# Patient Record
Sex: Female | Born: 1955
Health system: Southern US, Community
[De-identification: ages and names within clinical notes are randomized; demographics above are authoritative.]

## PROBLEM LIST (undated history)

## (undated) DIAGNOSIS — J449 Chronic obstructive pulmonary disease, unspecified: Secondary | ICD-10-CM

## (undated) DIAGNOSIS — F32A Depression, unspecified: Secondary | ICD-10-CM

## (undated) DIAGNOSIS — E538 Deficiency of other specified B group vitamins: Secondary | ICD-10-CM

## (undated) DIAGNOSIS — I10 Essential (primary) hypertension: Secondary | ICD-10-CM

## (undated) DIAGNOSIS — D696 Thrombocytopenia, unspecified: Secondary | ICD-10-CM

## (undated) DIAGNOSIS — I6522 Occlusion and stenosis of left carotid artery: Secondary | ICD-10-CM

## (undated) DIAGNOSIS — I739 Peripheral vascular disease, unspecified: Secondary | ICD-10-CM

## (undated) DIAGNOSIS — K635 Polyp of colon: Secondary | ICD-10-CM

## (undated) DIAGNOSIS — Z720 Tobacco use: Secondary | ICD-10-CM

## (undated) DIAGNOSIS — L409 Psoriasis, unspecified: Secondary | ICD-10-CM

## (undated) DIAGNOSIS — M1990 Unspecified osteoarthritis, unspecified site: Secondary | ICD-10-CM

## (undated) DIAGNOSIS — R0600 Dyspnea, unspecified: Secondary | ICD-10-CM

## (undated) DIAGNOSIS — E785 Hyperlipidemia, unspecified: Secondary | ICD-10-CM

## (undated) DIAGNOSIS — IMO0002 Reserved for concepts with insufficient information to code with codable children: Secondary | ICD-10-CM

## (undated) DIAGNOSIS — R7881 Bacteremia: Secondary | ICD-10-CM

## (undated) DIAGNOSIS — F329 Major depressive disorder, single episode, unspecified: Secondary | ICD-10-CM

## (undated) DIAGNOSIS — M519 Unspecified thoracic, thoracolumbar and lumbosacral intervertebral disc disorder: Secondary | ICD-10-CM

## (undated) HISTORY — DX: Peripheral vascular disease, unspecified: I73.9

## (undated) HISTORY — DX: Hyperlipidemia, unspecified: E78.5

## (undated) HISTORY — DX: Polyp of colon: K63.5

## (undated) HISTORY — DX: Unspecified thoracic, thoracolumbar and lumbosacral intervertebral disc disorder: M51.9

## (undated) HISTORY — DX: Reserved for concepts with insufficient information to code with codable children: IMO0002

## (undated) HISTORY — DX: Major depressive disorder, single episode, unspecified: F32.9

## (undated) HISTORY — DX: Thrombocytopenia, unspecified: D69.6

## (undated) HISTORY — DX: Tobacco use: Z72.0

## (undated) HISTORY — DX: Depression, unspecified: F32.A

## (undated) HISTORY — DX: Essential (primary) hypertension: I10

## (undated) HISTORY — DX: Chronic obstructive pulmonary disease, unspecified: J44.9

## (undated) HISTORY — DX: Deficiency of other specified B group vitamins: E53.8

## (undated) HISTORY — DX: Occlusion and stenosis of left carotid artery: I65.22

## (undated) HISTORY — DX: Morbid (severe) obesity due to excess calories: E66.01

---

## 1980-03-21 HISTORY — PX: CHOLECYSTECTOMY: SHX55

## 1987-03-22 HISTORY — PX: BACK SURGERY: SHX140

## 1999-03-22 HISTORY — PX: HERNIA REPAIR: SHX51

## 1999-06-03 ENCOUNTER — Inpatient Hospital Stay (HOSPITAL_COMMUNITY): Admission: EM | Admit: 1999-06-03 | Discharge: 1999-06-06 | Payer: Self-pay | Admitting: Emergency Medicine

## 1999-06-03 ENCOUNTER — Encounter: Payer: Self-pay | Admitting: Emergency Medicine

## 1999-06-04 ENCOUNTER — Encounter: Payer: Self-pay | Admitting: Surgery

## 1999-06-05 ENCOUNTER — Encounter: Payer: Self-pay | Admitting: Surgery

## 1999-06-14 ENCOUNTER — Encounter: Admission: RE | Admit: 1999-06-14 | Discharge: 1999-09-12 | Payer: Self-pay | Admitting: Family Medicine

## 1999-06-24 ENCOUNTER — Observation Stay (HOSPITAL_COMMUNITY): Admission: RE | Admit: 1999-06-24 | Discharge: 1999-06-27 | Payer: Self-pay | Admitting: Surgery

## 1999-08-24 ENCOUNTER — Inpatient Hospital Stay (HOSPITAL_COMMUNITY): Admission: EM | Admit: 1999-08-24 | Discharge: 1999-08-29 | Payer: Self-pay | Admitting: Surgery

## 1999-10-14 ENCOUNTER — Other Ambulatory Visit: Admission: RE | Admit: 1999-10-14 | Discharge: 1999-10-14 | Payer: Self-pay | Admitting: *Deleted

## 2001-08-17 ENCOUNTER — Other Ambulatory Visit: Admission: RE | Admit: 2001-08-17 | Discharge: 2001-08-17 | Payer: Self-pay | Admitting: *Deleted

## 2002-01-04 ENCOUNTER — Emergency Department (HOSPITAL_COMMUNITY): Admission: EM | Admit: 2002-01-04 | Discharge: 2002-01-04 | Payer: Self-pay | Admitting: Emergency Medicine

## 2002-01-05 ENCOUNTER — Encounter: Payer: Self-pay | Admitting: Emergency Medicine

## 2002-11-01 ENCOUNTER — Other Ambulatory Visit: Admission: RE | Admit: 2002-11-01 | Discharge: 2002-11-01 | Payer: Self-pay | Admitting: Family Medicine

## 2004-02-19 DIAGNOSIS — K635 Polyp of colon: Secondary | ICD-10-CM

## 2004-02-19 HISTORY — DX: Polyp of colon: K63.5

## 2004-02-20 ENCOUNTER — Encounter (INDEPENDENT_AMBULATORY_CARE_PROVIDER_SITE_OTHER): Payer: Self-pay | Admitting: *Deleted

## 2004-02-20 ENCOUNTER — Ambulatory Visit (HOSPITAL_COMMUNITY): Admission: RE | Admit: 2004-02-20 | Discharge: 2004-02-20 | Payer: Self-pay | Admitting: Gastroenterology

## 2004-06-25 ENCOUNTER — Ambulatory Visit (HOSPITAL_COMMUNITY): Admission: RE | Admit: 2004-06-25 | Discharge: 2004-06-25 | Payer: Self-pay | Admitting: Gastroenterology

## 2007-08-04 ENCOUNTER — Inpatient Hospital Stay (HOSPITAL_COMMUNITY): Admission: EM | Admit: 2007-08-04 | Discharge: 2007-08-09 | Payer: Self-pay | Admitting: Emergency Medicine

## 2009-04-21 DIAGNOSIS — I6522 Occlusion and stenosis of left carotid artery: Secondary | ICD-10-CM

## 2009-04-21 HISTORY — DX: Occlusion and stenosis of left carotid artery: I65.22

## 2010-04-26 ENCOUNTER — Ambulatory Visit: Payer: BC Managed Care – PPO | Admitting: Internal Medicine

## 2010-04-26 DIAGNOSIS — IMO0001 Reserved for inherently not codable concepts without codable children: Secondary | ICD-10-CM

## 2010-04-26 DIAGNOSIS — E785 Hyperlipidemia, unspecified: Secondary | ICD-10-CM

## 2010-04-26 DIAGNOSIS — E1165 Type 2 diabetes mellitus with hyperglycemia: Secondary | ICD-10-CM

## 2010-04-26 DIAGNOSIS — F329 Major depressive disorder, single episode, unspecified: Secondary | ICD-10-CM

## 2010-04-26 DIAGNOSIS — I10 Essential (primary) hypertension: Secondary | ICD-10-CM

## 2010-05-11 ENCOUNTER — Ambulatory Visit: Payer: BC Managed Care – PPO | Admitting: Internal Medicine

## 2010-05-12 ENCOUNTER — Encounter: Payer: Self-pay | Admitting: Cardiovascular Disease

## 2010-05-12 ENCOUNTER — Ambulatory Visit (HOSPITAL_COMMUNITY): Payer: BC Managed Care – PPO | Attending: Cardiology

## 2010-05-12 DIAGNOSIS — R609 Edema, unspecified: Secondary | ICD-10-CM

## 2010-05-12 DIAGNOSIS — E785 Hyperlipidemia, unspecified: Secondary | ICD-10-CM | POA: Insufficient documentation

## 2010-05-12 DIAGNOSIS — F17200 Nicotine dependence, unspecified, uncomplicated: Secondary | ICD-10-CM | POA: Insufficient documentation

## 2010-05-12 DIAGNOSIS — I059 Rheumatic mitral valve disease, unspecified: Secondary | ICD-10-CM | POA: Insufficient documentation

## 2010-05-12 DIAGNOSIS — I079 Rheumatic tricuspid valve disease, unspecified: Secondary | ICD-10-CM | POA: Insufficient documentation

## 2010-05-12 DIAGNOSIS — I10 Essential (primary) hypertension: Secondary | ICD-10-CM | POA: Insufficient documentation

## 2010-05-12 DIAGNOSIS — E119 Type 2 diabetes mellitus without complications: Secondary | ICD-10-CM | POA: Insufficient documentation

## 2010-05-12 DIAGNOSIS — E669 Obesity, unspecified: Secondary | ICD-10-CM | POA: Insufficient documentation

## 2010-05-18 ENCOUNTER — Encounter (HOSPITAL_COMMUNITY)
Admission: RE | Admit: 2010-05-18 | Discharge: 2010-05-18 | Disposition: A | Discharge status: Home / Self Care | Payer: BC Managed Care – PPO | Source: Ambulatory Visit | Attending: Obstetrics and Gynecology | Admitting: Obstetrics and Gynecology

## 2010-05-18 DIAGNOSIS — Z01818 Encounter for other preprocedural examination: Secondary | ICD-10-CM | POA: Insufficient documentation

## 2010-05-24 ENCOUNTER — Ambulatory Visit (HOSPITAL_COMMUNITY)
Admission: RE | Admit: 2010-05-24 | Payer: BC Managed Care – PPO | Source: Ambulatory Visit | Admitting: Obstetrics and Gynecology

## 2010-07-28 ENCOUNTER — Encounter: Payer: Self-pay | Admitting: Internal Medicine

## 2010-08-02 ENCOUNTER — Other Ambulatory Visit: Payer: Self-pay | Admitting: Internal Medicine

## 2010-08-02 ENCOUNTER — Ambulatory Visit (INDEPENDENT_AMBULATORY_CARE_PROVIDER_SITE_OTHER): Payer: BC Managed Care – PPO | Admitting: Internal Medicine

## 2010-08-02 ENCOUNTER — Ambulatory Visit (HOSPITAL_BASED_OUTPATIENT_CLINIC_OR_DEPARTMENT_OTHER)
Admission: RE | Admit: 2010-08-02 | Discharge: 2010-08-02 | Disposition: A | Discharge status: Home / Self Care | Payer: BC Managed Care – PPO | Source: Ambulatory Visit | Attending: Internal Medicine | Admitting: Internal Medicine

## 2010-08-02 DIAGNOSIS — Z1231 Encounter for screening mammogram for malignant neoplasm of breast: Secondary | ICD-10-CM

## 2010-08-02 DIAGNOSIS — I10 Essential (primary) hypertension: Secondary | ICD-10-CM

## 2010-08-02 DIAGNOSIS — R609 Edema, unspecified: Secondary | ICD-10-CM

## 2010-08-03 NOTE — H&P (Signed)
[name], [name] NO.:  [id number]   MEDICAL RECORD NO.:  [id number]          PATIENT TYPE:  EMS   LOCATION:  MAJO                         FACILITY:  [facility]   PHYSICIAN:  Lonia Blood, M.D.DATE OF BIRTH:  [birth year]   DATE OF ADMISSION:  08/04/2007  DATE OF DISCHARGE:                              HISTORY & PHYSICAL   PRIMARY CARE PHYSICIAN:  Dr. Talmadge Coventry, M.D.   CHIEF COMPLAINT:  Intractable nausea, dizziness and severe headache.   PRESENT ILLNESS:  [name] is a very pleasant 51-year female  who lives in the [place] area.  She was in her usual state of health  until Thursday evening.  At that time, she developed the acute onset of  a severe generalized pressure-type headache.  This was associated with  symptoms of severe dizziness and intractable nausea.  Since that time,  she has literally not been able to keep down any liquids or solids  whatsoever.  She has had very poor appetite as a result.  There has been  no hematemesis.  There has been very limited vomiting, but the patient  has been so nauseated she has not even attempted to eat or drink.  Coincident to this, she removed to tick from her neck approximately 2-3  hours prior to this symptom onset.  In addition, she had moved a tick  from her scalp approximately 2 weeks prior.  She has been out working in  the yard a good bit recently.  There is no neck stiffness.  There is no  focal neurologic complaints.  The patient does describe some  intermittent paresthesia type sensations in both fingers.  There has  been no diarrhea.  This has been no abdominal pain, chest pain,  shortness of breath.   REVIEW OF SYSTEMS:  Comprehensive review of systems is unremarkable with  exception of multiple positive elements noted in the history present  illness above.   PAST MEDICAL HISTORY:  1. Status post open cholecystectomy with resultant large ventral      hernia repair with  dehiscence of wound.  2. Diabetes mellitus.  3. Hypertension.  4. One pack per day smoking habit since teen years.  5. Obesity.  6. Small internal hemorrhoids and scant diverticular via colonoscopy      2006.  7. Hyperlipidemia.  8. Herniated nucleus pulposus of the lumbar spine status post surgery      greater than 20 years ago.   OUTPATIENT MEDICATIONS:  1. Altace 10 mg daily.  2. Aspirin 325 mg daily.  3. Avandamet 06/998 b.i.d.  4. Folic acid 1 mg daily.  5. HCTZ 25 mg daily.  6. Lantus 36 units q.h.s.  7. Lipitor 20 mg q.h.s.  8. Wellbutrin 300 mg XL daily.  9. Zoloft 150 mg p.o. daily.   ALLERGIES:  PENICILLIN.   FAMILY HISTORY:  Noncontributory.   SOCIAL HISTORY:  The patient does not drink alcohol.  She lives in  [place].  She lives alone.  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   DATA REVIEWED:  White count is normal.  Platelet count is mildly  decreased at 94, hemoglobin is 41 with an MCV of 80.  Sodium is low at  131, chloride, potassium, BUN, creatinine are normal with exception of  mildly elevated BUN and creatinine ratio at 17, serum glucose is  elevated at 139.  Urinalysis is positive with 15 ketones, positive  nitrate, small leukocyte esterase and 0-2 white blood cells.  Influenza  screen was negative in the ER.  Chest x-ray reveals diffuse interstitial  coarsening with no focal infiltrate and confirms a small hiatal hernia.   PHYSICAL EXAMINATION:  VITAL SIGNS:  Temperature 101, blood pressure  147/65, heart rate 107, respiratory 20, O2 saturations 91% on room air.  CBG is 152.  GENERAL:  Obese female in no acute respiratory distress.  HEENT: Normocephalic, atraumatic.  Pupils equal round reactive to light  and accommodation.  Extraocular muscles intact bilaterally.  OC/OP clear  NECK:  No JVD.  No lymphadenopathy.  LUNGS:  Clear to auscultation bilaterally without wheezes or rhonchi.  CARDIOVASCULAR:  Regular rate and rhythm, though  somewhat tachy without  murmur, gallop or rub.  ABDOMEN:  Obese, soft, large ventral hernia with a well-healed very old  scar without evidence of incarceration or erythema or pain, bowel sounds  are positive.  There is no organomegaly appreciable.  There is no  rebound.  The abdomen is soft.  EXTREMITIES:  No significant cyanosis, clubbing, edema, bilateral lower  extremities.  CUTANEOUS:  No apparent erythematous rash is appreciable, and no focal  wounds or cellulitis are appreciated at the general regions, identified  as previous tick bite sites.  NEUROLOGIC:  The patient is alert and oriented x4.  Cranial II-XII are  intact bilaterally.  She displays 5/5 strength bilateral upper and lower  extremities, intact sensation to touch throughout.  There is no  Babinski.   IMPRESSION AND PLAN:  1. Intractable nausea or vomiting with significant dehydration and      generalized severe headache following a tick bite - we must      consider [facility] Spotted Fever within the differential.      Although the patient clinically appears severely dehydrated, she is      otherwise somewhat stable.  Given the fact that she is      significantly nauseated, however, and cannot keep anything down, it      would not be safe to discharge her home on p.o. doxycycline.  As a      result, she is being admitted to the acute units.  She will be      dosed with IV doxycycline.  We will follow her clinically.  RMSF      titers will not be obtained as they are of little utility given the      fact that we are treating her regardless.  2. Urinary tract infection.  It is doubtful that despite her positive      findings on her UTI that this is a severe pyelonephritis.  This is      a possibility, however.  I am less concerned for this, however,      given the fact that there are only 0-2 white blood cells.  We will      send urine for culture and we will adjust antibiotic therapy in      addition to the doxy  as indicated if necessary based upon results      of our cultures.  3. Hyponatremia with severe dehydration.  This  is likely combination      of decreased p.o. intake as well as ongoing HCTZ therapy.  We will      hydrate the patient aggressively given her young age and normal      cardiac function.  4. Diabetes mellitus.  This is somewhat poorly controlled at the      present time.  Will place the patient on dextrose containing IV      fluid and adjust her Lantus to q.12 h dosing.  Will follow her CBG      very closely and make      further adjustments as necessary.  Sliding scale insulin will be      added to her regimen.  5. Tobacco abuse.  I have counseled the patient as to multiple      deleterious effects of ongoing tobacco abuse.  I have advised      simply absence.  She will be provided with a nicotine patch on a      p.r.n. basis.      Lonia Blood, M.D.  Electronically Signed     JTM/MEDQ  D:  08/04/2007  T:  08/04/2007  Job:  [id number]   cc:   Talmadge Coventry, M.D.

## 2010-08-03 NOTE — Discharge Summary (Signed)
[name] [name], [name] [name] NO.:  [id number]   MEDICAL RECORD NO.:  [id number]          PATIENT TYPE:  INP   LOCATION:  5148                         FACILITY:  [facility]   PHYSICIAN:  Lonia Blood, M.D.DATE OF BIRTH:  08-29-1955   DATE OF ADMISSION:  08/04/2007  DATE OF DISCHARGE:  08/09/2007                               DISCHARGE SUMMARY   PRIMARY CARE PHYSICIAN:  Talmadge Coventry, M.D.   DISCHARGE DIAGNOSES:  1. Probable [facility] spotted fever.      a.     Responding to doxycycline.      b.     Acute titer is negative, but convalescent titers are       required to rule out diagnosis.      c.     Clinical picture and tick exposure suggest positive RMSF.  2. Transaminitis.      a.     Normal ultrasound.      b.     Normal viral hepatitis panel.      c.     Questionably secondary to intractable nausea, vomiting and       dehydration.      d.     Resolving.  3. Intractable nausea and vomiting - secondary to above - resolved.  4. Severe dehydration - resolved.  5. Status post ventral hernia repair with postoperative complications      as in history.  6. Diabetes mellitus.  7. Status post open cholecystectomy.  8. Hypertension.  9. Tobacco abuse, about 1 pack per day since teens - abstinence      suggested.  10.Obesity.  11.Small internal hemorrhoids and scattered diverticula via      colonoscopy in 2006.  12.Hyperlipidemia.  13.Herniated nucleus pulposus of the lumbar spine, status post surgery      in the distant past.   DISCHARGE MEDICATIONS:  1. Altace 10 mg p.o. daily.  2. Aspirin 325 mg p.o. daily.  3. Avandamet 06/998 b.i.d.  4. Folic acid 1 mg daily.  5. HCTZ 25 mg daily.  6. Lantus insulin 36 units subcu q.h.s.  7. Lipitor 20 mg p.o. q.h.s.  8. Wellbutrin 300 mg p.o. daily.  9. Zoloft 150 mg p.o. daily.  10.Doxycycline 100 mg p.o. b.i.d. x5 more days, then stop.   FOLLOWUP:  The patient is advised to follow up with Dr. Smith Mince as  previously scheduled.  At that time, the patient's temperature will be  assessed to ensure that she has completely resolved from her symptoms of  [facility] Spotted Fever.  Check of LFTs is also suggested to ensure  the patient's transient transaminitis has completely resolved.   CONSULTATIONS:  None.   PROCEDURES:  1. CT of the head without contrast Aug 07, 2007:  No acute      abnormalities appreciated.  2. Ultrasound of the abdomen Aug 07, 2007:  Hepatic and splenomegaly      but otherwise negative exam.   HOSPITAL COURSE:  [name] is a very pleasant 55 year old  female who resides in the [place] area.  She presented to the  hospital with  complaints of intractable nausea and vomiting on Aug 04, 2007.  She reported a known exposure to ticks on 2 distinct occasions.  One was approximately 48 hours prior to admission, and the other was 2  weeks prior to admission.  Both of these ticks were embedded and had to  be removed.  Her primary symptom was that of nausea, but she also had  complained of generalized weakness, joint aches and severe headache.  Doxycycline was initiated IV on a presumptive diagnosis of RMSF, and the  patient was admitted to the acute unit.  Further evaluation revealed  marked elevation of the transaminases with AST of 313 and ALT of 149.  Ultrasound of the abdomen was carried out.  No parenchymal abnormalities  were appreciated.  A viral hepatitis panel was obtained and was found to  be unremarkable.  Fortunately with hydration the patient's transaminitis  improved.  The patient complained of ongoing generalized headache during  her hospitalization as well.  As a result, CT scan of the head was  carried out, and this was found to be unremarkable.  There was no  appreciable mass.  Fortunately, the patient's headache completely  resolved shortly thereafter as well.  It should be noted that urinalysis  at the time of admission was positive for  leukocyte esterase and nitrite  but only 0 to 2 white blood cells.  Urine culture was obtained and was  unrevealing.  Influenza studies were also obtained and were negative.  With aggressive IV fluid resuscitation and IV doxycycline, the patient's  symptoms improved.  She was able to tolerate a regular diet.  Her fever  resolved.  She suffered no further symptoms of [facility] spotted  fever.  By Aug 09, 2007, the patient was cleared for discharge with the  above recommended followup.      Lonia Blood, M.D.  Electronically Signed     JTM/MEDQ  D:  08/08/2007  T:  08/08/2007  Job:  [id number]   cc:   Talmadge Coventry, M.D.

## 2010-08-06 NOTE — Op Note (Signed)
[facility]  Patient:    [name] [name], [name] [name]                     MRN: [id number] Proc. Date: 08/23/99 Adm. Date:  [id number] Attending:  Abigail Miyamoto A                           Operative Report  PREOPERATIVE DIAGNOSIS:  Recurrent ventral hernia.  POSTOPERATIVE DIAGNOSIS:  Recurrent ventral hernia.  PROCEDURE:  Repair of recurrent ventral hernia and removal of mesh.  SURGEON:  Abigail Miyamoto, M.D.  ASSISTANT:  Zigmund Daniel, M.D.  ANESTHESIA:  General endotracheal anesthesia.  INDICATIONS:  Ms. [name] is a pleasant 55 year old morbidly obese white female who underwent an open ventral hernia repair approximately two months ago with placement of a Gore-Tex dual mesh.  She did well initially but has since had an obvious recurrence and also has had skin breakdown with exposure of the mesh.  Therefore, a decision was made to proceed with open repair and removal of mesh.  DESCRIPTION OF PROCEDURE:  The patient was brought to the operating room, identified as [name].  She was placed upon the operating room table, and then general anesthesia was induced.  Her abdomen was then prepped and draped in the usual sterile fashion.  Using #10 blade, her incision was opened on each side of the open incision.  The incision was carried down to the mesh, which was easily exposed with the electrocautery.  The mesh was then completely excised from surrounding tissue layers with the electrocautery and removed from the field.  The patient was indeed found to have a large recurrence with colon into the large hernia sac.  Using the electrocautery, the adhesions from the colon and omentum were completely excised, along with adhesions to small bowel.  This allowed [place] to identify the fascial defect circumferentially and identify good fascia.  A large amount of redundant omentum was then excised using Kelly clamps and 2-0 silk ties.  The small bowel was  then again examined, and no injury to the intestines was identified where it was taken free from the overlying mesh.  Once all the adhesions to the intestines and omentum were free from the fascia, the abdominal contents were returned into the abdominal cavity.  The fascial defect was then reapproximated with several figure-of-eight #1 Prolene sutures.  The defect was also closed with another running #1 Prolene as well.  The cavity was then thoroughly irrigated with normal saline.  Hemostasis appeared to be achieved. Redundant skin along with the old wound was then removed on the upper and lower half of the incision with a scalpel.  Again hemostasis appeared to be achieved.  Next, a separate skin incision was made and a 38 [place] Blake drain was placed into the wound.  Subcutaneous tissue then reapproximated with interrupted 3-0 Vicryl sutures, and the skin was closed with skin staples. The patient tolerated the procedure well.  All sponge, needle, and instrument counts were correct at the end of the procedure.  The patient was then extubated in the operating room and taken in stable condition to the recovery room. DD:  08/23/99 TD:  08/25/99 Job: [id number]

## 2010-08-06 NOTE — Op Note (Signed)
[name] [name], [name] [name]              ACCOUNT NO.:  [id number]   MEDICAL RECORD NO.:  [id number]          PATIENT TYPE:  AMB   LOCATION:  ENDO                         FACILITY:  [facility]   PHYSICIAN:  Anselmo Rod, M.D.  DATE OF BIRTH:  06/18/1955   DATE OF PROCEDURE:  02/20/2004  DATE OF DISCHARGE:                                 OPERATIVE REPORT   PROCEDURE PERFORMED:  Colonoscopy with biopsies times three (cold biopsies).   ENDOSCOPIST:  Charna Elizabeth, M.D.   INSTRUMENT USED:  Olympus video colonoscope.   INDICATIONS FOR PROCEDURE:  The patient is a 55 year old white female  undergoing a screening colonoscopy.  The patient has a history of adult  onset diabetes mellitus but no known history of colon cancer in her family.  Rule out colonic polyps, masses, etc.   PREPROCEDURE PREPARATION:  Informed consent was procured from the patient.  The patient was fasted for eight hours prior to the procedure and prepped  with a bottle of magnesium citrate and a gallon of GoLYTELY the night prior  to the procedure.       The risks and benefits of the procedure including a  10% miss rate for colon polyps or cancers was discussed with the patient in  great detail.   PREPROCEDURE PHYSICAL:  The patient had stable vital signs.  Neck supple.  Chest clear to auscultation.  S1 and S2 regular.  Abdomen obese with normal  bowel sounds.   DESCRIPTION OF PROCEDURE:  The patient was placed in left lateral decubitus  position and sedated with 80 mg of Demerol and 10 mg of Versed in slow  incremental doses.  Once the patient was adequately sedated and maintained  on low flow oxygen and continuous cardiac monitoring, the Olympus video  colonoscope was advanced from the rectum to the cecum with difficulty.  There was a large amount of residual stool in the colon and multiple washes  were done.  A small sessile polyp was biopsied from the rectum.  There was  no evidence of diverticulosis.  No large masses or  polyps were seen.  Retroflexion in the rectum revealed small internal hemorrhoids.   IMPRESSION:  1.  Small internal hemorrhoids.  2.  Small sessile polyps biopsied from the anal verge.  3.  Large amount of residual stool in the colon.  Small lesions could have      been missed.   RECOMMENDATIONS:  1.  Await pathology results.  2.  Outpatient followup in the next two weeks.  3.  Reprep and redo the procedure at a later date.      Jyot   JNM/MEDQ  D:  02/20/2004  T:  02/21/2004  Job:  [id number]   cc:   Talmadge Coventry, M.D.  [address]  [place]  [place] [address]  Fax: [phone]

## 2010-08-06 NOTE — H&P (Signed)
[facility]  Patient:    KEITH, CANCIO                     MRN: [id number] Adm. Date:  [id number] Disc. Date: [id number] Attending:  Abigail Miyamoto A                         History and Physical  HISTORY:  Ms. Reuben Likes is a 55 year old female who is approximately one month status post a ventral hernia repair with mesh.  She has since had wound breakdown.  We have been treating this with wet-to-dry dressing changes. However, she now has exposed mesh and decision was made to proceed to the operating room for mesh removal.  The patient has been afebrile and has been on antibiotics.  PAST MEDICAL HISTORY:  Remarkable for adult-onset diabetes.  PAST SURGICAL HISTORY:  Ventral hernia repair with mesh, as well as an open cholecystectomy.  She also has hypertension.  SOCIAL HISTORY:  She smokes approximately a pack of cigarettes a day.  She does not drink alcohol.  PHYSICAL EXAMINATION:  GENERAL:  Morbidly obese female in no acute distress.  HEENT:  She is anicteric.  Oropharynx clear.  NECK:  Supple.  LUNGS:  Clear to auscultation bilaterally.  CARDIOVASCULAR:  Regular rate and rhythm.  ABDOMEN:  Obese.  There is a large incision in the right upper quadrant, subcostal, with skin breakdown and exposed mesh.  The abdomen is nontender.  EXTREMITIES:  Warm and well perfused.  IMPRESSION:  Patient with ventral and now exposed mesh and probable mesh infection, who is now here for exploration with mesh removal and primary repair of her hernia. DD:  09/23/99 TD:  09/23/99 Job: [id number]

## 2010-08-06 NOTE — Op Note (Signed)
[name] [name], [name] [name]              ACCOUNT NO.:  [id number]   MEDICAL RECORD NO.:  [id number]          PATIENT TYPE:  AMB   LOCATION:  ENDO                         FACILITY:  [facility]   PHYSICIAN:  Anselmo Rod, M.D.  DATE OF BIRTH:  [birth year]   DATE OF PROCEDURE:  06/25/2004  DATE OF DISCHARGE:                                 OPERATIVE REPORT   PROCEDURE PERFORMED:  Screening colonoscopy.   ENDOSCOPIST:  Charna Elizabeth, M.D.   INSTRUMENT USED:  Olympus video colonoscope.   INDICATIONS FOR PROCEDURE:  The patient is a 55 year old white female with a  history of diabetes undergoing screening colonoscopy to rule out colonic  polyps, masses, etc.   PREPROCEDURE PREPARATION:  Informed consent was procured from the patient.  The patient was fasted for eight hours prior to the procedure and prepped  with a bottle of magnesium citrate and a gallon of GoLYTELY the night prior  to the procedure.  The risks and benefits of the procedure including a 10%  miss rate for colon polyps or cancers was discussed with her as well.   PREPROCEDURE PHYSICAL:  The patient had stable vital signs.  Neck supple.  Chest clear to auscultation.  S1 and S2 regular.  Abdomen soft with normal  bowel sounds.   DESCRIPTION OF PROCEDURE:  The patient was placed in left lateral decubitus  position and sedated with 100 mg of Demerol and 10 mg of Versed in slow  incremental doses.  Once the patient was adequately sedated and maintained  on low flow oxygen and continuous cardiac monitoring, the Olympus video  colonoscope was advanced from the rectum to the cecum.  The appendicular  orifice and ileocecal valve were clearly visualized and photographed.  No  masses, polyps, erosions, or ulcerations were noted.  There was evidence of  sigmoid diverticulosis.  There were a few scattered diverticula seen in the  left colon.  Small internal hemorrhoids were appreciated on retroflexion in  the rectum.  The patient tolerated  the procedure well without complication.   IMPRESSION:  1.  Small nonbleeding internal hemorrhoids.  2.  Few scattered sigmoid diverticula in various stages of formation.  3.  No masses or polyps seen.   RECOMMENDATIONS:  1.  Repeat colonoscopy is recommended in the next 10 years unless the      patient develops any abnormal symptoms      in the interim.  2.  Outpatient followup as need arises in the future.  3.  Importance of a high fiber diet and brochures on diverticulosis have      been to the patient for her education.      JNM/MEDQ  D:  06/25/2004  T:  06/25/2004  Job:  [id number]   cc:   Talmadge Coventry, M.D.  [address]  [place]  [place] [address]  Fax: [phone]

## 2010-08-06 NOTE — Discharge Summary (Signed)
[facility]  Patient:    [name] [name], [name] [name]                     MRN: [id number] Adm. Date:  [id number] Disc. Date: [id number] Attending:  Abigail Miyamoto A                           Discharge Summary  HISTORY OF PRESENT ILLNESS:  [name] [name] is a 55 year old morbidly obese female who is approximately one month status post a ventral hernia repair with mesh who is now admitted for skin breakdown exposed mesh and for mesh removal.  HOSPITAL COURSE:  The patient was admitted on August 24, 1999 and taken to the Operating Room where she underwent a primary ventral hernia repair with removal of her old Gore-Tex mesh.  The patient tolerated the procedure well and was taken in stable condition to the surgical floor for both IV pain medication as well as IV antibiotics.  She was left with one Jackson-Pratt drain in place.  By postoperative day #1 she was doing well and was slightly more comfortable.  Her Jackson-Pratt drain was draining out serosanguineous fluid.  She was continued on pulmonary toilet and began to ambulate.  By postoperative day #2 she was still having some heartburn and nausea and a temperature of 101.5; however, this decreased with pulmonary toilet.  The patient continued to improve slowly over the next several days and her IV antibiotics and pain medicines were slowly switched to oral.  By postoperative day #6 she was doing much better.  She was tolerating her diet.  She was tolerating oral pain medications.  Her incisions were healing well.  Decision was made to discharge the patient home.  DISCHARGE DIAGNOSIS:  Ventral hernia status post primary repair with removal of mesh.  DISCHARGE DIET:  Regular.  DISCHARGE ACTIVITY:  She is to do no heavy lifting.  She may shower.  She will record her Jackson-Pratt drain output.  DISCHARGE MEDICATIONS:  She will resume her home medications.  She will also take OxyContin and Tylox for pain.  DISCHARGE  FOLLOW-UP:  She will follow up at my office in one week post discharge. DD:  09/23/99 TD:  09/23/99 Job: [id number]

## 2010-11-15 ENCOUNTER — Ambulatory Visit: Payer: BC Managed Care – PPO | Admitting: Internal Medicine

## 2010-11-23 ENCOUNTER — Ambulatory Visit (HOSPITAL_BASED_OUTPATIENT_CLINIC_OR_DEPARTMENT_OTHER)
Admission: RE | Admit: 2010-11-23 | Discharge: 2010-11-23 | Disposition: A | Discharge status: Home / Self Care | Payer: BC Managed Care – PPO | Source: Ambulatory Visit | Attending: Internal Medicine | Admitting: Internal Medicine

## 2010-11-23 ENCOUNTER — Encounter: Payer: Self-pay | Admitting: Internal Medicine

## 2010-11-23 ENCOUNTER — Ambulatory Visit (INDEPENDENT_AMBULATORY_CARE_PROVIDER_SITE_OTHER): Payer: BC Managed Care – PPO | Admitting: Internal Medicine

## 2010-11-23 VITALS — BP 149/77 | HR 77 | Temp 97.4°F | Resp 16 | Ht 67.5 in | Wt 276.0 lb

## 2010-11-23 DIAGNOSIS — E785 Hyperlipidemia, unspecified: Secondary | ICD-10-CM | POA: Insufficient documentation

## 2010-11-23 DIAGNOSIS — J45909 Unspecified asthma, uncomplicated: Secondary | ICD-10-CM | POA: Insufficient documentation

## 2010-11-23 DIAGNOSIS — F17200 Nicotine dependence, unspecified, uncomplicated: Secondary | ICD-10-CM

## 2010-11-23 DIAGNOSIS — J40 Bronchitis, not specified as acute or chronic: Secondary | ICD-10-CM

## 2010-11-23 DIAGNOSIS — J9801 Acute bronchospasm: Secondary | ICD-10-CM

## 2010-11-23 DIAGNOSIS — F329 Major depressive disorder, single episode, unspecified: Secondary | ICD-10-CM | POA: Insufficient documentation

## 2010-11-23 DIAGNOSIS — E1151 Type 2 diabetes mellitus with diabetic peripheral angiopathy without gangrene: Secondary | ICD-10-CM | POA: Insufficient documentation

## 2010-11-23 DIAGNOSIS — M519 Unspecified thoracic, thoracolumbar and lumbosacral intervertebral disc disorder: Secondary | ICD-10-CM

## 2010-11-23 DIAGNOSIS — R05 Cough: Secondary | ICD-10-CM | POA: Insufficient documentation

## 2010-11-23 DIAGNOSIS — R059 Cough, unspecified: Secondary | ICD-10-CM

## 2010-11-23 DIAGNOSIS — Z720 Tobacco use: Secondary | ICD-10-CM | POA: Insufficient documentation

## 2010-11-23 DIAGNOSIS — J309 Allergic rhinitis, unspecified: Secondary | ICD-10-CM | POA: Insufficient documentation

## 2010-11-23 DIAGNOSIS — R0989 Other specified symptoms and signs involving the circulatory and respiratory systems: Secondary | ICD-10-CM

## 2010-11-23 DIAGNOSIS — D696 Thrombocytopenia, unspecified: Secondary | ICD-10-CM | POA: Insufficient documentation

## 2010-11-23 DIAGNOSIS — F32A Depression, unspecified: Secondary | ICD-10-CM | POA: Insufficient documentation

## 2010-11-23 DIAGNOSIS — E538 Deficiency of other specified B group vitamins: Secondary | ICD-10-CM | POA: Insufficient documentation

## 2010-11-23 DIAGNOSIS — I10 Essential (primary) hypertension: Secondary | ICD-10-CM

## 2010-11-23 DIAGNOSIS — K635 Polyp of colon: Secondary | ICD-10-CM | POA: Insufficient documentation

## 2010-11-23 DIAGNOSIS — J209 Acute bronchitis, unspecified: Secondary | ICD-10-CM

## 2010-11-23 MED ORDER — PREDNISONE 20 MG PO TABS: ORAL_TABLET | ORAL | Status: DC

## 2010-11-23 MED ORDER — ALBUTEROL 90 MCG/ACT IN AERS: 2.0000 | INHALATION_SPRAY | Freq: Four times a day (QID) | RESPIRATORY_TRACT | Status: DC | PRN

## 2010-11-23 MED ORDER — METHYLPREDNISOLONE ACETATE 80 MG/ML IJ SUSP
80.0000 mg | Freq: Once | INTRAMUSCULAR | Status: AC
  Administered 2010-11-23: 80 mg via INTRAMUSCULAR

## 2010-11-23 MED ORDER — ALBUTEROL SULFATE (5 MG/ML) 0.5% IN NEBU
2.5000 mg | INHALATION_SOLUTION | RESPIRATORY_TRACT | Status: DC
  Administered 2010-11-23: 2.5 mg via RESPIRATORY_TRACT

## 2010-11-23 NOTE — Progress Notes (Signed)
Subjective:    Patient ID: [name] [name], female    DOB: 01-04-1956, 55 y.o.   MRN: [id number]  HPI  [name] [name] has spent the last 3 days in [place] taking care of mother with advanced dementia.  She has been under quite a lot of stress and has been smoking quite a bit.  She is wheezing with a dry cough "for the past month".  No chest pain.  Just drove to [place] from [place] this morning and is quite fatigued.  She has not had her ventolin inhaler in quite some time now.  She wakes up at night coughing.  She has not had a CXR in quite some time.    Recent visit with Dr. Talmage Nap with a good AIC report.  She is tolerating Janumet well  Allergies  Allergen Reactions  . Iohexol      Desc: ANAPHYLACTIC REACTION   . Penicillins Rash  . Sulfa Antibiotics Rash   Past Medical History  Diagnosis Date  . Diabetes mellitus   . Hypertension   . Hyperlipidemia   . Depression   . Colon polyps 12/05  . Thrombocytopenia   . Carotid stenosis, left 04/2009    50-69%  . Allergic rhinitis   . Asthma   . Folate deficiency   . Obesity, morbid (more than 100 lbs over ideal weight or BMI > 40)    Past Surgical History  Procedure Date  . Back surgery 1989    LS  . Cholecystectomy 1982  . Hernia repair 2001    incisional   History   Social History  . Marital Status: Divorced    Spouse Name: N/A    Number of Children: 2  . Years of Education: HS grad   Occupational History  . QUALITY MANAGER    Social History Main Topics  . Smoking status: Current Everyday Smoker -- 1.0 packs/day for 37 years    Types: Cigarettes  . Smokeless tobacco: Never Used  . Alcohol Use: No  . Drug Use: No  . Sexually Active: Not Currently -- Female partner(s)    Birth Control/ Protection: IUD   Other Topics Concern  . Not on file   Social History Narrative  . No narrative on file   Family History  Problem Relation Age of Onset  . Asthma Mother   . Hypertension Mother   . Stroke Father   . Alcohol  abuse Father   . Suicidality Sister   . Suicidality Brother   . Dementia Sister    There is no problem list on file for this patient.  Current Outpatient Prescriptions on File Prior to Visit  Medication Sig Dispense Refill  . aspirin 81 MG EC tablet Take 81 mg by mouth daily.        Marland Kitchen atorvastatin (LIPITOR) 20 MG tablet Take 20 mg by mouth daily.        Marland Kitchen buPROPion (WELLBUTRIN XL) 300 MG 24 hr tablet Take 300 mg by mouth daily.        . Cholecalciferol (VITAMIN D3) 1000 UNITS CAPS Take 1 capsule by mouth 2 (two) times daily.        . folic acid (FOLVITE) 1 MG tablet Take 1 mg by mouth daily.        . hydrochlorothiazide 25 MG tablet Take 25 mg by mouth daily.        . ramipril (ALTACE) 10 MG capsule Take 10 mg by mouth 2 (two) times daily.        Marland Kitchen  sitaGLIPtan-metformin (JANUMET) 50-1000 MG per tablet Take 1 tablet by mouth 2 (two) times daily with a meal.        . Flaxseed, Linseed, (FLAX SEED OIL) 1000 MG CAPS Take 1 capsule by mouth daily.         No current facility-administered medications on file prior to visit.        Review of Systems See HPI    Objective:   Physical Exam Peak flow 310  Physical Exam  Nursing note and vitals reviewed.  Constitutional: She is oriented to person, place, and time. She appears well-developed and well-nourished.  HENT:  Head: Normocephalic and atraumatic.  Cardiovascular: Normal rate and regular rhythm. Exam reveals no gallop and no friction rub.  No murmur heard.  Pulmonary/Chest: Diffuse expiratory wheezing throughout both lung fields  Neurological: She is alert and oriented to person, place, and time.  Skin: Skin is warm and dry.  Psychiatric: She has a normal mood and affect. Her behavior is norma       Assessment & Plan:  1)  Acute bronchospasm - asthma vs COPD.  Will give Depomedrol 80 mg IM in office along with Albuterol nebulizer.                  CXR today shows bronchitic changes.  Prednisone 60 mg taper every 3 days.   Recheck in 48 hours  Will also give Ventolin HFA tid-qid until I recheck her.  2)  HTN:  Elevated today but pt did not take medication and she is very stressed today.  3) DM  Followed by endocrinologist and apparently doing well  4)  L carotid bruit  :  She declined U/S last visit will discuss in 2 days.  5)  Tobacco Use :  Counseled on smoking cessation

## 2010-11-23 NOTE — Patient Instructions (Signed)
Take Ventolin 2 puffs 3 or 4 times a day.  Take prednisone as prescribed  See me in office in 48 hours

## 2010-11-25 ENCOUNTER — Ambulatory Visit (INDEPENDENT_AMBULATORY_CARE_PROVIDER_SITE_OTHER): Payer: BC Managed Care – PPO | Admitting: Internal Medicine

## 2010-11-25 ENCOUNTER — Encounter: Payer: Self-pay | Admitting: Internal Medicine

## 2010-11-25 DIAGNOSIS — I10 Essential (primary) hypertension: Secondary | ICD-10-CM

## 2010-11-25 DIAGNOSIS — F17200 Nicotine dependence, unspecified, uncomplicated: Secondary | ICD-10-CM

## 2010-11-25 DIAGNOSIS — J45909 Unspecified asthma, uncomplicated: Secondary | ICD-10-CM

## 2010-11-25 MED ORDER — FLUTICASONE-SALMETEROL 100-50 MCG/DOSE IN AEPB: 1.0000 | INHALATION_SPRAY | Freq: Two times a day (BID) | RESPIRATORY_TRACT | Status: DC

## 2010-11-25 MED ORDER — AZITHROMYCIN 250 MG PO TABS: ORAL_TABLET | ORAL | Status: AC

## 2010-11-25 MED ORDER — METHYLPREDNISOLONE ACETATE 80 MG/ML IJ SUSP
80.0000 mg | Freq: Once | INTRAMUSCULAR | Status: AC
  Administered 2010-11-25: 80 mg via INTRAMUSCULAR

## 2010-11-25 NOTE — Progress Notes (Signed)
Subjective:    Patient ID: [name] [name], female    DOB: 14-Aug-1955, 55 y.o.   MRN: [id number]  HPI  [name] [name] is here for follow up of asthma exacerbation.  She is coughing up thick white mucous.  No fever or chest pain.  She is breathing easier able to take a deep breathe.  Using Ventolin 4 times a day.  Able to sleep at night with less coughing  CXR shows perbronchial thickening.  Reviewed with pt  Allergies  Allergen Reactions  . Iohexol      Desc: ANAPHYLACTIC REACTION   . Penicillins Rash  . Sulfa Antibiotics Rash   Past Medical History  Diagnosis Date  . Colon polyps 12/05  . Thrombocytopenia     work up with Dr. Arlice Colt neg in 2000  . Carotid stenosis, left 04/2009    50-69%  . Folate deficiency   . Obesity, morbid (more than 100 lbs over ideal weight or BMI > 40)   . Allergic rhinitis   . Asthma   . Depression   . Diabetes mellitus     followed by Dr. Talmage Nap  . Hyperlipidemia   . Hypertension   . Tobacco abuse   . Lumbar disc disease     h/o hnp with repair   Past Surgical History  Procedure Date  . Back surgery 1989    LS  for HNP  . Cholecystectomy 1982  . Hernia repair 2001    incisional   History   Social History  . Marital Status: Divorced    Spouse Name: N/A    Number of Children: 2  . Years of Education: HS grad   Occupational History  . QUALITY MANAGER    Social History Main Topics  . Smoking status: Current Everyday Smoker -- 1.0 packs/day for 37 years    Types: Cigarettes  . Smokeless tobacco: Never Used  . Alcohol Use: No  . Drug Use: No  . Sexually Active: Not Currently -- Female partner(s)    Birth Control/ Protection: IUD   Other Topics Concern  . Not on file   Social History Narrative  . No narrative on file   Family History  Problem Relation Age of Onset  . Asthma Mother   . Hypertension Mother   . Stroke Father   . Alcohol abuse Father   . Suicidality Sister   . Suicidality Brother   . Dementia Sister    Patient  Active Problem List  Diagnoses  . Allergic rhinitis  . Asthma  . Depression  . Diabetes mellitus  . Hyperlipidemia  . Hypertension  . Colon polyps  . Folate deficiency  . Thrombocytopenia  . Obesity, morbid (more than 100 lbs over ideal weight or BMI > 40)  . Carotid stenosis, left  . Tobacco abuse  . Lumbar disc disease   Current Outpatient Prescriptions on File Prior to Visit  Medication Sig Dispense Refill  . albuterol (PROVENTIL,VENTOLIN) 90 MCG/ACT inhaler Inhale 2 puffs into the lungs every 6 (six) hours as needed for wheezing.  17 g  12  . aspirin 81 MG EC tablet Take 81 mg by mouth daily.        Marland Kitchen atorvastatin (LIPITOR) 20 MG tablet Take 20 mg by mouth daily.        Marland Kitchen buPROPion (WELLBUTRIN XL) 300 MG 24 hr tablet Take 300 mg by mouth daily.        . Cholecalciferol (VITAMIN D3) 1000 UNITS CAPS Take 1 capsule by mouth 2 (  two) times daily.        . Flaxseed, Linseed, (FLAX SEED OIL) 1000 MG CAPS Take 1 capsule by mouth daily.        . folic acid (FOLVITE) 1 MG tablet Take 1 mg by mouth daily.        . hydrochlorothiazide 25 MG tablet Take 25 mg by mouth daily.        . predniSONE (DELTASONE) 20 MG tablet Take 3 tabs daily  for 3 days, then 2 tabs daily for 3 days then 1 tab daily for 3 days then stop  18 tablet  0  . ramipril (ALTACE) 10 MG capsule Take 10 mg by mouth 2 (two) times daily.        . sitaGLIPtan-metformin (JANUMET) 50-1000 MG per tablet Take 1 tablet by mouth 2 (two) times daily with a meal.         Current Facility-Administered Medications on File Prior to Visit  Medication Dose Route Frequency Provider Last Rate Last Dose  . DISCONTD: albuterol (PROVENTIL) (5 MG/ML) 0.5% nebulizer solution 2.5 mg  2.5 mg Nebulization Q4H Levon Hedger, MD   2.5 mg at 11/23/10 4098       Review of Systems See HPI    Objective:   Physical Exam  Physical Exam  Nursing note and vitals reviewed.   Peak flow 330 Constitutional: She is oriented to person, place, and  time. She appears well-developed and well-nourished.  HENT:  Head: Normocephalic and atraumatic.  Cardiovascular: Normal rate and regular rhythm. Exam reveals no gallop and no friction rub.  No murmur heard.  Pulmonary/Chest: Breath sounds normal. Few end exp wheezing today.  Better air flow  Neurological: She is alert and oriented to person, place, and time.  Skin: Skin is warm and dry.  Psychiatric: She has a normal mood and affect. Her behavior is normal.       Assessment & Plan:  1)  Asthma likely early COPD  Will add Advair 100/50 bid and OK to taper down the Ventilin after 3-4 days of Advair.         Depomedrol 80 mg in office today.  Will also give Zpack 2) HTN improved today 3)  Tobacco use  Will discuss again in October  Return 4-6 week or prn

## 2010-11-25 NOTE — Patient Instructions (Signed)
Take advair  One inhalation twice a day  Cut down on Ventolin after 3-4 days of advair  See me 4-6 weeks

## 2010-12-15 ENCOUNTER — Other Ambulatory Visit: Payer: Self-pay | Admitting: Emergency Medicine

## 2010-12-15 DIAGNOSIS — E785 Hyperlipidemia, unspecified: Secondary | ICD-10-CM

## 2010-12-15 DIAGNOSIS — I10 Essential (primary) hypertension: Secondary | ICD-10-CM

## 2010-12-15 DIAGNOSIS — F3341 Major depressive disorder, recurrent, in partial remission: Secondary | ICD-10-CM

## 2010-12-15 LAB — URINALYSIS, ROUTINE W REFLEX MICROSCOPIC
Glucose, UA: NEGATIVE
Glucose, UA: NEGATIVE
Hgb urine dipstick: NEGATIVE
Hgb urine dipstick: NEGATIVE
Ketones, ur: NEGATIVE
Protein, ur: NEGATIVE
pH: 6
pH: 7

## 2010-12-15 LAB — HEPATIC FUNCTION PANEL
ALT: 76 — ABNORMAL HIGH
Bilirubin, Direct: 0.4 — ABNORMAL HIGH
Indirect Bilirubin: 0.8
Total Protein: 5.6 — ABNORMAL LOW

## 2010-12-15 LAB — CBC
HCT: 32.1 — ABNORMAL LOW
HCT: 42
Hemoglobin: 10.8 — ABNORMAL LOW
Hemoglobin: 14.1
MCHC: 34.9
MCV: 79.7
Platelets: 70 — ABNORMAL LOW
RBC: 4.68
RDW: 15.7 — ABNORMAL HIGH
RDW: 16.3 — ABNORMAL HIGH
WBC: 4.1
WBC: 7.3

## 2010-12-15 LAB — DIFFERENTIAL
Basophils Absolute: 0
Eosinophils Relative: 0
Lymphocytes Relative: 10 — ABNORMAL LOW
Lymphs Abs: 0.8
Monocytes Absolute: 0.4
Neutro Abs: 6.2

## 2010-12-15 LAB — COMPREHENSIVE METABOLIC PANEL
ALT: 92 — ABNORMAL HIGH
AST: 313 — ABNORMAL HIGH
Alkaline Phosphatase: 71
BUN: 7
CO2: 29
CO2: 30
Calcium: 8.6
Chloride: 100
Creatinine, Ser: 0.75
GFR calc Af Amer: >60
GFR calc non Af Amer: >60
GFR calc non Af Amer: >60
Glucose, Bld: 160 — ABNORMAL HIGH
Potassium: 3.9
Sodium: 135
Total Bilirubin: 0.9
Total Protein: 5.5 — ABNORMAL LOW
Total Protein: 5.8 — ABNORMAL LOW

## 2010-12-15 LAB — POCT I-STAT, CHEM 8
BUN: 17
Chloride: 94 — ABNORMAL LOW
Creatinine, Ser: 1
Sodium: 131 — ABNORMAL LOW
TCO2: 28

## 2010-12-15 LAB — BASIC METABOLIC PANEL
BUN: 5 — ABNORMAL LOW
BUN: 8
CO2: 29
Chloride: 104
Chloride: 95 — ABNORMAL LOW
GFR calc Af Amer: >60
GFR calc non Af Amer: >60
Glucose, Bld: 153 — ABNORMAL HIGH
Potassium: 3.8
Potassium: 3.9

## 2010-12-15 LAB — URINE CULTURE
Colony Count: NO GROWTH
Special Requests: NEGATIVE

## 2010-12-15 LAB — LIPID PANEL
Cholesterol: 91
LDL Cholesterol: 49
Total CHOL/HDL Ratio: 3.8
Triglycerides: 90

## 2010-12-15 LAB — INFLUENZA A+B VIRUS AG-DIRECT(RAPID)

## 2010-12-15 LAB — URINE MICROSCOPIC-ADD ON

## 2010-12-15 LAB — B. BURGDORFI ANTIBODIES: B burgdorferi Ab IgG+IgM: 0.03

## 2010-12-15 LAB — HEMOGLOBIN A1C
Hgb A1c MFr Bld: 6.9 — ABNORMAL HIGH
Mean Plasma Glucose: 168

## 2010-12-15 LAB — GLUCOSE, RANDOM: Glucose, Bld: 178 — ABNORMAL HIGH

## 2010-12-15 LAB — ROCKY MTN SPOTTED FVR AB, IGG-BLOOD: RMSF IgG: <1:64 {titer}

## 2010-12-15 LAB — HEPATITIS PANEL, ACUTE: Hep A IgM: NEGATIVE

## 2010-12-15 LAB — ROCKY MTN SPOTTED FVR AB, IGM-BLOOD: RMSF IgM: 0.07 IV

## 2010-12-15 NOTE — Telephone Encounter (Signed)
[facility] called, requesting refills of medication to mail order pharmacy.  90 day supply of maintenance medications.    FYI- atorvastatin is not preferred on her plan.  Preferred medications are fluvastatin, lovastatin or pravastatin

## 2010-12-16 MED ORDER — FLUTICASONE-SALMETEROL 100-50 MCG/DOSE IN AEPB: 1.0000 | INHALATION_SPRAY | Freq: Two times a day (BID) | RESPIRATORY_TRACT | Status: AC

## 2010-12-16 MED ORDER — RAMIPRIL 10 MG PO CAPS: 10.0000 mg | ORAL_CAPSULE | Freq: Two times a day (BID) | ORAL | Status: DC

## 2010-12-16 MED ORDER — HYDROCHLOROTHIAZIDE 25 MG PO TABS: 25.0000 mg | ORAL_TABLET | Freq: Every day | ORAL | Status: DC

## 2010-12-16 MED ORDER — BUPROPION HCL ER (XL) 300 MG PO TB24: 300.0000 mg | ORAL_TABLET | Freq: Every day | ORAL | Status: DC

## 2010-12-16 MED ORDER — ATORVASTATIN CALCIUM 20 MG PO TABS: 20.0000 mg | ORAL_TABLET | Freq: Every day | ORAL | Status: DC

## 2011-01-03 ENCOUNTER — Encounter: Payer: Self-pay | Admitting: Internal Medicine

## 2011-01-03 ENCOUNTER — Ambulatory Visit (INDEPENDENT_AMBULATORY_CARE_PROVIDER_SITE_OTHER): Payer: BC Managed Care – PPO | Admitting: Internal Medicine

## 2011-01-03 VITALS — BP 142/70 | HR 89 | Temp 97.7°F | Resp 12 | Ht 67.5 in | Wt 274.0 lb

## 2011-01-03 DIAGNOSIS — J40 Bronchitis, not specified as acute or chronic: Secondary | ICD-10-CM

## 2011-01-03 DIAGNOSIS — Z23 Encounter for immunization: Secondary | ICD-10-CM

## 2011-01-03 DIAGNOSIS — J45909 Unspecified asthma, uncomplicated: Secondary | ICD-10-CM

## 2011-01-03 MED ORDER — METHYLPREDNISOLONE ACETATE 80 MG/ML IJ SUSP
120.0000 mg | Freq: Once | INTRAMUSCULAR | Status: AC
  Administered 2011-01-03: 120 mg via INTRAMUSCULAR

## 2011-01-03 MED ORDER — AZITHROMYCIN 250 MG PO TABS: ORAL_TABLET | ORAL | Status: AC

## 2011-01-03 NOTE — Progress Notes (Signed)
Addended by: Chip Boer on: 01/03/2011 09:48 AM   Modules accepted: Orders

## 2011-01-03 NOTE — Patient Instructions (Signed)
Take meds as prescribed.  Continue advair bid

## 2011-01-03 NOTE — Progress Notes (Signed)
Subjective:    Patient ID: [name] [name], female    DOB: 1956/02/18, 55 y.o.   MRN: [id number]  HPI  [name] [name] is here for follow up af asthma exacerbation.  Doing better but continues to smoke.  Cough productive of white/yellow mucous.  No fever or chest pain.  [place]  She is still travelling there twice a month to see mother with very advanced Alzheimers.    Peak flow to day  320  Allergies  Allergen Reactions  . Iohexol      Desc: ANAPHYLACTIC REACTION   . Penicillins Rash  . Sulfa Antibiotics Rash   Past Medical History  Diagnosis Date  . Colon polyps 12/05  . Thrombocytopenia     work up with Dr. Arlice Colt neg in 2000  . Carotid stenosis, left 04/2009    50-69%  . Folate deficiency   . Obesity, morbid (more than 100 lbs over ideal weight or BMI > 40)   . Allergic rhinitis   . Asthma   . Depression   . Diabetes mellitus     followed by Dr. Talmage Nap  . Hyperlipidemia   . Hypertension   . Tobacco abuse   . Lumbar disc disease     h/o hnp with repair   Past Surgical History  Procedure Date  . Back surgery 1989    LS  for HNP  . Cholecystectomy 1982  . Hernia repair 2001    incisional   History   Social History  . Marital Status: Divorced    Spouse Name: N/A    Number of Children: 2  . Years of Education: HS grad   Occupational History  . QUALITY MANAGER    Social History Main Topics  . Smoking status: Current Everyday Smoker -- 1.0 packs/day for 37 years    Types: Cigarettes  . Smokeless tobacco: Never Used  . Alcohol Use: No  . Drug Use: No  . Sexually Active: Not Currently -- Female partner(s)    Birth Control/ Protection: IUD   Other Topics Concern  . Not on file   Social History Narrative  . No narrative on file   Family History  Problem Relation Age of Onset  . Asthma Mother   . Hypertension Mother   . Stroke Father   . Alcohol abuse Father   . Suicidality Sister   . Suicidality Brother   . Dementia Sister     Patient Active Problem List  Diagnoses  . Allergic rhinitis  . Asthma  . Depression  . Diabetes mellitus  . Hyperlipidemia  . Hypertension  . Colon polyps  . Folate deficiency  . Thrombocytopenia  . Obesity, morbid (more than 100 lbs over ideal weight or BMI > 40)  . Carotid stenosis, left  . Tobacco abuse  . Lumbar disc disease   Current Outpatient Prescriptions on File Prior to Visit  Medication Sig Dispense Refill  . albuterol (PROVENTIL,VENTOLIN) 90 MCG/ACT inhaler Inhale 2 puffs into the lungs every 6 (six) hours as needed for wheezing.  17 g  12  . aspirin 81 MG EC tablet Take 81 mg by mouth daily.        Marland Kitchen atorvastatin (LIPITOR) 20 MG tablet Take 1 tablet (20 mg total) by mouth daily.  90 tablet  1  . buPROPion (WELLBUTRIN XL) 300 MG 24 hr tablet Take 1 tablet (300 mg total) by mouth daily.  90 tablet  1  . Cholecalciferol (VITAMIN D3) 1000 UNITS CAPS  Take 1 capsule by mouth 2 (two) times daily.        . Flaxseed, Linseed, (FLAX SEED OIL) 1000 MG CAPS Take 1 capsule by mouth daily.        . folic acid (FOLVITE) 1 MG tablet Take 1 mg by mouth daily.        . hydrochlorothiazide (HYDRODIURIL) 25 MG tablet Take 1 tablet (25 mg total) by mouth daily.  90 tablet  1  . predniSONE (DELTASONE) 20 MG tablet Take 3 tabs daily  for 3 days, then 2 tabs daily for 3 days then 1 tab daily for 3 days then stop  18 tablet  0  . ramipril (ALTACE) 10 MG capsule Take 1 capsule (10 mg total) by mouth 2 (two) times daily.  180 capsule  1  . sitaGLIPtan-metformin (JANUMET) 50-1000 MG per tablet Take 1 tablet by mouth 2 (two) times daily with a meal.        . Fluticasone-Salmeterol (ADVAIR DISKUS) 100-50 MCG/DOSE AEPB Inhale 1 puff into the lungs 2 (two) times daily.  3 each  1        Review of Systems    See HPI Objective:   Physical Exam  Physical Exam  Nursing note and vitals reviewed.  Constitutional: She is oriented to person, place, and time. She appears well-developed and  well-nourished.  HENT:  Head: Normocephalic and atraumatic.  Cardiovascular: Normal rate and regular rhythm. Exam reveals no gallop and no friction rub.  No murmur heard.  Pulmonary/Chest: Breath sounds normal. She has  End expiratory wheezing bilaterally. She has no rales.  Neurological: She is alert and oriented to person, place, and time.  Skin: Skin is warm and dry.  Psychiatric: She has a normal mood and affect. Her behavior is normal.        Assessment & Plan:  1)  Asthmatic bronchitis  Will give Z pack today and Depomedrol 120 mg IM.  She continues to smoke which will challenge her recovery.  2)  Tobacco Use.  She would like to try smoking cessation but still travelling with ill mother twice a month.  When stresses less, will try cessation 3)  DM  Dr. Talmage Nap  Pt counseled to expect sugars to be ellevated  Will give influenza today.  She had pneumovax with Dr. Steele Sizer  Return prn

## 2011-01-11 ENCOUNTER — Encounter: Payer: Self-pay | Admitting: Internal Medicine

## 2011-01-11 ENCOUNTER — Emergency Department (HOSPITAL_COMMUNITY)
Admission: EM | Admit: 2011-01-11 | Discharge: 2011-01-11 | Disposition: A | Discharge status: Home / Self Care | Payer: BC Managed Care – PPO | Attending: Emergency Medicine | Admitting: Emergency Medicine

## 2011-01-11 ENCOUNTER — Ambulatory Visit (INDEPENDENT_AMBULATORY_CARE_PROVIDER_SITE_OTHER): Payer: BC Managed Care – PPO | Admitting: Internal Medicine

## 2011-01-11 ENCOUNTER — Ambulatory Visit (HOSPITAL_BASED_OUTPATIENT_CLINIC_OR_DEPARTMENT_OTHER)
Admission: RE | Admit: 2011-01-11 | Discharge: 2011-01-11 | Disposition: A | Discharge status: Home / Self Care | Payer: BC Managed Care – PPO | Source: Ambulatory Visit | Attending: Internal Medicine | Admitting: Internal Medicine

## 2011-01-11 DIAGNOSIS — R229 Localized swelling, mass and lump, unspecified: Secondary | ICD-10-CM | POA: Insufficient documentation

## 2011-01-11 DIAGNOSIS — K439 Ventral hernia without obstruction or gangrene: Secondary | ICD-10-CM | POA: Insufficient documentation

## 2011-01-11 DIAGNOSIS — L02219 Cutaneous abscess of trunk, unspecified: Secondary | ICD-10-CM

## 2011-01-11 DIAGNOSIS — R109 Unspecified abdominal pain: Secondary | ICD-10-CM

## 2011-01-11 DIAGNOSIS — K469 Unspecified abdominal hernia without obstruction or gangrene: Secondary | ICD-10-CM

## 2011-01-11 DIAGNOSIS — L0291 Cutaneous abscess, unspecified: Secondary | ICD-10-CM

## 2011-01-11 DIAGNOSIS — E7800 Pure hypercholesterolemia, unspecified: Secondary | ICD-10-CM | POA: Insufficient documentation

## 2011-01-11 DIAGNOSIS — L03319 Cellulitis of trunk, unspecified: Secondary | ICD-10-CM | POA: Insufficient documentation

## 2011-01-11 DIAGNOSIS — I10 Essential (primary) hypertension: Secondary | ICD-10-CM | POA: Insufficient documentation

## 2011-01-11 DIAGNOSIS — Z9889 Other specified postprocedural states: Secondary | ICD-10-CM | POA: Insufficient documentation

## 2011-01-11 DIAGNOSIS — E119 Type 2 diabetes mellitus without complications: Secondary | ICD-10-CM | POA: Insufficient documentation

## 2011-01-11 HISTORY — PX: INCISE AND DRAIN ABCESS: PRO64

## 2011-01-11 MED ORDER — CIPROFLOXACIN HCL 500 MG PO TABS: 500.0000 mg | ORAL_TABLET | Freq: Two times a day (BID) | ORAL | Status: AC

## 2011-01-11 MED ORDER — METRONIDAZOLE 500 MG PO TABS: 500.0000 mg | ORAL_TABLET | Freq: Three times a day (TID) | ORAL | Status: AC

## 2011-01-11 NOTE — Progress Notes (Signed)
  Subjective:    Patient ID: [name] [name], female    DOB: 08-17-1955, 55 y.o.   MRN: [id number]  HPI    Review of Systems     Objective:   Physical Exam        Assessment & Plan:  Abd wall abscess  Spoke with Dr. Marlyne Beards of [facility]  CT shows abscess but not sure if communicates with abd wall cavity.  Pt will meet Dr. Marlyne Beards at [facility] ER for further evaluation.  I spoke with pt wt 2:20 pm and she voices understanding and will go to ER.  I informed ER triage nurse at [facility]

## 2011-01-11 NOTE — Progress Notes (Signed)
Subjective:    Patient ID: [name] [name], female    DOB: 08-18-1955, 55 y.o.   MRN: [id number]  HPI  [name] [name] is here for an acute problem.  She has a long history of abd wall hernia S/P mesh repair and she reports mesh was removed around 2001.  She presents with reddened swollen mass on L side  abd for 5 days.  She also reports that on R side of abd she noted that one of the former sutures of wire mesh was "poking through my skin".  She tried to cut it off with a sterilized tweezers at home approx 4 weeks ago.  She denies fever but describes a "tearing sensation"  In upper epigastric area.   She denies fever, appetite change, LBM reported this am as "normal' by pt.  Allergies  Allergen Reactions  . Iohexol      Desc: ANAPHYLACTIC REACTION   . Penicillins Rash  . Sulfa Antibiotics Rash   Past Medical History  Diagnosis Date  . Colon polyps 12/05  . Thrombocytopenia     work up with Dr. Arlice [name] neg in 2000  . Carotid stenosis, left 04/2009    50-69%  . Folate deficiency   . Obesity, morbid (more than 100 lbs over ideal weight or BMI > 40)   . Allergic rhinitis   . Asthma   . Depression   . Diabetes mellitus     followed by Dr. Talmage [name]  . Hyperlipidemia   . Hypertension   . Tobacco abuse   . Lumbar disc disease     h/o hnp with repair   Past Surgical History  Procedure Date  . Back surgery 1989    LS  for HNP  . Cholecystectomy 1982  . Hernia repair 2001    incisional   History   Social History  . Marital Status: Divorced    Spouse Name: N/A    Number of Children: 2  . Years of Education: HS grad   Occupational History  . QUALITY MANAGER    Social History Main Topics  . Smoking status: Current Everyday Smoker -- 1.0 packs/day for 37 years    Types: Cigarettes  . Smokeless tobacco: Never Used  . Alcohol Use: No  . Drug Use: No  . Sexually Active: Not Currently -- Female partner(s)    Birth Control/ Protection: IUD   Other Topics Concern  . Not on file    Social History Narrative  . No narrative on file   Family History  Problem Relation Age of Onset  . Asthma Mother   . Hypertension Mother   . Stroke Father   . Alcohol abuse Father   . Suicidality Sister   . Suicidality Brother   . Dementia Sister    Patient Active Problem List  Diagnoses  . Allergic rhinitis  . Asthma  . Depression  . Diabetes mellitus  . Hyperlipidemia  . Hypertension  . Colon polyps  . Folate deficiency  . Thrombocytopenia  . Obesity, morbid (more than 100 lbs over ideal weight or BMI > 40)  . Carotid stenosis, left  . Tobacco abuse  . Lumbar disc disease   Current Outpatient Prescriptions on File Prior to Visit  Medication Sig Dispense Refill  . albuterol (PROVENTIL,VENTOLIN) 90 MCG/ACT inhaler Inhale 2 puffs into the lungs every 6 (six) hours as needed for wheezing.  17 g  12  . aspirin 81 MG EC tablet Take 81 mg by mouth daily.        Marland Kitchen  atorvastatin (LIPITOR) 20 MG tablet Take 1 tablet (20 mg total) by mouth daily.  90 tablet  1  . buPROPion (WELLBUTRIN XL) 300 MG 24 hr tablet Take 1 tablet (300 mg total) by mouth daily.  90 tablet  1  . Cholecalciferol (VITAMIN D3) 1000 UNITS CAPS Take 1 capsule by mouth 2 (two) times daily.        . Flaxseed, Linseed, (FLAX SEED OIL) 1000 MG CAPS Take 1 capsule by mouth daily.        . Fluticasone-Salmeterol (ADVAIR DISKUS) 100-50 MCG/DOSE AEPB Inhale 1 puff into the lungs 2 (two) times daily.  3 each  1  . folic acid (FOLVITE) 1 MG tablet Take 1 mg by mouth daily.        . hydrochlorothiazide (HYDRODIURIL) 25 MG tablet Take 1 tablet (25 mg total) by mouth daily.  90 tablet  1  . ramipril (ALTACE) 10 MG capsule Take 1 capsule (10 mg total) by mouth 2 (two) times daily.  180 capsule  1  . sitaGLIPtan-metformin (JANUMET) 50-1000 MG per tablet Take 1 tablet by mouth 2 (two) times daily with a meal.             Review of Systems See HPI   Objective:   Physical Exam Physical Exam  Nursing note and vitals  reviewed.  Constitutional: She is oriented to person, place, and time. She appears well-developed and well-nourished.  HENT:  Head: Normocephalic and atraumatic.  Cardiovascular: Normal rate and regular rhythm. Exam reveals no gallop and no friction rub.  No murmur heard.  Pulmonary/Chest: Breath sounds normal. She has no wheezes. She has no rales.  ABD:  BS positive.  Large ventral hernia present. In area of prior suture or mesh, skin is discolored and reddened.   She has approx 3 cm erythematous mass abd wall on L side. Neurological: She is alert and oriented to person, place, and time.  Skin: Skin is warm and dry.  Psychiatric: She has a normal mood and affect. Her behavior is normal.         Assessment & Plan:  1)  Abd mass r/o deep abscess:  She is allergic to IV Dye,  Will get oral contrast CT today and further treatment depending on results.  NO obvious signs of obstruction or peritonitis on exam today.   Will Rx cipro 500 mg bid and Flagyl 500 mg tid.  If no bowel involvement will have surgery evaluate for possible I and D. 2)  Abd wall hernia 3)  Epigastric pain

## 2011-01-11 NOTE — Patient Instructions (Signed)
To have CT now  Further treatment based on results  TAke the two antibiotics as prescribed  Xray will call me with results

## 2011-01-12 ENCOUNTER — Encounter (INDEPENDENT_AMBULATORY_CARE_PROVIDER_SITE_OTHER): Payer: Self-pay | Admitting: Surgery

## 2011-01-12 ENCOUNTER — Ambulatory Visit (INDEPENDENT_AMBULATORY_CARE_PROVIDER_SITE_OTHER): Payer: BC Managed Care – PPO | Admitting: Surgery

## 2011-01-12 VITALS — BP 154/80 | HR 88 | Temp 96.9°F | Resp 12 | Ht 68.0 in | Wt 274.6 lb

## 2011-01-12 DIAGNOSIS — L02219 Cutaneous abscess of trunk, unspecified: Secondary | ICD-10-CM

## 2011-01-12 DIAGNOSIS — L03319 Cellulitis of trunk, unspecified: Secondary | ICD-10-CM

## 2011-01-12 DIAGNOSIS — L02211 Cutaneous abscess of abdominal wall: Secondary | ICD-10-CM | POA: Insufficient documentation

## 2011-01-12 NOTE — Patient Instructions (Signed)
Continue your antibiotics we will change the packing on Friday when you come back to the office

## 2011-01-12 NOTE — Progress Notes (Signed)
Chief complaint followup after her abdominal wall abscess drainage  History of present illness: The patient presented yesterday to the emergency room and had a anterior abdominal wall abscess drained by the PA. She was told to come back to our office for dressing change today. She feels better today.  Exam: Abdomen is basically soft and benign. There is benign the side in the left upper abdomen laterally gigantic incisional hernia. There is no surrounding cellulitis. I removed the packing and it is not appear to be a very deep cavity.  Impression: Status post I&D of anterior abdominal wall abscess  Plan: She is already on antibiotics and a culture is pending. We repacked today. She'll come back for a followup three pack in 48 hours.

## 2011-01-14 ENCOUNTER — Ambulatory Visit (INDEPENDENT_AMBULATORY_CARE_PROVIDER_SITE_OTHER): Payer: BC Managed Care – PPO | Admitting: General Surgery

## 2011-01-14 DIAGNOSIS — Z5189 Encounter for other specified aftercare: Secondary | ICD-10-CM

## 2011-01-14 NOTE — Patient Instructions (Signed)
Remove ribbon gauze in 2 days and keep area covered with bandage. Call if area feels red or hot. Return to see Dr Jamey Ripa in 3 weeks for wound check.

## 2011-01-14 NOTE — Progress Notes (Signed)
Patient comes in today status post drainage of left upper quadrant abdominal wall abscess with dressing change by Dr Jamey Ripa. In today for a wound check and dressing change. Iodoform was removed. Wound is clean. Wound repacked with 1/4 inch iodoform and covered with a dry gauze. Patient instructed to remove iodoform in 2 days and keep wound covered with a dry gauze until it heals. Will return for a wound check in 3 weeks. Patient instructed to call if area becomes hot or red or if she starts running a fever. Patient agreed with this plan.

## 2011-01-18 LAB — WOUND CULTURE

## 2011-01-18 NOTE — Consult Note (Signed)
NAMEMARIACELESTE, HERRERA NO.:  [id number]  MEDICAL RECORD NO.:  [id number]  LOCATION:                               FACILITY:  [facility]  PHYSICIAN:  Angelia Mould. Derrell Lolling, M.D.DATE OF BIRTH:  17-Jan-1956  DATE OF CONSULTATION: DATE OF DISCHARGE:                                CONSULTATION   REFERRING PHYSICIAN:  Kendrick Ranch, M.D.  PRIMARY ENDOCRINOLOGIST:  Dorisann Frames, M.D.  REASON FOR CONSULT:  Abdominal abscess.  BRIEF HISTORY:  The patient is a 55 year old white female who presented to Dr. Constance Goltz today with a reddened area on the left side of her abdomen.  She states that it started about 5 days ago.  Initially, it was red and rather concave, but it has become somewhat more indurated and convex.  She has not had any fever.  It is somewhat tender when she moves or to touch.  She has had no fever.  No nausea or vomiting.  No diarrhea or constipation.  Dr. Constance Goltz obtained a CT today, which was done without contrast.  It shows the ventral hernia with  large and small bowel present, without obstruction.  There was a fluid collection on the left side, going to the skin surface and to the ventral wall fascia.  They could not tell if this was infected or not.  The area was 4.9 x 2.6 cm.  Dr. Constance Goltz contacted [place] and she was ultimately referred to the emergency room at [facility] for evaluation.  She also notes that she had an area on the right side of her abdomen, which is about 1.5 to 2 cm in diameter that had some strings coming out, which she clipped off with some toenail clippers about a month and half ago.  PAST MEDICAL HISTORY: 1. Asthma. 2. Hypertension. 3. Adult-onset diabetes mellitus, well controlled. 4. Depression. 5. Dyslipidemia. 6. Tobacco use.  PAST SURGICAL HISTORY: 1. Recurrent ventral hernia with ventral hernia repair and removal of     mesh on August 23, 1999. 2. Prior ventral hernia repair with Gore-Tex in April 2001, both  by     Dr. Abigail Miyamoto. 3. Prior cholecystectomy with wound dehiscence in 1981. 4. Spinal surgery in 1989.  FAMILY HISTORY:  Mother is living at 76 and in poor health.  Father died at 15 with a stroke.  One brother is deceased from suicide.  She has 6 sisters, 2 of whom are dead, 1 from suicide and 1 with an MVA.  The other 4 are in good health.  SOCIAL HISTORY:  Positive for tobacco, a pack a day for 40 years. Alcohol, none.  Drugs, none.  She is working same job at [employer] facility for 30 years.  She is divorced.  REVIEW OF SYSTEMS:  Fever: None.  Skin:  Changes at 2 sites on her abdomen, 1 with strings sticking out which was clipped off.  The 2nd one which she was sent here for.  Weight:  She has been down about 3 pounds in the last couple weeks but she is on a diet.  CV:  No history of headache, syncope, or stroke.  PULMONARY:  Positive for bronchitis.  She has had  pneumonia some years ago, but not currently.  She does wheeze. GI:  Negative for GERD.  No nausea, vomiting, diarrhea, constipation, or blood.  GU:  Negative.  LOWER EXTREMITIES:  No edema or claudication. She is having some trouble with numbness in her left leg.  CURRENT MEDICATIONS:  She is on, 1. Advair inhaler b.i.d. 2. Albuterol p.r.n. 3. Hydrochlorothiazide 25 mg daily. 4. Lipitor 20 mg daily. 5. Ramipril 10 mg b.i.d. 6. Wellbutrin 300 mg daily. 7. Cipro 500 mg b.i.d., she filled the prescription today. 8. Flagyl 500 mg t.i.d. also filled today.  ALLERGIES:  SULFA, PENICILLIN, and IODINE-BASED DYES.  PHYSICAL EXAMINATION:  GENERAL:  This is a well-nourished, overweight white female, in no acute distress. VITAL SIGNS: Temperature is 98, blood pressure was 166/90, heart rate was 95, respiratory rate was 20, sats are 96% on room air. HEAD:  Normocephalic.  Ears, nose, throat, and mouth are within normal limits. NECK:  Trachea is in midline.  She has a left carotid bruit. CHEST:  Shows  bilateral wheezing, otherwise clear.  Nontender, CARDIAC:  Normal S1 and S2.  No murmurs or rubs.  Pulses are +2 and equal in upper and lower extremities. ABDOMEN:  Bowel sounds are present.  She is not distended.  She is not tender.  She has a large ventral hernia, and prior incisions on the right lower quadrant of her abdomen.  She has a 1.5 cm area on the right side, which is where she clipped off the sutures, its kind of ecchymotic in color, but it is nontender and there is no fluctuance.  On the left side, she has a 2.5 cm area which is raised and indurated.  The midportion is about 1.5 cm in diameter and this is somewhat hard.  There are no masses.  She does have a ventral hernia which is soft and reducible. GU/RECTAL:  Deferred. LYMPHADENOPATHY:  None palpated. MUSCULOSKELETAL:  Normal. SKIN:  Changes as above. NEUROLOGIC:  No focal deficits. PSYCH: Normal affect.  IMPRESSION: 1. Abdominal abscess with fluid collection by CT between the skin and     the fascia. 2. Adult-onset diabetes mellitus. 3. Asthma/chronic obstructive pulmonary disease/tobacco use. 4. Hypertension. 5. Dyslipidemia. 6. History of depression.  PLAN:  Dr. Derrell Lolling has personally seen and examined the patient, and it was his recommendation that we do an incision and drainage right here, pack it with iodoform. Dr. Derrell Lolling discussed this with the patient, outlining risks and complications, as well as discussing the likelyhood of achieving the goal of resolution of thre soft tissue infection.  A 0.5-inch incision was made over the midportion using 2% Xylocaine with epinephrine as a block, a total of 10 cc was used.  Purulent drainage was obtained.  This was thoroughly opened and drained as well as we could and then it was packed with iodoform. Culture was obtained.  A dry sterile dressing was then applied.  We plan to send the patient home tonight.  She has prescriptions for Flagyl 500 mg q.8 and Cipro 500 mg  q.12.  She is going to start that tonight.  I gave her a prescription for Percocet 5/325 1-2 p.o. q.4 p.r.n.  She is to call if she has any problems and we will follow up in the office in the next 24- 48 hours.     Eber Hong, P.A.   ______________________________ Angelia Mould. Derrell Lolling, M.D.    WDJ/MEDQ  D:  01/11/2011  T:  01/11/2011  Job:  [id number]  cc:  Kendrick Ranch, M.D.  Dorisann Frames, M.D. Fax: [phone]  Electronically Signed by Sherrie George P.A. on 01/13/2011 09:03:50 PM Electronically Signed by Claud Kelp M.D. on 01/18/2011 03:59:52 PM

## 2011-02-04 ENCOUNTER — Ambulatory Visit (INDEPENDENT_AMBULATORY_CARE_PROVIDER_SITE_OTHER): Payer: BC Managed Care – PPO | Admitting: Surgery

## 2011-02-04 ENCOUNTER — Encounter (INDEPENDENT_AMBULATORY_CARE_PROVIDER_SITE_OTHER): Payer: Self-pay | Admitting: Surgery

## 2011-02-04 VITALS — BP 136/84 | HR 84 | Temp 97.5°F | Resp 20 | Ht 68.0 in | Wt 276.4 lb

## 2011-02-04 DIAGNOSIS — L02219 Cutaneous abscess of trunk, unspecified: Secondary | ICD-10-CM

## 2011-02-04 DIAGNOSIS — L03319 Cellulitis of trunk, unspecified: Secondary | ICD-10-CM

## 2011-02-04 DIAGNOSIS — L02211 Cutaneous abscess of abdominal wall: Secondary | ICD-10-CM

## 2011-02-04 NOTE — Patient Instructions (Signed)
We will see you again on an as needed basis. Please call the office at [phone] if you have any questions or concerns. Thank you for allowing [place] to take care of you. The mesh used in your original repair was Gore-Tex dual mesh

## 2011-02-04 NOTE — Progress Notes (Signed)
Chief complaint followup after her abdominal wall abscess drainage  History of present illness: The patient presented to the emergency room and had a anterior abdominal wall abscess drained by the PA. She has been seen in the office X 2 since and comes back for final visit. She is doing well  Exam: Abdomen is basically soft and benign. The abscess is healed. Impression: Status post I&D of anterior abdominal wall abscess  Plan: Will see back PRN. Counseled re smoking cessation

## 2011-06-01 ENCOUNTER — Other Ambulatory Visit: Payer: Self-pay | Admitting: Internal Medicine

## 2011-06-02 NOTE — Telephone Encounter (Signed)
Please call [name] [name] and have her see me in office sometime in next 2 months.  She is overdue for diabetes labs and office visit as well.   I will refill meds for 2 months but she needs to schedule visit

## 2011-10-05 ENCOUNTER — Other Ambulatory Visit: Payer: Self-pay | Admitting: Internal Medicine

## 2011-10-06 ENCOUNTER — Telehealth: Payer: Self-pay | Admitting: Internal Medicine

## 2011-10-06 NOTE — Telephone Encounter (Signed)
Lora please call  Pt and let her know that I only refillled 60 days of meds.  Give her an appt to see me as I have not seen since December 2012.  Give 30 min appt and message backe with date  Thanks

## 2011-10-10 ENCOUNTER — Encounter: Payer: Self-pay | Admitting: *Deleted

## 2012-01-09 ENCOUNTER — Other Ambulatory Visit: Payer: Self-pay | Admitting: Internal Medicine

## 2012-01-11 NOTE — Telephone Encounter (Signed)
Bobbie  Call pt and let her know that I can refill meds for 30 days.  I have not seen her in one year - she needs to have appt with me sometime in the next month  Meds are being sent to [facility] which usually likes a 90 day supply.  If she wishes to send somewhere else for 30 days, she can give an alternate pharmacy and I will send it there.

## 2012-01-13 NOTE — Telephone Encounter (Signed)
Called pt and left message awaiting return c all

## 2012-01-19 ENCOUNTER — Ambulatory Visit (INDEPENDENT_AMBULATORY_CARE_PROVIDER_SITE_OTHER): Payer: BC Managed Care – PPO | Admitting: Internal Medicine

## 2012-01-19 ENCOUNTER — Encounter: Payer: Self-pay | Admitting: Internal Medicine

## 2012-01-19 VITALS — BP 138/88 | HR 102 | Temp 97.0°F | Resp 22 | Ht 68.0 in | Wt 272.1 lb

## 2012-01-19 DIAGNOSIS — F17200 Nicotine dependence, unspecified, uncomplicated: Secondary | ICD-10-CM

## 2012-01-19 DIAGNOSIS — Z720 Tobacco use: Secondary | ICD-10-CM

## 2012-01-19 DIAGNOSIS — F329 Major depressive disorder, single episode, unspecified: Secondary | ICD-10-CM

## 2012-01-19 DIAGNOSIS — E785 Hyperlipidemia, unspecified: Secondary | ICD-10-CM

## 2012-01-19 DIAGNOSIS — F32A Depression, unspecified: Secondary | ICD-10-CM

## 2012-01-19 DIAGNOSIS — Z23 Encounter for immunization: Secondary | ICD-10-CM

## 2012-01-19 DIAGNOSIS — I10 Essential (primary) hypertension: Secondary | ICD-10-CM

## 2012-01-19 LAB — CBC WITH DIFFERENTIAL/PLATELET
Basophils Absolute: 0 10*3/uL (ref 0.0–0.1)
Basophils Relative: 0 % (ref 0–1)
Lymphocytes Relative: 29 % (ref 12–46)
MCHC: 33.5 g/dL (ref 30.0–36.0)
Neutro Abs: 4.4 10*3/uL (ref 1.7–7.7)
Neutrophils Relative %: 61 % (ref 43–77)
RDW: 15.4 % (ref 11.5–15.5)
WBC: 7.3 10*3/uL (ref 4.0–10.5)

## 2012-01-19 LAB — COMPREHENSIVE METABOLIC PANEL
ALT: 29 U/L (ref 0–35)
AST: 22 U/L (ref 0–37)
Albumin: 4.3 g/dL (ref 3.5–5.2)
Alkaline Phosphatase: 76 U/L (ref 39–117)
Chloride: 98 mEq/L (ref 96–112)
Potassium: 5 mEq/L (ref 3.5–5.3)
Sodium: 138 mEq/L (ref 135–145)
Total Protein: 6.9 g/dL (ref 6.0–8.3)

## 2012-01-19 LAB — TSH: TSH: 2.453 u[IU]/mL (ref 0.350–4.500)

## 2012-01-19 LAB — LIPID PANEL
HDL: 48 mg/dL (ref >39)
LDL Cholesterol: 133 mg/dL — ABNORMAL HIGH (ref 0–99)

## 2012-01-19 MED ORDER — HYDROCHLOROTHIAZIDE 25 MG PO TABS: 25.0000 mg | ORAL_TABLET | Freq: Every day | ORAL | Status: DC

## 2012-01-19 MED ORDER — SERTRALINE HCL 50 MG PO TABS: ORAL_TABLET | ORAL | Status: DC

## 2012-01-19 MED ORDER — RAMIPRIL 10 MG PO CAPS: 10.0000 mg | ORAL_CAPSULE | Freq: Two times a day (BID) | ORAL | Status: DC

## 2012-01-19 NOTE — Progress Notes (Signed)
Subjective:    Patient ID: [name] [name], female    DOB: 03/07/1956, 56 y.o.   MRN: [id number]  HPI  [name] is here for follow up.   She is tearful and crying the entire interview.  Mother died in October 27, 2022 very stressful relationship with daughter who blames pt for "everything"  Grand-daughter has been sick and her daughter is not forthcoming with medical problems with grand=daughter.  Pt's daughter had addiction to pills in the past.  Taking meds for BP without problems.    Diabetes managed by Dr. Talmage Nap.  Pt reports she is off Januvia now  Depressed mood most days, she denies suicidal thoughts or plans,  No S/H ideation no psychotic features.   Has been on Wellbutrin and Zoloft in the past.    Allergies  Allergen Reactions  . Iohexol      Desc: ANAPHYLACTIC REACTION   . Penicillins Rash  . Sulfa Antibiotics Rash   Past Medical History  Diagnosis Date  . Colon polyps 12/05  . Thrombocytopenia     work up with Dr. Arlice Colt neg in 2000  . Carotid stenosis, left 04/2009    50-69%  . Folate deficiency   . Obesity, morbid (more than 100 lbs over ideal weight or BMI > 40)   . Allergic rhinitis   . Asthma   . Depression   . Diabetes mellitus     followed by Dr. Talmage Nap  . Hyperlipidemia   . Hypertension   . Tobacco abuse   . Lumbar disc disease     h/o hnp with repair  . Abdominal abscess    Past Surgical History  Procedure Date  . Back surgery 1989    LS  for HNP  . Cholecystectomy 1982  . Hernia repair 2001    incisional  . Incise and drain abcess 01/11/11    abd abscess   History   Social History  . Marital Status: Divorced    Spouse Name: N/A    Number of Children: 2  . Years of Education: HS grad   Occupational History  . QUALITY MANAGER    Social History Main Topics  . Smoking status: Current Every Day Smoker -- 1.0 packs/day for 37 years    Types: Cigarettes  . Smokeless tobacco: Never Used  . Alcohol Use: No  . Drug Use: No  . Sexually Active: Not  Currently -- Female partner(s)    Birth Control/ Protection: IUD   Other Topics Concern  . Not on file   Social History Narrative  . No narrative on file   Family History  Problem Relation Age of Onset  . Asthma Mother   . Hypertension Mother   . Stroke Father   . Alcohol abuse Father   . Suicidality Sister   . Cancer Sister     breast  . Suicidality Brother   . Dementia Sister   . Cancer Sister     brain stem tumor   Patient Active Problem List  Diagnosis  . Allergic rhinitis  . Asthma  . Depression  . Diabetes mellitus  . Hyperlipidemia  . Hypertension  . Colon polyps  . Folate deficiency  . Thrombocytopenia  . Obesity, morbid (more than 100 lbs over ideal weight or BMI > 40)  . Carotid stenosis, left  . Tobacco abuse  . Lumbar disc disease  . Abdominal wall abscess   Current Outpatient Prescriptions on File Prior to Visit  Medication Sig Dispense Refill  . aspirin 81 MG  EC tablet Take 81 mg by mouth daily.        Marland Kitchen atorvastatin (LIPITOR) 20 MG tablet TAKE 1 TABLET BY MOUTH EVERY DAY  30 tablet  0  . buPROPion (WELLBUTRIN XL) 300 MG 24 hr tablet TAKE 1 TABLET BY MOUTH EVERY DAY  60 tablet  0  . Cholecalciferol (VITAMIN D3) 1000 UNITS CAPS Take 1 capsule by mouth 2 (two) times daily.        . folic acid (FOLVITE) 1 MG tablet Take 1 mg by mouth daily.        Marland Kitchen glimepiride (AMARYL) 1 MG tablet Take 1 mg by mouth daily before breakfast.      . metFORMIN (GLUMETZA) 1000 MG (MOD) 24 hr tablet Take 1,000 mg by mouth 2 (two) times daily with a meal.      . DISCONTD: hydrochlorothiazide (HYDRODIURIL) 25 MG tablet Take 1 tablet (25 mg total) by mouth daily.  90 tablet  1  . DISCONTD: ramipril (ALTACE) 10 MG capsule TAKE 1 CAPSULE BY MOUTH TWICE DAILY  60 capsule  0  . albuterol (PROVENTIL,VENTOLIN) 90 MCG/ACT inhaler Inhale 2 puffs into the lungs every 6 (six) hours as needed for wheezing.  17 g  12  . sertraline (ZOLOFT) 50 MG tablet Take one tablet at hs for one week then  two tablets daily  60 tablet  3  . sitaGLIPtan-metformin (JANUMET) 50-1000 MG per tablet Take 1 tablet by mouth 2 (two) times daily with a meal.        . DISCONTD: hydrochlorothiazide (HYDRODIURIL) 25 MG tablet TAKE 1 TABLET DAILY  60 tablet  0  . DISCONTD: hydrochlorothiazide (HYDRODIURIL) 25 MG tablet TAKE 1 TABLET DAILY  30 tablet  0      Review of Systems See HPI    Objective:   Physical Exam Physical Exam  Nursing note and vitals reviewed.  Constitutional: She is oriented to person, place, and time. She appears well-developed and well-nourished.  HENT:  Head: Normocephalic and atraumatic.  Cardiovascular: Normal rate and regular rhythm. Exam reveals no gallop and no friction rub.  No murmur heard.  Pulmonary/Chest: Breath sounds normal. She has no wheezes. She has no rales.  Neurological: She is alert and oriented to person, place, and time.  Skin: Skin is warm and dry.  Psychiatric: She has a normal mood and affect. Her behavior is normal.          Assessment & Plan:  Depression:  Depression screen positive.  Will initiate Zoloft 50 mg daily for 7 days the 100 mg daily.  I gave numbers to two therapists Jeannie Done and Vonna Kotyk for pt to start therapy.    HTN  Will check labs and refill meds today  Hyperlipidemia  Check today  Tobacco use  Not intereste in cessation  See me in 6 weeks or sooner prn

## 2012-01-19 NOTE — Patient Instructions (Addendum)
See me in 4-6 weeks

## 2012-01-23 ENCOUNTER — Telehealth: Payer: Self-pay | Admitting: *Deleted

## 2012-01-23 ENCOUNTER — Encounter: Payer: Self-pay | Admitting: *Deleted

## 2012-01-23 NOTE — Telephone Encounter (Signed)
Message copied by Mathews Robinsons on Mon Jan 23, 2012  2:41 PM ------      Message from: Raechel Chute D      Created: Fri Jan 20, 2012  5:10 PM       Ok to mail to pt

## 2012-01-23 NOTE — Telephone Encounter (Signed)
Lab results mailed to home address.

## 2012-02-13 ENCOUNTER — Other Ambulatory Visit: Payer: Self-pay | Admitting: Internal Medicine

## 2012-02-13 NOTE — Telephone Encounter (Signed)
Needs refill

## 2012-03-02 ENCOUNTER — Telehealth: Payer: Self-pay | Admitting: *Deleted

## 2012-03-02 NOTE — Telephone Encounter (Signed)
This pt states that she continues to get a bill from 10/31 visit when she called insurance insurance states that  The claim was not filed

## 2012-03-06 ENCOUNTER — Encounter: Payer: Self-pay | Admitting: Internal Medicine

## 2012-03-06 ENCOUNTER — Ambulatory Visit (INDEPENDENT_AMBULATORY_CARE_PROVIDER_SITE_OTHER): Payer: BC Managed Care – PPO | Admitting: Internal Medicine

## 2012-03-06 VITALS — BP 136/72 | HR 89 | Temp 97.0°F | Resp 22 | Wt 278.8 lb

## 2012-03-06 DIAGNOSIS — F329 Major depressive disorder, single episode, unspecified: Secondary | ICD-10-CM

## 2012-03-06 DIAGNOSIS — I10 Essential (primary) hypertension: Secondary | ICD-10-CM

## 2012-03-06 DIAGNOSIS — J45909 Unspecified asthma, uncomplicated: Secondary | ICD-10-CM

## 2012-03-06 DIAGNOSIS — E785 Hyperlipidemia, unspecified: Secondary | ICD-10-CM

## 2012-03-06 DIAGNOSIS — F17200 Nicotine dependence, unspecified, uncomplicated: Secondary | ICD-10-CM

## 2012-03-06 DIAGNOSIS — F32A Depression, unspecified: Secondary | ICD-10-CM

## 2012-03-06 DIAGNOSIS — Z720 Tobacco use: Secondary | ICD-10-CM

## 2012-03-06 MED ORDER — SERTRALINE HCL 100 MG PO TABS: ORAL_TABLET | ORAL | Status: DC

## 2012-03-06 NOTE — Progress Notes (Signed)
Subjective:    Patient ID: [name] [name], female    DOB: June 23, 1955, 56 y.o.   MRN: [id number]  HPI  [name] [name] is here for followup after initiating Zoloft that she takes in addition to her Wellbutrin.  She states she is 75% iimproved.  She is smiling .Marland Kitchen  Situations with daughter and grandd better.  Daughter using suboxone for addiction treatment.    Smiling affect brighter today.  No S/H ideation no psychotic features  See labs.  She admits to lots of dietary indiscretion.  She has seen Dr. Talmage Nap who changed her Glimepiride dose .  She eats when she is stressed  Allergies  Allergen Reactions  . Iohexol      Desc: ANAPHYLACTIC REACTION   . Penicillins Rash  . Sulfa Antibiotics Rash   Past Medical History  Diagnosis Date  . Colon polyps 12/05  . Thrombocytopenia     work up with Dr. Arlice Colt neg in 2000  . Carotid stenosis, left 04/2009    50-69%  . Folate deficiency   . Obesity, morbid (more than 100 lbs over ideal weight or BMI > 40)   . Allergic rhinitis   . Asthma   . Depression   . Diabetes mellitus     followed by Dr. Talmage Nap  . Hyperlipidemia   . Hypertension   . Tobacco abuse   . Lumbar disc disease     h/o hnp with repair  . Abdominal abscess    Past Surgical History  Procedure Date  . Back surgery 1989    LS  for HNP  . Cholecystectomy 1982  . Hernia repair 2001    incisional  . Incise and drain abcess 01/11/11    abd abscess   History   Social History  . Marital Status: Divorced    Spouse Name: N/A    Number of Children: 2  . Years of Education: HS grad   Occupational History  . QUALITY MANAGER    Social History Main Topics  . Smoking status: Current Every Day Smoker -- 1.0 packs/day for 37 years    Types: Cigarettes  . Smokeless tobacco: Never Used  . Alcohol Use: No  . Drug Use: No  . Sexually Active: Not Currently -- Female partner(s)    Birth Control/ Protection: IUD   Other Topics Concern  . Not on file   Social History Narrative  .  No narrative on file   Family History  Problem Relation Age of Onset  . Asthma Mother   . Hypertension Mother   . Stroke Father   . Alcohol abuse Father   . Suicidality Sister   . Cancer Sister     breast  . Suicidality Brother   . Dementia Sister   . Cancer Sister     brain stem tumor   Patient Active Problem List  Diagnosis  . Allergic rhinitis  . Asthma  . Depression  . Diabetes mellitus  . Hyperlipidemia  . Hypertension  . Colon polyps  . Folate deficiency  . Thrombocytopenia  . Obesity, morbid (more than 100 lbs over ideal weight or BMI > 40)  . Carotid stenosis, left  . Tobacco abuse  . Lumbar disc disease  . Abdominal wall abscess   Current Outpatient Prescriptions on File Prior to Visit  Medication Sig Dispense Refill  . aspirin 81 MG EC tablet Take 81 mg by mouth daily.        Marland Kitchen atorvastatin (LIPITOR) 20 MG tablet TAKE 1 TABLET DAILY  30 tablet  1  . buPROPion (WELLBUTRIN XL) 300 MG 24 hr tablet TAKE 1 TABLET BY MOUTH EVERY DAY  60 tablet  1  . Cholecalciferol (VITAMIN D3) 1000 UNITS CAPS Take 1 capsule by mouth 2 (two) times daily.        . folic acid (FOLVITE) 1 MG tablet Take 1 mg by mouth daily.        Marland Kitchen glimepiride (AMARYL) 1 MG tablet Take 4 mg by mouth daily before breakfast.       . hydrochlorothiazide (HYDRODIURIL) 25 MG tablet Take 1 tablet (25 mg total) by mouth daily.  90 tablet  1  . metFORMIN (GLUMETZA) 1000 MG (MOD) 24 hr tablet Take 1,000 mg by mouth 2 (two) times daily with a meal.      . ramipril (ALTACE) 10 MG capsule Take 1 capsule (10 mg total) by mouth 2 (two) times daily.  120 capsule  1  . sertraline (ZOLOFT) 50 MG tablet Take one tablet at hs for one week then two tablets daily  60 tablet  3  . albuterol (PROVENTIL,VENTOLIN) 90 MCG/ACT inhaler Inhale 2 puffs into the lungs every 6 (six) hours as needed for wheezing.  17 g  12      Review of Systems See HPI    Objective:   Physical Exam Physical Exam  Nursing note and vitals  reviewed.  Constitutional: She is oriented to person, place, and time. She appears well-developed and well-nourished.  HENT:  Head: Normocephalic and atraumatic.  Cardiovascular: Normal rate and regular rhythm. Exam reveals no gallop and no friction rub.  No murmur heard.  Pulmonary/Chest: Breath sounds normal. She has no wheezes. She has no rales.  Neurological: She is alert and oriented to person, place, and time.  Skin: Skin is warm and dry.  Psychiatric: She has a normal mood and affect. Her behavior is normal.   Smiling affect brighter.            Assessment & Plan:  Depression  Continue Zoloft 100 mg with her wellbutrin. 300 mg  HTN  Continue current meds  Hyperlipidemia  Continue current meds  Diabetes managed by Dr. Talmage Nap  Chronic thrombocytopenia  See me 4 months or prn

## 2012-03-06 NOTE — Patient Instructions (Addendum)
See me in 3-4 months  Take meds as ordered

## 2012-04-15 ENCOUNTER — Other Ambulatory Visit: Payer: Self-pay | Admitting: Internal Medicine

## 2012-04-16 NOTE — Telephone Encounter (Signed)
refilll request

## 2012-06-05 ENCOUNTER — Encounter (HOSPITAL_COMMUNITY): Payer: Self-pay | Admitting: Radiology

## 2012-06-05 ENCOUNTER — Emergency Department (HOSPITAL_COMMUNITY)
Admission: EM | Admit: 2012-06-05 | Discharge: 2012-06-05 | Disposition: A | Discharge status: Home / Self Care | Payer: BC Managed Care – PPO | Attending: Emergency Medicine | Admitting: Emergency Medicine

## 2012-06-05 ENCOUNTER — Emergency Department (HOSPITAL_COMMUNITY): Payer: BC Managed Care – PPO

## 2012-06-05 DIAGNOSIS — S0101XA Laceration without foreign body of scalp, initial encounter: Secondary | ICD-10-CM

## 2012-06-05 DIAGNOSIS — F3289 Other specified depressive episodes: Secondary | ICD-10-CM | POA: Insufficient documentation

## 2012-06-05 DIAGNOSIS — Z79899 Other long term (current) drug therapy: Secondary | ICD-10-CM | POA: Insufficient documentation

## 2012-06-05 DIAGNOSIS — I10 Essential (primary) hypertension: Secondary | ICD-10-CM | POA: Insufficient documentation

## 2012-06-05 DIAGNOSIS — S62308A Unspecified fracture of other metacarpal bone, initial encounter for closed fracture: Secondary | ICD-10-CM

## 2012-06-05 DIAGNOSIS — E538 Deficiency of other specified B group vitamins: Secondary | ICD-10-CM | POA: Insufficient documentation

## 2012-06-05 DIAGNOSIS — Z8719 Personal history of other diseases of the digestive system: Secondary | ICD-10-CM | POA: Insufficient documentation

## 2012-06-05 DIAGNOSIS — E785 Hyperlipidemia, unspecified: Secondary | ICD-10-CM | POA: Insufficient documentation

## 2012-06-05 DIAGNOSIS — S20219A Contusion of unspecified front wall of thorax, initial encounter: Secondary | ICD-10-CM | POA: Insufficient documentation

## 2012-06-05 DIAGNOSIS — S20211A Contusion of right front wall of thorax, initial encounter: Secondary | ICD-10-CM

## 2012-06-05 DIAGNOSIS — F17200 Nicotine dependence, unspecified, uncomplicated: Secondary | ICD-10-CM | POA: Insufficient documentation

## 2012-06-05 DIAGNOSIS — S46909A Unspecified injury of unspecified muscle, fascia and tendon at shoulder and upper arm level, unspecified arm, initial encounter: Secondary | ICD-10-CM | POA: Insufficient documentation

## 2012-06-05 DIAGNOSIS — S62319A Displaced fracture of base of unspecified metacarpal bone, initial encounter for closed fracture: Secondary | ICD-10-CM | POA: Insufficient documentation

## 2012-06-05 DIAGNOSIS — Z862 Personal history of diseases of the blood and blood-forming organs and certain disorders involving the immune mechanism: Secondary | ICD-10-CM | POA: Insufficient documentation

## 2012-06-05 DIAGNOSIS — S8000XA Contusion of unspecified knee, initial encounter: Secondary | ICD-10-CM | POA: Insufficient documentation

## 2012-06-05 DIAGNOSIS — Z872 Personal history of diseases of the skin and subcutaneous tissue: Secondary | ICD-10-CM | POA: Insufficient documentation

## 2012-06-05 DIAGNOSIS — S0003XA Contusion of scalp, initial encounter: Secondary | ICD-10-CM | POA: Insufficient documentation

## 2012-06-05 DIAGNOSIS — E119 Type 2 diabetes mellitus without complications: Secondary | ICD-10-CM | POA: Insufficient documentation

## 2012-06-05 DIAGNOSIS — R062 Wheezing: Secondary | ICD-10-CM

## 2012-06-05 DIAGNOSIS — Z7982 Long term (current) use of aspirin: Secondary | ICD-10-CM | POA: Insufficient documentation

## 2012-06-05 DIAGNOSIS — Z860100 Personal history of colon polyps, unspecified: Secondary | ICD-10-CM | POA: Insufficient documentation

## 2012-06-05 DIAGNOSIS — S0100XA Unspecified open wound of scalp, initial encounter: Secondary | ICD-10-CM | POA: Insufficient documentation

## 2012-06-05 DIAGNOSIS — F329 Major depressive disorder, single episode, unspecified: Secondary | ICD-10-CM | POA: Insufficient documentation

## 2012-06-05 DIAGNOSIS — Y9389 Activity, other specified: Secondary | ICD-10-CM | POA: Insufficient documentation

## 2012-06-05 DIAGNOSIS — S1093XA Contusion of unspecified part of neck, initial encounter: Secondary | ICD-10-CM

## 2012-06-05 DIAGNOSIS — S4980XA Other specified injuries of shoulder and upper arm, unspecified arm, initial encounter: Secondary | ICD-10-CM | POA: Insufficient documentation

## 2012-06-05 DIAGNOSIS — S3981XA Other specified injuries of abdomen, initial encounter: Secondary | ICD-10-CM | POA: Insufficient documentation

## 2012-06-05 DIAGNOSIS — Z8739 Personal history of other diseases of the musculoskeletal system and connective tissue: Secondary | ICD-10-CM | POA: Insufficient documentation

## 2012-06-05 DIAGNOSIS — Y92410 Unspecified street and highway as the place of occurrence of the external cause: Secondary | ICD-10-CM | POA: Insufficient documentation

## 2012-06-05 DIAGNOSIS — J45909 Unspecified asthma, uncomplicated: Secondary | ICD-10-CM | POA: Insufficient documentation

## 2012-06-05 LAB — POCT I-STAT, CHEM 8
Chloride: 99 mEq/L (ref 96–112)
HCT: 45 % (ref 36.0–46.0)
Potassium: 4.2 mEq/L (ref 3.5–5.1)

## 2012-06-05 MED ORDER — ALBUTEROL SULFATE (5 MG/ML) 0.5% IN NEBU: 5.0000 mg | INHALATION_SOLUTION | Freq: Once | RESPIRATORY_TRACT | Status: DC

## 2012-06-05 MED ORDER — ONDANSETRON HCL 4 MG/2ML IJ SOLN
4.0000 mg | Freq: Once | INTRAMUSCULAR | Status: AC
  Administered 2012-06-05: 4 mg via INTRAVENOUS
  Filled 2012-06-05: qty 2

## 2012-06-05 MED ORDER — DIPHENHYDRAMINE HCL 50 MG/ML IJ SOLN
50.0000 mg | Freq: Once | INTRAMUSCULAR | Status: AC
  Administered 2012-06-05: 50 mg via INTRAVENOUS
  Filled 2012-06-05: qty 1

## 2012-06-05 MED ORDER — MELOXICAM 15 MG PO TABS: 15.0000 mg | ORAL_TABLET | Freq: Every day | ORAL | Status: DC

## 2012-06-05 MED ORDER — HYDROMORPHONE HCL PF 1 MG/ML IJ SOLN
1.0000 mg | Freq: Once | INTRAMUSCULAR | Status: AC
  Administered 2012-06-05: 1 mg via INTRAVENOUS
  Filled 2012-06-05: qty 1

## 2012-06-05 MED ORDER — MORPHINE SULFATE 4 MG/ML IJ SOLN
4.0000 mg | Freq: Once | INTRAMUSCULAR | Status: AC
  Administered 2012-06-05: 4 mg via INTRAVENOUS
  Filled 2012-06-05: qty 1

## 2012-06-05 MED ORDER — IOHEXOL 300 MG/ML  SOLN
100.0000 mL | Freq: Once | INTRAMUSCULAR | Status: AC | PRN
  Administered 2012-06-05: 100 mL via INTRAVENOUS

## 2012-06-05 MED ORDER — HYDROCORTISONE SOD SUCCINATE 100 MG IJ SOLR
200.0000 mg | Freq: Once | INTRAMUSCULAR | Status: AC
  Administered 2012-06-05: 200 mg via INTRAVENOUS
  Filled 2012-06-05: qty 4
  Filled 2012-06-05: qty 2

## 2012-06-05 MED ORDER — SODIUM CHLORIDE 0.9 % IV BOLUS (SEPSIS)
1000.0000 mL | Freq: Once | INTRAVENOUS | Status: AC
  Administered 2012-06-05: 1000 mL via INTRAVENOUS

## 2012-06-05 MED ORDER — OXYCODONE-ACETAMINOPHEN 5-325 MG PO TABS: 1.0000 | ORAL_TABLET | ORAL | Status: DC | PRN
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
## 2012-06-05 NOTE — Progress Notes (Signed)
Orthopedic Tech Progress Note Patient Details:  [name] [name] [birth year] [id number]  Ortho Devices Type of Ortho Device: Ace wrap;Ulna gutter splint Ortho Device/Splint Interventions: Application   Cammer, Mickie Bail 06/05/2012, 1:21 PM

## 2012-06-05 NOTE — Progress Notes (Signed)
Pt received 1 hr premeds. Per [facility]RA protocol for IV contrast allergy:  100 mg IV Solu-cortef, 50 mg IV Benadryl 1 hr. Prior to exam. Pt tolerated procedure well,and without complications

## 2012-06-09 NOTE — ED Provider Notes (Signed)
Medical screening examination/treatment/procedure(s) were conducted as a shared visit with non-physician practitioner(s) and myself.  I personally evaluated the patient during the encounter   Joya Gaskins, MD 06/09/12 [phone]

## 2012-06-12 ENCOUNTER — Telehealth: Payer: Self-pay | Admitting: *Deleted

## 2012-06-12 NOTE — Telephone Encounter (Signed)
Received pt disability forms via fax placed on Dr Lonell Face desk for review

## 2012-06-13 ENCOUNTER — Encounter: Payer: Self-pay | Admitting: Internal Medicine

## 2012-06-13 ENCOUNTER — Ambulatory Visit (INDEPENDENT_AMBULATORY_CARE_PROVIDER_SITE_OTHER): Payer: BC Managed Care – PPO | Admitting: Internal Medicine

## 2012-06-13 DIAGNOSIS — S20211D Contusion of right front wall of thorax, subsequent encounter: Secondary | ICD-10-CM

## 2012-06-13 DIAGNOSIS — S62609A Fracture of unspecified phalanx of unspecified finger, initial encounter for closed fracture: Secondary | ICD-10-CM

## 2012-06-13 DIAGNOSIS — S20219A Contusion of unspecified front wall of thorax, initial encounter: Secondary | ICD-10-CM | POA: Insufficient documentation

## 2012-06-13 MED ORDER — OXYCODONE-ACETAMINOPHEN 5-325 MG PO TABS: 1.0000 | ORAL_TABLET | ORAL | Status: DC | PRN
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
## 2012-06-14 LAB — COMPREHENSIVE METABOLIC PANEL
AST: 23 U/L (ref 0–37)
Alkaline Phosphatase: 80 U/L (ref 39–117)
BUN: 8 mg/dL (ref 6–23)
CO2: 26 mEq/L (ref 19–32)
Chloride: 97 mEq/L (ref 96–112)
Creat: 0.6 mg/dL (ref 0.50–1.10)
Glucose, Bld: 94 mg/dL (ref 70–99)
Potassium: 4 mEq/L (ref 3.5–5.3)
Sodium: 133 mEq/L — ABNORMAL LOW (ref 135–145)
Total Bilirubin: 0.5 mg/dL (ref 0.3–1.2)

## 2012-06-15 LAB — CBC WITH DIFFERENTIAL/PLATELET
HCT: 41.8 % (ref 36.0–46.0)
Hemoglobin: 13.8 g/dL (ref 12.0–15.0)
MCH: 25.8 pg — ABNORMAL LOW (ref 26.0–34.0)
MCHC: 33 g/dL (ref 30.0–36.0)
RBC: 5.34 MIL/uL — ABNORMAL HIGH (ref 3.87–5.11)

## 2012-06-19 ENCOUNTER — Encounter: Payer: Self-pay | Admitting: *Deleted

## 2012-06-20 ENCOUNTER — Encounter: Payer: Self-pay | Admitting: Internal Medicine

## 2012-06-20 ENCOUNTER — Ambulatory Visit (INDEPENDENT_AMBULATORY_CARE_PROVIDER_SITE_OTHER): Payer: BC Managed Care – PPO | Admitting: Internal Medicine

## 2012-06-20 VITALS — BP 138/84 | HR 84 | Temp 97.3°F | Resp 18 | Wt 269.0 lb

## 2012-06-20 DIAGNOSIS — Z5189 Encounter for other specified aftercare: Secondary | ICD-10-CM

## 2012-06-20 DIAGNOSIS — I10 Essential (primary) hypertension: Secondary | ICD-10-CM

## 2012-06-20 DIAGNOSIS — S20211D Contusion of right front wall of thorax, subsequent encounter: Secondary | ICD-10-CM

## 2012-06-20 DIAGNOSIS — IMO0001 Reserved for inherently not codable concepts without codable children: Secondary | ICD-10-CM

## 2012-06-20 DIAGNOSIS — S62609A Fracture of unspecified phalanx of unspecified finger, initial encounter for closed fracture: Secondary | ICD-10-CM

## 2012-06-20 NOTE — Patient Instructions (Addendum)
See me in 4 weeks 
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
## 2012-07-02 ENCOUNTER — Other Ambulatory Visit: Payer: Self-pay | Admitting: Internal Medicine

## 2012-07-03 NOTE — Telephone Encounter (Signed)
Refill request

## 2012-07-05 ENCOUNTER — Ambulatory Visit: Payer: BC Managed Care – PPO | Admitting: Internal Medicine

## 2012-07-23 ENCOUNTER — Encounter: Payer: Self-pay | Admitting: Internal Medicine

## 2012-07-23 ENCOUNTER — Ambulatory Visit (INDEPENDENT_AMBULATORY_CARE_PROVIDER_SITE_OTHER): Payer: BC Managed Care – PPO | Admitting: Internal Medicine

## 2012-07-23 VITALS — BP 136/78 | HR 83 | Temp 97.4°F | Resp 18 | Wt 272.0 lb

## 2012-07-23 DIAGNOSIS — F17200 Nicotine dependence, unspecified, uncomplicated: Secondary | ICD-10-CM

## 2012-07-23 DIAGNOSIS — Z720 Tobacco use: Secondary | ICD-10-CM

## 2012-07-23 DIAGNOSIS — J309 Allergic rhinitis, unspecified: Secondary | ICD-10-CM

## 2012-07-23 DIAGNOSIS — I10 Essential (primary) hypertension: Secondary | ICD-10-CM

## 2012-07-23 DIAGNOSIS — S20219D Contusion of unspecified front wall of thorax, subsequent encounter: Secondary | ICD-10-CM

## 2012-07-23 DIAGNOSIS — Z5189 Encounter for other specified aftercare: Secondary | ICD-10-CM

## 2012-07-23 MED ORDER — FLUTICASONE PROPIONATE 50 MCG/ACT NA SUSP: 2.0000 | Freq: Every day | NASAL | Status: DC

## 2012-07-23 MED ORDER — FLUTICASONE PROPIONATE 50 MCG/ACT NA SUSP: NASAL | Status: DC

## 2012-07-23 NOTE — Patient Instructions (Addendum)
See me in 3-4 months 
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
## 2012-08-15 ENCOUNTER — Other Ambulatory Visit: Payer: Self-pay | Admitting: Internal Medicine

## 2012-08-16 NOTE — Telephone Encounter (Signed)
Refill request

## 2012-09-06 ENCOUNTER — Other Ambulatory Visit: Payer: Self-pay | Admitting: Internal Medicine

## 2012-10-22 ENCOUNTER — Ambulatory Visit: Payer: BC Managed Care – PPO | Admitting: Internal Medicine

## 2012-10-22 DIAGNOSIS — Z09 Encounter for follow-up examination after completed treatment for conditions other than malignant neoplasm: Secondary | ICD-10-CM

## 2012-11-07 ENCOUNTER — Other Ambulatory Visit: Payer: Self-pay | Admitting: Internal Medicine

## 2012-11-08 NOTE — Telephone Encounter (Signed)
Refill request

## 2012-12-13 ENCOUNTER — Other Ambulatory Visit: Payer: Self-pay | Admitting: Internal Medicine

## 2012-12-13 NOTE — Telephone Encounter (Signed)
Refill request

## 2012-12-14 NOTE — Telephone Encounter (Signed)
[employer] and tell her that I need to see her in office to check her sodium level  It was low last check (likely due to HCTZ)  And I want to recheck this before giving her 3 months of diuretic.  I also want to check her K level  OK to have 30 of HCTZ sent to pharmacy of choice  Give her a 30 min appt to see me in next 1-2 weeks

## 2012-12-17 ENCOUNTER — Ambulatory Visit
Admission: RE | Admit: 2012-12-17 | Discharge: 2012-12-17 | Disposition: A | Discharge status: Home / Self Care | Payer: BC Managed Care – PPO | Source: Ambulatory Visit | Attending: *Deleted | Admitting: *Deleted

## 2012-12-17 ENCOUNTER — Other Ambulatory Visit: Payer: Self-pay | Admitting: *Deleted

## 2012-12-17 DIAGNOSIS — R52 Pain, unspecified: Secondary | ICD-10-CM

## 2013-01-04 ENCOUNTER — Other Ambulatory Visit: Payer: Self-pay | Admitting: Internal Medicine

## 2013-01-07 NOTE — Telephone Encounter (Signed)
Refill request

## 2013-01-08 ENCOUNTER — Other Ambulatory Visit: Payer: Self-pay | Admitting: Internal Medicine

## 2013-01-08 ENCOUNTER — Other Ambulatory Visit (INDEPENDENT_AMBULATORY_CARE_PROVIDER_SITE_OTHER): Payer: BC Managed Care – PPO

## 2013-01-08 ENCOUNTER — Other Ambulatory Visit: Payer: Self-pay | Admitting: *Deleted

## 2013-01-08 DIAGNOSIS — N390 Urinary tract infection, site not specified: Secondary | ICD-10-CM

## 2013-01-08 MED ORDER — CIPROFLOXACIN HCL 500 MG PO TABS: 500.0000 mg | ORAL_TABLET | Freq: Two times a day (BID) | ORAL | Status: DC

## 2013-01-08 NOTE — Telephone Encounter (Signed)
[name] [name]  I have not seen her since march.    I need to see her every 3-4 months with her diabetes  I only orderd 30 tablets of her HCTZ.  Schedule her a 30 min appt sometime in the next 4 weeks

## 2013-01-08 NOTE — Telephone Encounter (Signed)
Refill request

## 2013-01-09 LAB — POCT URINALYSIS DIPSTICK
Bilirubin, UA: NEGATIVE
Nitrite, UA: POSITIVE
Protein, UA: NEGATIVE
Urobilinogen, UA: NEGATIVE
pH, UA: 7

## 2013-01-14 NOTE — Telephone Encounter (Signed)
Notified pt of need for Follow up appt after UTI tx as well as medication refill

## 2013-01-16 ENCOUNTER — Telehealth: Payer: Self-pay | Admitting: *Deleted

## 2013-01-18 NOTE — Telephone Encounter (Signed)
appt notice

## 2013-01-28 ENCOUNTER — Encounter: Payer: Self-pay | Admitting: Internal Medicine

## 2013-01-28 ENCOUNTER — Ambulatory Visit (INDEPENDENT_AMBULATORY_CARE_PROVIDER_SITE_OTHER): Payer: BC Managed Care – PPO | Admitting: Internal Medicine

## 2013-01-28 VITALS — BP 156/81 | HR 101 | Temp 98.1°F | Resp 22 | Wt 272.0 lb

## 2013-01-28 DIAGNOSIS — D696 Thrombocytopenia, unspecified: Secondary | ICD-10-CM

## 2013-01-28 DIAGNOSIS — E785 Hyperlipidemia, unspecified: Secondary | ICD-10-CM

## 2013-01-28 DIAGNOSIS — J438 Other emphysema: Secondary | ICD-10-CM

## 2013-01-28 DIAGNOSIS — J439 Emphysema, unspecified: Secondary | ICD-10-CM | POA: Insufficient documentation

## 2013-01-28 DIAGNOSIS — IMO0001 Reserved for inherently not codable concepts without codable children: Secondary | ICD-10-CM

## 2013-01-28 DIAGNOSIS — I10 Essential (primary) hypertension: Secondary | ICD-10-CM

## 2013-01-28 LAB — POCT URINALYSIS DIPSTICK
Bilirubin, UA: NEGATIVE
Blood, UA: NEGATIVE
Ketones, UA: NEGATIVE
Protein, UA: NEGATIVE
pH, UA: 6.5

## 2013-01-28 LAB — HEMOGLOBIN A1C: Hgb A1c MFr Bld: 8 % — ABNORMAL HIGH (ref <5.7)

## 2013-01-28 MED ORDER — ALBUTEROL SULFATE (5 MG/ML) 0.5% IN NEBU
5.0000 mg | INHALATION_SOLUTION | Freq: Once | RESPIRATORY_TRACT | Status: AC
  Administered 2013-01-28: 5 mg via RESPIRATORY_TRACT

## 2013-01-28 MED ORDER — METHYLPREDNISOLONE ACETATE 80 MG/ML IJ SUSP
120.0000 mg | Freq: Once | INTRAMUSCULAR | Status: AC
  Administered 2013-01-28: 120 mg via INTRAMUSCULAR

## 2013-01-28 MED ORDER — BUDESONIDE-FORMOTEROL FUMARATE 160-4.5 MCG/ACT IN AERO: 2.0000 | INHALATION_SPRAY | Freq: Two times a day (BID) | RESPIRATORY_TRACT | Status: DC

## 2013-01-28 NOTE — Patient Instructions (Addendum)
New breathing medication :  Symbicort inhale 2 puffs bid until I see you again  Get labs done today    See me in 3 weeks  30 min appt  Schedule CPE with me in 2015
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
## 2013-01-29 LAB — LIPID PANEL
Cholesterol: 155 mg/dL (ref 0–200)
HDL: 48 mg/dL (ref >39)
Total CHOL/HDL Ratio: 3.2 Ratio
VLDL: 16 mg/dL (ref 0–40)

## 2013-01-29 LAB — CBC WITH DIFFERENTIAL/PLATELET
Basophils Absolute: 0 10*3/uL (ref 0.0–0.1)
Eosinophils Relative: 3 % (ref 0–5)
HCT: 42.6 % (ref 36.0–46.0)
Hemoglobin: 14 g/dL (ref 12.0–15.0)
Lymphocytes Relative: 26 % (ref 12–46)
Lymphs Abs: 2.1 10*3/uL (ref 0.7–4.0)
MCH: 25.6 pg — ABNORMAL LOW (ref 26.0–34.0)
MCV: 78 fL (ref 78.0–100.0)
Monocytes Relative: 7 % (ref 3–12)
Neutrophils Relative %: 63 % (ref 43–77)
Platelets: 139 10*3/uL — ABNORMAL LOW (ref 150–400)
RBC: 5.46 MIL/uL — ABNORMAL HIGH (ref 3.87–5.11)
WBC: 8 10*3/uL (ref 4.0–10.5)

## 2013-01-29 LAB — COMPREHENSIVE METABOLIC PANEL
Alkaline Phosphatase: 85 U/L (ref 39–117)
BUN: 9 mg/dL (ref 6–23)
CO2: 27 mEq/L (ref 19–32)
Creat: 0.69 mg/dL (ref 0.50–1.10)
Glucose, Bld: 153 mg/dL — ABNORMAL HIGH (ref 70–99)
Potassium: 4.4 mEq/L (ref 3.5–5.3)
Total Bilirubin: 0.3 mg/dL (ref 0.3–1.2)
Total Protein: 6.7 g/dL (ref 6.0–8.3)

## 2013-01-29 LAB — TSH: TSH: 2.477 u[IU]/mL (ref 0.350–4.500)

## 2013-01-29 LAB — VITAMIN D 25 HYDROXY (VIT D DEFICIENCY, FRACTURES): Vit D, 25-Hydroxy: 26 ng/mL — ABNORMAL LOW (ref 30–89)

## 2013-01-29 LAB — MICROALBUMIN / CREATININE URINE RATIO: Microalb Creat Ratio: 9.8 mg/g (ref 0.0–30.0)

## 2013-01-31 NOTE — Telephone Encounter (Signed)
Spoke with pt and informed of low platelets and AIc'  Advised to follow with Dr. Darnelle Catalan her hematolgist for her platelets and she will contact  Dr. Talmage Nap for insulin adjustment

## 2013-02-05 ENCOUNTER — Encounter: Payer: Self-pay | Admitting: *Deleted

## 2013-02-12 ENCOUNTER — Telehealth: Payer: Self-pay | Admitting: *Deleted

## 2013-02-12 NOTE — Telephone Encounter (Signed)
[name] [name] called today.  She thinks the new medication, Symbicort, is causing a bad reaction. She is having cough, sinus congestion, mucous running down back of throat, sore throat, swollen glands, and cough.  I spoke with Dr Constance Goltz, and she advised that patient has caught a bug.  I let patient know that her Symbicort is not causing her symptoms, but she may stop it if she wants.  I also told her that if her symptoms worsen she will need to be seen at an urgent care facility.  She voiced understanding.

## 2013-02-16 ENCOUNTER — Encounter (HOSPITAL_COMMUNITY): Payer: Self-pay | Admitting: Emergency Medicine

## 2013-02-16 ENCOUNTER — Emergency Department (INDEPENDENT_AMBULATORY_CARE_PROVIDER_SITE_OTHER)
Admission: EM | Admit: 2013-02-16 | Discharge: 2013-02-16 | Disposition: A | Discharge status: Home / Self Care | Payer: BC Managed Care – PPO | Source: Home / Self Care | Attending: Family Medicine | Admitting: Family Medicine

## 2013-02-16 DIAGNOSIS — J441 Chronic obstructive pulmonary disease with (acute) exacerbation: Secondary | ICD-10-CM

## 2013-02-16 MED ORDER — ALBUTEROL SULFATE (5 MG/ML) 0.5% IN NEBU
INHALATION_SOLUTION | RESPIRATORY_TRACT | Status: AC
  Filled 2013-02-16: qty 1

## 2013-02-16 MED ORDER — GUAIFENESIN-CODEINE 100-10 MG/5ML PO SOLN: 5.0000 mL | Freq: Every evening | ORAL | Status: DC | PRN

## 2013-02-16 MED ORDER — ALBUTEROL SULFATE (5 MG/ML) 0.5% IN NEBU
5.0000 mg | INHALATION_SOLUTION | Freq: Once | RESPIRATORY_TRACT | Status: AC
  Administered 2013-02-16: 5 mg via RESPIRATORY_TRACT

## 2013-02-16 MED ORDER — IPRATROPIUM BROMIDE 0.02 % IN SOLN
RESPIRATORY_TRACT | Status: AC
  Filled 2013-02-16: qty 2.5

## 2013-02-16 MED ORDER — AZITHROMYCIN 250 MG PO TABS: 250.0000 mg | ORAL_TABLET | Freq: Every day | ORAL | Status: DC

## 2013-02-16 MED ORDER — IPRATROPIUM BROMIDE 0.02 % IN SOLN
0.5000 mg | Freq: Once | RESPIRATORY_TRACT | Status: AC
  Administered 2013-02-16: 0.5 mg via RESPIRATORY_TRACT

## 2013-02-16 MED ORDER — PREDNISONE 50 MG PO TABS: 50.0000 mg | ORAL_TABLET | Freq: Every day | ORAL | Status: DC

## 2013-02-16 NOTE — ED Notes (Signed)
C/o chest congestion with productive cough. Yellow green sputum. Sore throat. Chest tightness with sob. Hx of asthma.  Sinus drainage and chills.  Denies fever, n/v/d. No relief with otc meds

## 2013-02-16 NOTE — ED Provider Notes (Signed)
[name] [name] is a 57 y.o. female who presents to Urgent Care today for productive cough and wheezing and mild shortness of breath present for the last 5 days. Patient is been worsening over the last day or so. She denies any nausea vomiting or diarrhea. She denies any chest pain. This is consistent with prior COPD exacerbations. She does note some sore throat and sinus drainage. She has not tried using her albuterol inhaler. She continues to smoke.   Past Medical History  Diagnosis Date  . Colon polyps 12/05  . Thrombocytopenia     work up with Dr. Arlice Colt neg in 2000  . Carotid stenosis, left 04/2009    50-69%  . Folate deficiency   . Obesity, morbid (more than 100 lbs over ideal weight or BMI > 40)   . Allergic rhinitis   . Asthma   . Depression   . Diabetes mellitus     followed by Dr. Talmage Nap  . Hyperlipidemia   . Hypertension   . Tobacco abuse   . Lumbar disc disease     h/o hnp with repair  . Abdominal abscess    History  Substance Use Topics  . Smoking status: Current Every Day Smoker -- 1.00 packs/day for 37 years    Types: Cigarettes  . Smokeless tobacco: Never Used  . Alcohol Use: No   ROS as above Medications reviewed. No current facility-administered medications for this encounter.   Current Outpatient Prescriptions  Medication Sig Dispense Refill  . albuterol (PROVENTIL HFA;VENTOLIN HFA) 108 (90 BASE) MCG/ACT inhaler Inhale 2 puffs into the lungs every 6 (six) hours as needed for wheezing.      Marland Kitchen aspirin 81 MG EC tablet Take 81 mg by mouth daily.        Marland Kitchen atorvastatin (LIPITOR) 20 MG tablet TAKE 1 TABLET DAILY  90 tablet  0  . budesonide-formoterol (SYMBICORT) 160-4.5 MCG/ACT inhaler Inhale 2 puffs into the lungs 2 (two) times daily.  1 Inhaler  3  . buPROPion (WELLBUTRIN XL) 300 MG 24 hr tablet TAKE 1 TABLET DAILY  90 tablet  3  . hydrochlorothiazide (HYDRODIURIL) 25 MG tablet TAKE 1 TABLET DAILY  30 tablet  0  . insulin lispro (HUMALOG) 100 UNIT/ML  injection Inject 10 Units into the skin 2 (two) times daily.      . ramipril (ALTACE) 10 MG capsule TAKE 1 CAPSULE TWICE A DAY  60 capsule  3  . sertraline (ZOLOFT) 100 MG tablet Take 100 mg by mouth at bedtime.      . sitaGLIPtan-metformin (JANUMET) 50-1000 MG per tablet Take 1 tablet by mouth 2 (two) times daily with a meal.      . azithromycin (ZITHROMAX) 250 MG tablet Take 1 tablet (250 mg total) by mouth daily. Take first 2 tablets together, then 1 every day until finished.  6 tablet  0  . Cholecalciferol (VITAMIN D3) 1000 UNITS CAPS Take 1 capsule by mouth 2 (two) times daily.        . Cinnamon 500 MG TABS Take 500 mg by mouth 2 (two) times daily.      . fluticasone (FLONASE) 50 MCG/ACT nasal spray Place 2 sprays into the nose daily.  16 g  6  . folic acid (FOLVITE) 800 MCG tablet Take 400 mcg by mouth daily.      Marland Kitchen guaiFENesin-codeine 100-10 MG/5ML syrup Take 5 mLs by mouth at bedtime as needed for cough.  120 mL  0  . predniSONE (DELTASONE) 50 MG  tablet Take 1 tablet (50 mg total) by mouth daily.  5 tablet  0  . sertraline (ZOLOFT) 100 MG tablet AT THE START OF THERAPY, TAKE 1 TABLET AT BEDTIME FOR 1 WEEK, THEN 2 TABLETS DAILY THEREAFTER  180 tablet  1    Exam:  BP 149/63  Pulse 105  Temp(Src) 98.3 F (36.8 C) (Oral)  Resp 25  SpO2 94% Gen: Well NAD HEENT: ,  MMM Lungs: Normal work of breathing. Wheezing and prolonged expiratory phase bilaterally Heart: RRR no MRG Abd: NABS, Soft. NT, ND Exts: Non edematous BL  LE, warm and well perfused.   Patient was given a DuoNeb nebulizer treatment and she had considerable improvement in symptoms.  No results found for this or any previous visit (from the past 24 hour(s)). No results found.  Assessment and Plan: 57 y.o. female with COPD exacerbation. Plan to treat with prednisone, albuterol, azithromycin, codeine containing cough medication. Followup with primary care provider. Encourage smoking cessation. Discussed warning signs or  symptoms. Please see discharge instructions. Patient expresses understanding.      Rodolph Bong, MD 02/16/13 [phone]

## 2013-02-18 ENCOUNTER — Emergency Department (HOSPITAL_BASED_OUTPATIENT_CLINIC_OR_DEPARTMENT_OTHER)
Admission: EM | Admit: 2013-02-18 | Discharge: 2013-02-18 | Disposition: A | Discharge status: Home / Self Care | Payer: BC Managed Care – PPO | Attending: Emergency Medicine | Admitting: Emergency Medicine

## 2013-02-18 ENCOUNTER — Ambulatory Visit (INDEPENDENT_AMBULATORY_CARE_PROVIDER_SITE_OTHER): Payer: BC Managed Care – PPO | Admitting: Internal Medicine

## 2013-02-18 ENCOUNTER — Encounter: Payer: Self-pay | Admitting: Internal Medicine

## 2013-02-18 ENCOUNTER — Encounter (HOSPITAL_BASED_OUTPATIENT_CLINIC_OR_DEPARTMENT_OTHER): Payer: Self-pay | Admitting: Emergency Medicine

## 2013-02-18 ENCOUNTER — Ambulatory Visit (HOSPITAL_BASED_OUTPATIENT_CLINIC_OR_DEPARTMENT_OTHER)
Admission: RE | Admit: 2013-02-18 | Discharge: 2013-02-18 | Disposition: A | Discharge status: Home / Self Care | Payer: BC Managed Care – PPO | Source: Ambulatory Visit | Attending: Internal Medicine | Admitting: Internal Medicine

## 2013-02-18 VITALS — BP 142/74 | HR 103 | Temp 97.0°F | Resp 24 | Wt 279.0 lb

## 2013-02-18 DIAGNOSIS — J189 Pneumonia, unspecified organism: Secondary | ICD-10-CM

## 2013-02-18 DIAGNOSIS — Z860100 Personal history of colon polyps, unspecified: Secondary | ICD-10-CM | POA: Insufficient documentation

## 2013-02-18 DIAGNOSIS — Z7982 Long term (current) use of aspirin: Secondary | ICD-10-CM | POA: Insufficient documentation

## 2013-02-18 DIAGNOSIS — F17200 Nicotine dependence, unspecified, uncomplicated: Secondary | ICD-10-CM | POA: Insufficient documentation

## 2013-02-18 DIAGNOSIS — J449 Chronic obstructive pulmonary disease, unspecified: Secondary | ICD-10-CM | POA: Insufficient documentation

## 2013-02-18 DIAGNOSIS — IMO0002 Reserved for concepts with insufficient information to code with codable children: Secondary | ICD-10-CM | POA: Insufficient documentation

## 2013-02-18 DIAGNOSIS — Z79899 Other long term (current) drug therapy: Secondary | ICD-10-CM | POA: Insufficient documentation

## 2013-02-18 DIAGNOSIS — Z862 Personal history of diseases of the blood and blood-forming organs and certain disorders involving the immune mechanism: Secondary | ICD-10-CM | POA: Insufficient documentation

## 2013-02-18 DIAGNOSIS — E119 Type 2 diabetes mellitus without complications: Secondary | ICD-10-CM | POA: Insufficient documentation

## 2013-02-18 DIAGNOSIS — Z872 Personal history of diseases of the skin and subcutaneous tissue: Secondary | ICD-10-CM | POA: Insufficient documentation

## 2013-02-18 DIAGNOSIS — J4489 Other specified chronic obstructive pulmonary disease: Secondary | ICD-10-CM | POA: Insufficient documentation

## 2013-02-18 DIAGNOSIS — J159 Unspecified bacterial pneumonia: Secondary | ICD-10-CM | POA: Insufficient documentation

## 2013-02-18 DIAGNOSIS — J441 Chronic obstructive pulmonary disease with (acute) exacerbation: Secondary | ICD-10-CM

## 2013-02-18 DIAGNOSIS — F329 Major depressive disorder, single episode, unspecified: Secondary | ICD-10-CM | POA: Insufficient documentation

## 2013-02-18 DIAGNOSIS — Z880 Allergy status to penicillin: Secondary | ICD-10-CM | POA: Insufficient documentation

## 2013-02-18 DIAGNOSIS — I10 Essential (primary) hypertension: Secondary | ICD-10-CM | POA: Insufficient documentation

## 2013-02-18 DIAGNOSIS — E785 Hyperlipidemia, unspecified: Secondary | ICD-10-CM | POA: Insufficient documentation

## 2013-02-18 DIAGNOSIS — Z8639 Personal history of other endocrine, nutritional and metabolic disease: Secondary | ICD-10-CM | POA: Insufficient documentation

## 2013-02-18 DIAGNOSIS — Z794 Long term (current) use of insulin: Secondary | ICD-10-CM | POA: Insufficient documentation

## 2013-02-18 DIAGNOSIS — F3289 Other specified depressive episodes: Secondary | ICD-10-CM | POA: Insufficient documentation

## 2013-02-18 DIAGNOSIS — Z8739 Personal history of other diseases of the musculoskeletal system and connective tissue: Secondary | ICD-10-CM | POA: Insufficient documentation

## 2013-02-18 MED ORDER — IPRATROPIUM BROMIDE 0.02 % IN SOLN
0.5000 mg | Freq: Once | RESPIRATORY_TRACT | Status: AC
  Administered 2013-02-18: 0.5 mg via RESPIRATORY_TRACT
  Filled 2013-02-18: qty 2.5

## 2013-02-18 MED ORDER — IPRATROPIUM BROMIDE 0.02 % IN SOLN
0.5000 mg | Freq: Once | RESPIRATORY_TRACT | Status: AC
  Administered 2013-02-18: 0.5 mg via RESPIRATORY_TRACT

## 2013-02-18 MED ORDER — METHYLPREDNISOLONE ACETATE 80 MG/ML IJ SUSP
160.0000 mg | Freq: Once | INTRAMUSCULAR | Status: AC
  Administered 2013-02-18: 160 mg via INTRAMUSCULAR

## 2013-02-18 MED ORDER — ALBUTEROL SULFATE (5 MG/ML) 0.5% IN NEBU
5.0000 mg | INHALATION_SOLUTION | Freq: Once | RESPIRATORY_TRACT | Status: AC
  Administered 2013-02-18: 5 mg via RESPIRATORY_TRACT

## 2013-02-18 MED ORDER — ALBUTEROL SULFATE (5 MG/ML) 0.5% IN NEBU
5.0000 mg | INHALATION_SOLUTION | Freq: Once | RESPIRATORY_TRACT | Status: AC
  Administered 2013-02-18: 5 mg via RESPIRATORY_TRACT
  Filled 2013-02-18: qty 1

## 2013-02-18 MED ORDER — LEVOFLOXACIN 750 MG PO TABS: 750.0000 mg | ORAL_TABLET | Freq: Every day | ORAL | Status: DC

## 2013-02-18 MED ORDER — LEVOFLOXACIN 750 MG PO TABS
750.0000 mg | ORAL_TABLET | Freq: Once | ORAL | Status: AC
  Administered 2013-02-18: 750 mg via ORAL
  Filled 2013-02-18: qty 1

## 2013-02-18 NOTE — ED Notes (Signed)
Pt reports productive cough of green/yellow sputum x 10 days.  Pt sounds hoarse when talking.  Pt denies pain.

## 2013-02-18 NOTE — Addendum Note (Signed)
Addended by: Mathews Robinsons on: 02/18/2013 10:42 AM   Modules accepted: Orders

## 2013-02-18 NOTE — ED Provider Notes (Addendum)
CSN: [id number]     Arrival date & time 02/18/13  1033 History   First MD Initiated Contact with Patient 02/18/13 1034     Chief Complaint  Patient presents with  . Pneumonia   (Consider location/radiation/quality/duration/timing/severity/associated sxs/prior Treatment) Patient is a 57 y.o. female presenting with pneumonia. The history is provided by the patient.  Pneumonia This is a new problem. Episode onset: uri sx started about 10 days ago with cough, congestion, wheezing and sputum production. The problem occurs constantly. The problem has been gradually worsening. Pertinent negatives include no chest pain, no abdominal pain and no headaches. Associated symptoms comments: Seen at urgent care 2 days ago and ddx with bronchitis and started on azithromycin and prednisone however due to no improvement saw her pCP today who did an CXR that showed LML PNA.  This is CAP as no recent hospitalizations.. The symptoms are aggravated by coughing. The symptoms are relieved by rest. Treatments tried: meds and inhaler. The treatment provided no relief.    Past Medical History  Diagnosis Date  . Colon polyps 12/05  . Thrombocytopenia     work up with Dr. Arlice Colt neg in 2000  . Carotid stenosis, left 04/2009    50-69%  . Folate deficiency   . Obesity, morbid (more than 100 lbs over ideal weight or BMI > 40)   . Allergic rhinitis   . Asthma   . Depression   . Diabetes mellitus     followed by Dr. Talmage Nap  . Hyperlipidemia   . Hypertension   . Tobacco abuse   . Lumbar disc disease     h/o hnp with repair  . Abdominal abscess    Past Surgical History  Procedure Laterality Date  . Back surgery  1989    LS  for HNP  . Cholecystectomy  1982  . Hernia repair  2001    incisional  . Incise and drain abcess  01/11/11    abd abscess   Family History  Problem Relation Age of Onset  . Asthma Mother   . Hypertension Mother   . Stroke Father   . Alcohol abuse Father   . Suicidality Sister   .  Cancer Sister     breast  . Suicidality Brother   . Dementia Sister   . Cancer Sister     brain stem tumor   History  Substance Use Topics  . Smoking status: Current Every Day Smoker -- 1.00 packs/day for 37 years    Types: Cigarettes  . Smokeless tobacco: Never Used  . Alcohol Use: No   OB History   Grav Para Term Preterm Abortions TAB SAB Ect Mult Living   2 2             Review of Systems  Constitutional: Negative for fever.  Respiratory: Positive for cough and wheezing.        SOB only with prolonged walking  Cardiovascular: Negative for chest pain.  Gastrointestinal: Negative for nausea, vomiting, abdominal pain and diarrhea.  Genitourinary: Negative for dysuria.  Neurological: Negative for headaches.  All other systems reviewed and are negative.    Allergies  Iohexol; Penicillins; and Sulfa antibiotics  Home Medications   Current Outpatient Rx  Name  Route  Sig  Dispense  Refill  . albuterol (PROVENTIL HFA;VENTOLIN HFA) 108 (90 BASE) MCG/ACT inhaler   Inhalation   Inhale 2 puffs into the lungs every 6 (six) hours as needed for wheezing.         Marland Kitchen  aspirin 81 MG EC tablet   Oral   Take 81 mg by mouth daily.           Marland Kitchen atorvastatin (LIPITOR) 20 MG tablet      TAKE 1 TABLET DAILY   90 tablet   0   . azithromycin (ZITHROMAX) 250 MG tablet   Oral   Take 1 tablet (250 mg total) by mouth daily. Take first 2 tablets together, then 1 every day until finished.   6 tablet   0   . budesonide-formoterol (SYMBICORT) 160-4.5 MCG/ACT inhaler   Inhalation   Inhale 2 puffs into the lungs 2 (two) times daily.   1 Inhaler   3   . buPROPion (WELLBUTRIN XL) 300 MG 24 hr tablet      TAKE 1 TABLET DAILY   90 tablet   3   . Cholecalciferol (VITAMIN D3) 1000 UNITS CAPS   Oral   Take 1 capsule by mouth 2 (two) times daily.           . Cinnamon 500 MG TABS   Oral   Take 500 mg by mouth 2 (two) times daily.         . fluticasone (FLONASE) 50 MCG/ACT  nasal spray   Nasal   Place 2 sprays into the nose daily.   16 g   6   . folic acid (FOLVITE) 800 MCG tablet   Oral   Take 400 mcg by mouth daily.         Marland Kitchen guaiFENesin-codeine 100-10 MG/5ML syrup   Oral   Take 5 mLs by mouth at bedtime as needed for cough.   120 mL   0   . hydrochlorothiazide (HYDRODIURIL) 25 MG tablet      TAKE 1 TABLET DAILY   30 tablet   0   . insulin lispro (HUMALOG) 100 UNIT/ML injection   Subcutaneous   Inject 10 Units into the skin 2 (two) times daily.         . predniSONE (DELTASONE) 50 MG tablet   Oral   Take 1 tablet (50 mg total) by mouth daily.   5 tablet   0   . ramipril (ALTACE) 10 MG capsule      TAKE 1 CAPSULE TWICE A DAY   60 capsule   3   . sertraline (ZOLOFT) 100 MG tablet   Oral   Take 100 mg by mouth at bedtime.         . sertraline (ZOLOFT) 100 MG tablet      AT THE START OF THERAPY, TAKE 1 TABLET AT BEDTIME FOR 1 WEEK, THEN 2 TABLETS DAILY THEREAFTER   180 tablet   1   . sitaGLIPtan-metformin (JANUMET) 50-1000 MG per tablet   Oral   Take 1 tablet by mouth 2 (two) times daily with a meal.          BP 139/86  Pulse 99  Temp(Src) 98.5 F (36.9 C) (Oral)  Resp 20  Ht 5\' 8"  (1.727 m)  Wt 270 lb (122.471 kg)  BMI 41.06 kg/m2  SpO2 94% Physical Exam  Nursing note and vitals reviewed. Constitutional: She is oriented to person, place, and time. She appears well-developed and well-nourished. No distress.  HENT:  Head: Normocephalic and atraumatic.  Mouth/Throat: Oropharynx is clear and moist.  Eyes: Conjunctivae and EOM are normal. Pupils are equal, round, and reactive to light.  Neck: Normal range of motion. Neck supple.  Cardiovascular: Normal rate, regular rhythm and intact distal  pulses.   No murmur heard. Pulmonary/Chest: Effort normal. No respiratory distress. She has wheezes in the left upper field, the left middle field and the left lower field. She has no rales.  Abdominal: Soft. She exhibits no  distension. There is no tenderness. There is no rebound and no guarding.  Musculoskeletal: Normal range of motion. She exhibits no edema and no tenderness.  Neurological: She is alert and oriented to person, place, and time.  Skin: Skin is warm and dry. No rash noted. No erythema.  Psychiatric: She has a normal mood and affect. Her behavior is normal.    ED Course  Procedures (including critical care time) Labs Review Labs Reviewed - No data to display Imaging Review Dg Chest 2 View  02/18/2013   CLINICAL DATA:  Cough and congestion  EXAM: CHEST  2 VIEW  COMPARISON:  June 05, 2012  FINDINGS: The heart size and mediastinal contours are within normal limits. There is consolidation of the left mid to lung base. Hazy opacity in the medial right upper lobe probably due to superimposed shadows from right 1st rib mandibular junction. The visualized skeletal structures are stable. Degenerative joint changes of thoracic spine are noted.  IMPRESSION: Left mid to lung base pneumonia. Followup after treatment is recommended to ensure resolution.   Electronically Signed   By: Sherian Rein M.D.   On: 02/18/2013 10:14    EKG Interpretation   None       MDM   1. CAP (community acquired pneumonia)     Patient sent down by her PCP for further evaluation. She has a history of COPD on Symbicort who was seen by urgent care 2 days ago and given in prednisone and an albuterol inhaler. Since that time she's continued to have a worsening cough saw her PCP today and had a plain film that showed a left middle lobe pneumonia. Patient has had 2 doses of azithromycin but feels like she is not improving. She was given IM steroids in the doctor's office and an albuterol and Atrovent neb which she states improved her breathing significantly. She was able to ambulate down to the emergency room without difficulty satting 97% with a respiratory rate of 20. She is able to talk in complete sentences and has wheezing on the  left side. Will give the patient another albuterol and Atrovent neb. We'll switch her to Levaquin for more broadened coverage for pneumonia. However given how well she looks and normal oxygen saturations feel that she will most likely be able to go home and return if symptoms worsen  12:03 PM Wheezing improved.  Pt wanting to go home.  O2 sats 97% on RA.    Gwyneth Sprout, MD 02/18/13 1203  Gwyneth Sprout, MD 02/18/13 [phone]
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
## 2013-02-21 ENCOUNTER — Ambulatory Visit (INDEPENDENT_AMBULATORY_CARE_PROVIDER_SITE_OTHER): Payer: BC Managed Care – PPO | Admitting: Internal Medicine

## 2013-02-21 ENCOUNTER — Encounter: Payer: Self-pay | Admitting: Internal Medicine

## 2013-02-21 VITALS — BP 123/71 | HR 85 | Temp 97.8°F | Resp 22 | Wt 270.0 lb

## 2013-02-21 DIAGNOSIS — J439 Emphysema, unspecified: Secondary | ICD-10-CM

## 2013-02-21 DIAGNOSIS — J438 Other emphysema: Secondary | ICD-10-CM

## 2013-02-21 DIAGNOSIS — J189 Pneumonia, unspecified organism: Secondary | ICD-10-CM

## 2013-02-21 MED ORDER — ALBUTEROL SULFATE (5 MG/ML) 0.5% IN NEBU
5.0000 mg | INHALATION_SOLUTION | Freq: Once | RESPIRATORY_TRACT | Status: AC
  Administered 2013-02-21: 5 mg via RESPIRATORY_TRACT

## 2013-02-21 MED ORDER — METHYLPREDNISOLONE ACETATE 80 MG/ML IJ SUSP
120.0000 mg | Freq: Once | INTRAMUSCULAR | Status: AC
Start: 2013-02-21 — End: 2013-02-21
  Administered 2013-02-21: 120 mg via INTRAMUSCULAR

## 2013-02-21 MED ORDER — PREDNISONE 20 MG PO TABS: ORAL_TABLET | ORAL | Status: DC

## 2013-02-21 MED ORDER — IPRATROPIUM BROMIDE 0.02 % IN SOLN
0.5000 mg | Freq: Once | RESPIRATORY_TRACT | Status: AC
  Administered 2013-02-21: 0.5 mg via RESPIRATORY_TRACT

## 2013-02-21 NOTE — Patient Instructions (Signed)
See me in 4-5 days
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
## 2013-02-26 ENCOUNTER — Ambulatory Visit (INDEPENDENT_AMBULATORY_CARE_PROVIDER_SITE_OTHER): Payer: BC Managed Care – PPO | Admitting: Internal Medicine

## 2013-02-26 VITALS — BP 146/71 | HR 89 | Temp 97.2°F | Resp 20

## 2013-02-26 DIAGNOSIS — J189 Pneumonia, unspecified organism: Secondary | ICD-10-CM

## 2013-02-26 DIAGNOSIS — IMO0001 Reserved for inherently not codable concepts without codable children: Secondary | ICD-10-CM

## 2013-02-26 DIAGNOSIS — F17200 Nicotine dependence, unspecified, uncomplicated: Secondary | ICD-10-CM

## 2013-02-26 DIAGNOSIS — Z720 Tobacco use: Secondary | ICD-10-CM

## 2013-02-26 DIAGNOSIS — J438 Other emphysema: Secondary | ICD-10-CM

## 2013-02-26 DIAGNOSIS — J439 Emphysema, unspecified: Secondary | ICD-10-CM
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
## 2013-02-26 NOTE — Patient Instructions (Signed)
See me first week of march    Have Chest Xray done before your visit

## 2013-03-13 ENCOUNTER — Other Ambulatory Visit: Payer: Self-pay | Admitting: Internal Medicine

## 2013-03-18 NOTE — Telephone Encounter (Signed)
Refill request

## 2013-03-23 ENCOUNTER — Other Ambulatory Visit: Payer: Self-pay | Admitting: Internal Medicine

## 2013-03-25 ENCOUNTER — Other Ambulatory Visit: Payer: Self-pay | Admitting: Internal Medicine

## 2013-03-25 MED ORDER — POTASSIUM CHLORIDE CRYS ER 20 MEQ PO TBCR: 20.0000 meq | EXTENDED_RELEASE_TABLET | Freq: Every day | ORAL | Status: DC

## 2013-03-25 NOTE — Telephone Encounter (Signed)
Left VM message for pt regarding her HCTZ and K+ awaiting return call

## 2013-03-25 NOTE — Telephone Encounter (Signed)
Refill request

## 2013-03-25 NOTE — Telephone Encounter (Signed)
[name] [name]  I do not see where she is on K supplement with her HCTZ.  I ordered K-dur to be taken every time she take her HCTZ  Call pt  Does she take HCTZ every day??  Advise that when she take the HCTZ she needs to take a K pill with it

## 2013-03-27 ENCOUNTER — Other Ambulatory Visit: Payer: Self-pay | Admitting: *Deleted

## 2013-03-27 MED ORDER — RAMIPRIL 10 MG PO CAPS: 10.0000 mg | ORAL_CAPSULE | Freq: Two times a day (BID) | ORAL | Status: DC

## 2013-03-27 NOTE — Telephone Encounter (Signed)
Refill request duplicate

## 2013-04-29 ENCOUNTER — Other Ambulatory Visit: Payer: Self-pay | Admitting: Internal Medicine

## 2013-04-30 NOTE — Telephone Encounter (Signed)
Refill request

## 2013-05-20 ENCOUNTER — Ambulatory Visit: Payer: BC Managed Care – PPO | Admitting: Internal Medicine

## 2013-05-29 ENCOUNTER — Other Ambulatory Visit: Payer: Self-pay | Admitting: Internal Medicine

## 2013-05-29 NOTE — Telephone Encounter (Signed)
Refill request

## 2013-06-07 ENCOUNTER — Other Ambulatory Visit: Payer: Self-pay | Admitting: Internal Medicine

## 2013-06-10 NOTE — Telephone Encounter (Signed)
Refill request

## 2013-08-01 ENCOUNTER — Other Ambulatory Visit: Payer: Self-pay | Admitting: Internal Medicine

## 2013-08-01 NOTE — Telephone Encounter (Signed)
Refill request

## 2013-08-21 ENCOUNTER — Other Ambulatory Visit: Payer: Self-pay | Admitting: Internal Medicine

## 2013-08-22 NOTE — Telephone Encounter (Signed)
Refill request

## 2013-10-20 ENCOUNTER — Other Ambulatory Visit: Payer: Self-pay | Admitting: Internal Medicine

## 2013-10-21 NOTE — Telephone Encounter (Signed)
[name], [name] - 10/20/2013 2:34 PM ','<More Detail >>       Rx Auth Request    Surescripts Out Interface        Sent: Sun October 20, 2013 2:34 PM    To: [facility] Clinical Pool                   Message     ----- Message from [place] sent at 10/20/2013 2:34 PM -----         The demographic information from the pharmacy is:    Patient Name: AlisonAlison    Patient DOB: May 12, 1955    Patient Gender: Female    Address:    [place] MRN: [id number] Home: [phone]  58 y.o. / Female ([birth year]) PCP: Lanice Shirts, MD Work: [phone]  Pharmacy: [facility], [place] Ph: [phone] Wt: 270 lb (122.471 kg) (02/21/2013) Mobile: [phone]          Guarantor Account: Estel, Tonelli ([id number])     Relation to Patient: Account Type Service Area    Self Personal/Family [place] for This Account     Coverage ID Payor Plan Insurance ID     [id number] [employer] [id number]             Guarantor Account: Abigaile, Rossie ([id number])     Relation to Patient: Account Type Service Area    Self Personal/Family [place] for This Account     Coverage ID Payor Plan Insurance ID     [id number] [facility] [id number]             Guarantor Account: Cai, Flott ([id number])     Relation to Patient: Account Type Service Area    Self Personal/Family [place]                Guarantor Account: Whittney, Steenson ([id number])     Relation to Patient: Account Type Service Area    Self Personal/Family [facility]                   Requested Medications     Medication name:   Name from pharmacy:  hydrochlorothiazide (HYDRODIURIL) 25 MG tablet  HYDROCHLOROTHIAZIDE TAB 25MG     Sig: TAKE 1 TABLET DAILY    Dispense: 30 tablet Refills: 0 Start: 10/20/2013  Class: Normal    Requested on: 10/20/2013    Originally ordered on: 01/11/2012 Last refill: 09/23/2013 Order History and Details              Call Documentation     No notes of this type exist for this encounter.             Contacts       Type Contact Phone    10/20/2013 2:34 PM Interface (Incoming) [place] [phone]              Allergies as of 10/20/2013  Review Complete On: 02/21/2013 By: Marcial Pacas, RN     Allergen Noted Type Reactions    Iohexol 08/06/2007       Desc: ANAPHYLACTIC REACTION     Penicillins 07/28/2010   Rash    Sulfa Antibiotics 07/28/2010   Rash              Patient Flags     No FYI flags for this patient.                 Outstanding Procedures      Open Future Orders       Priority Expected Expires Ordered    DG Chest 2 View STAT  04/21/2014 02/18/2013    DG Foot Complete Left STAT  02/16/2014 12/17/2012           Normal Orders       Priority Ordered    ED EKG STAT 06/05/2012    I-Stat, Chem 8 STAT 06/05/2012              Past Appointments     Date Time Status Provider Dept Type Appt Notes    05/20/2013 8:15 AM Can Lanice Shirts, MD [facility] ESTPT30 follow up/ hb    02/26/2013 10:00 AM Comp Lanice Shirts, MD [facility] ESTPT30 follow up/ hb    02/21/2013 2:15 PM Comp Lanice Shirts, MD [facility] ESTPT30 ER follow up/ hb    02/18/2013 9:35 AM Comp Mhp-Dg 1 MHP-DG DGXRAY     02/18/2013 8:30 AM Comp Lanice Shirts, MD [facility] ESTPT30 follow up/ hb    01/28/2013 9:45 AM Comp Lanice Shirts, MD [facility] ESTPT30 medication renewal / hb    01/08/2013 4:45 PM Comp [facility] [place] LAB     12/17/2012 10:20 AM Comp Gi-315 Dg 1 GI-315DG DGXRAY     10/22/2012 8:15 AM No Show Lanice Shirts, MD [facility] ESTPT30 3 month follow up/ hb    07/23/2012 8:15 AM Comp Lanice Shirts, MD [place] ESTPT30 five week f/u...ad               Pharmacy     [place], [place]    [address] [place] [address]    Phone: [phone] Fax: [phone]    Open 24 Hours?: No

## 2013-10-21 NOTE — Telephone Encounter (Signed)
Requested Medications     Medication name:  Name from pharmacy:  hydrochlorothiazide (HYDRODIURIL) 25 MG tablet  HYDROCHLOROTHIAZIDE TAB 25MG     Sig: TAKE 1 TABLET DAILY    Dispense: 30 tablet Refills: 0 Start: 10/20/2013  Class: Normal    Requested on: 10/20/2013    Originally ordered on: 01/11/2012 Last refill: 09/23/2013 Order History and Details

## 2013-12-31 ENCOUNTER — Other Ambulatory Visit: Payer: Self-pay | Admitting: Internal Medicine

## 2013-12-31 NOTE — Telephone Encounter (Signed)
Refill request

## 2014-01-20 ENCOUNTER — Encounter: Payer: Self-pay | Admitting: Internal Medicine

## 2014-05-08 ENCOUNTER — Other Ambulatory Visit: Payer: Self-pay | Admitting: Endocrinology

## 2014-05-08 DIAGNOSIS — E049 Nontoxic goiter, unspecified: Secondary | ICD-10-CM

## 2014-05-09 ENCOUNTER — Ambulatory Visit
Admission: RE | Admit: 2014-05-09 | Discharge: 2014-05-09 | Disposition: A | Discharge status: Home / Self Care | Payer: BLUE CROSS/BLUE SHIELD | Source: Ambulatory Visit | Attending: Endocrinology | Admitting: Endocrinology

## 2014-05-09 DIAGNOSIS — E049 Nontoxic goiter, unspecified: Secondary | ICD-10-CM

## 2014-09-15 ENCOUNTER — Other Ambulatory Visit: Payer: Self-pay

## 2016-07-25 DIAGNOSIS — E049 Nontoxic goiter, unspecified: Secondary | ICD-10-CM | POA: Diagnosis not present

## 2016-07-25 DIAGNOSIS — E1165 Type 2 diabetes mellitus with hyperglycemia: Secondary | ICD-10-CM | POA: Diagnosis not present

## 2016-07-25 DIAGNOSIS — E7800 Pure hypercholesterolemia, unspecified: Secondary | ICD-10-CM | POA: Diagnosis not present

## 2016-07-28 DIAGNOSIS — H00024 Hordeolum internum left upper eyelid: Secondary | ICD-10-CM | POA: Diagnosis not present

## 2016-08-01 DIAGNOSIS — I10 Essential (primary) hypertension: Secondary | ICD-10-CM | POA: Diagnosis not present

## 2016-08-01 DIAGNOSIS — E1165 Type 2 diabetes mellitus with hyperglycemia: Secondary | ICD-10-CM | POA: Diagnosis not present

## 2016-08-01 DIAGNOSIS — E049 Nontoxic goiter, unspecified: Secondary | ICD-10-CM | POA: Diagnosis not present

## 2016-08-01 DIAGNOSIS — E7800 Pure hypercholesterolemia, unspecified: Secondary | ICD-10-CM | POA: Diagnosis not present

## 2016-11-14 DIAGNOSIS — N3091 Cystitis, unspecified with hematuria: Secondary | ICD-10-CM | POA: Diagnosis not present

## 2016-11-14 DIAGNOSIS — N3000 Acute cystitis without hematuria: Secondary | ICD-10-CM | POA: Diagnosis not present

## 2017-02-02 DIAGNOSIS — E049 Nontoxic goiter, unspecified: Secondary | ICD-10-CM | POA: Diagnosis not present

## 2017-02-02 DIAGNOSIS — I10 Essential (primary) hypertension: Secondary | ICD-10-CM | POA: Diagnosis not present

## 2017-02-02 DIAGNOSIS — E7800 Pure hypercholesterolemia, unspecified: Secondary | ICD-10-CM | POA: Diagnosis not present

## 2017-02-02 DIAGNOSIS — E1165 Type 2 diabetes mellitus with hyperglycemia: Secondary | ICD-10-CM | POA: Diagnosis not present

## 2017-02-07 ENCOUNTER — Other Ambulatory Visit: Payer: Self-pay

## 2017-02-07 DIAGNOSIS — I70213 Atherosclerosis of native arteries of extremities with intermittent claudication, bilateral legs: Secondary | ICD-10-CM

## 2017-02-20 ENCOUNTER — Encounter: Payer: Self-pay | Admitting: Surgery

## 2017-02-20 ENCOUNTER — Ambulatory Visit (INDEPENDENT_AMBULATORY_CARE_PROVIDER_SITE_OTHER): Payer: BLUE CROSS/BLUE SHIELD | Admitting: Surgery

## 2017-02-20 ENCOUNTER — Ambulatory Visit (HOSPITAL_COMMUNITY)
Admission: RE | Admit: 2017-02-20 | Discharge: 2017-02-20 | Disposition: A | Discharge status: Home / Self Care | Payer: BLUE CROSS/BLUE SHIELD | Source: Ambulatory Visit | Attending: Surgery | Admitting: Surgery

## 2017-02-20 ENCOUNTER — Encounter: Payer: Self-pay | Admitting: *Deleted

## 2017-02-20 VITALS — BP 146/70 | HR 92 | Temp 97.0°F | Resp 18 | Ht 68.0 in | Wt 263.0 lb

## 2017-02-20 DIAGNOSIS — I739 Peripheral vascular disease, unspecified: Secondary | ICD-10-CM | POA: Diagnosis not present

## 2017-02-20 DIAGNOSIS — I70213 Atherosclerosis of native arteries of extremities with intermittent claudication, bilateral legs: Secondary | ICD-10-CM

## 2017-02-20 DIAGNOSIS — I7025 Atherosclerosis of native arteries of other extremities with ulceration: Secondary | ICD-10-CM | POA: Diagnosis not present

## 2017-02-20 DIAGNOSIS — R9439 Abnormal result of other cardiovascular function study: Secondary | ICD-10-CM | POA: Insufficient documentation

## 2017-02-20 NOTE — H&P (View-Only) (Signed)
[facility] [place]  Patient name: [name] [name] MRN: [id number] DOB: 1956-02-19 Sex: female   REQUESTING PROVIDER:    Dr. Chalmers Cater   REASON FOR CONSULT:    Claudication  HISTORY OF PRESENT ILLNESS:   [name] [name] is a 61 y.o. female, who is referred today for evaluation of bilateral leg pain.  She has been experiencing this for several months but is getting worse.  She states that she has trouble walking more than 200 feet.  Her right leg bothers her more than the left.  Her symptoms are improved with rest.  The patient suffers from plaque psoriasis.  She is waiting on medication.  She has active disease in both legs.  She suffers from diabetes.  This is not been well controlled recently as her A1c was 8.2.  She is medically managed for hypercholesterolemia with a statin.  She is a current smoker.  PAST MEDICAL HISTORY    Past Medical History:  Diagnosis Date  . Abdominal abscess   . Allergic rhinitis   . Asthma   . Carotid stenosis, left 04/2009   50-69%  . Colon polyps 12/05  . COPD (chronic obstructive pulmonary disease) ([place])   . Depression   . Diabetes mellitus    followed by Dr. Chalmers Cater  . Folate deficiency   . Hyperlipidemia   . Hypertension   . Lumbar disc disease    h/o hnp with repair  . Obesity, morbid (more than 100 lbs over ideal weight or BMI > 40) ([facility])   . Thrombocytopenia ([place])    work up with Dr. Griffith Citron neg in 2000  . Tobacco abuse      FAMILY HISTORY   Family History  Problem Relation Age of Onset  . Asthma Mother   . Hypertension Mother   . Stroke Father   . Alcohol abuse Father   . Cancer Sister        brain stem tumor  . Suicidality Sister   . Cancer Sister        breast  . Suicidality Brother   . Dementia Sister     SOCIAL HISTORY:   Social History   Socioeconomic History  . Marital status: Divorced    Spouse name: Not on file  . Number of children: 2  . Years of  education: HS grad  . Highest education level: Not on file  Social Needs  . Financial resource strain: Not on file  . Food insecurity - worry: Not on file  . Food insecurity - inability: Not on file  . Transportation needs - medical: Not on file  . Transportation needs - non-medical: Not on file  Occupational History  . Occupation: QUALITY MANAGER    Employer: [employer]  Tobacco Use  . Smoking status: Current Every Day Smoker    Packs/day: 1.00    Years: 37.00    Pack years: 37.00    Types: Cigarettes  . Smokeless tobacco: Never Used  Substance and Sexual Activity  . Alcohol use: No  . Drug use: No  . Sexual activity: Not Currently    Partners: Male    Birth control/protection: IUD  Other Topics Concern  . Not on file  Social History Narrative  . Not on file    ALLERGIES:    Allergies  Allergen Reactions  . Iohexol      Desc: ANAPHYLACTIC REACTION   . Penicillins Rash  . Sulfa Antibiotics Rash    CURRENT MEDICATIONS:  Current Outpatient Medications  Medication Sig Dispense Refill  . albuterol (PROVENTIL HFA;VENTOLIN HFA) 108 (90 BASE) MCG/ACT inhaler Inhale 2 puffs into the lungs every 6 (six) hours as needed for wheezing.    Marland Kitchen aspirin 81 MG EC tablet Take 81 mg by mouth daily.      Marland Kitchen atorvastatin (LIPITOR) 20 MG tablet TAKE 1 TABLET DAILY 90 tablet 0  . Cholecalciferol (VITAMIN D3) 1000 UNITS CAPS Take 1 capsule by mouth 2 (two) times daily.      . Cinnamon 500 MG TABS Take 500 mg by mouth 2 (two) times daily.    . folic acid (FOLVITE) 557 MCG tablet Take 400 mcg by mouth daily.    . hydrochlorothiazide (HYDRODIURIL) 25 MG tablet TAKE 1 TABLET DAILY 30 tablet 2  . lisinopril (PRINIVIL,ZESTRIL) 5 MG tablet Take 5 mg by mouth daily.    . metFORMIN (GLUCOPHAGE) 1000 MG tablet Take 1,000 mg by mouth 2 (two) times daily with a meal.    . sertraline (ZOLOFT) 100 MG tablet Take 100 mg by mouth at bedtime.    . budesonide-formoterol (SYMBICORT) 160-4.5  MCG/ACT inhaler Inhale 2 puffs into the lungs 2 (two) times daily. (Patient not taking: Reported on 02/20/2017) 1 Inhaler 3  . buPROPion (WELLBUTRIN XL) 300 MG 24 hr tablet TAKE 1 TABLET DAILY (Patient not taking: Reported on 02/20/2017) 90 tablet 3  . fluticasone (FLONASE) 50 MCG/ACT nasal spray Place 2 sprays into the nose daily. (Patient not taking: Reported on 02/20/2017) 16 g 6  . guaiFENesin-codeine 100-10 MG/5ML syrup Take 5 mLs by mouth at bedtime as needed for cough. (Patient not taking: Reported on 02/20/2017) 120 mL 0  . insulin aspart protamine - aspart (NOVOLOG MIX 70/30 FLEXPEN) (70-30) 100 UNIT/ML FlexPen Inject 60 Units into the skin 2 (two) times daily.    . insulin lispro (HUMALOG) 100 UNIT/ML injection Inject 10 Units into the skin 2 (two) times daily.    Marland Kitchen levofloxacin (LEVAQUIN) 750 MG tablet Take 1 tablet (750 mg total) by mouth daily. (Patient not taking: Reported on 02/20/2017) 10 tablet 0  . potassium chloride SA (K-DUR,KLOR-CON) 20 MEQ tablet Take 1 tablet (20 mEq total) by mouth daily. (Patient not taking: Reported on 02/20/2017) 30 tablet 0  . predniSONE (DELTASONE) 20 MG tablet Take two tablets daily for 3 days then one tablet daily for 3 days then stop (Patient not taking: Reported on 02/20/2017) 9 tablet 0  . predniSONE (DELTASONE) 50 MG tablet Take 1 tablet (50 mg total) by mouth daily. (Patient not taking: Reported on 02/20/2017) 5 tablet 0  . ramipril (ALTACE) 10 MG capsule TAKE 1 CAPSULE TWICE A DAY (Patient not taking: Reported on 02/20/2017) 180 capsule 0  . sertraline (ZOLOFT) 100 MG tablet AT THE START OF THERAPY, TAKE 1 TABLET AT BEDTIME FOR 1 WEEK, THEN 2 TABLETS DAILY THEREAFTER (Patient not taking: Reported on 02/20/2017) 180 tablet 1  . sertraline (ZOLOFT) 100 MG tablet TAKE 2 TABLETS DAILY (Patient not taking: Reported on 02/20/2017) 180 tablet 1  . sitaGLIPtan-metformin (JANUMET) 50-1000 MG per tablet Take 1 tablet by mouth 2 (two) times daily with a meal.     No  current facility-administered medications for this visit.     REVIEW OF SYSTEMS:   [X]  denotes positive finding, [ ]  denotes negative finding Cardiac  Comments:  Chest pain or chest pressure:    Shortness of breath upon exertion:    Short of breath when lying flat:    Irregular heart rhythm:  Vascular    Pain in calf, thigh, or hip brought on by ambulation: x   Pain in feet at night that wakes you up from your sleep:  x   Blood clot in your veins:    Leg swelling:         Pulmonary    Oxygen at home:    Productive cough:     Wheezing:  x       Neurologic    Sudden weakness in arms or legs:  x   Sudden numbness in arms or legs:  x   Sudden onset of difficulty speaking or slurred speech:    Temporary loss of vision in one eye:     Problems with dizziness:         Gastrointestinal    Blood in stool:      Vomited blood:         Genitourinary    Burning when urinating:     Blood in urine:        Psychiatric    Major depression:         Hematologic    Bleeding problems:    Problems with blood clotting too easily:        Skin    Rashes or ulcers:        Constitutional    Fever or chills:     PHYSICAL EXAM:   Vitals:   02/20/17 1210 02/20/17 1212  BP: (!) 145/69 (!) 146/70  Pulse: 92   Resp: 18   Temp: (!) 97 F (36.1 C)   TempSrc: Oral   SpO2: 93%   Weight: 263 lb (119.3 kg)   Height: 5\' 8"  (1.727 m)     GENERAL: The patient is a well-nourished female, in no acute distress. The vital signs are documented above. CARDIAC: There is a regular rate and rhythm.  VASCULAR: I cannot palpate a right femoral pulse.  I could palpate a left femoral pulse.  Pedal pulses are not palpable PULMONARY: Nonlabored respirations ABDOMEN: Soft and non-tender with normal pitched bowel sounds.  MUSCULOSKELETAL: There are no major deformities or cyanosis. NEUROLOGIC: No focal weakness or paresthesias are detected. SKIN: Plaque psoriasis noted in both legs with  surrounding erythema.  Small open wounds are noted PSYCHIATRIC: The patient has a normal affect.  STUDIES:   I have reviewed her vascular studies.  Her ABI is 0.21 on the right and 0.28 on the left  ASSESSMENT and PLAN   Severe atherosclerosis with ulcers: I discussed with the patient that she needs to get control of her medical issues as soon as possible.  This includes smoking cessation and better diabetes control, as well as exercise and weight loss.  I also think because of her significant claudication symptoms and her open wounds in her legs that we need to further evaluate her blood flow.  I think that we can perform an abdominal aortogram via a left femoral approach to get a roadmap for vascular disease.  I will try to intervene, based on the results of angiography.  I have scheduled this for Tuesday, December 11.   [name], MD [facility] [phone] Pager [phone]

## 2017-02-20 NOTE — Progress Notes (Signed)
[facility] [place]  Patient name: [name] [name] MRN: [id number] DOB: 1955-12-23 Sex: female   REQUESTING PROVIDER:    Dr. Chalmers Cater   REASON FOR CONSULT:    Claudication  HISTORY OF PRESENT ILLNESS:   [name] [name] is a 61 y.o. female, who is referred today for evaluation of bilateral leg pain.  She has been experiencing this for several months but is getting worse.  She states that she has trouble walking more than 200 feet.  Her right leg bothers her more than the left.  Her symptoms are improved with rest.  The patient suffers from plaque psoriasis.  She is waiting on medication.  She has active disease in both legs.  She suffers from diabetes.  This is not been well controlled recently as her A1c was 8.2.  She is medically managed for hypercholesterolemia with a statin.  She is a current smoker.  PAST MEDICAL HISTORY    Past Medical History:  Diagnosis Date  . Abdominal abscess   . Allergic rhinitis   . Asthma   . Carotid stenosis, left 04/2009   50-69%  . Colon polyps 12/05  . COPD (chronic obstructive pulmonary disease) ([place])   . Depression   . Diabetes mellitus    followed by Dr. Chalmers Cater  . Folate deficiency   . Hyperlipidemia   . Hypertension   . Lumbar disc disease    h/o hnp with repair  . Obesity, morbid (more than 100 lbs over ideal weight or BMI > 40) ([facility])   . Thrombocytopenia ([place])    work up with Dr. Griffith Citron neg in 2000  . Tobacco abuse      FAMILY HISTORY   Family History  Problem Relation Age of Onset  . Asthma Mother   . Hypertension Mother   . Stroke Father   . Alcohol abuse Father   . Cancer Sister        brain stem tumor  . Suicidality Sister   . Cancer Sister        breast  . Suicidality Brother   . Dementia Sister     SOCIAL HISTORY:   Social History   Socioeconomic History  . Marital status: Divorced    Spouse name: Not on file  . Number of children: 2  . Years of  education: HS grad  . Highest education level: Not on file  Social Needs  . Financial resource strain: Not on file  . Food insecurity - worry: Not on file  . Food insecurity - inability: Not on file  . Transportation needs - medical: Not on file  . Transportation needs - non-medical: Not on file  Occupational History  . Occupation: QUALITY MANAGER    Employer: [employer]  Tobacco Use  . Smoking status: Current Every Day Smoker    Packs/day: 1.00    Years: 37.00    Pack years: 37.00    Types: Cigarettes  . Smokeless tobacco: Never Used  Substance and Sexual Activity  . Alcohol use: No  . Drug use: No  . Sexual activity: Not Currently    Partners: Male    Birth control/protection: IUD  Other Topics Concern  . Not on file  Social History Narrative  . Not on file    ALLERGIES:    Allergies  Allergen Reactions  . Iohexol      Desc: ANAPHYLACTIC REACTION   . Penicillins Rash  . Sulfa Antibiotics Rash    CURRENT MEDICATIONS:  Current Outpatient Medications  Medication Sig Dispense Refill  . albuterol (PROVENTIL HFA;VENTOLIN HFA) 108 (90 BASE) MCG/ACT inhaler Inhale 2 puffs into the lungs every 6 (six) hours as needed for wheezing.    Marland Kitchen aspirin 81 MG EC tablet Take 81 mg by mouth daily.      Marland Kitchen atorvastatin (LIPITOR) 20 MG tablet TAKE 1 TABLET DAILY 90 tablet 0  . Cholecalciferol (VITAMIN D3) 1000 UNITS CAPS Take 1 capsule by mouth 2 (two) times daily.      . Cinnamon 500 MG TABS Take 500 mg by mouth 2 (two) times daily.    . folic acid (FOLVITE) 361 MCG tablet Take 400 mcg by mouth daily.    . hydrochlorothiazide (HYDRODIURIL) 25 MG tablet TAKE 1 TABLET DAILY 30 tablet 2  . lisinopril (PRINIVIL,ZESTRIL) 5 MG tablet Take 5 mg by mouth daily.    . metFORMIN (GLUCOPHAGE) 1000 MG tablet Take 1,000 mg by mouth 2 (two) times daily with a meal.    . sertraline (ZOLOFT) 100 MG tablet Take 100 mg by mouth at bedtime.    . budesonide-formoterol (SYMBICORT) 160-4.5  MCG/ACT inhaler Inhale 2 puffs into the lungs 2 (two) times daily. (Patient not taking: Reported on 02/20/2017) 1 Inhaler 3  . buPROPion (WELLBUTRIN XL) 300 MG 24 hr tablet TAKE 1 TABLET DAILY (Patient not taking: Reported on 02/20/2017) 90 tablet 3  . fluticasone (FLONASE) 50 MCG/ACT nasal spray Place 2 sprays into the nose daily. (Patient not taking: Reported on 02/20/2017) 16 g 6  . guaiFENesin-codeine 100-10 MG/5ML syrup Take 5 mLs by mouth at bedtime as needed for cough. (Patient not taking: Reported on 02/20/2017) 120 mL 0  . insulin aspart protamine - aspart (NOVOLOG MIX 70/30 FLEXPEN) (70-30) 100 UNIT/ML FlexPen Inject 60 Units into the skin 2 (two) times daily.    . insulin lispro (HUMALOG) 100 UNIT/ML injection Inject 10 Units into the skin 2 (two) times daily.    Marland Kitchen levofloxacin (LEVAQUIN) 750 MG tablet Take 1 tablet (750 mg total) by mouth daily. (Patient not taking: Reported on 02/20/2017) 10 tablet 0  . potassium chloride SA (K-DUR,KLOR-CON) 20 MEQ tablet Take 1 tablet (20 mEq total) by mouth daily. (Patient not taking: Reported on 02/20/2017) 30 tablet 0  . predniSONE (DELTASONE) 20 MG tablet Take two tablets daily for 3 days then one tablet daily for 3 days then stop (Patient not taking: Reported on 02/20/2017) 9 tablet 0  . predniSONE (DELTASONE) 50 MG tablet Take 1 tablet (50 mg total) by mouth daily. (Patient not taking: Reported on 02/20/2017) 5 tablet 0  . ramipril (ALTACE) 10 MG capsule TAKE 1 CAPSULE TWICE A DAY (Patient not taking: Reported on 02/20/2017) 180 capsule 0  . sertraline (ZOLOFT) 100 MG tablet AT THE START OF THERAPY, TAKE 1 TABLET AT BEDTIME FOR 1 WEEK, THEN 2 TABLETS DAILY THEREAFTER (Patient not taking: Reported on 02/20/2017) 180 tablet 1  . sertraline (ZOLOFT) 100 MG tablet TAKE 2 TABLETS DAILY (Patient not taking: Reported on 02/20/2017) 180 tablet 1  . sitaGLIPtan-metformin (JANUMET) 50-1000 MG per tablet Take 1 tablet by mouth 2 (two) times daily with a meal.     No  current facility-administered medications for this visit.     REVIEW OF SYSTEMS:   [X]  denotes positive finding, [ ]  denotes negative finding Cardiac  Comments:  Chest pain or chest pressure:    Shortness of breath upon exertion:    Short of breath when lying flat:    Irregular heart rhythm:  Vascular    Pain in calf, thigh, or hip brought on by ambulation: x   Pain in feet at night that wakes you up from your sleep:  x   Blood clot in your veins:    Leg swelling:         Pulmonary    Oxygen at home:    Productive cough:     Wheezing:  x       Neurologic    Sudden weakness in arms or legs:  x   Sudden numbness in arms or legs:  x   Sudden onset of difficulty speaking or slurred speech:    Temporary loss of vision in one eye:     Problems with dizziness:         Gastrointestinal    Blood in stool:      Vomited blood:         Genitourinary    Burning when urinating:     Blood in urine:        Psychiatric    Major depression:         Hematologic    Bleeding problems:    Problems with blood clotting too easily:        Skin    Rashes or ulcers:        Constitutional    Fever or chills:     PHYSICAL EXAM:   Vitals:   02/20/17 1210 02/20/17 1212  BP: (!) 145/69 (!) 146/70  Pulse: 92   Resp: 18   Temp: (!) 97 F (36.1 C)   TempSrc: Oral   SpO2: 93%   Weight: 263 lb (119.3 kg)   Height: 5\' 8"  (1.727 m)     GENERAL: The patient is a well-nourished female, in no acute distress. The vital signs are documented above. CARDIAC: There is a regular rate and rhythm.  VASCULAR: I cannot palpate a right femoral pulse.  I could palpate a left femoral pulse.  Pedal pulses are not palpable PULMONARY: Nonlabored respirations ABDOMEN: Soft and non-tender with normal pitched bowel sounds.  MUSCULOSKELETAL: There are no major deformities or cyanosis. NEUROLOGIC: No focal weakness or paresthesias are detected. SKIN: Plaque psoriasis noted in both legs with  surrounding erythema.  Small open wounds are noted PSYCHIATRIC: The patient has a normal affect.  STUDIES:   I have reviewed her vascular studies.  Her ABI is 0.21 on the right and 0.28 on the left  ASSESSMENT and PLAN   Severe atherosclerosis with ulcers: I discussed with the patient that she needs to get control of her medical issues as soon as possible.  This includes smoking cessation and better diabetes control, as well as exercise and weight loss.  I also think because of her significant claudication symptoms and her open wounds in her legs that we need to further evaluate her blood flow.  I think that we can perform an abdominal aortogram via a left femoral approach to get a roadmap for vascular disease.  I will try to intervene, based on the results of angiography.  I have scheduled this for Tuesday, December 11.   [name], MD [facility] [phone] Pager [phone]

## 2017-02-21 ENCOUNTER — Other Ambulatory Visit: Payer: Self-pay | Admitting: *Deleted

## 2017-02-28 ENCOUNTER — Ambulatory Visit (HOSPITAL_COMMUNITY)
Admission: RE | Admit: 2017-02-28 | Discharge: 2017-02-28 | Disposition: A | Discharge status: Home / Self Care | Payer: BLUE CROSS/BLUE SHIELD | Source: Ambulatory Visit | Attending: Surgery | Admitting: Surgery

## 2017-02-28 ENCOUNTER — Encounter (HOSPITAL_COMMUNITY): Payer: Self-pay | Admitting: *Deleted

## 2017-02-28 ENCOUNTER — Encounter (HOSPITAL_COMMUNITY)
Admission: RE | Disposition: A | Discharge status: Home / Self Care | Payer: Self-pay | Source: Ambulatory Visit | Attending: Surgery

## 2017-02-28 DIAGNOSIS — Z882 Allergy status to sulfonamides status: Secondary | ICD-10-CM | POA: Insufficient documentation

## 2017-02-28 DIAGNOSIS — I70249 Atherosclerosis of native arteries of left leg with ulceration of unspecified site: Secondary | ICD-10-CM | POA: Diagnosis not present

## 2017-02-28 DIAGNOSIS — F17210 Nicotine dependence, cigarettes, uncomplicated: Secondary | ICD-10-CM | POA: Insufficient documentation

## 2017-02-28 DIAGNOSIS — I10 Essential (primary) hypertension: Secondary | ICD-10-CM | POA: Diagnosis not present

## 2017-02-28 DIAGNOSIS — Z794 Long term (current) use of insulin: Secondary | ICD-10-CM | POA: Insufficient documentation

## 2017-02-28 DIAGNOSIS — Z880 Allergy status to penicillin: Secondary | ICD-10-CM | POA: Insufficient documentation

## 2017-02-28 DIAGNOSIS — E1151 Type 2 diabetes mellitus with diabetic peripheral angiopathy without gangrene: Secondary | ICD-10-CM | POA: Diagnosis not present

## 2017-02-28 DIAGNOSIS — E7800 Pure hypercholesterolemia, unspecified: Secondary | ICD-10-CM | POA: Diagnosis not present

## 2017-02-28 DIAGNOSIS — J449 Chronic obstructive pulmonary disease, unspecified: Secondary | ICD-10-CM | POA: Insufficient documentation

## 2017-02-28 DIAGNOSIS — I6522 Occlusion and stenosis of left carotid artery: Secondary | ICD-10-CM | POA: Diagnosis not present

## 2017-02-28 DIAGNOSIS — L97919 Non-pressure chronic ulcer of unspecified part of right lower leg with unspecified severity: Secondary | ICD-10-CM | POA: Diagnosis not present

## 2017-02-28 DIAGNOSIS — I70239 Atherosclerosis of native arteries of right leg with ulceration of unspecified site: Secondary | ICD-10-CM | POA: Diagnosis not present

## 2017-02-28 DIAGNOSIS — F329 Major depressive disorder, single episode, unspecified: Secondary | ICD-10-CM | POA: Diagnosis not present

## 2017-02-28 DIAGNOSIS — Z6841 Body Mass Index (BMI) 40.0 and over, adult: Secondary | ICD-10-CM | POA: Diagnosis not present

## 2017-02-28 DIAGNOSIS — Z7982 Long term (current) use of aspirin: Secondary | ICD-10-CM | POA: Insufficient documentation

## 2017-02-28 DIAGNOSIS — L400 Psoriasis vulgaris: Secondary | ICD-10-CM | POA: Insufficient documentation

## 2017-02-28 DIAGNOSIS — L97929 Non-pressure chronic ulcer of unspecified part of left lower leg with unspecified severity: Secondary | ICD-10-CM | POA: Diagnosis not present

## 2017-02-28 DIAGNOSIS — I70223 Atherosclerosis of native arteries of extremities with rest pain, bilateral legs: Secondary | ICD-10-CM | POA: Diagnosis not present

## 2017-02-28 DIAGNOSIS — Z7951 Long term (current) use of inhaled steroids: Secondary | ICD-10-CM | POA: Diagnosis not present

## 2017-02-28 HISTORY — PX: ABDOMINAL AORTOGRAM W/LOWER EXTREMITY: CATH118223

## 2017-02-28 LAB — GLUCOSE, CAPILLARY
GLUCOSE-CAPILLARY: 140 mg/dL — AB (ref 65–99)
Glucose-Capillary: 125 mg/dL — ABNORMAL HIGH (ref 65–99)

## 2017-02-28 LAB — POCT I-STAT, CHEM 8
BUN: 9 mg/dL (ref 6–20)
CALCIUM ION: 1.15 mmol/L (ref 1.15–1.40)
Chloride: 97 mmol/L — ABNORMAL LOW (ref 101–111)
Creatinine, Ser: 0.5 mg/dL (ref 0.44–1.00)
GLUCOSE: 125 mg/dL — AB (ref 65–99)
HEMATOCRIT: 48 % — AB (ref 36.0–46.0)
HEMOGLOBIN: 16.3 g/dL — AB (ref 12.0–15.0)
POTASSIUM: 4 mmol/L (ref 3.5–5.1)
SODIUM: 136 mmol/L (ref 135–145)
TCO2: 26 mmol/L (ref 22–32)

## 2017-02-28 SURGERY — ABDOMINAL AORTOGRAM W/LOWER EXTREMITY
Anesthesia: LOCAL

## 2017-02-28 MED ORDER — METHYLPREDNISOLONE SODIUM SUCC 125 MG IJ SOLR
INTRAMUSCULAR | Status: AC
  Filled 2017-02-28: qty 2

## 2017-02-28 MED ORDER — OXYCODONE HCL 5 MG PO TABS
ORAL_TABLET | ORAL | Status: AC
  Administered 2017-02-28: 10 mg via ORAL
  Filled 2017-02-28: qty 1

## 2017-02-28 MED ORDER — MORPHINE SULFATE (PF) 10 MG/ML IV SOLN: 2.0000 mg | INTRAVENOUS | Status: DC | PRN

## 2017-02-28 MED ORDER — SODIUM CHLORIDE 0.9% FLUSH: 3.0000 mL | Freq: Two times a day (BID) | INTRAVENOUS | Status: DC

## 2017-02-28 MED ORDER — DIPHENHYDRAMINE HCL 50 MG/ML IJ SOLN
INTRAMUSCULAR | Status: AC
  Administered 2017-02-28: 25 mg via INTRAVENOUS
  Filled 2017-02-28: qty 1

## 2017-02-28 MED ORDER — SODIUM CHLORIDE 0.9 % IV SOLN
250.0000 mL | INTRAVENOUS | Status: DC | PRN
Start: 2017-02-28 — End: 2017-02-28

## 2017-02-28 MED ORDER — FENTANYL CITRATE (PF) 100 MCG/2ML IJ SOLN
INTRAMUSCULAR | Status: DC | PRN
  Administered 2017-02-28 (×4): 25 ug via INTRAVENOUS

## 2017-02-28 MED ORDER — LABETALOL HCL 5 MG/ML IV SOLN: 10.0000 mg | INTRAVENOUS | Status: DC | PRN

## 2017-02-28 MED ORDER — OXYCODONE HCL 5 MG PO TABS
ORAL_TABLET | ORAL | Status: AC
  Filled 2017-02-28: qty 1

## 2017-02-28 MED ORDER — FAMOTIDINE IN NACL 20-0.9 MG/50ML-% IV SOLN
20.0000 mg | INTRAVENOUS | Status: AC
  Administered 2017-02-28: 20 mg via INTRAVENOUS

## 2017-02-28 MED ORDER — IODIXANOL 320 MG/ML IV SOLN
INTRAVENOUS | Status: DC | PRN
  Administered 2017-02-28: 150 mL via INTRA_ARTERIAL

## 2017-02-28 MED ORDER — HYDRALAZINE HCL 20 MG/ML IJ SOLN: 5.0000 mg | INTRAMUSCULAR | Status: DC | PRN

## 2017-02-28 MED ORDER — MIDAZOLAM HCL 2 MG/2ML IJ SOLN
INTRAMUSCULAR | Status: AC
  Filled 2017-02-28: qty 2

## 2017-02-28 MED ORDER — HEPARIN (PORCINE) IN NACL 2-0.9 UNIT/ML-% IJ SOLN
INTRAMUSCULAR | Status: AC
  Filled 2017-02-28: qty 500

## 2017-02-28 MED ORDER — SODIUM CHLORIDE 0.9 % WEIGHT BASED INFUSION: 1.0000 mL/kg/h | INTRAVENOUS | Status: DC

## 2017-02-28 MED ORDER — HEPARIN SODIUM (PORCINE) 1000 UNIT/ML IJ SOLN
INTRAMUSCULAR | Status: AC
  Filled 2017-02-28: qty 1

## 2017-02-28 MED ORDER — OXYCODONE HCL 5 MG PO TABS
5.0000 mg | ORAL_TABLET | ORAL | Status: DC | PRN
  Administered 2017-02-28: 10 mg via ORAL
  Filled 2017-02-28: qty 2

## 2017-02-28 MED ORDER — FENTANYL CITRATE (PF) 100 MCG/2ML IJ SOLN
INTRAMUSCULAR | Status: AC
  Filled 2017-02-28: qty 2

## 2017-02-28 MED ORDER — HEPARIN (PORCINE) IN NACL 2-0.9 UNIT/ML-% IJ SOLN
INTRAMUSCULAR | Status: AC | PRN
  Administered 2017-02-28: 1000 mL

## 2017-02-28 MED ORDER — OXYCODONE-ACETAMINOPHEN 5-325 MG PO TABS: 1.0000 | ORAL_TABLET | Freq: Four times a day (QID) | ORAL | 0 refills | Status: DC | PRN

## 2017-02-28 MED ORDER — SODIUM CHLORIDE 0.9 % IV SOLN
INTRAVENOUS | Status: DC
  Administered 2017-02-28: 11:00:00 via INTRAVENOUS

## 2017-02-28 MED ORDER — MIDAZOLAM HCL 2 MG/2ML IJ SOLN
INTRAMUSCULAR | Status: DC | PRN
  Administered 2017-02-28 (×2): 1 mg via INTRAVENOUS

## 2017-02-28 MED ORDER — FAMOTIDINE IN NACL 20-0.9 MG/50ML-% IV SOLN
INTRAVENOUS | Status: AC
  Filled 2017-02-28: qty 50

## 2017-02-28 MED ORDER — METHYLPREDNISOLONE SODIUM SUCC 125 MG IJ SOLR
125.0000 mg | INTRAMUSCULAR | Status: AC
  Administered 2017-02-28: 125 mg via INTRAVENOUS

## 2017-02-28 MED ORDER — DIPHENHYDRAMINE HCL 50 MG/ML IJ SOLN
25.0000 mg | INTRAMUSCULAR | Status: AC
  Administered 2017-02-28: 25 mg via INTRAVENOUS

## 2017-02-28 MED ORDER — SODIUM CHLORIDE 0.9% FLUSH: 3.0000 mL | INTRAVENOUS | Status: DC | PRN

## 2017-02-28 MED ORDER — LIDOCAINE HCL (PF) 1 % IJ SOLN
INTRAMUSCULAR | Status: DC | PRN
Start: 2017-02-28 — End: 2017-02-28
  Administered 2017-02-28: 20 mL

## 2017-02-28 MED ORDER — LIDOCAINE HCL (PF) 1 % IJ SOLN
INTRAMUSCULAR | Status: AC
  Filled 2017-02-28: qty 30

## 2017-02-28 SURGICAL SUPPLY — 13 items
CATH OMNI FLUSH 5F 65CM (CATHETERS) ×1 IMPLANT
CATH SOFT-VU 4F 65 STRAIGHT (CATHETERS) IMPLANT
CATH SOFT-VU STRAIGHT 4F 65CM (CATHETERS) ×2
COVER PRB 48X5XTLSCP FOLD TPE (BAG) IMPLANT
COVER PROBE 5X48 (BAG) ×2
KIT MICROINTRODUCER STIFF 5F (SHEATH) ×1 IMPLANT
KIT PV (KITS) ×2 IMPLANT
SHEATH PINNACLE 5F 10CM (SHEATH) ×1 IMPLANT
SYR MEDRAD MARK V 150ML (SYRINGE) ×2 IMPLANT
TRANSDUCER W/STOPCOCK (MISCELLANEOUS) ×2 IMPLANT
TRAY PV CATH (CUSTOM PROCEDURE TRAY) ×2 IMPLANT
WIRE BENTSON .035X145CM (WIRE) ×1 IMPLANT
WIRE TORQFLEX AUST .018X40CM (WIRE) ×1 IMPLANT

## 2017-02-28 NOTE — Progress Notes (Signed)
Up and walked and tolerated well; left groin stable, no bleeding or hematoma 

## 2017-02-28 NOTE — Progress Notes (Signed)
Left femoral artery sheath removed and pressure held for 20 minutes. Left groin level zero with distal left posterior tibial and pedal pulses dopplerable. No apparent complications. Downtime starts at 1330 for for 4 hours.

## 2017-02-28 NOTE — Op Note (Signed)
    Patient name: [name] [name] MRN: [id number] DOB: Sep 12, 1955 Sex: female  02/28/2017 Pre-operative Diagnosis: Bilateral leg pain with ulcers Post-operative diagnosis:  Same Surgeon:  [name] Procedure Performed:  1.  Ultrasound-guided access, left femoral artery  2.  Abdominal aortogram  3.  Second-order catheterization  4.  Bilateral lower extremity runoff  5.  Conscious sedation (> 15 minutes)     Indications: Duplex showed severely diminished ABIs.  She has nonhealing ulcers from plaque psoriasis.  She is here today for angiography the patient is experiencing bilateral lower extremity pain, bordering on rest pain.  Procedure:  The patient was identified in the holding area and taken to room 8.  The patient was then placed supine on the table and prepped and draped in the usual sterile fashion.  A time out was called.  Conscious sedation was administered with the use of IV fentanyl and Versed under continuous physician and nurse monitoring.  Heart rate, blood pressure, and oxygen saturations were continuously monitored.  Ultrasound was used to evaluate the left common femoral artery.  It was patent .  A digital ultrasound image was acquired.  A micropuncture needle was used to access the left common femoral artery under ultrasound guidance.  An 018 wire was advanced without resistance and a micropuncture sheath was placed.  The 018 wire was removed and a benson wire was placed.  The micropuncture sheath was exchanged for a 5 french sheath.  An omniflush catheter was advanced over the wire to the level of L-1.  An abdominal angiogram was obtained.  Next, using the omniflush catheter and a benson wire, the aortic bifurcation was crossed and the catheter was placed into theright external iliac artery and right runoff was obtained.  left runoff was performed via retrograde sheath injections.  Findings:   Aortogram:   No significant renal artery stenosis.  The infrarenal abdominal aorta  is non-aneurysmal and widely patent.  Bilateral common iliac arteries are patent throughout their course.  Right Lower Extremity: Calcific plaque with greater than 80% stenosis noted in the right common femoral artery.  The profundofemoral artery is patent throughout its course.  The superficial femoral artery is occluded at its origin with reconstitution of the above-knee popliteal artery which is disease.  The below-knee popliteal artery is patent without significant stenosis.  There is three-vessel runoff.  The anterior tibial artery does appear to occlude down near the ankle.  Left Lower Extremity: The left common femoral and profunda femoral artery are widely patent.  The superficial femoral artery is occluded with reconstitution at the level of the adductor canal however this is diseased down to the level of the patella.  The below-knee popliteal artery is patent throughout its course with three-vessel runoff  Intervention: None  Impression:  #1 no significant aortoiliac occlusive disease.  #2 high-grade calcific stenotic lesions in the right common femoral artery with superficial femoral artery occlusion.  There is reconstitution of the popliteal artery with three-vessel runoff to the ankle.  Consideration for right femoral endarterectomy with right femoral to below-knee popliteal artery bypass.  #3 occluded left superficial femoral artery with reconstitution of the above-knee popliteal artery.  Consideration for a left femoral to above versus below-knee popliteal artery.     Theotis Burrow, M.D. [facility] [place] Office: [phone] Pager:  [phone]

## 2017-02-28 NOTE — Discharge Instructions (Signed)
NO METFORMIN/GLUCOPHAGE FOR 2 DAYS    Femoral Site Care Refer to this sheet in the next few weeks. These instructions provide you with information about caring for yourself after your procedure. Your health care provider may also give you more specific instructions. Your treatment has been planned according to current medical practices, but problems sometimes occur. Call your health care provider if you have any problems or questions after your procedure. What can I expect after the procedure? After your procedure, it is typical to have the following:  Bruising at the site that usually fades within 1-2 weeks.  Blood collecting in the tissue (hematoma) that may be painful to the touch. It should usually decrease in size and tenderness within 1-2 weeks.  Follow these instructions at home:  Take medicines only as directed by your health care provider.  You may shower 24-48 hours after the procedure or as directed by your health care provider. Remove the bandage (dressing) and gently wash the site with plain soap and water. Pat the area dry with a clean towel. Do not rub the site, because this may cause bleeding.  Do not take baths, swim, or use a hot tub until your health care provider approves.  Check your insertion site every day for redness, swelling, or drainage.  Do not apply powder or lotion to the site.  Limit use of stairs to twice a day for the first 2-3 days or as directed by your health care provider.  Do not squat for the first 2-3 days or as directed by your health care provider.  Do not lift over 10 lb (4.5 kg) for 5 days after your procedure or as directed by your health care provider.  Ask your health care provider when it is okay to: ? Return to work or school. ? Resume usual physical activities or sports. ? Resume sexual activity.  Do not drive home if you are discharged the same day as the procedure. Have someone else drive you.  You may drive 24 hours after the  procedure unless otherwise instructed by your health care provider.  Do not operate machinery or power tools for 24 hours after the procedure or as directed by your health care provider.  If your procedure was done as an outpatient procedure, which means that you went home the same day as your procedure, a responsible adult should be with you for the first 24 hours after you arrive home.  Keep all follow-up visits as directed by your health care provider. This is important. Contact a health care provider if:  You have a fever.  You have chills.  You have increased bleeding from the site. Hold pressure on the site. Get help right away if:  You have unusual pain at the site.  You have redness, warmth, or swelling at the site.  You have drainage (other than a small amount of blood on the dressing) from the site.  The site is bleeding, and the bleeding does not stop after 30 minutes of holding steady pressure on the site.  Your leg or foot becomes pale, cool, tingly, or numb. This information is not intended to replace advice given to you by your health care provider. Make sure you discuss any questions you have with your health care provider. Document Released: 11/08/2013 Document Revised: 08/13/2015 Document Reviewed: 09/24/2013 Elsevier Interactive Patient Education  2018 [place]. Moderate Conscious Sedation, Adult, Care After These instructions provide you with information about caring for yourself after your procedure. Your  health care provider may also give you more specific instructions. Your treatment has been planned according to current medical practices, but problems sometimes occur. Call your health care provider if you have any problems or questions after your procedure. What can I expect after the procedure? After your procedure, it is common:  To feel sleepy for several hours.  To feel clumsy and have poor balance for several hours.  To have poor judgment for  several hours.  To vomit if you eat too soon.  Follow these instructions at home: For at least 24 hours after the procedure:   Do not: ? Participate in activities where you could fall or become injured. ? Drive. ? Use heavy machinery. ? Drink alcohol. ? Take sleeping pills or medicines that cause drowsiness. ? Make important decisions or sign legal documents. ? Take care of children on your own.  Rest. Eating and drinking  Follow the diet recommended by your health care provider.  If you vomit: ? Drink water, juice, or soup when you can drink without vomiting. ? Make sure you have little or no nausea before eating solid foods. General instructions  Have a responsible adult stay with you until you are awake and alert.  Take over-the-counter and prescription medicines only as told by your health care provider.  If you smoke, do not smoke without supervision.  Keep all follow-up visits as told by your health care provider. This is important. Contact a health care provider if:  You keep feeling nauseous or you keep vomiting.  You feel light-headed.  You develop a rash.  You have a fever. Get help right away if:  You have trouble breathing. This information is not intended to replace advice given to you by your health care provider. Make sure you discuss any questions you have with your health care provider. Document Released: 12/26/2012 Document Revised: 08/10/2015 Document Reviewed: 06/27/2015 Elsevier Interactive Patient Education  [employer].

## 2017-02-28 NOTE — Interval H&P Note (Signed)
History and Physical Interval Note:  02/28/2017 11:29 AM  [name] [name]  has presented today for surgery, with the diagnosis of pvd  The various methods of treatment have been discussed with the patient and family. After consideration of risks, benefits and other options for treatment, the patient has consented to  Procedure(s): ABDOMINAL AORTOGRAM W/LOWER EXTREMITY (N/A) as a surgical intervention .  The patient's history has been reviewed, patient examined, no change in status, stable for surgery.  I have reviewed the patient's chart and labs.  Questions were answered to the patient's satisfaction.     [name]

## 2017-03-01 ENCOUNTER — Encounter (HOSPITAL_COMMUNITY): Payer: Self-pay | Admitting: Surgery

## 2017-03-02 ENCOUNTER — Telehealth: Payer: Self-pay

## 2017-03-02 ENCOUNTER — Telehealth: Payer: Self-pay | Admitting: Surgery

## 2017-03-02 NOTE — Telephone Encounter (Signed)
Sched cardiac clearance 03/03/17 at [facility] [place] at 2:20. Sched carotid bilat 03/08/17 at 4:00. Sched GSV map + MD 03/13/17 at 8:00 and 8:30. Spoke to pt.

## 2017-03-02 NOTE — Telephone Encounter (Signed)
-----   Message from Mena Goes, RN sent at 03/01/2017 10:58 AM EST ----- Regarding: 2-3 weeks, cardio clearance, preop labs also   ----- Message ----- From: Serafina Mitchell, MD Sent: 02/28/2017   9:47 PM To: Vvs Charge Pool  02-28-2017 : Surgeon:  [name] Procedure Performed:  1.  Ultrasound-guided access, left femoral artery  2.  Abdominal aortogram  3.  Second-order catheterization  4.  Bilateral lower extremity runoff  5.  Conscious sedation (> 15 minutes)  Please schedule the patient to see me within the next 2-3 weeks for discussions regarding femoral-popliteal bypass graft.  Prior to her visit, she will need to be seen and evaluated for cardiac clearance by cardiology.  I would also like for her to have carotid Dopplers and bilateral lower extremity vein mapping performed.

## 2017-03-02 NOTE — Telephone Encounter (Signed)
Returned call to pt- stated she has been having excruciating pain in R heel since procedure on 12/11. Pt was prescribed 6 oxycodone on 12/11 by Gwenette Greet, [place].Marland Kitchen Pt states pain in continuous and is much worse than before the abdominal aortogram. Offered pt an appointment to come in and be examined by our NP, pt declined. Dr. Trula Slade was paged for advise. Pt verbalized understanding and agreed with plan.

## 2017-03-03 ENCOUNTER — Ambulatory Visit (INDEPENDENT_AMBULATORY_CARE_PROVIDER_SITE_OTHER): Payer: BLUE CROSS/BLUE SHIELD | Admitting: Cardiovascular Disease

## 2017-03-03 ENCOUNTER — Encounter: Payer: Self-pay | Admitting: Cardiovascular Disease

## 2017-03-03 DIAGNOSIS — R011 Cardiac murmur, unspecified: Secondary | ICD-10-CM | POA: Diagnosis not present

## 2017-03-03 DIAGNOSIS — Z01810 Encounter for preprocedural cardiovascular examination: Secondary | ICD-10-CM | POA: Diagnosis not present

## 2017-03-03 NOTE — Progress Notes (Signed)
Chief Complaint  Patient presents with  . New Patient (Initial Visit)    Pre-op evaluation   History of Present Illness: 61 yo female with history of COPD, DM, HLD, HTN, PAD and tobacco abuse who is here today as a new consult, referred by Dr. Trula Slade, for pre-op evaluation and cardiac risk assessment. She has no known CAD. She was recently found to have severe PAD with high grade calcific stenosis of the right CFA and right SFA occlusion, left SFA occlusion. Dr. Trula Slade is planning left femoral to above knee popliteal artery bypass surgery. She is here today for cardiac clearance.   She tells me today that she has severe pain in both legs. She has no chest pain. She denies has some dyspnea with exertion. She has been unable to exercise for the past year due to leg pain. She has no LE edema. She has smoked 1 ppd for 45 years and is still smoking.   Primary Care Physician: Jacelyn Pi, MD  Past Medical History:  Diagnosis Date  . Abdominal abscess   . Allergic rhinitis   . Asthma   . Carotid stenosis, left 04/2009   50-69%  . Colon polyps 12/05  . COPD (chronic obstructive pulmonary disease) ([facility])   . Depression   . Diabetes mellitus    followed by Dr. Chalmers Cater  . Folate deficiency   . Hyperlipidemia   . Hypertension   . Lumbar disc disease    h/o hnp with repair  . Obesity, morbid (more than 100 lbs over ideal weight or BMI > 40) ([facility])   . Thrombocytopenia ([place])    work up with Dr. Griffith Citron neg in 2000  . Tobacco abuse     Past Surgical History:  Procedure Laterality Date  . ABDOMINAL AORTOGRAM W/LOWER EXTREMITY N/A 02/28/2017   Procedure: ABDOMINAL AORTOGRAM W/LOWER EXTREMITY;  Surgeon: Serafina Mitchell, MD;  Location: [facility];  Service: Cardiovascular;  Laterality: N/A;  Bilateral  . BACK SURGERY  1989   LS  for HNP  . CHOLECYSTECTOMY  1982  . HERNIA REPAIR  2001   incisional  . INCISE AND DRAIN ABCESS  01/11/11   abd abscess    Current Outpatient  Medications  Medication Sig Dispense Refill  . albuterol (PROVENTIL HFA;VENTOLIN HFA) 108 (90 BASE) MCG/ACT inhaler Inhale 2 puffs into the lungs every 6 (six) hours as needed for wheezing.    Marland Kitchen aspirin 81 MG EC tablet Take 81 mg by mouth daily.      Marland Kitchen atorvastatin (LIPITOR) 20 MG tablet TAKE 1 TABLET DAILY 90 tablet 0  . Cholecalciferol (VITAMIN D3) 1000 UNITS CAPS Take 1,000 Units by mouth 2 (two) times daily.     Marland Kitchen CINNAMON PO Take 1,000 mg by mouth 2 (two) times daily.    . clobetasol ointment (TEMOVATE) 9.62 % Apply 1 application topically 2 (two) times daily as needed (psoriasis).    . folic acid (FOLVITE) 952 MCG tablet Take 800 mcg by mouth daily.     . hydrochlorothiazide (HYDRODIURIL) 25 MG tablet TAKE 1 TABLET DAILY 30 tablet 2  . insulin aspart protamine - aspart (NOVOLOG MIX 70/30 FLEXPEN) (70-30) 100 UNIT/ML FlexPen Inject 60 Units into the skin 2 (two) times daily.    Marland Kitchen lisinopril (PRINIVIL,ZESTRIL) 5 MG tablet Take 5 mg by mouth daily.    . metFORMIN (GLUCOPHAGE) 1000 MG tablet Take 1,000 mg by mouth 2 (two) times daily with a meal.    . naproxen sodium (ALEVE) 220  MG tablet Take 220 mg by mouth daily as needed (pain).    Marland Kitchen sertraline (ZOLOFT) 100 MG tablet TAKE 2 TABLETS DAILY (Patient taking differently: Take 100 mg by mouth once daily) 180 tablet 1   No current facility-administered medications for this visit.     Allergies  Allergen Reactions  . Iohexol Anaphylaxis  . Penicillins Rash    Has patient had a PCN reaction causing immediate rash, facial/tongue/throat swelling, SOB or lightheadedness with hypotension: Yes Has patient had a PCN reaction causing severe rash involving mucus membranes or skin necrosis: No Has patient had a PCN reaction that required hospitalization: No Has patient had a PCN reaction occurring within the last 10 years: No Can take amoxicillin     . Sulfa Antibiotics Rash    Social History   Socioeconomic History  . Marital status:  Divorced    Spouse name: Not on file  . Number of children: 2  . Years of education: HS grad  . Highest education level: Not on file  Social Needs  . Financial resource strain: Not on file  . Food insecurity - worry: Not on file  . Food insecurity - inability: Not on file  . Transportation needs - medical: Not on file  . Transportation needs - non-medical: Not on file  Occupational History  . Occupation: QUALITY MANAGER    Employer: [employer]  Tobacco Use  . Smoking status: Current Every Day Smoker    Packs/day: 1.00    Years: 45.00    Pack years: 45.00    Types: Cigarettes  . Smokeless tobacco: Never Used  Substance and Sexual Activity  . Alcohol use: No  . Drug use: No  . Sexual activity: Not Currently    Partners: Male    Birth control/protection: IUD  Other Topics Concern  . Not on file  Social History Narrative  . Not on file    Family History  Problem Relation Age of Onset  . Asthma Mother   . Hypertension Mother   . Stroke Father   . Alcohol abuse Father   . Cancer Sister        brain stem tumor  . Suicidality Sister   . Cancer Sister        breast  . Suicidality Brother   . Dementia Sister     Review of Systems:  As stated in the HPI and otherwise negative.   BP 140/68   Pulse (!) 110   Ht 5\' 7"  (1.702 m)   Wt 263 lb 6.4 oz (119.5 kg)   SpO2 93%   BMI 41.25 kg/m   Physical Examination: General: Well developed, well nourished, NAD  HEENT: OP clear, mucus membranes moist  SKIN: warm, dry. No rashes. Neuro: No focal deficits  Musculoskeletal: Muscle strength 5/5 all ext  Psychiatric: Mood and affect normal  Neck: No JVD, no carotid bruits, no thyromegaly, no lymphadenopathy.  Lungs:Clear bilaterally, no wheezes, rhonci, crackles Cardiovascular: Regular rhythm, tachycardia. Soft systolic murmur noted.  Abdomen:Soft. Bowel sounds present. Non-tender.  Extremities: No lower extremity edema. Pedal pulses are not palpable.   EKG:  EKG is  ordered today. The ekg ordered today demonstrates sinus tach, rate 110 bpm. Q wave lead 3.   Recent Labs: 02/28/2017: BUN 9; Creatinine, Ser 0.50; Hemoglobin 16.3; Potassium 4.0; Sodium 136   Lipid Panel    Component Value Date/Time   CHOL 155 01/28/2013 1110   TRIG 79 01/28/2013 1110   HDL 48 01/28/2013 1110   CHOLHDL  3.2 01/28/2013 1110   VLDL 16 01/28/2013 1110   LDLCALC 91 01/28/2013 1110     Wt Readings from Last 3 Encounters:  03/03/17 263 lb 6.4 oz (119.5 kg)  02/28/17 264 lb (119.7 kg)  02/20/17 263 lb (119.3 kg)     Other studies Reviewed: Additional studies/ records that were reviewed today include: . Review of the above records demonstrates:    Assessment and Plan:   1. Pre-operative cardiac risk assessment: She has risk factors for CAD including tobacco abuse, age, obesity, DM, HTN and PAD. She fortunately has no chest pain. She has a high probability of CAD. Will arrange a [facility]ard nuclear stress test to risk stratify prior to her planned vascular surgery.   2. Systolic murmur: Will arrange echo to assess.   3. Tobacco abuse: Smoking cessation is advised.   4. PAD: Per Dr. Trula Slade. Upcoming lower ext bypass following cardiac risk assessment.   Current medicines are reviewed at length with the patient today.  The patient does not have concerns regarding medicines.  The following changes have been made:  no change  Labs/ tests ordered today include:   Orders Placed This Encounter  Procedures  . Myocardial Perfusion Imaging  . ECHOCARDIOGRAM COMPLETE   We will call with her test results and arrange follow up then.    Disposition:   FU with me in 4 months   Signed, Lauree Chandler, MD 03/03/2017 3:07 PM    [facility] [address], [place], [place]  [address] Phone: [phone]; Fax: [phone]

## 2017-03-03 NOTE — Patient Instructions (Signed)
Medication Instructions:  Your physician recommends that you continue on your current medications as directed. Please refer to the Current Medication list given to you today.   Labwork: none  Testing/Procedures: Your physician has requested that you have an echocardiogram. Echocardiography is a painless test that uses sound waves to create images of your heart. It provides your doctor with information about the size and shape of your heart and how well your heart's chambers and valves are working. This procedure takes approximately one hour. There are no restrictions for this procedure.  Your physician has requested that you have a lexiscan myoview. For further information please visit [email or web address]. Please follow instruction sheet, as given.    Follow-Up: Your physician recommends that you schedule a follow-up appointment in: 4 months. Please call our office in mid January to schedule this appointment    Any Other Special Instructions Will Be Listed Below (If Applicable).     If you need a refill on your cardiac medications before your next appointment, please call your pharmacy.

## 2017-03-06 ENCOUNTER — Telehealth (HOSPITAL_COMMUNITY): Payer: Self-pay | Admitting: *Deleted

## 2017-03-06 NOTE — Addendum Note (Signed)
Addended by: Mendel Ryder on: 03/06/2017 12:34 PM   Modules accepted: Orders

## 2017-03-06 NOTE — Telephone Encounter (Signed)
Patient given detailed instructions per Myocardial Perfusion Study Information Sheet for the test on 03/08/17. Patient notified to arrive 15 minutes early and that it is imperative to arrive on time for appointment to keep from having the test rescheduled.  If you need to cancel or reschedule your appointment, please call the office within 24 hours of your appointment. . Patient verbalized understanding. [name] [name]

## 2017-03-08 ENCOUNTER — Ambulatory Visit (HOSPITAL_BASED_OUTPATIENT_CLINIC_OR_DEPARTMENT_OTHER): Payer: BLUE CROSS/BLUE SHIELD

## 2017-03-08 ENCOUNTER — Other Ambulatory Visit: Payer: Self-pay

## 2017-03-08 ENCOUNTER — Ambulatory Visit (HOSPITAL_COMMUNITY)
Admission: RE | Admit: 2017-03-08 | Discharge: 2017-03-08 | Disposition: A | Discharge status: Home / Self Care | Payer: BLUE CROSS/BLUE SHIELD | Source: Ambulatory Visit | Attending: Vascular Surgery | Admitting: Vascular Surgery

## 2017-03-08 DIAGNOSIS — R011 Cardiac murmur, unspecified: Secondary | ICD-10-CM | POA: Insufficient documentation

## 2017-03-08 DIAGNOSIS — Z01810 Encounter for preprocedural cardiovascular examination: Secondary | ICD-10-CM

## 2017-03-08 DIAGNOSIS — I6522 Occlusion and stenosis of left carotid artery: Secondary | ICD-10-CM | POA: Diagnosis not present

## 2017-03-08 DIAGNOSIS — I350 Nonrheumatic aortic (valve) stenosis: Secondary | ICD-10-CM | POA: Insufficient documentation

## 2017-03-08 DIAGNOSIS — Z01812 Encounter for preprocedural laboratory examination: Secondary | ICD-10-CM | POA: Diagnosis not present

## 2017-03-08 DIAGNOSIS — I739 Peripheral vascular disease, unspecified: Secondary | ICD-10-CM

## 2017-03-08 LAB — ECHOCARDIOGRAM COMPLETE
Height: 67 in
Weight: 4208 oz

## 2017-03-08 LAB — VAS US CAROTID
LCCAPDIAS: 10 cm/s
LEFT ECA DIAS: -9 cm/s
LICADDIAS: -15 cm/s
LICAPSYS: -236 cm/s
Left CCA prox sys: 138 cm/s
Left ICA dist sys: -42 cm/s
Left ICA prox dias: -54 cm/s
RCCAPDIAS: 26 cm/s
RCCAPSYS: 154 cm/s
RIGHT CCA MID DIAS: -30 cm/s
RIGHT ECA DIAS: -36 cm/s
Right cca dist sys: -96 cm/s

## 2017-03-08 MED ORDER — TECHNETIUM TC 99M TETROFOSMIN IV KIT
32.4500 | PACK | Freq: Once | INTRAVENOUS | Status: AC | PRN
  Administered 2017-03-08: 32.45 via INTRAVENOUS
  Filled 2017-03-08: qty 33

## 2017-03-08 MED ORDER — REGADENOSON 0.4 MG/5ML IV SOLN
0.4000 mg | Freq: Once | INTRAVENOUS | Status: AC
  Administered 2017-03-08: 0.4 mg via INTRAVENOUS

## 2017-03-09 ENCOUNTER — Ambulatory Visit (HOSPITAL_COMMUNITY): Payer: BLUE CROSS/BLUE SHIELD | Attending: Cardiovascular Disease

## 2017-03-09 LAB — MYOCARDIAL PERFUSION IMAGING
CHL CUP RESTING HR STRESS: 93 {beats}/min
LVDIAVOL: 102 mL (ref 46–106)
LVSYSVOL: 43 mL
Peak HR: 107 {beats}/min
RATE: 0.23
SDS: 4
SRS: 5
SSS: 9
TID: 0.94

## 2017-03-09 MED ORDER — TECHNETIUM TC 99M TETROFOSMIN IV KIT
32.4000 | PACK | Freq: Once | INTRAVENOUS | Status: AC | PRN
  Administered 2017-03-09: 32.4 via INTRAVENOUS
  Filled 2017-03-09: qty 33

## 2017-03-10 ENCOUNTER — Ambulatory Visit (HOSPITAL_COMMUNITY): Payer: BLUE CROSS/BLUE SHIELD

## 2017-03-13 ENCOUNTER — Ambulatory Visit (INDEPENDENT_AMBULATORY_CARE_PROVIDER_SITE_OTHER): Payer: BLUE CROSS/BLUE SHIELD | Admitting: Surgery

## 2017-03-13 ENCOUNTER — Ambulatory Visit (HOSPITAL_COMMUNITY)
Admission: RE | Admit: 2017-03-13 | Discharge: 2017-03-13 | Disposition: A | Discharge status: Home / Self Care | Payer: BLUE CROSS/BLUE SHIELD | Source: Ambulatory Visit | Attending: Surgery | Admitting: Surgery

## 2017-03-13 ENCOUNTER — Encounter: Payer: Self-pay | Admitting: Surgery

## 2017-03-13 VITALS — BP 140/82 | HR 95 | Temp 97.0°F | Resp 18 | Ht 67.0 in | Wt 263.0 lb

## 2017-03-13 DIAGNOSIS — I6522 Occlusion and stenosis of left carotid artery: Secondary | ICD-10-CM | POA: Insufficient documentation

## 2017-03-13 DIAGNOSIS — Z01810 Encounter for preprocedural cardiovascular examination: Secondary | ICD-10-CM | POA: Diagnosis not present

## 2017-03-13 DIAGNOSIS — I6523 Occlusion and stenosis of bilateral carotid arteries: Secondary | ICD-10-CM | POA: Diagnosis not present

## 2017-03-13 DIAGNOSIS — Z01812 Encounter for preprocedural laboratory examination: Secondary | ICD-10-CM

## 2017-03-13 DIAGNOSIS — I70213 Atherosclerosis of native arteries of extremities with intermittent claudication, bilateral legs: Secondary | ICD-10-CM | POA: Diagnosis not present

## 2017-03-13 NOTE — Progress Notes (Signed)
[facility] [place]  Patient name: [name] [name] MRN: [id number] DOB: Nov 25, 1955 Sex: female   REASON FOR VISIT:    Follow up  [place] ILLNESS:    [name] [name] is a 61 y.o. female who is here for follow up.  She has been experiencing this for several months but is getting worse.  She states that she has trouble walking more than 200 feet.  Her right leg bothers her more than the left.  Her symptoms are improved with rest.  She is s/p angiogram and is here to discuss the results and surgical options.  The patient suffers from plaque psoriasis.  She is waiting on medication.  She has active disease in both legs.  She suffers from diabetes.  This is not been well controlled recently as her A1c was 8.2.  She is medically managed for hypercholesterolemia with a statin.  She is a current smoker.   PAST MEDICAL HISTORY:   Past Medical History:  Diagnosis Date  . Abdominal abscess   . Allergic rhinitis   . Asthma   . Carotid stenosis, left 04/2009   50-69%  . Colon polyps 12/05  . COPD (chronic obstructive pulmonary disease) ([facility])   . Depression   . Diabetes mellitus    followed by Dr. Chalmers Cater  . Folate deficiency   . Hyperlipidemia   . Hypertension   . Lumbar disc disease    h/o hnp with repair  . Obesity, morbid (more than 100 lbs over ideal weight or BMI > 40) (HCC)   . Thrombocytopenia ([place])    work up with Dr. Griffith Citron neg in 2000  . Tobacco abuse      FAMILY HISTORY:   Family History  Problem Relation Age of Onset  . Asthma Mother   . Hypertension Mother   . Stroke Father   . Alcohol abuse Father   . Cancer Sister        brain stem tumor  . Suicidality Sister   . Cancer Sister        breast  . Suicidality Brother   . Dementia Sister     SOCIAL HISTORY:   Social History   Tobacco Use  . Smoking status: Current Every Day Smoker    Packs/day: 1.00    Years: 45.00    Pack years:  45.00    Types: Cigarettes  . Smokeless tobacco: Never Used  Substance Use Topics  . Alcohol use: No     ALLERGIES:   Allergies  Allergen Reactions  . Iohexol Anaphylaxis  . Penicillins Rash    Has patient had a PCN reaction causing immediate rash, facial/tongue/throat swelling, SOB or lightheadedness with hypotension: Yes Has patient had a PCN reaction causing severe rash involving mucus membranes or skin necrosis: No Has patient had a PCN reaction that required hospitalization: No Has patient had a PCN reaction occurring within the last 10 years: No Can take amoxicillin     . Sulfa Antibiotics Rash     CURRENT MEDICATIONS:   Current Outpatient Medications  Medication Sig Dispense Refill  . albuterol (PROVENTIL HFA;VENTOLIN HFA) 108 (90 BASE) MCG/ACT inhaler Inhale 2 puffs into the lungs every 6 (six) hours as needed for wheezing.    Marland Kitchen aspirin 81 MG EC tablet Take 81 mg by mouth daily.      Marland Kitchen atorvastatin (LIPITOR) 20 MG tablet TAKE 1 TABLET DAILY 90 tablet 0  . Cholecalciferol (VITAMIN D3) 1000 UNITS CAPS Take 1,000 Units  by mouth 2 (two) times daily.     Marland Kitchen CINNAMON PO Take 1,000 mg by mouth 2 (two) times daily.    . clobetasol ointment (TEMOVATE) 8.10 % Apply 1 application topically 2 (two) times daily as needed (psoriasis).    . folic acid (FOLVITE) 175 MCG tablet Take 800 mcg by mouth daily.     . hydrochlorothiazide (HYDRODIURIL) 25 MG tablet TAKE 1 TABLET DAILY 30 tablet 2  . insulin aspart protamine - aspart (NOVOLOG MIX 70/30 FLEXPEN) (70-30) 100 UNIT/ML FlexPen Inject 60 Units into the skin 2 (two) times daily.    Marland Kitchen lisinopril (PRINIVIL,ZESTRIL) 5 MG tablet Take 5 mg by mouth daily.    . metFORMIN (GLUCOPHAGE) 1000 MG tablet Take 1,000 mg by mouth 2 (two) times daily with a meal.    . naproxen sodium (ALEVE) 220 MG tablet Take 220 mg by mouth daily as needed (pain).    Marland Kitchen sertraline (ZOLOFT) 100 MG tablet TAKE 2 TABLETS DAILY (Patient taking differently: Take 100 mg  by mouth once daily) 180 tablet 1   No current facility-administered medications for this visit.     REVIEW OF SYSTEMS:   [X]  denotes positive finding, [ ]  denotes negative finding Cardiac  Comments:  Chest pain or chest pressure:    Shortness of breath upon exertion:    Short of breath when lying flat:    Irregular heart rhythm:        Vascular    Pain in calf, thigh, or hip brought on by ambulation:    Pain in feet at night that wakes you up from your sleep:  x   Blood clot in your veins:    Leg swelling:         Pulmonary    Oxygen at home:    Productive cough:     Wheezing:         Neurologic    Sudden weakness in arms or legs:     Sudden numbness in arms or legs:     Sudden onset of difficulty speaking or slurred speech:    Temporary loss of vision in one eye:     Problems with dizziness:         Gastrointestinal    Blood in stool:     Vomited blood:         Genitourinary    Burning when urinating:     Blood in urine:        Psychiatric    Major depression:         Hematologic    Bleeding problems:    Problems with blood clotting too easily:        Skin    Rashes or ulcers:        Constitutional    Fever or chills:      PHYSICAL EXAM:   There were no vitals filed for this visit.  GENERAL: The patient is a well-nourished female, in no acute distress. The vital signs are documented above. CARDIAC: There is a regular rate and rhythm.  PULMONARY: Non-labored respirations ABDOMEN: Soft and non-tender with normal pitched bowel sounds.  MUSCULOSKELETAL: There are no major deformities or cyanosis. NEUROLOGIC: No focal weakness or paresthesias are detected. SKIN: There are no ulcers or rashes noted. PSYCHIATRIC: The patient has a normal affect.  STUDIES:   ANGIO:   #1 no significant aortoiliac occlusive disease.                #2 high-grade calcific stenotic  lesions in the right common femoral artery with superficial femoral artery occlusion.  There  is reconstitution of the popliteal artery with three-vessel runoff to the ankle.  Consideration for right femoral endarterectomy with right femoral to below-knee popliteal artery bypass.                #3 occluded left superficial femoral artery with reconstitution of the above-knee popliteal artery.  Consideration for a left femoral to above versus below-knee popliteal artery.  Carotid duplex: 80-99% right-sided stenosis.  Possible left carotid occlusion  Vein mapping: Appropriate sized saphenous vein  MEDICAL ISSUES:   Carotid stenosis: The patient is asymptomatic.  Her duplex today showed a greater than 80% right-sided stenosis and possible left-sided occlusion.  I told the patient she needs to have a CT scan to definitively confirm the left side is occluded.  This will also help me determine the true stenosis of the right.  She will potentially require carotid endarterectomy prior to leg bypass.  Atherosclerotic lower extremity vascular disease: The patient continues to have significant pain in her right leg.  Studies indicate that she would be a candidate for a right femoral endarterectomy with right femoral to below-knee popliteal artery bypass graft with vein.  I will contact the patient regarding the results of her CT angiogram of the neck, and make definitive surgical plans at that time.    [name], MD [facility] [phone] Pager [phone]

## 2017-03-15 ENCOUNTER — Encounter (HOSPITAL_COMMUNITY): Payer: BLUE CROSS/BLUE SHIELD

## 2017-03-15 ENCOUNTER — Encounter: Payer: BLUE CROSS/BLUE SHIELD | Admitting: Surgery

## 2017-03-15 NOTE — Addendum Note (Signed)
Addended by: Lianne Cure A on: 03/15/2017 09:01 AM   Modules accepted: Orders

## 2017-03-16 ENCOUNTER — Encounter: Payer: Self-pay | Admitting: Cardiovascular Disease

## 2017-03-16 ENCOUNTER — Telehealth: Payer: Self-pay | Admitting: Nurse Practitioner

## 2017-03-16 NOTE — Telephone Encounter (Signed)
13hr prep called into [facility] on [address]. 50mg  Prednisone to be given on 12/30 at 8pm. 12/31 at 2am,. 12/31 at 8am. Last dose to be taken with Benadryl 50mg . Pt called and made aware of dosages and times.

## 2017-03-20 ENCOUNTER — Ambulatory Visit
Admission: RE | Admit: 2017-03-20 | Discharge: 2017-03-20 | Disposition: A | Discharge status: Home / Self Care | Payer: BLUE CROSS/BLUE SHIELD | Source: Ambulatory Visit | Attending: Surgery | Admitting: Surgery

## 2017-03-20 DIAGNOSIS — I6523 Occlusion and stenosis of bilateral carotid arteries: Secondary | ICD-10-CM

## 2017-03-20 MED ORDER — IOPAMIDOL (ISOVUE-370) INJECTION 76%
75.0000 mL | Freq: Once | INTRAVENOUS | Status: AC | PRN
  Administered 2017-03-20: 75 mL via INTRAVENOUS

## 2017-03-21 DIAGNOSIS — R7881 Bacteremia: Secondary | ICD-10-CM

## 2017-03-21 HISTORY — DX: Bacteremia: R78.81

## 2017-03-29 ENCOUNTER — Emergency Department (HOSPITAL_COMMUNITY): Payer: BLUE CROSS/BLUE SHIELD

## 2017-03-29 ENCOUNTER — Encounter (HOSPITAL_COMMUNITY): Payer: Self-pay | Admitting: Emergency Medicine

## 2017-03-29 DIAGNOSIS — R110 Nausea: Secondary | ICD-10-CM | POA: Diagnosis not present

## 2017-03-29 DIAGNOSIS — F17210 Nicotine dependence, cigarettes, uncomplicated: Secondary | ICD-10-CM | POA: Diagnosis not present

## 2017-03-29 DIAGNOSIS — J449 Chronic obstructive pulmonary disease, unspecified: Secondary | ICD-10-CM | POA: Diagnosis not present

## 2017-03-29 DIAGNOSIS — Z880 Allergy status to penicillin: Secondary | ICD-10-CM | POA: Diagnosis not present

## 2017-03-29 DIAGNOSIS — Z882 Allergy status to sulfonamides status: Secondary | ICD-10-CM

## 2017-03-29 DIAGNOSIS — I10 Essential (primary) hypertension: Secondary | ICD-10-CM | POA: Diagnosis present

## 2017-03-29 DIAGNOSIS — E1152 Type 2 diabetes mellitus with diabetic peripheral angiopathy with gangrene: Secondary | ICD-10-CM | POA: Diagnosis not present

## 2017-03-29 DIAGNOSIS — I70222 Atherosclerosis of native arteries of extremities with rest pain, left leg: Secondary | ICD-10-CM | POA: Diagnosis present

## 2017-03-29 DIAGNOSIS — I6523 Occlusion and stenosis of bilateral carotid arteries: Secondary | ICD-10-CM | POA: Diagnosis present

## 2017-03-29 DIAGNOSIS — E785 Hyperlipidemia, unspecified: Secondary | ICD-10-CM | POA: Diagnosis not present

## 2017-03-29 DIAGNOSIS — D696 Thrombocytopenia, unspecified: Secondary | ICD-10-CM | POA: Diagnosis not present

## 2017-03-29 DIAGNOSIS — Z888 Allergy status to other drugs, medicaments and biological substances status: Secondary | ICD-10-CM | POA: Diagnosis not present

## 2017-03-29 DIAGNOSIS — Z7982 Long term (current) use of aspirin: Secondary | ICD-10-CM | POA: Diagnosis not present

## 2017-03-29 DIAGNOSIS — E1151 Type 2 diabetes mellitus with diabetic peripheral angiopathy without gangrene: Secondary | ICD-10-CM | POA: Diagnosis not present

## 2017-03-29 DIAGNOSIS — Z720 Tobacco use: Secondary | ICD-10-CM | POA: Diagnosis not present

## 2017-03-29 DIAGNOSIS — M79671 Pain in right foot: Secondary | ICD-10-CM | POA: Diagnosis not present

## 2017-03-29 DIAGNOSIS — I739 Peripheral vascular disease, unspecified: Secondary | ICD-10-CM | POA: Diagnosis not present

## 2017-03-29 DIAGNOSIS — L03039 Cellulitis of unspecified toe: Secondary | ICD-10-CM | POA: Diagnosis not present

## 2017-03-29 DIAGNOSIS — Z79899 Other long term (current) drug therapy: Secondary | ICD-10-CM

## 2017-03-29 DIAGNOSIS — L400 Psoriasis vulgaris: Secondary | ICD-10-CM | POA: Diagnosis present

## 2017-03-29 DIAGNOSIS — Z6841 Body Mass Index (BMI) 40.0 and over, adult: Secondary | ICD-10-CM | POA: Diagnosis not present

## 2017-03-29 DIAGNOSIS — I70261 Atherosclerosis of native arteries of extremities with gangrene, right leg: Secondary | ICD-10-CM | POA: Diagnosis present

## 2017-03-29 DIAGNOSIS — F329 Major depressive disorder, single episode, unspecified: Secondary | ICD-10-CM | POA: Diagnosis present

## 2017-03-29 DIAGNOSIS — L97519 Non-pressure chronic ulcer of other part of right foot with unspecified severity: Secondary | ICD-10-CM | POA: Diagnosis present

## 2017-03-29 DIAGNOSIS — Z794 Long term (current) use of insulin: Secondary | ICD-10-CM | POA: Diagnosis not present

## 2017-03-29 DIAGNOSIS — J439 Emphysema, unspecified: Secondary | ICD-10-CM | POA: Diagnosis not present

## 2017-03-29 LAB — CBC WITH DIFFERENTIAL/PLATELET
BASOS ABS: 0 10*3/uL (ref 0.0–0.1)
Basophils Relative: 0 %
EOS ABS: 0.3 10*3/uL (ref 0.0–0.7)
EOS PCT: 3 %
HCT: 46 % (ref 36.0–46.0)
HEMOGLOBIN: 15.2 g/dL — AB (ref 12.0–15.0)
LYMPHS ABS: 2.6 10*3/uL (ref 0.7–4.0)
LYMPHS PCT: 26 %
MCH: 27.3 pg (ref 26.0–34.0)
MCHC: 33 g/dL (ref 30.0–36.0)
MCV: 82.7 fL (ref 78.0–100.0)
Monocytes Absolute: 0.6 10*3/uL (ref 0.1–1.0)
Monocytes Relative: 6 %
Neutro Abs: 6.3 10*3/uL (ref 1.7–7.7)
Neutrophils Relative %: 65 %
PLATELETS: 119 10*3/uL — AB (ref 150–400)
RBC: 5.56 MIL/uL — AB (ref 3.87–5.11)
RDW: 15.3 % (ref 11.5–15.5)
WBC: 9.8 10*3/uL (ref 4.0–10.5)

## 2017-03-29 LAB — BASIC METABOLIC PANEL
ANION GAP: 11 (ref 5–15)
BUN: 6 mg/dL (ref 6–20)
CO2: 26 mmol/L (ref 22–32)
Calcium: 9.4 mg/dL (ref 8.9–10.3)
Chloride: 95 mmol/L — ABNORMAL LOW (ref 101–111)
Creatinine, Ser: 0.49 mg/dL (ref 0.44–1.00)
GFR calc Af Amer: >60 mL/min (ref >60)
Glucose, Bld: 152 mg/dL — ABNORMAL HIGH (ref 65–99)
POTASSIUM: 4 mmol/L (ref 3.5–5.1)
SODIUM: 132 mmol/L — AB (ref 135–145)

## 2017-03-29 MED ORDER — OXYCODONE-ACETAMINOPHEN 5-325 MG PO TABS
1.0000 | ORAL_TABLET | Freq: Once | ORAL | Status: AC
  Administered 2017-03-29: 1 via ORAL
  Filled 2017-03-29: qty 1

## 2017-03-29 NOTE — ED Triage Notes (Signed)
Patient reports worsening right 2nd toe infection with redness/swelling and necrotic tip onset 2 months ago , denies fever or chills.

## 2017-03-30 ENCOUNTER — Inpatient Hospital Stay (HOSPITAL_COMMUNITY)
Admission: EM | Admit: 2017-03-30 | Discharge: 2017-03-30 | DRG: 300 | Disposition: A | Discharge status: Home / Self Care | Payer: BLUE CROSS/BLUE SHIELD | Attending: Internal Medicine | Admitting: Internal Medicine

## 2017-03-30 DIAGNOSIS — F329 Major depressive disorder, single episode, unspecified: Secondary | ICD-10-CM

## 2017-03-30 DIAGNOSIS — M79671 Pain in right foot: Secondary | ICD-10-CM | POA: Diagnosis not present

## 2017-03-30 DIAGNOSIS — Z720 Tobacco use: Secondary | ICD-10-CM | POA: Diagnosis present

## 2017-03-30 DIAGNOSIS — I739 Peripheral vascular disease, unspecified: Secondary | ICD-10-CM | POA: Diagnosis present

## 2017-03-30 DIAGNOSIS — I6523 Occlusion and stenosis of bilateral carotid arteries: Secondary | ICD-10-CM | POA: Diagnosis not present

## 2017-03-30 DIAGNOSIS — D696 Thrombocytopenia, unspecified: Secondary | ICD-10-CM | POA: Diagnosis present

## 2017-03-30 DIAGNOSIS — J449 Chronic obstructive pulmonary disease, unspecified: Secondary | ICD-10-CM | POA: Diagnosis present

## 2017-03-30 DIAGNOSIS — Z7982 Long term (current) use of aspirin: Secondary | ICD-10-CM | POA: Diagnosis not present

## 2017-03-30 DIAGNOSIS — E785 Hyperlipidemia, unspecified: Secondary | ICD-10-CM | POA: Diagnosis present

## 2017-03-30 DIAGNOSIS — J439 Emphysema, unspecified: Secondary | ICD-10-CM | POA: Diagnosis present

## 2017-03-30 DIAGNOSIS — L400 Psoriasis vulgaris: Secondary | ICD-10-CM | POA: Diagnosis present

## 2017-03-30 DIAGNOSIS — E1152 Type 2 diabetes mellitus with diabetic peripheral angiopathy with gangrene: Secondary | ICD-10-CM | POA: Diagnosis present

## 2017-03-30 DIAGNOSIS — Z882 Allergy status to sulfonamides status: Secondary | ICD-10-CM | POA: Diagnosis not present

## 2017-03-30 DIAGNOSIS — E1151 Type 2 diabetes mellitus with diabetic peripheral angiopathy without gangrene: Secondary | ICD-10-CM | POA: Diagnosis present

## 2017-03-30 DIAGNOSIS — Z880 Allergy status to penicillin: Secondary | ICD-10-CM | POA: Diagnosis not present

## 2017-03-30 DIAGNOSIS — I70222 Atherosclerosis of native arteries of extremities with rest pain, left leg: Secondary | ICD-10-CM | POA: Diagnosis present

## 2017-03-30 DIAGNOSIS — F32A Depression, unspecified: Secondary | ICD-10-CM | POA: Diagnosis present

## 2017-03-30 DIAGNOSIS — Z794 Long term (current) use of insulin: Secondary | ICD-10-CM | POA: Diagnosis not present

## 2017-03-30 DIAGNOSIS — I70261 Atherosclerosis of native arteries of extremities with gangrene, right leg: Secondary | ICD-10-CM | POA: Diagnosis not present

## 2017-03-30 DIAGNOSIS — Z6841 Body Mass Index (BMI) 40.0 and over, adult: Secondary | ICD-10-CM | POA: Diagnosis not present

## 2017-03-30 DIAGNOSIS — I10 Essential (primary) hypertension: Secondary | ICD-10-CM | POA: Diagnosis present

## 2017-03-30 DIAGNOSIS — Z888 Allergy status to other drugs, medicaments and biological substances status: Secondary | ICD-10-CM | POA: Diagnosis not present

## 2017-03-30 DIAGNOSIS — R110 Nausea: Secondary | ICD-10-CM | POA: Diagnosis present

## 2017-03-30 DIAGNOSIS — Z79899 Other long term (current) drug therapy: Secondary | ICD-10-CM | POA: Diagnosis not present

## 2017-03-30 DIAGNOSIS — F17210 Nicotine dependence, cigarettes, uncomplicated: Secondary | ICD-10-CM | POA: Diagnosis present

## 2017-03-30 DIAGNOSIS — L97519 Non-pressure chronic ulcer of other part of right foot with unspecified severity: Secondary | ICD-10-CM | POA: Diagnosis present

## 2017-03-30 LAB — HIV ANTIBODY (ROUTINE TESTING W REFLEX): HIV SCREEN 4TH GENERATION: NONREACTIVE

## 2017-03-30 LAB — LACTIC ACID, PLASMA
Lactic Acid, Venous: 1.2 mmol/L (ref 0.5–1.9)
Lactic Acid, Venous: 0.8 mmol/L (ref 0.5–1.9)

## 2017-03-30 LAB — C-REACTIVE PROTEIN

## 2017-03-30 LAB — PROTIME-INR
INR: 1.06
PROTHROMBIN TIME: 13.7 s (ref 11.4–15.2)

## 2017-03-30 LAB — PROCALCITONIN: Procalcitonin: <0.1 ng/mL

## 2017-03-30 LAB — SEDIMENTATION RATE: SED RATE: 28 mm/h — AB (ref 0–22)

## 2017-03-30 LAB — CBG MONITORING, ED: GLUCOSE-CAPILLARY: 195 mg/dL — AB (ref 65–99)

## 2017-03-30 LAB — APTT: aPTT: 35 seconds (ref 24–36)

## 2017-03-30 MED ORDER — CLOBETASOL PROPIONATE 0.05 % EX OINT: 1.0000 "application " | TOPICAL_OINTMENT | Freq: Two times a day (BID) | CUTANEOUS | Status: DC | PRN

## 2017-03-30 MED ORDER — ATORVASTATIN CALCIUM 10 MG PO TABS
20.0000 mg | ORAL_TABLET | Freq: Every day | ORAL | Status: DC
  Filled 2017-03-30: qty 1

## 2017-03-30 MED ORDER — MORPHINE SULFATE (PF) 4 MG/ML IV SOLN: 2.0000 mg | INTRAVENOUS | Status: DC | PRN

## 2017-03-30 MED ORDER — VANCOMYCIN HCL 10 G IV SOLR
2000.0000 mg | Freq: Once | INTRAVENOUS | Status: AC
  Administered 2017-03-30: 2000 mg via INTRAVENOUS
  Filled 2017-03-30: qty 2000

## 2017-03-30 MED ORDER — HYDROCODONE-ACETAMINOPHEN 5-325 MG PO TABS: 1.0000 | ORAL_TABLET | Freq: Four times a day (QID) | ORAL | 0 refills | Status: DC | PRN

## 2017-03-30 MED ORDER — HYDROCODONE-ACETAMINOPHEN 5-325 MG PO TABS: 1.0000 | ORAL_TABLET | Freq: Four times a day (QID) | ORAL | Status: DC | PRN

## 2017-03-30 MED ORDER — VITAMIN D 1000 UNITS PO TABS
1000.0000 [IU] | ORAL_TABLET | Freq: Two times a day (BID) | ORAL | Status: DC
  Administered 2017-03-30: 1000 [IU] via ORAL
  Filled 2017-03-30: qty 1

## 2017-03-30 MED ORDER — INSULIN ASPART PROT & ASPART (70-30 MIX) 100 UNIT/ML ~~LOC~~ SUSP
30.0000 [IU] | Freq: Two times a day (BID) | SUBCUTANEOUS | Status: DC
  Filled 2017-03-30: qty 10

## 2017-03-30 MED ORDER — SERTRALINE HCL 100 MG PO TABS
200.0000 mg | ORAL_TABLET | Freq: Every day | ORAL | Status: DC
  Filled 2017-03-30: qty 2

## 2017-03-30 MED ORDER — NICOTINE 21 MG/24HR TD PT24
21.0000 mg | MEDICATED_PATCH | Freq: Once | TRANSDERMAL | Status: DC
  Administered 2017-03-30: 21 mg via TRANSDERMAL
  Filled 2017-03-30: qty 1

## 2017-03-30 MED ORDER — ALBUTEROL SULFATE (2.5 MG/3ML) 0.083% IN NEBU: 2.5000 mg | INHALATION_SOLUTION | RESPIRATORY_TRACT | Status: DC | PRN

## 2017-03-30 MED ORDER — METRONIDAZOLE IN NACL 5-0.79 MG/ML-% IV SOLN: 500.0000 mg | Freq: Once | INTRAVENOUS | Status: DC

## 2017-03-30 MED ORDER — ACETAMINOPHEN 325 MG PO TABS: 650.0000 mg | ORAL_TABLET | Freq: Four times a day (QID) | ORAL | Status: DC | PRN

## 2017-03-30 MED ORDER — SODIUM CHLORIDE 0.9 % IV SOLN
INTRAVENOUS | Status: DC
  Administered 2017-03-30: 06:00:00 via INTRAVENOUS

## 2017-03-30 MED ORDER — FOLIC ACID 1 MG PO TABS
1.0000 mg | ORAL_TABLET | Freq: Every day | ORAL | Status: DC
  Administered 2017-03-30: 1 mg via ORAL
  Filled 2017-03-30: qty 1

## 2017-03-30 MED ORDER — INSULIN ASPART 100 UNIT/ML ~~LOC~~ SOLN
0.0000 [IU] | Freq: Three times a day (TID) | SUBCUTANEOUS | Status: DC
  Administered 2017-03-30: 2 [IU] via SUBCUTANEOUS
  Filled 2017-03-30: qty 1

## 2017-03-30 MED ORDER — HYDROCODONE-ACETAMINOPHEN 5-325 MG PO TABS
2.0000 | ORAL_TABLET | Freq: Once | ORAL | Status: AC
Start: 2017-03-30 — End: 2017-03-30
  Administered 2017-03-30: 2 via ORAL
  Filled 2017-03-30: qty 2

## 2017-03-30 MED ORDER — ONDANSETRON HCL 4 MG/2ML IJ SOLN: 4.0000 mg | Freq: Three times a day (TID) | INTRAMUSCULAR | Status: DC | PRN

## 2017-03-30 MED ORDER — POLYETHYLENE GLYCOL 3350 17 G PO PACK: 17.0000 g | PACK | Freq: Every day | ORAL | Status: DC | PRN

## 2017-03-30 MED ORDER — LISINOPRIL 2.5 MG PO TABS
5.0000 mg | ORAL_TABLET | Freq: Every day | ORAL | Status: DC
  Administered 2017-03-30: 5 mg via ORAL
  Filled 2017-03-30 (×2): qty 1

## 2017-03-30 MED ORDER — NICOTINE 21 MG/24HR TD PT24: 21.0000 mg | MEDICATED_PATCH | Freq: Once | TRANSDERMAL | 0 refills | Status: AC

## 2017-03-30 MED ORDER — LORAZEPAM 2 MG/ML IJ SOLN
1.0000 mg | Freq: Once | INTRAMUSCULAR | Status: AC
  Administered 2017-03-30: 1 mg via INTRAVENOUS
  Filled 2017-03-30: qty 1

## 2017-03-30 MED ORDER — CEFTRIAXONE SODIUM 1 G IJ SOLR: 1.0000 g | Freq: Once | INTRAMUSCULAR | Status: DC

## 2017-03-30 MED ORDER — ZOLPIDEM TARTRATE 5 MG PO TABS: 5.0000 mg | ORAL_TABLET | Freq: Every evening | ORAL | Status: DC | PRN

## 2017-03-30 MED ORDER — VANCOMYCIN HCL 10 G IV SOLR
1250.0000 mg | Freq: Two times a day (BID) | INTRAVENOUS | Status: DC
  Filled 2017-03-30: qty 1250

## 2017-03-30 MED ORDER — HYDRALAZINE HCL 20 MG/ML IJ SOLN: 5.0000 mg | INTRAMUSCULAR | Status: DC | PRN

## 2017-03-30 MED ORDER — NAPROXEN 250 MG PO TABS: 250.0000 mg | ORAL_TABLET | Freq: Every day | ORAL | Status: DC | PRN

## 2017-03-30 MED ORDER — ASPIRIN EC 81 MG PO TBEC
81.0000 mg | DELAYED_RELEASE_TABLET | Freq: Every day | ORAL | Status: DC
  Administered 2017-03-30: 81 mg via ORAL
  Filled 2017-03-30: qty 1

## 2017-03-30 NOTE — Discharge Summary (Signed)
Physician Discharge Summary  [name] [name]:[id number] DOB: 03-01-1956 DOA: 03/30/2017  PCP: Jacelyn Pi, MD  Admit date: 03/30/2017 Discharge date: 03/30/2017  Time spent: 45 minutes  Recommendations for Outpatient Follow-up:  Patient will be discharged to home.  Patient will need to follow up with primary care provider within one week of discharge. Follow up with vascular surgery, Dr. Trula Slade. Patient should continue medications as prescribed.  Patient should follow a heart healthy/carb modified diet.   Discharge Diagnoses:  Right foot pain in the setting of PVD Depression Diabetes mellitus, type II Hyperlipidemia Essential hypertension Thrombocytopenia Tobacco abuse COPD  Discharge Condition: stable  Diet recommendation: heart healthy/carb modified  Filed Weights   03/29/17 2117  Weight: 119.3 kg (263 lb)    History of present illness:  On 03/30/2017 by Dr. Ivor Costa [name] [name] is a 62 y.o. female with medical history significant of PVD, hypertension, hyperlipidemia, diabetes mellitus, COPD, depression, tobacco abuse, thrombocytopenia, who presents with right foot pain.  Patient states that she has been having bilateral lower extremity pain due to PVD for several months. VVS, Dr. Trula Slade is doing work up and planning bypass surgery for her. Patient states that in the past 3 weeks, she has worsening right foot pain, much worse today. The pain is constant, severe, sharp, nonradiating. It is not aggravated or alleviated by any known factors. Her left second toe become black. The right foot is mildly red. She does not have fever or chills. Denies chest pain, shortness breath. She has chronic mild cough due to COPD, which has not changed.  [name] nausea, vomiting, diarrhea or abdominal pain. [name] symptoms of UTI or unilateral weakness.  Hospital Course:  Right foot pain in the setting of PVD -Patient has a gangrenous second right toe with mild erythema -Currently  afebrile, [name] leukocytosis -Vascular surgery consulted and appreciated. Discussed with Dr. Trula Slade, would like to operate on patient's carotid arteries first, which will not occur until next week. Recommended patient be discharged to home with 25 tablets of percocet. Feels her foot looks [name] different. -Continue pain control  Depression -Continue Zoloft  Diabetes mellitus, type II -Hemoglobin A1c was 8 -Continue metformin, 70/30 insulin  Hyperlipidemia -Continue statin  Essential hypertension -Continue Cipro, HCTZ  Thrombocytopenia -Appears to be chronic, reviewed Epic, patient's platelets have ranged from 70 in 2009-150 -Currently stable  Tobacco abuse -Discussed smoking cessation, continue nicotine patch  COPD -Stable, continue home medications  Procedures: None  Consultations: Vascular surgery  Discharge Exam: Vitals:   03/30/17 0957 03/30/17 1115  BP: (!) 144/72   Pulse: (!) 111 (!) 110  Resp: 20 (!) 21  Temp:    SpO2: 93% 92%     General: Well developed, well nourished, NAD, appears stated age  HEENT: NCAT, mucous membranes moist.  Cardiovascular: S1 S2 auscultated, [name] rubs, murmurs or gallops. Regular rate and rhythm.  Respiratory: Clear to auscultation bilaterally with equal chest rise  Abdomen: Soft, obese, nontender, nondistended, + bowel sounds  Extremities: warm dry without cyanosis clubbing or edema  Neuro: AAOx3, nonfocal  Skin: necrotic Right 2nd toe tip, mild erythema. Psoriatic type rash on left anterior chin and lateral malleolus  Psych: Normal affect and demeanor with intact judgement and insight  Discharge Instructions Discharge Instructions    Discharge instructions   Complete by:  As directed    Patient will be discharged to home.  Patient will need to follow up with primary care provider within one week of discharge. Follow up with vascular  surgery, Dr. Trula Slade. Patient should continue medications as prescribed.  Patient should  follow a heart healthy/carb modified diet.     Allergies as of 03/30/2017      Reactions   Iohexol Rash   Pt states she had a rash 35-40 yrs ago during a procedure.  Benadryl was given and pt was fine in 30 mins. She has had multiple CT's since with premeds and has done fine.  [name] anaphylaxis per pt. I updated this record. [name], RTRCT  03/06/17   Penicillins Rash   Has patient had a PCN reaction causing immediate rash, facial/tongue/throat swelling, SOB or lightheadedness with hypotension: Yes Has patient had a PCN reaction causing severe rash involving mucus membranes or skin necrosis: [name] Has patient had a PCN reaction that required hospitalization: [name] Has patient had a PCN reaction occurring within the last 10 years: [name] Can take amoxicillin    Sulfa Antibiotics Rash      Medication List    TAKE these medications   albuterol 108 (90 Base) MCG/ACT inhaler Commonly known as:  PROVENTIL HFA;VENTOLIN HFA Inhale 2 puffs into the lungs every 6 (six) hours as needed for wheezing.   aspirin 81 MG EC tablet Take 81 mg by mouth daily.   atorvastatin 20 MG tablet Commonly known as:  LIPITOR TAKE 1 TABLET DAILY   CINNAMON PO Take 1,000 mg by mouth 2 (two) times daily.   folic acid 485 MCG tablet Commonly known as:  FOLVITE Take 800 mcg by mouth daily.   hydrochlorothiazide 25 MG tablet Commonly known as:  HYDRODIURIL TAKE 1 TABLET DAILY   HYDROcodone-acetaminophen 5-325 MG tablet Commonly known as:  NORCO/VICODIN Take 1 tablet by mouth every 6 (six) hours as needed for moderate pain.   lisinopril 5 MG tablet Commonly known as:  PRINIVIL,ZESTRIL Take 5 mg by mouth daily.   metFORMIN 1000 MG tablet Commonly known as:  GLUCOPHAGE Take 1,000 mg by mouth 2 (two) times daily with a meal.   nicotine 21 mg/24hr patch Commonly known as:  NICODERM CQ - dosed in mg/24 hours Place 1 patch (21 mg total) onto the skin once for 1 dose.   NOVOLOG MIX 70/30 FLEXPEN (70-30) 100  UNIT/ML FlexPen Generic drug:  insulin aspart protamine - aspart Inject 60 Units into the skin 2 (two) times daily.   sertraline 100 MG tablet Commonly known as:  ZOLOFT TAKE 2 TABLETS DAILY What changed:    how much to take  how to take this  when to take this   Vitamin D3 1000 units Caps Take 1,000 Units by mouth 2 (two) times daily.      Allergies  Allergen Reactions  . Iohexol Rash    Pt states she had a rash 35-40 yrs ago during a procedure.  Benadryl was given and pt was fine in 30 mins. She has had multiple CT's since with premeds and has done fine.  [name] anaphylaxis per pt. I updated this record. [name], RTRCT  03/06/17  . Penicillins Rash    Has patient had a PCN reaction causing immediate rash, facial/tongue/throat swelling, SOB or lightheadedness with hypotension: Yes Has patient had a PCN reaction causing severe rash involving mucus membranes or skin necrosis: [name] Has patient had a PCN reaction that required hospitalization: [name] Has patient had a PCN reaction occurring within the last 10 years: [name] Can take amoxicillin     . Sulfa Antibiotics Rash   Follow-up Information    Jacelyn Pi, MD. Schedule an  appointment as soon as possible for a visit in 1 week(s).   Specialty:  Endocrinology Why:  Hospital follow up Contact information: [address] [place] [address] [phone]        Serafina Mitchell, MD. Schedule an appointment as soon as possible for a visit in 1 week(s).   Specialties:  Vascular Surgery, Cardiology Why:  hospital follow up Contact information: [place] [address] [phone]            The results of significant diagnostics from this hospitalization (including imaging, microbiology, ancillary and laboratory) are listed below for reference.    Significant Diagnostic Studies: Ct Angio Head W Or Wo Contrast  Result Date: 03/20/2017 CLINICAL DATA:  62 year old female with evidence of  left carotid short segment occlusion or near occlusion, and high-grade right ICA stenosis on carotid Doppler ultrasound 03/08/2017. Subsequent encounter. EXAM: CT ANGIOGRAPHY HEAD AND NECK TECHNIQUE: Multidetector CT imaging of the head and neck was performed using the standard protocol during bolus administration of intravenous contrast. Multiplanar CT image reconstructions and MIPs were obtained to evaluate the vascular anatomy. Carotid stenosis measurements (when applicable) are obtained utilizing NASCET criteria, using the distal internal carotid diameter as the denominator. CONTRAST:  41mL ISOVUE-370 IOPAMIDOL (ISOVUE-370) INJECTION 76% COMPARISON:  Report of carotid Doppler ultrasound 03/08/2017. Head CT and cervical spine CT without contrast 06/05/2012. FINDINGS: CT HEAD Brain: Cerebral volume is stable since 2014 and normal for age. Mild for age scattered cerebral white matter hypodensity. [name] midline shift, ventriculomegaly, mass effect, evidence of mass lesion, intracranial hemorrhage or evidence of cortically based acute infarction. [name] cortical encephalomalacia identified. Calvarium and skull base: Negative; mild motion artifact at the skullbase. Paranasal sinuses: Clear. Orbits: Visualized orbits and scalp soft tissues are within normal limits. CTA NECK Skeleton: Absent dentition aside from impacted left maxillary wisdom tooth. [name] acute osseous abnormality identified. Upper chest: Centrilobular emphysema suspected. Otherwise negative visible lung parenchyma. [name] superior mediastinal lymphadenopathy. Other neck: Subcentimeter hypodense right anterior thyroid lobe nodule on series 5, image 84 does not meet consensus criteria for ultrasound follow-up. The glottis is closed and there is intermittent motion artifact in the pharynx. [name] neck mass or lymphadenopathy identified. Aortic arch: Calcified aortic atherosclerosis. Four vessel arch configuration, the left vertebral artery appears to arise directly from  the arch (series 5, image 108). Right carotid system: [name] brachiocephalic artery or right CCA origin stenosis despite calcified plaque. The right CCA is then negative until the right carotid bifurcation where bulky calcified plaque occurs affecting both the right ICA and ECA origins. High-grade right ICA origin stenosis results, numerically estimated at 75 % with respect to the distal vessel , and might be higher owing to the complex configuration of what appears to be both calcified and soft plaque on series 13, image 175. The cervical right ICA remains patent and has a normal caliber distally. Left carotid system: [name] left CCA origin stenosis despite calcified plaque. There is some circumferential soft plaque in the left CCA proximal to the bifurcation without stenosis. Bulky calcified plaque at the left carotid bifurcation continues into the left ICA origin and bulb with what appears essentially as a radiographic string sign stenosis over a 13-14 mm segment of the vessel (series 12, image 138. The downstream left ICA caliber is diminished to about 3 mm diameter, but [name] bona fide occlusion of the vessel is identified. Vertebral arteries: [name] proximal right subclavian artery stenosis despite calcified plaque. Calcified plaque at the right vertebral  artery origin with moderate to severe stenosis (series 11, image 157). The right vertebral artery is dominant. There is intermittent right V 2 segment calcified plaque with only mild associated stenosis. Similar mild right V3 segment calcified plaque without stenosis. The left vertebral artery is non dominant, diminutive, and arises directly from the arch. The left vertebral artery is only 1-2 mm diameter throughout its course but does remain patent into the posterior fossa. CTA HEAD Posterior circulation: Dominant distal right vertebral artery with mild V4 segment calcified plaque not resulting in stenosis. The right vertebral primarily supplies the basilar artery. Normal  right PICA origin. Non dominant and diminutive left V4 segment is patent but thread-like. The left AICA appears dominant. [name] basilar artery stenosis. SCA origins are patent. There are fetal type bilateral PCA origins, the left P1 is larger. Bilateral PCA branches are within normal limits. There is an incidental prominent left basal vein of Rosenthal communicating with a large left tentorial vein which appears to be a normal variant (series 16, image 55). [name] AVM is identified. Anterior circulation: Both ICA siphons are patent. On the right there is mild to moderate siphon calcified plaque with mild stenosis. Right ophthalmic and posterior communicating artery origins are normal. On the left there is moderate to severe calcified plaque with moderate stenosis of both the left cavernous and supraclinoid ICA segments. The left ophthalmic and posterior communicating artery origins are normal. Patent carotid termini. Normal MCA and right ACA origins. There is mild to moderate irregularity and stenosis at the left ACA origin. The A1 segments are fairly codominant. Anterior communicating artery and bilateral ACA branches are within normal limits. Left MCA M1 segment, bifurcation, and left MCA branches are within normal limits. Right MCA M1 segment, trifurcation, and right MCA branches are within normal limits. Venous sinuses: Patent. Prominent asymmetric left tentorial vein which appears to communicate with the left basal vein of Rosenthal stated above appears to be a normal variant. Dominant left transverse and sigmoid sinuses is a normal variant. Anatomic variants: Dominant right and diminutive left vertebral arteries. The left vertebral artery arises directly from the arch. Dominant left transverse and sigmoid sinuses as well as a prominent asymmetric left tentorial vein. Delayed phase: [name] abnormal enhancement identified. Review of the MIP images confirms the above findings IMPRESSION: 1. Radiographic-String-Sign Stenosis  of the proximal Left ICA along a segment of about 14 mm due to bulky calcified plaque. Associated decreased caliber of the Left ICA. Superimposed downstream moderate to severe multifocal Left ICA siphon stenosis due to calcified plaque. 2. High-grade proximal Right ICA stenosis also due to bulky calcified plaque is numerically estimated at at least 75%, and appears to approach are Radiographic-String-Sign on some images. Mild to moderate Right ICA siphon stenosis due to calcified plaque. 3. Dominant Right Vertebral Artery with moderate to severe stenosis at its origin due to calcified plaque. Additional right vertebral calcified plaque without stenosis. 4. Non dominant and diminutive Left vertebral artery which arises directly from the arch. 5. Mild to moderate stenosis at the Left ACA origin appears to be atherosclerotic. [name] other intracranial arterial stenosis. 6. Asymmetric left tentorial vein associated with the left basal vein of Rosenthal appears to be a normal anatomic variant. 7. [name] acute intracranial abnormality. Mild for age cerebral white matter disease. 8. Aortic Atherosclerosis (ICD10-I70.0) and Emphysema (ICD10-J43.9). Electronically Signed   By: Genevie Ann M.D.   On: 03/20/2017 10:14   Ct Angio Neck W Or Wo Contrast  Result Date: 03/20/2017 CLINICAL DATA:  62 year old female with evidence of left carotid short segment occlusion or near occlusion, and high-grade right ICA stenosis on carotid Doppler ultrasound 03/08/2017. Subsequent encounter. EXAM: CT ANGIOGRAPHY HEAD AND NECK TECHNIQUE: Multidetector CT imaging of the head and neck was performed using the standard protocol during bolus administration of intravenous contrast. Multiplanar CT image reconstructions and MIPs were obtained to evaluate the vascular anatomy. Carotid stenosis measurements (when applicable) are obtained utilizing NASCET criteria, using the distal internal carotid diameter as the denominator. CONTRAST:  48mL ISOVUE-370  IOPAMIDOL (ISOVUE-370) INJECTION 76% COMPARISON:  Report of carotid Doppler ultrasound 03/08/2017. Head CT and cervical spine CT without contrast 06/05/2012. FINDINGS: CT HEAD Brain: Cerebral volume is stable since 2014 and normal for age. Mild for age scattered cerebral white matter hypodensity. [name] midline shift, ventriculomegaly, mass effect, evidence of mass lesion, intracranial hemorrhage or evidence of cortically based acute infarction. [name] cortical encephalomalacia identified. Calvarium and skull base: Negative; mild motion artifact at the skullbase. Paranasal sinuses: Clear. Orbits: Visualized orbits and scalp soft tissues are within normal limits. CTA NECK Skeleton: Absent dentition aside from impacted left maxillary wisdom tooth. [name] acute osseous abnormality identified. Upper chest: Centrilobular emphysema suspected. Otherwise negative visible lung parenchyma. [name] superior mediastinal lymphadenopathy. Other neck: Subcentimeter hypodense right anterior thyroid lobe nodule on series 5, image 84 does not meet consensus criteria for ultrasound follow-up. The glottis is closed and there is intermittent motion artifact in the pharynx. [name] neck mass or lymphadenopathy identified. Aortic arch: Calcified aortic atherosclerosis. Four vessel arch configuration, the left vertebral artery appears to arise directly from the arch (series 5, image 108). Right carotid system: [name] brachiocephalic artery or right CCA origin stenosis despite calcified plaque. The right CCA is then negative until the right carotid bifurcation where bulky calcified plaque occurs affecting both the right ICA and ECA origins. High-grade right ICA origin stenosis results, numerically estimated at 75 % with respect to the distal vessel , and might be higher owing to the complex configuration of what appears to be both calcified and soft plaque on series 13, image 175. The cervical right ICA remains patent and has a normal caliber distally. Left carotid  system: [name] left CCA origin stenosis despite calcified plaque. There is some circumferential soft plaque in the left CCA proximal to the bifurcation without stenosis. Bulky calcified plaque at the left carotid bifurcation continues into the left ICA origin and bulb with what appears essentially as a radiographic string sign stenosis over a 13-14 mm segment of the vessel (series 12, image 138. The downstream left ICA caliber is diminished to about 3 mm diameter, but [name] bona fide occlusion of the vessel is identified. Vertebral arteries: [name] proximal right subclavian artery stenosis despite calcified plaque. Calcified plaque at the right vertebral artery origin with moderate to severe stenosis (series 11, image 157). The right vertebral artery is dominant. There is intermittent right V 2 segment calcified plaque with only mild associated stenosis. Similar mild right V3 segment calcified plaque without stenosis. The left vertebral artery is non dominant, diminutive, and arises directly from the arch. The left vertebral artery is only 1-2 mm diameter throughout its course but does remain patent into the posterior fossa. CTA HEAD Posterior circulation: Dominant distal right vertebral artery with mild V4 segment calcified plaque not resulting in stenosis. The right vertebral primarily supplies the basilar artery. Normal right PICA origin. Non dominant and diminutive left V4 segment is patent but thread-like. The left AICA appears dominant. [name] basilar artery stenosis. SCA origins  are patent. There are fetal type bilateral PCA origins, the left P1 is larger. Bilateral PCA branches are within normal limits. There is an incidental prominent left basal vein of Rosenthal communicating with a large left tentorial vein which appears to be a normal variant (series 16, image 55). [name] AVM is identified. Anterior circulation: Both ICA siphons are patent. On the right there is mild to moderate siphon calcified plaque with mild stenosis.  Right ophthalmic and posterior communicating artery origins are normal. On the left there is moderate to severe calcified plaque with moderate stenosis of both the left cavernous and supraclinoid ICA segments. The left ophthalmic and posterior communicating artery origins are normal. Patent carotid termini. Normal MCA and right ACA origins. There is mild to moderate irregularity and stenosis at the left ACA origin. The A1 segments are fairly codominant. Anterior communicating artery and bilateral ACA branches are within normal limits. Left MCA M1 segment, bifurcation, and left MCA branches are within normal limits. Right MCA M1 segment, trifurcation, and right MCA branches are within normal limits. Venous sinuses: Patent. Prominent asymmetric left tentorial vein which appears to communicate with the left basal vein of Rosenthal stated above appears to be a normal variant. Dominant left transverse and sigmoid sinuses is a normal variant. Anatomic variants: Dominant right and diminutive left vertebral arteries. The left vertebral artery arises directly from the arch. Dominant left transverse and sigmoid sinuses as well as a prominent asymmetric left tentorial vein. Delayed phase: [name] abnormal enhancement identified. Review of the MIP images confirms the above findings IMPRESSION: 1. Radiographic-String-Sign Stenosis of the proximal Left ICA along a segment of about 14 mm due to bulky calcified plaque. Associated decreased caliber of the Left ICA. Superimposed downstream moderate to severe multifocal Left ICA siphon stenosis due to calcified plaque. 2. High-grade proximal Right ICA stenosis also due to bulky calcified plaque is numerically estimated at at least 75%, and appears to approach are Radiographic-String-Sign on some images. Mild to moderate Right ICA siphon stenosis due to calcified plaque. 3. Dominant Right Vertebral Artery with moderate to severe stenosis at its origin due to calcified plaque. Additional  right vertebral calcified plaque without stenosis. 4. Non dominant and diminutive Left vertebral artery which arises directly from the arch. 5. Mild to moderate stenosis at the Left ACA origin appears to be atherosclerotic. [name] other intracranial arterial stenosis. 6. Asymmetric left tentorial vein associated with the left basal vein of Rosenthal appears to be a normal anatomic variant. 7. [name] acute intracranial abnormality. Mild for age cerebral white matter disease. 8. Aortic Atherosclerosis (ICD10-I70.0) and Emphysema (ICD10-J43.9). Electronically Signed   By: Genevie Ann M.D.   On: 03/20/2017 10:14   Dg Toe 2nd Right  Result Date: 03/29/2017 CLINICAL DATA:  Swelling and infection of the right second toe. EXAM: RIGHT SECOND TOE COMPARISON:  None. FINDINGS: There is [name] evidence of fracture or dislocation. There is [name] evidence of osteolytic changes. Diffuse soft tissue swelling of the second digit. IMPRESSION: [name] radiographic evidence of osteomyelitis. Diffuse soft tissue swelling of the second digit without soft tissue emphysema. Electronically Signed   By: Fidela Salisbury M.D.   On: 03/29/2017 22:08    Microbiology: [name] results found for this or any previous visit (from the past 240 hour(s)).   Labs: Basic Metabolic Panel: Recent Labs  Lab 03/29/17 2122  NA 132*  K 4.0  CL 95*  CO2 26  GLUCOSE 152*  BUN 6  CREATININE 0.49  CALCIUM 9.4   Liver Function  Tests: [name] results for input(s): AST, ALT, ALKPHOS, BILITOT, PROT, ALBUMIN in the last 168 hours. [name] results for input(s): LIPASE, AMYLASE in the last 168 hours. [name] results for input(s): AMMONIA in the last 168 hours. CBC: Recent Labs  Lab 03/29/17 2122  WBC 9.8  NEUTROABS 6.3  HGB 15.2*  HCT 46.0  MCV 82.7  PLT 119*   Cardiac Enzymes: [name] results for input(s): CKTOTAL, CKMB, CKMBINDEX, TROPONINI in the last 168 hours. BNP: BNP (last 3 results) [name] results for input(s): BNP in the last 8760 hours.  ProBNP (last 3 results) [name]  results for input(s): PROBNP in the last 8760 hours.  CBG: Recent Labs  Lab 03/30/17 0755  GLUCAP 195*       Signed:  Daruis Swaim  [facility] 03/30/2017, 12:59 PM

## 2017-03-30 NOTE — Consult Note (Signed)
Hospital Consult    Reason for Consult:  Pain and tissue changes R foot Requesting Physician:  Dr. Randal Buba MRN #:  [id number]  History of Present Illness: This is a 62 y.o. female [name] [name] is a 62 year old female seen in consultation for an increase in pain of her right foot with concurrent dry gangrene of her second toe and the presence of peripheral arterial disease and insulin-dependent diabetes mellitus.  X-ray of right foot performed in emergency department was negative for signs of osteomyelitis.  She has been worked up by Dr. Trula Slade as an outpatient towards femoral-popliteal bypass surgery.  She is also noted to have critical stenosis of bilateral ICA and will likely require carotid revascularization before bilateral lower extremity revascularization.  She is status post aortogram with bilateral lower extremity runoff by Dr. Trula Slade on 02/28/17 which demonstrated high-grade calcific stenotic lesions in the right common femoral artery as well as SFA occlusion with reconstitution in the popliteal artery and three-vessel runoff to the ankle in addition to an occluded left SFA.  During exam this morning she states that her second toe began to change color and darkened over the past week or so and pain has been worsening.  Pain this morning in her right foot has resolved with the use of pain medication.  Patient has been admitted under the hospitalist service for IV antibiotics for suspected cellulitis of right foot as well as for pain control.  She is taking an aspirin daily.  She is a current every day smoker.  She denies strokelike symptoms including slurring speech, changes in vision, or one-sided weakness.  She has no history of CVA or TIA.  Recent stress testing demonstrates fixed apical infarct.  Past Medical History:  Diagnosis Date  . Abdominal abscess   . Allergic rhinitis   . Asthma   . Carotid stenosis, left 04/2009   50-69%  . Colon polyps 12/05  . COPD (chronic obstructive  pulmonary disease) ([place])   . Depression   . Diabetes mellitus    followed by Dr. Chalmers Cater  . Folate deficiency   . Hyperlipidemia   . Hypertension   . Lumbar disc disease    h/o hnp with repair  . Obesity, morbid (more than 100 lbs over ideal weight or BMI > 40) (H[facility])   . Thrombocytopenia ([place])    work up with Dr. Griffith Citron neg in 2000  . Tobacco abuse     Past Surgical History:  Procedure Laterality Date  . ABDOMINAL AORTOGRAM W/LOWER EXTREMITY N/A 02/28/2017   Procedure: ABDOMINAL AORTOGRAM W/LOWER EXTREMITY;  Surgeon: Serafina Mitchell, MD;  Location: [facility];  Service: Cardiovascular;  Laterality: N/A;  Bilateral  . BACK SURGERY  1989   LS  for HNP  . CHOLECYSTECTOMY  1982  . HERNIA REPAIR  2001   incisional  . INCISE AND DRAIN ABCESS  01/11/11   abd abscess    Allergies  Allergen Reactions  . Iohexol Rash    Pt states she had a rash 35-40 yrs ago during a procedure.  Benadryl was given and pt was fine in 30 mins. She has had multiple CT's since with premeds and has done fine.  No anaphylaxis per pt. I updated this record. [name], RTRCT  03/06/17  . Penicillins Rash    Has patient had a PCN reaction causing immediate rash, facial/tongue/throat swelling, SOB or lightheadedness with hypotension: Yes Has patient had a PCN reaction causing severe rash involving mucus membranes or skin necrosis: No  Has patient had a PCN reaction that required hospitalization: No Has patient had a PCN reaction occurring within the last 10 years: No Can take amoxicillin     . Sulfa Antibiotics Rash    Prior to Admission medications   Medication Sig Start Date End Date Taking? Authorizing Provider  albuterol (PROVENTIL HFA;VENTOLIN HFA) 108 (90 BASE) MCG/ACT inhaler Inhale 2 puffs into the lungs every 6 (six) hours as needed for wheezing.   Yes [provider]  aspirin 81 MG EC tablet Take 81 mg by mouth daily.     Yes [provider]  atorvastatin (LIPITOR) 20  MG tablet TAKE 1 TABLET DAILY 03/13/13  Yes Schoenhoff, Altamese Cabal, MD  Cholecalciferol (VITAMIN D3) 1000 UNITS CAPS Take 1,000 Units by mouth 2 (two) times daily.    Yes [provider]  CINNAMON PO Take 1,000 mg by mouth 2 (two) times daily.   Yes [provider]  folic acid (FOLVITE) 017 MCG tablet Take 800 mcg by mouth daily.    Yes [provider]  hydrochlorothiazide (HYDRODIURIL) 25 MG tablet TAKE 1 TABLET DAILY   Yes Schoenhoff, Altamese Cabal, MD  insulin aspart protamine - aspart (NOVOLOG MIX 70/30 FLEXPEN) (70-30) 100 UNIT/ML FlexPen Inject 60 Units into the skin 2 (two) times daily.   Yes [provider]  lisinopril (PRINIVIL,ZESTRIL) 5 MG tablet Take 5 mg by mouth daily.   Yes [provider]  metFORMIN (GLUCOPHAGE) 1000 MG tablet Take 1,000 mg by mouth 2 (two) times daily with a meal.   Yes [provider]  sertraline (ZOLOFT) 100 MG tablet TAKE 2 TABLETS DAILY Patient taking differently: Take 100 mg by mouth once daily 01/01/14  Yes Schoenhoff, Altamese Cabal, MD    Social History   Socioeconomic History  . Marital status: Divorced    Spouse name: Not on file  . Number of children: 2  . Years of education: HS grad  . Highest education level: Not on file  Social Needs  . Financial resource strain: Not on file  . Food insecurity - worry: Not on file  . Food insecurity - inability: Not on file  . Transportation needs - medical: Not on file  . Transportation needs - non-medical: Not on file  Occupational History  . Occupation: QUALITY MANAGER    Employer: [employer]  Tobacco Use  . Smoking status: Current Every Day Smoker    Packs/day: 1.00    Years: 45.00    Pack years: 45.00    Types: Cigarettes  . Smokeless tobacco: Never Used  Substance and Sexual Activity  . Alcohol use: No  . Drug use: No  . Sexual activity: Not Currently    Partners: Male    Birth control/protection: IUD  Other Topics Concern  . Not on file    Social History Narrative  . Not on file     Family History  Problem Relation Age of Onset  . Asthma Mother   . Hypertension Mother   . Stroke Father   . Alcohol abuse Father   . Cancer Sister        brain stem tumor  . Suicidality Sister   . Cancer Sister        breast  . Suicidality Brother   . Dementia Sister     ROS: Otherwise negative unless mentioned in HPI  Physical Examination  Vitals:   03/30/17 0723 03/30/17 0836  BP:  (!) 167/91  Pulse:    Resp:  Temp: 98 F (36.7 C)   SpO2:     Body mass index is 41.19 kg/m.  General:  WDWN in NAD Gait: Not observed HENT: WNL, normocephalic Pulmonary: normal non-labored breathing, without Rales, rhonchi,  wheezing Cardiac: regular, without  Murmurs, rubs or gallops Abdomen: soft, NT/ND, no masses Skin: with rashes consistent with diagnosis of psoriasis Vascular Exam/Pulses: Symmetrical femoral pulses; right PT by Doppler at the level of the ankle Extremities: with ischemic changes, with Gangrene right second toe tip, with mild erythema; various psoriatic plaque especially left anterior shin and lateral malleolus; active range of motion preserved right foot Musculoskeletal: no muscle wasting or atrophy  Neurologic: A&O X 3;  No focal weakness or paresthesias are detected; speech is fluent/normal Psychiatric:  The pt has Normal affect. Lymph:  Unremarkable  CBC    Component Value Date/Time   WBC 9.8 03/29/2017 2122   RBC 5.56 (H) 03/29/2017 2122   HGB 15.2 (H) 03/29/2017 2122   HCT 46.0 03/29/2017 2122   PLT 119 (L) 03/29/2017 2122   MCV 82.7 03/29/2017 2122   MCH 27.3 03/29/2017 2122   MCHC 33.0 03/29/2017 2122   RDW 15.3 03/29/2017 2122   LYMPHSABS 2.6 03/29/2017 2122   MONOABS 0.6 03/29/2017 2122   EOSABS 0.3 03/29/2017 2122   BASOSABS 0.0 03/29/2017 2122    BMET    Component Value Date/Time   NA 132 (L) 03/29/2017 2122   K 4.0 03/29/2017 2122   CL 95 (L) 03/29/2017 2122   CO2 26 03/29/2017  2122   GLUCOSE 152 (H) 03/29/2017 2122   BUN 6 03/29/2017 2122   CREATININE 0.49 03/29/2017 2122   CREATININE 0.69 01/28/2013 1110   CALCIUM 9.4 03/29/2017 2122   GFRNONAA >60 03/29/2017 2122   GFRAA >60 03/29/2017 2122    COAGS: Lab Results  Component Value Date   INR 1.06 03/30/2017   02/28/2017 Pre-operative Diagnosis: Bilateral leg pain with ulcers Post-operative diagnosis:  Same Surgeon:  [name] Procedure Performed:               1.  Ultrasound-guided access, left femoral artery               2.  Abdominal aortogram               3.  Second-order catheterization               4.  Bilateral lower extremity runoff               5.  Conscious sedation (> 15 minutes)                  Indications: Duplex showed severely diminished ABIs.  She has nonhealing ulcers from plaque psoriasis.  She is here today for angiography the patient is experiencing bilateral lower extremity pain, bordering on rest pain.  Procedure:  The patient was identified in the holding area and taken to room 8.  The patient was then placed supine on the table and prepped and draped in the usual sterile fashion.  A time out was called.  Conscious sedation was administered with the use of IV fentanyl and Versed under continuous physician and nurse monitoring.  Heart rate, blood pressure, and oxygen saturations were continuously monitored.  Ultrasound was used to evaluate the left common femoral artery.  It was patent .  A digital ultrasound image was acquired.  A micropuncture needle was used to access the left common femoral artery under ultrasound guidance.  An 018 wire was advanced without resistance and a micropuncture sheath was placed.  The 018 wire was removed and a benson wire was placed.  The micropuncture sheath was exchanged for a 5 french sheath.  An omniflush catheter was advanced over the wire to the level of L-1.  An abdominal angiogram was obtained.  Next, using the omniflush catheter and a  benson wire, the aortic bifurcation was crossed and the catheter was placed into theright external iliac artery and right runoff was obtained.  left runoff was performed via retrograde sheath injections.  Findings:                Aortogram:   No significant renal artery stenosis.  The infrarenal abdominal aorta is non-aneurysmal and widely patent.  Bilateral common iliac arteries are patent throughout their course.               Right Lower Extremity: Calcific plaque with greater than 80% stenosis noted in the right common femoral artery.  The profundofemoral artery is patent throughout its course.  The superficial femoral artery is occluded at its origin with reconstitution of the above-knee popliteal artery which is disease.  The below-knee popliteal artery is patent without significant stenosis.  There is three-vessel runoff.  The anterior tibial artery does appear to occlude down near the ankle.               Left Lower Extremity: The left common femoral and profunda femoral artery are widely patent.  The superficial femoral artery is occluded with reconstitution at the level of the adductor canal however this is diseased down to the level of the patella.  The below-knee popliteal artery is patent throughout its course with three-vessel runoff  Intervention: None  Impression:               #1 no significant aortoiliac occlusive disease.               #2 high-grade calcific stenotic lesions in the right common femoral artery with superficial femoral artery occlusion.  There is reconstitution of the popliteal artery with three-vessel runoff to the ankle.  Consideration for right femoral endarterectomy with right femoral to below-knee popliteal artery bypass.               #3 occluded left superficial femoral artery with reconstitution of the above-knee popliteal artery.  Consideration for a left femoral to above versus below-knee popliteal artery.                  [name] [name],  M.D. [facility] Office: [phone] Pager:  [phone]  Non-Invasive Vascular Imaging:   Result Notes for Myocardial Perfusion Imaging   Notes recorded by Michaelyn Barter, RN on 03/16/2017 at 11:10 AM EST Patient called back for test results. Per Dr. Angelena Form, No ischemia noted on stress test. Can we let her know that I will send clearance to Dr. Trula Slade so she can proceed with her planned surgery? Patient verbalized understanding. ------  Notes recorded by Michaelyn Barter, RN on 03/16/2017 at 8:48 AM EST Left message for patient to call back. ------  Notes recorded by Burnell Blanks, MD on 03/16/2017 at 6:51 AM EST No ischemia noted on stress test. Can we let her know that I will send clearance to Dr. Trula Slade so she can proceed with her planned surgery? Thanks, chris      Vitals   Height Weight BMI (Calculated)  5\' 7"  (1.702  m) 263 lb (119.3 kg) 41.18  Study Highlights    Nuclear stress EF: 58%.  There was no ST segment deviation noted during stress.  Defect 1: There is a small, fixed defect of moderate severity present in the apical inferior and apical lateral location. This could be consistent with prior infarct. However, given normal wall motion, favor artifact.  The left ventricular ejection fraction is normal (55-65%).  This is a low risk study.   CT ANGIO NECK W OR WO CONTRAST (Accession [id number]) (Order [id number])  Imaging  Date: 03/20/2017 Department: [facility] Released By: Julieta Gutting Authorizing: Serafina Mitchell, MD  Exam Information   Status Exam Begun  Exam Ended   Final [99] 03/20/2017 9:10 AM 03/20/2017 9:37 AM  PACS Images   Show images for CT ANGIO NECK W OR WO CONTRAST  Study Result   CLINICAL DATA:  62 year old female with evidence of left carotid short segment occlusion or near occlusion, and high-grade right ICA stenosis on carotid Doppler  ultrasound 03/08/2017. Subsequent encounter.  EXAM: CT ANGIOGRAPHY HEAD AND NECK  TECHNIQUE: Multidetector CT imaging of the head and neck was performed using the standard protocol during bolus administration of intravenous contrast. Multiplanar CT image reconstructions and MIPs were obtained to evaluate the vascular anatomy. Carotid stenosis measurements (when applicable) are obtained utilizing NASCET criteria, using the distal internal carotid diameter as the denominator.  CONTRAST:  2mL ISOVUE-370 IOPAMIDOL (ISOVUE-370) INJECTION 76%  COMPARISON:  Report of carotid Doppler ultrasound 03/08/2017. Head CT and cervical spine CT without contrast 06/05/2012.  FINDINGS: CT HEAD  Brain: Cerebral volume is stable since 2014 and normal for age. Mild for age scattered cerebral white matter hypodensity. No midline shift, ventriculomegaly, mass effect, evidence of mass lesion, intracranial hemorrhage or evidence of cortically based acute infarction. No cortical encephalomalacia identified.  Calvarium and skull base: Negative; mild motion artifact at the skullbase.  Paranasal sinuses: Clear.  Orbits: Visualized orbits and scalp soft tissues are within normal limits.  CTA NECK  Skeleton: Absent dentition aside from impacted left maxillary wisdom tooth. No acute osseous abnormality identified.  Upper chest: Centrilobular emphysema suspected. Otherwise negative visible lung parenchyma. No superior mediastinal lymphadenopathy.  Other neck: Subcentimeter hypodense right anterior thyroid lobe nodule on series 5, image 84 does not meet consensus criteria for ultrasound follow-up. The glottis is closed and there is intermittent motion artifact in the pharynx. No neck mass or lymphadenopathy identified.  Aortic arch: Calcified aortic atherosclerosis. Four vessel arch configuration, the left vertebral artery appears to arise directly from the arch (series 5, image  108).  Right carotid system: No brachiocephalic artery or right CCA origin stenosis despite calcified plaque. The right CCA is then negative until the right carotid bifurcation where bulky calcified plaque occurs affecting both the right ICA and ECA origins. High-grade right ICA origin stenosis results, numerically estimated at 75 % with respect to the distal vessel , and might be higher owing to the complex configuration of what appears to be both calcified and soft plaque on series 13, image 175. The cervical right ICA remains patent and has a normal caliber distally.  Left carotid system: No left CCA origin stenosis despite calcified plaque. There is some circumferential soft plaque in the left CCA proximal to the bifurcation without stenosis. Bulky calcified plaque at the left carotid bifurcation continues into the left ICA origin and bulb with what appears essentially as a radiographic string sign stenosis over a 13-14 mm segment  of the vessel (series 12, image 138. The downstream left ICA caliber is diminished to about 3 mm diameter, but no bona fide occlusion of the vessel is identified.  Vertebral arteries: No proximal right subclavian artery stenosis despite calcified plaque. Calcified plaque at the right vertebral artery origin with moderate to severe stenosis (series 11, image 157). The right vertebral artery is dominant. There is intermittent right V 2 segment calcified plaque with only mild associated stenosis. Similar mild right V3 segment calcified plaque without stenosis.  The left vertebral artery is non dominant, diminutive, and arises directly from the arch. The left vertebral artery is only 1-2 mm diameter throughout its course but does remain patent into the posterior fossa.  CTA HEAD  Posterior circulation: Dominant distal right vertebral artery with mild V4 segment calcified plaque not resulting in stenosis. The right vertebral primarily supplies the  basilar artery. Normal right PICA origin. Non dominant and diminutive left V4 segment is patent but thread-like. The left AICA appears dominant.  No basilar artery stenosis. SCA origins are patent. There are fetal type bilateral PCA origins, the left P1 is larger. Bilateral PCA branches are within normal limits. There is an incidental prominent left basal vein of Rosenthal communicating with a large left tentorial vein which appears to be a normal variant (series 16, image 55). No AVM is identified.  Anterior circulation: Both ICA siphons are patent. On the right there is mild to moderate siphon calcified plaque with mild stenosis. Right ophthalmic and posterior communicating artery origins are normal. On the left there is moderate to severe calcified plaque with moderate stenosis of both the left cavernous and supraclinoid ICA segments. The left ophthalmic and posterior communicating artery origins are normal.  Patent carotid termini. Normal MCA and right ACA origins. There is mild to moderate irregularity and stenosis at the left ACA origin. The A1 segments are fairly codominant. Anterior communicating artery and bilateral ACA branches are within normal limits. Left MCA M1 segment, bifurcation, and left MCA branches are within normal limits. Right MCA M1 segment, trifurcation, and right MCA branches are within normal limits.  Venous sinuses: Patent. Prominent asymmetric left tentorial vein which appears to communicate with the left basal vein of Rosenthal stated above appears to be a normal variant. Dominant left transverse and sigmoid sinuses is a normal variant.  Anatomic variants: Dominant right and diminutive left vertebral arteries. The left vertebral artery arises directly from the arch. Dominant left transverse and sigmoid sinuses as well as a prominent asymmetric left tentorial vein.  Delayed phase: No abnormal enhancement identified.  Review of the MIP images  confirms the above findings  IMPRESSION: 1. Radiographic-String-Sign Stenosis of the proximal Left ICA along a segment of about 14 mm due to bulky calcified plaque. Associated decreased caliber of the Left ICA. Superimposed downstream moderate to severe multifocal Left ICA siphon stenosis due to calcified plaque. 2. High-grade proximal Right ICA stenosis also due to bulky calcified plaque is numerically estimated at at least 75%, and appears to approach are Radiographic-String-Sign on some images. Mild to moderate Right ICA siphon stenosis due to calcified plaque. 3. Dominant Right Vertebral Artery with moderate to severe stenosis at its origin due to calcified plaque. Additional right vertebral calcified plaque without stenosis. 4. Non dominant and diminutive Left vertebral artery which arises directly from the arch. 5. Mild to moderate stenosis at the Left ACA origin appears to be atherosclerotic. No other intracranial arterial stenosis. 6. Asymmetric left tentorial vein associated with the left basal  vein of Rosenthal appears to be a normal anatomic variant. 7. No acute intracranial abnormality. Mild for age cerebral white matter disease. 8. Aortic Atherosclerosis (ICD10-I70.0) and Emphysema (ICD10-J43.9).   Electronically Signed   By: Genevie Ann M.D.   On: 03/20/2017 10:14   DG Toe 2nd Right (Accession [id number]) (Order [id number])  Imaging  Date: 03/29/2017 Department: [facility] Released By: Durward Mallard, RN (auto-released) Authorizing: Veatrice Kells, MD  Exam Information   Status Exam Begun  Exam Ended   Final [99] 03/29/2017 9:53 PM 03/29/2017 10:00 PM  PACS Images   Show images for DG Toe 2nd Right  Study Result   CLINICAL DATA:  Swelling and infection of the right second toe.  EXAM: RIGHT SECOND TOE  COMPARISON:  None.  FINDINGS: There is no evidence of fracture or dislocation. There is no evidence of  osteolytic changes. Diffuse soft tissue swelling of the second digit.  IMPRESSION: No radiographic evidence of osteomyelitis.  Diffuse soft tissue swelling of the second digit without soft tissue emphysema.   Electronically Signed   By: Fidela Salisbury M.D.   On: 03/29/2017 22:08     Statin:  Yes.   Beta Blocker:  No. Aspirin:  Yes.   ACEI:  Yes.   ARB:  No. CCB use:  No Other antiplatelets/anticoagulants:  No.   ASSESSMENT/PLAN: This is a 62 y.o. female who has been admitted to the hospital with right foot pain and second toe gangrene in the presence of PAD and insulin-dependent diabetes mellitus  Patient also with asymptomatic critical left ICA stenosis and right ICA 75% stenosis by CTA Agree with admission for IV antibiotics and pain control Feet are symmetrically warm and right foot is not acutely ischemic based on my exam findings and history Timing of carotid revascularization and right lower extremity revascularization will be discussed with Dr. Army Chaco PA-C [facility] [phone]   I agree with the above.  I have seen and evaluated the patient.  She presented to the emergency department with worsening pain in her right leg.  She has no signs of acute ischemia.  This is a chronic process.  We discussed proceeding withLeft carotid endarterectomy prior to revascularization of her right leg.  I discussed the details of carotid endarterectomy including the risk of stroke, nerve injury, and cardiopulmonary complications.  She understands this will need to be done prior to her bypass to her right leg.  I have placed her on the operating schedule for Friday, January 18.  She can be discharged home today with pain medication.  [name]

## 2017-03-30 NOTE — ED Notes (Signed)
MD Niu at bedside  

## 2017-03-30 NOTE — ED Notes (Signed)
ED Provider at bedside. 

## 2017-03-30 NOTE — ED Notes (Signed)
Pt reports Dr. Trula Slade reports they are going to send her out and home with pain meds. No orders at this time or note. Will move to POD F, and will investigate further. Pt wants to go home, and already got dressed.

## 2017-03-30 NOTE — H&P (Signed)
History and Physical    [name] [name] DOB: [birth year] DOA: 03/30/2017  Referring MD/NP/PA:   PCP: [name] Pi, MD   Patient coming from:  The patient is coming from home.  At baseline, pt is independent for most of ADL.   Chief Complaint: right foot pain  HPI: [name] [name] is a 62 y.o. female with medical history significant of PVD, hypertension, hyperlipidemia, diabetes mellitus, COPD, depression, tobacco abuse, thrombocytopenia, who presents with right foot pain.  Patient states that she has been having bilateral lower extremity pain due to PVD for several months. VVS, Dr. Trula Slade is doing work up and planning bypass surgery for her. Patient states that in the past 3 weeks, she has worsening right foot pain, much worse today. The pain is constant, severe, sharp, nonradiating. It is not aggravated or alleviated by any known factors. Her left second toe become black. The right foot is mildly red. She does not have fever or chills. Denies chest pain, shortness breath. She has chronic mild cough due to COPD, which has not changed.  No nausea, vomiting, diarrhea or abdominal pain. No symptoms of UTI or unilateral weakness.  ED Course: pt was found to have WBC 9.8, electrolytes renal function okay, temperature normal, tachycardia, no tachypnea, 79-94% on room air. X-ray of her right second toe did not show evidence of osteomyelitis. Patient is admitted to telemetry bed as inpatient. Vascular surgeon, Dr. Trula Slade was consulted.  Review of Systems:   General: no fevers, chills, no body weight gain, has poor appetite, has fatigue HEENT: no blurry vision, hearing changes or sore throat Respiratory: no dyspnea, coughing, wheezing CV: no chest pain, no palpitations GI: no nausea, vomiting, abdominal pain, diarrhea, constipation GU: no dysuria, burning on urination, increased urinary frequency, hematuria  Ext: no leg edema. Has right foot pain. Neuro: no unilateral weakness,  numbness, or tingling, no vision change or hearing loss Skin: has psoriasis rashes in legs.  MSK: No muscle spasm, no deformity, no limitation of range of movement in spin Heme: No easy bruising.  Travel history: No recent long distant travel.  Allergy:  Allergies  Allergen Reactions  . Iohexol Rash    Pt states she had a rash 35-40 yrs ago during a procedure.  Benadryl was given and pt was fine in 30 mins. She has had multiple CT's since with premeds and has done fine.  No anaphylaxis per pt. I updated this record. [name], RTRCT  03/06/17  . Penicillins Rash    Has patient had a PCN reaction causing immediate rash, facial/tongue/throat swelling, SOB or lightheadedness with hypotension: Yes Has patient had a PCN reaction causing severe rash involving mucus membranes or skin necrosis: No Has patient had a PCN reaction that required hospitalization: No Has patient had a PCN reaction occurring within the last 10 years: No Can take amoxicillin     . Sulfa Antibiotics Rash    Past Medical History:  Diagnosis Date  . Abdominal abscess   . Allergic rhinitis   . Asthma   . Carotid stenosis, left 04/2009   50-69%  . Colon polyps 12/05  . COPD (chronic obstructive pulmonary disease) ([place])   . Depression   . Diabetes mellitus    followed by Dr. Chalmers Cater  . Folate deficiency   . Hyperlipidemia   . Hypertension   . Lumbar disc disease    h/o hnp with repair  . Obesity, morbid (more than 100 lbs over ideal weight or BMI > 40) (HCC)   .  Thrombocytopenia ([place])    work up with Dr. Griffith Citron neg in 2000  . Tobacco abuse     Past Surgical History:  Procedure Laterality Date  . ABDOMINAL AORTOGRAM W/LOWER EXTREMITY N/A 02/28/2017   Procedure: ABDOMINAL AORTOGRAM W/LOWER EXTREMITY;  Surgeon: Serafina Mitchell, MD;  Location: [place] [facility];  Service: Cardiovascular;  Laterality: N/A;  Bilateral  . BACK SURGERY  1989   LS  for HNP  . CHOLECYSTECTOMY  1982  . HERNIA REPAIR  2001    incisional  . INCISE AND DRAIN ABCESS  01/11/11   abd abscess    Social History:  reports that she has been smoking cigarettes.  She has a 45.00 pack-year smoking history. she has never used smokeless tobacco. She reports that she does not drink alcohol or use drugs.  Family History:  Family History  Problem Relation Age of Onset  . Asthma Mother   . Hypertension Mother   . Stroke Father   . Alcohol abuse Father   . Cancer Sister        brain stem tumor  . Suicidality Sister   . Cancer Sister        breast  . Suicidality Brother   . Dementia Sister      Prior to Admission medications   Medication Sig Start Date End Date Taking? Authorizing Provider  albuterol (PROVENTIL HFA;VENTOLIN HFA) 108 (90 BASE) MCG/ACT inhaler Inhale 2 puffs into the lungs every 6 (six) hours as needed for wheezing.    [provider]  aspirin 81 MG EC tablet Take 81 mg by mouth daily.      [provider]  atorvastatin (LIPITOR) 20 MG tablet TAKE 1 TABLET DAILY 03/13/13   Schoenhoff, Altamese Cabal, MD  Cholecalciferol (VITAMIN D3) 1000 UNITS CAPS Take 1,000 Units by mouth 2 (two) times daily.     [provider]  CINNAMON PO Take 1,000 mg by mouth 2 (two) times daily.    [provider]  clobetasol ointment (TEMOVATE) 3.11 % Apply 1 application topically 2 (two) times daily as needed (psoriasis).    [provider]  folic acid (FOLVITE) 216 MCG tablet Take 800 mcg by mouth daily.     [provider]  hydrochlorothiazide (HYDRODIURIL) 25 MG tablet TAKE 1 TABLET DAILY    Schoenhoff, Altamese Cabal, MD  insulin aspart protamine - aspart (NOVOLOG MIX 70/30 FLEXPEN) (70-30) 100 UNIT/ML FlexPen Inject 60 Units into the skin 2 (two) times daily.    [provider]  lisinopril (PRINIVIL,ZESTRIL) 5 MG tablet Take 5 mg by mouth daily.    [provider]  metFORMIN (GLUCOPHAGE) 1000 MG tablet Take 1,000 mg by mouth 2 (two) times daily with a meal.     [provider]  naproxen sodium (ALEVE) 220 MG tablet Take 220 mg by mouth daily as needed (pain).    [provider]  sertraline (ZOLOFT) 100 MG tablet TAKE 2 TABLETS DAILY Patient taking differently: Take 100 mg by mouth once daily 01/01/14   Lanice Shirts, MD    Physical Exam: Vitals:   03/30/17 0330 03/30/17 0345 03/30/17 0500 03/30/17 0545  BP: 139/74 (!) 112/99 (!) 163/91 (!) 147/64  Pulse: (!) 111 (!) 110 (!) 112 (!) 110  Resp:  '16 20 18  ' Temp:      TempSrc:      SpO2: (!) 79% 92% 94% 90%  Weight:      Height:       General: Not in  acute distress HEENT:       Eyes: PERRL, EOMI, no scleral icterus.       ENT: No discharge from the ears and nose, no pharynx injection, no tonsillar enlargement.        Neck: No JVD, no bruit, no mass felt. Heme: No neck lymph node enlargement. Cardiac: S1/S2, RRR, No murmurs, No gallops or rubs. Respiratory:  No rales, wheezing, rhonchi or rubs. GI: Soft, nondistended, nontender, no rebound pain, no organomegaly, BS present. GU: No hematuria Ext: No pitting leg edema bilaterally. 1+DP/PT in the left leg. Pulse is not palpable in the right leg. Musculoskeletal: No joint deformities, No joint redness or warmth, no limitation of ROM in spin. Skin: Has psoriasis rashes in both legs. The right foot is tender, cold, erythematous Neuro: Alert, oriented X3, cranial nerves II-XII grossly intact, moves all extremities normally. Psych: Patient is not psychotic, no suicidal or hemocidal ideation.  Labs on Admission: I have personally reviewed following labs and imaging studies  CBC: Recent Labs  Lab 03/29/17 2122  WBC 9.8  NEUTROABS 6.3  HGB 15.2*  HCT 46.0  MCV 82.7  PLT 161*   Basic Metabolic Panel: Recent Labs  Lab 03/29/17 2122  NA 132*  K 4.0  CL 95*  CO2 26  GLUCOSE 152*  BUN 6  CREATININE 0.49  CALCIUM 9.4   GFR: Estimated Creatinine Clearance: 96.2 mL/min (by C-G formula based on SCr of 0.49  mg/dL). Liver Function Tests: No results for input(s): AST, ALT, ALKPHOS, BILITOT, PROT, ALBUMIN in the last 168 hours. No results for input(s): LIPASE, AMYLASE in the last 168 hours. No results for input(s): AMMONIA in the last 168 hours. Coagulation Profile: Recent Labs  Lab 03/30/17 0528  INR 1.06   Cardiac Enzymes: No results for input(s): CKTOTAL, CKMB, CKMBINDEX, TROPONINI in the last 168 hours. BNP (last 3 results) No results for input(s): PROBNP in the last 8760 hours. HbA1C: No results for input(s): HGBA1C in the last 72 hours. CBG: No results for input(s): GLUCAP in the last 168 hours. Lipid Profile: No results for input(s): CHOL, HDL, LDLCALC, TRIG, CHOLHDL, LDLDIRECT in the last 72 hours. Thyroid Function Tests: No results for input(s): TSH, T4TOTAL, FREET4, T3FREE, THYROIDAB in the last 72 hours. Anemia Panel: No results for input(s): VITAMINB12, FOLATE, FERRITIN, TIBC, IRON, RETICCTPCT in the last 72 hours. Urine analysis:    Component Value Date/Time   COLORURINE YELLOW 08/09/2007 0200   APPEARANCEUR CLEAR 08/09/2007 0200   LABSPEC 1.005 08/09/2007 0200   PHURINE 7.0 08/09/2007 0200   GLUCOSEU NEGATIVE 08/09/2007 0200   HGBUR NEGATIVE 08/09/2007 0200   BILIRUBINUR neg 01/28/2013 1327   KETONESUR NEGATIVE 08/09/2007 0200   PROTEINUR neg 01/28/2013 1327   PROTEINUR NEGATIVE 08/09/2007 0200   UROBILINOGEN negative 01/28/2013 1327   UROBILINOGEN 1.0 08/09/2007 0200   NITRITE neg 01/28/2013 1327   NITRITE NEGATIVE 08/09/2007 0200   LEUKOCYTESUR Negative 01/28/2013 1327   Sepsis Labs: '@LABRCNTIP' (procalcitonin:4,lacticidven:4) )No results found for this or any previous visit (from the past 240 hour(s)).   Radiological Exams on Admission: Dg Toe 2nd Right  Result Date: 03/29/2017 CLINICAL DATA:  Swelling and infection of the right second toe. EXAM: RIGHT SECOND TOE COMPARISON:  None. FINDINGS: There is no evidence of fracture or dislocation. There is no  evidence of osteolytic changes. Diffuse soft tissue swelling of the second digit. IMPRESSION: No radiographic evidence of osteomyelitis. Diffuse soft tissue swelling of the second digit without soft tissue emphysema. Electronically Signed  By: Fidela Salisbury M.D.   On: 03/29/2017 22:08     EKG:   Not done in ED, will get one.   Assessment/Plan Principal Problem:   Right foot pain Active Problems:   Depression   DM (diabetes mellitus), type 2 with peripheral vascular complications (HCC)   Hyperlipidemia   Hypertension   Thrombocytopenia (HCC)   Obesity, morbid (more than 100 lbs over ideal weight or BMI > 40) (HCC)   Tobacco abuse   Emphysema/COPD (HCC)   PVD (peripheral vascular disease) (HCC)   Right foot pain in the setting of PVD: pt has gangrene in 2 right second toe. The right foot is erythematous, cannot completely rule out cellulitis. Patient does not have fever or leukocytosis, does not meet criteria for sepsis. Currently hemodynamically stable. VVS, Dr. Trula Slade was consulted.  - will admit to tele bed as inpt - Empiric antimicrobial treatment with vancomycin  - PRN Zofran for nausea, morphine and Percocet for pain - Blood cultures x 2  - ESR and CRP - will get Procalcitonin and trend lactic acid levels per sepsis protocol. - IVF:  100 cc/h - f/u VVS 's recommendations.  PVD (peripheral vascular disease) ([place]): Dr. Trula Slade is doing work up for perforation of bypasses surgery. Pt states that she was given a referral to cardiologist, Dr. Angelena Form for presurgery clearance. She had 2-D echo on 02/26/17 which showed EF of 65-70% with grade 1 diastolic dysfunction. She had Myoview stress test. "Myocardial perfusion is abnormal. This is a low risk study. Overall left ventricular systolic function was normal. LV cavity size is normal. Nuclear stress EF: 58%. The left ventricular ejection fraction is normal (55-65%)".  -f/u Dr. Stephens Shire recommendations.  Depression: Stable, no  suicidal or homicidal ideations. -Continue home medication: Zoloft  DM (diabetes mellitus), type 2 with peripheral vascular complications ([place]): Last A1c 8.0, poorly controled. Patient is taking metformin and 70/30 insulin at home -will decrease 70/30 insulin dose from 60 to 30 U bid -SSI  HLD: -Lipitor  HTN:  -Continue home lisinoril - hold HCTZ since pt need IVF when on NPO -IV hydralazine prn  Thrombocytopenia ([place]): Unclear etiology, platelet of 119. No bleeding tendency -Follow up by CBC  Tobacco abuse: -Did counseling about importance of quitting smoking -Nicotine patch  COPD: stable -prn albuterol nebs    DVT ppx: SCD Code Status: Full code Family Communication:  Yes, patient's  duaghter at bed side Disposition Plan:  Anticipate discharge back to previous home environment Consults called: VVS, Dr Trula Slade  Admission status: Inpatient/tele      Date of Service 03/30/2017    Ivor Costa [facility] Pager [phone]  If 7PM-7AM, please contact night-coverage [email or web address] Password [email or web address] 03/30/2017, 6:30 AM

## 2017-03-30 NOTE — Discharge Instructions (Signed)

## 2017-03-30 NOTE — ED Notes (Signed)
Pt placed in recliner chair, was uncomfortable, restless in bed. Appears more comfortable.

## 2017-03-30 NOTE — Progress Notes (Signed)
Pharmacy Antibiotic Note  [name] [name] is a 62 y.o. female admitted on 03/30/2017 with RLE cellulitis.  Pharmacy has been consulted for Vancomycin dosing.  Vancomycin 2 g IV given in ED at [place]: Vancomycin 1250 mg IV q12h  Height: 5\' 7"  (170.2 cm) Weight: 263 lb (119.3 kg) IBW/kg (Calculated) : 61.6  Temp (24hrs), Avg:97.9 F (36.6 C), Min:97.9 F (36.6 C), Max:97.9 F (36.6 C)  Recent Labs  Lab 03/29/17 2122 03/30/17 0528  WBC 9.8  --   CREATININE 0.49  --   LATICACIDVEN  --  1.2    Estimated Creatinine Clearance: 96.2 mL/min (by C-G formula based on SCr of 0.49 mg/dL).    Allergies  Allergen Reactions  . Iohexol Rash    Pt states she had a rash 35-40 yrs ago during a procedure.  Benadryl was given and pt was fine in 30 mins. She has had multiple CT's since with premeds and has done fine.  No anaphylaxis per pt. I updated this record. [name] [name], RTRCT  03/06/17  . Penicillins Rash    Has patient had a PCN reaction causing immediate rash, facial/tongue/throat swelling, SOB or lightheadedness with hypotension: Yes Has patient had a PCN reaction causing severe rash involving mucus membranes or skin necrosis: No Has patient had a PCN reaction that required hospitalization: No Has patient had a PCN reaction occurring within the last 10 years: No Can take amoxicillin     . Sulfa Antibiotics Rash   [name] [name] 03/30/2017 6:40 AM
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
## 2017-03-31 ENCOUNTER — Telehealth (HOSPITAL_BASED_OUTPATIENT_CLINIC_OR_DEPARTMENT_OTHER): Payer: Self-pay | Admitting: *Deleted

## 2017-03-31 ENCOUNTER — Other Ambulatory Visit: Payer: Self-pay | Admitting: *Deleted

## 2017-03-31 LAB — BLOOD CULTURE ID PANEL (REFLEXED)
Acinetobacter baumannii: NOT DETECTED
CANDIDA TROPICALIS: NOT DETECTED
Candida albicans: NOT DETECTED
Candida glabrata: NOT DETECTED
Candida krusei: NOT DETECTED
Candida parapsilosis: NOT DETECTED
Enterobacter cloacae complex: NOT DETECTED
Enterobacteriaceae species: NOT DETECTED
Enterococcus species: NOT DETECTED
Escherichia coli: NOT DETECTED
Haemophilus influenzae: NOT DETECTED
KLEBSIELLA PNEUMONIAE: NOT DETECTED
Klebsiella oxytoca: NOT DETECTED
Listeria monocytogenes: NOT DETECTED
NEISSERIA MENINGITIDIS: NOT DETECTED
PROTEUS SPECIES: NOT DETECTED
PSEUDOMONAS AERUGINOSA: NOT DETECTED
SERRATIA MARCESCENS: NOT DETECTED
STAPHYLOCOCCUS AUREUS BCID: NOT DETECTED
STAPHYLOCOCCUS SPECIES: NOT DETECTED
STREPTOCOCCUS AGALACTIAE: NOT DETECTED
STREPTOCOCCUS PNEUMONIAE: NOT DETECTED
Streptococcus pyogenes: NOT DETECTED
Streptococcus species: DETECTED — AB

## 2017-03-31 NOTE — Progress Notes (Signed)
Call to patient and instructed to be at [facility] 04/07/17 at 10:15 am for surgery. NPO past MN night prior and follow the detailed instructions received from the [facility] department about this surgery. Instructed to stay on Aspirin.

## 2017-04-03 LAB — CULTURE, BLOOD (ROUTINE X 2): Special Requests: ADEQUATE

## 2017-04-04 ENCOUNTER — Telehealth: Payer: Self-pay | Admitting: Emergency Medicine

## 2017-04-04 ENCOUNTER — Inpatient Hospital Stay (HOSPITAL_COMMUNITY)
Admission: EM | Admit: 2017-04-04 | Discharge: 2017-04-10 | DRG: 253 | Disposition: A | Discharge status: Home Health | Payer: BLUE CROSS/BLUE SHIELD | Attending: Internal Medicine | Admitting: Internal Medicine

## 2017-04-04 ENCOUNTER — Encounter (HOSPITAL_COMMUNITY): Payer: Self-pay | Admitting: Emergency Medicine

## 2017-04-04 ENCOUNTER — Other Ambulatory Visit: Payer: Self-pay

## 2017-04-04 ENCOUNTER — Emergency Department (HOSPITAL_COMMUNITY): Payer: BLUE CROSS/BLUE SHIELD

## 2017-04-04 DIAGNOSIS — I6529 Occlusion and stenosis of unspecified carotid artery: Secondary | ICD-10-CM | POA: Diagnosis present

## 2017-04-04 DIAGNOSIS — D696 Thrombocytopenia, unspecified: Secondary | ICD-10-CM | POA: Diagnosis present

## 2017-04-04 DIAGNOSIS — E872 Acidosis: Secondary | ICD-10-CM | POA: Diagnosis present

## 2017-04-04 DIAGNOSIS — Z6841 Body Mass Index (BMI) 40.0 and over, adult: Secondary | ICD-10-CM | POA: Diagnosis not present

## 2017-04-04 DIAGNOSIS — E1152 Type 2 diabetes mellitus with diabetic peripheral angiopathy with gangrene: Principal | ICD-10-CM | POA: Diagnosis present

## 2017-04-04 DIAGNOSIS — F329 Major depressive disorder, single episode, unspecified: Secondary | ICD-10-CM | POA: Diagnosis present

## 2017-04-04 DIAGNOSIS — Z9889 Other specified postprocedural states: Secondary | ICD-10-CM | POA: Diagnosis not present

## 2017-04-04 DIAGNOSIS — Z7984 Long term (current) use of oral hypoglycemic drugs: Secondary | ICD-10-CM | POA: Diagnosis not present

## 2017-04-04 DIAGNOSIS — Z79899 Other long term (current) drug therapy: Secondary | ICD-10-CM | POA: Diagnosis not present

## 2017-04-04 DIAGNOSIS — I70201 Unspecified atherosclerosis of native arteries of extremities, right leg: Secondary | ICD-10-CM | POA: Diagnosis present

## 2017-04-04 DIAGNOSIS — Z882 Allergy status to sulfonamides status: Secondary | ICD-10-CM | POA: Diagnosis not present

## 2017-04-04 DIAGNOSIS — I6522 Occlusion and stenosis of left carotid artery: Secondary | ICD-10-CM | POA: Diagnosis present

## 2017-04-04 DIAGNOSIS — Z885 Allergy status to narcotic agent status: Secondary | ICD-10-CM

## 2017-04-04 DIAGNOSIS — I739 Peripheral vascular disease, unspecified: Secondary | ICD-10-CM | POA: Diagnosis present

## 2017-04-04 DIAGNOSIS — M79671 Pain in right foot: Secondary | ICD-10-CM | POA: Diagnosis not present

## 2017-04-04 DIAGNOSIS — F17210 Nicotine dependence, cigarettes, uncomplicated: Secondary | ICD-10-CM | POA: Diagnosis not present

## 2017-04-04 DIAGNOSIS — I10 Essential (primary) hypertension: Secondary | ICD-10-CM | POA: Diagnosis not present

## 2017-04-04 DIAGNOSIS — K432 Incisional hernia without obstruction or gangrene: Secondary | ICD-10-CM | POA: Diagnosis present

## 2017-04-04 DIAGNOSIS — E785 Hyperlipidemia, unspecified: Secondary | ICD-10-CM | POA: Diagnosis not present

## 2017-04-04 DIAGNOSIS — J309 Allergic rhinitis, unspecified: Secondary | ICD-10-CM | POA: Diagnosis present

## 2017-04-04 DIAGNOSIS — R7881 Bacteremia: Secondary | ICD-10-CM | POA: Diagnosis present

## 2017-04-04 DIAGNOSIS — Z794 Long term (current) use of insulin: Secondary | ICD-10-CM

## 2017-04-04 DIAGNOSIS — L089 Local infection of the skin and subcutaneous tissue, unspecified: Secondary | ICD-10-CM

## 2017-04-04 DIAGNOSIS — J449 Chronic obstructive pulmonary disease, unspecified: Secondary | ICD-10-CM | POA: Diagnosis present

## 2017-04-04 DIAGNOSIS — E1151 Type 2 diabetes mellitus with diabetic peripheral angiopathy without gangrene: Secondary | ICD-10-CM | POA: Diagnosis not present

## 2017-04-04 DIAGNOSIS — E11628 Type 2 diabetes mellitus with other skin complications: Secondary | ICD-10-CM | POA: Diagnosis not present

## 2017-04-04 DIAGNOSIS — L03031 Cellulitis of right toe: Secondary | ICD-10-CM | POA: Diagnosis present

## 2017-04-04 DIAGNOSIS — Z91041 Radiographic dye allergy status: Secondary | ICD-10-CM | POA: Diagnosis not present

## 2017-04-04 DIAGNOSIS — L03115 Cellulitis of right lower limb: Secondary | ICD-10-CM | POA: Diagnosis present

## 2017-04-04 DIAGNOSIS — I96 Gangrene, not elsewhere classified: Secondary | ICD-10-CM | POA: Diagnosis not present

## 2017-04-04 DIAGNOSIS — I998 Other disorder of circulatory system: Secondary | ICD-10-CM | POA: Diagnosis not present

## 2017-04-04 DIAGNOSIS — Z7982 Long term (current) use of aspirin: Secondary | ICD-10-CM | POA: Diagnosis not present

## 2017-04-04 DIAGNOSIS — R0981 Nasal congestion: Secondary | ICD-10-CM | POA: Diagnosis not present

## 2017-04-04 DIAGNOSIS — Z880 Allergy status to penicillin: Secondary | ICD-10-CM | POA: Diagnosis not present

## 2017-04-04 DIAGNOSIS — R7989 Other specified abnormal findings of blood chemistry: Secondary | ICD-10-CM | POA: Diagnosis not present

## 2017-04-04 DIAGNOSIS — R600 Localized edema: Secondary | ICD-10-CM | POA: Diagnosis not present

## 2017-04-04 HISTORY — DX: Bacteremia: R78.81

## 2017-04-04 LAB — COMPREHENSIVE METABOLIC PANEL
ALT: 20 U/L (ref 14–54)
AST: 23 U/L (ref 15–41)
Albumin: 3.6 g/dL (ref 3.5–5.0)
Alkaline Phosphatase: 105 U/L (ref 38–126)
Anion gap: 13 (ref 5–15)
BUN: 7 mg/dL (ref 6–20)
CO2: 25 mmol/L (ref 22–32)
Calcium: 9.5 mg/dL (ref 8.9–10.3)
Chloride: 92 mmol/L — ABNORMAL LOW (ref 101–111)
Creatinine, Ser: 0.54 mg/dL (ref 0.44–1.00)
GFR calc Af Amer: >60 mL/min (ref >60)
GFR calc non Af Amer: >60 mL/min (ref >60)
Glucose, Bld: 284 mg/dL — ABNORMAL HIGH (ref 65–99)
Potassium: 4.1 mmol/L (ref 3.5–5.1)
Sodium: 130 mmol/L — ABNORMAL LOW (ref 135–145)
Total Bilirubin: 0.6 mg/dL (ref 0.3–1.2)
Total Protein: 6.3 g/dL — ABNORMAL LOW (ref 6.5–8.1)

## 2017-04-04 LAB — CBC WITH DIFFERENTIAL/PLATELET
Basophils Absolute: 0 10*3/uL (ref 0.0–0.1)
Basophils Relative: 0 %
Eosinophils Absolute: 0.1 10*3/uL (ref 0.0–0.7)
Eosinophils Relative: 2 %
HCT: 43.1 % (ref 36.0–46.0)
Hemoglobin: 14.5 g/dL (ref 12.0–15.0)
Lymphocytes Relative: 19 %
Lymphs Abs: 1.6 10*3/uL (ref 0.7–4.0)
MCH: 27.9 pg (ref 26.0–34.0)
MCHC: 33.6 g/dL (ref 30.0–36.0)
MCV: 83 fL (ref 78.0–100.0)
Monocytes Absolute: 0.7 10*3/uL (ref 0.1–1.0)
Monocytes Relative: 8 %
Neutro Abs: 6.2 10*3/uL (ref 1.7–7.7)
Neutrophils Relative %: 71 %
Platelets: 147 10*3/uL — ABNORMAL LOW (ref 150–400)
RBC: 5.19 MIL/uL — ABNORMAL HIGH (ref 3.87–5.11)
RDW: 14.9 % (ref 11.5–15.5)
WBC: 8.8 10*3/uL (ref 4.0–10.5)

## 2017-04-04 LAB — CULTURE, BLOOD (ROUTINE X 2): SPECIAL REQUESTS: ADEQUATE

## 2017-04-04 LAB — GLUCOSE, CAPILLARY: GLUCOSE-CAPILLARY: 238 mg/dL — AB (ref 65–99)

## 2017-04-04 LAB — I-STAT CG4 LACTIC ACID, ED
Lactic Acid, Venous: 1.98 mmol/L — ABNORMAL HIGH (ref 0.5–1.9)
Lactic Acid, Venous: 0.65 mmol/L (ref 0.5–1.9)

## 2017-04-04 MED ORDER — DEXTROSE 5 % IV SOLN
2.0000 g | INTRAVENOUS | Status: DC
  Administered 2017-04-05 – 2017-04-10 (×6): 2 g via INTRAVENOUS
  Filled 2017-04-04 (×6): qty 2

## 2017-04-04 MED ORDER — FOLIC ACID 1 MG PO TABS
1.0000 mg | ORAL_TABLET | Freq: Every day | ORAL | Status: DC
  Administered 2017-04-05 – 2017-04-10 (×5): 1 mg via ORAL
  Filled 2017-04-04 (×5): qty 1

## 2017-04-04 MED ORDER — CEFEPIME HCL 2 G IJ SOLR
2.0000 g | Freq: Three times a day (TID) | INTRAMUSCULAR | Status: DC
  Administered 2017-04-04: 2 g via INTRAVENOUS
  Filled 2017-04-04: qty 2

## 2017-04-04 MED ORDER — ONDANSETRON HCL 4 MG PO TABS: 4.0000 mg | ORAL_TABLET | Freq: Four times a day (QID) | ORAL | Status: DC | PRN

## 2017-04-04 MED ORDER — HYDROCODONE-ACETAMINOPHEN 5-325 MG PO TABS
1.0000 | ORAL_TABLET | Freq: Four times a day (QID) | ORAL | Status: DC | PRN
  Administered 2017-04-05: 1 via ORAL
  Filled 2017-04-04: qty 1

## 2017-04-04 MED ORDER — LISINOPRIL 5 MG PO TABS
5.0000 mg | ORAL_TABLET | Freq: Every day | ORAL | Status: DC
  Administered 2017-04-05 – 2017-04-10 (×5): 5 mg via ORAL
  Filled 2017-04-04 (×5): qty 1

## 2017-04-04 MED ORDER — FOLIC ACID 800 MCG PO TABS: 800.0000 ug | ORAL_TABLET | Freq: Every day | ORAL | Status: DC

## 2017-04-04 MED ORDER — SERTRALINE HCL 100 MG PO TABS
100.0000 mg | ORAL_TABLET | Freq: Every day | ORAL | Status: DC
  Administered 2017-04-05 – 2017-04-10 (×5): 100 mg via ORAL
  Filled 2017-04-04 (×5): qty 1

## 2017-04-04 MED ORDER — ONDANSETRON HCL 4 MG/2ML IJ SOLN: 4.0000 mg | Freq: Four times a day (QID) | INTRAMUSCULAR | Status: DC | PRN

## 2017-04-04 MED ORDER — ENOXAPARIN SODIUM 40 MG/0.4ML ~~LOC~~ SOLN
40.0000 mg | SUBCUTANEOUS | Status: DC
  Administered 2017-04-05: 40 mg via SUBCUTANEOUS
  Filled 2017-04-04: qty 0.4

## 2017-04-04 MED ORDER — INSULIN ASPART PROT & ASPART (70-30 MIX) 100 UNIT/ML ~~LOC~~ SUSP
60.0000 [IU] | Freq: Two times a day (BID) | SUBCUTANEOUS | Status: DC
  Administered 2017-04-05 (×2): 60 [IU] via SUBCUTANEOUS
  Filled 2017-04-04: qty 10

## 2017-04-04 MED ORDER — ASPIRIN 81 MG PO TBEC: 81.0000 mg | DELAYED_RELEASE_TABLET | Freq: Every day | ORAL | Status: DC

## 2017-04-04 MED ORDER — SODIUM CHLORIDE 0.9 % IV BOLUS (SEPSIS)
1000.0000 mL | Freq: Once | INTRAVENOUS | Status: AC
  Administered 2017-04-04: 1000 mL via INTRAVENOUS

## 2017-04-04 MED ORDER — ACETAMINOPHEN 650 MG RE SUPP: 650.0000 mg | Freq: Four times a day (QID) | RECTAL | Status: DC | PRN

## 2017-04-04 MED ORDER — HYDROCHLOROTHIAZIDE 25 MG PO TABS
25.0000 mg | ORAL_TABLET | Freq: Every day | ORAL | Status: DC
  Administered 2017-04-05 – 2017-04-08 (×3): 25 mg via ORAL
  Filled 2017-04-04 (×3): qty 1

## 2017-04-04 MED ORDER — ASPIRIN EC 81 MG PO TBEC
81.0000 mg | DELAYED_RELEASE_TABLET | Freq: Every day | ORAL | Status: DC
  Administered 2017-04-05 – 2017-04-10 (×5): 81 mg via ORAL
  Filled 2017-04-04 (×6): qty 1

## 2017-04-04 MED ORDER — ALBUTEROL SULFATE (2.5 MG/3ML) 0.083% IN NEBU
3.0000 mL | INHALATION_SOLUTION | Freq: Four times a day (QID) | RESPIRATORY_TRACT | Status: DC | PRN
Start: 2017-04-04 — End: 2017-04-07
  Administered 2017-04-05: 3 mL via RESPIRATORY_TRACT
  Filled 2017-04-04: qty 3

## 2017-04-04 MED ORDER — ATORVASTATIN CALCIUM 20 MG PO TABS
20.0000 mg | ORAL_TABLET | Freq: Every day | ORAL | Status: DC
  Administered 2017-04-05 – 2017-04-10 (×5): 20 mg via ORAL
  Filled 2017-04-04 (×5): qty 1

## 2017-04-04 MED ORDER — ACETAMINOPHEN 325 MG PO TABS: 650.0000 mg | ORAL_TABLET | Freq: Four times a day (QID) | ORAL | Status: DC | PRN

## 2017-04-04 MED ORDER — SERTRALINE HCL 100 MG PO TABS: 200.0000 mg | ORAL_TABLET | Freq: Every day | ORAL | Status: DC

## 2017-04-04 MED ORDER — INSULIN ASPART 100 UNIT/ML ~~LOC~~ SOLN
0.0000 [IU] | Freq: Three times a day (TID) | SUBCUTANEOUS | Status: DC
  Administered 2017-04-05: 1 [IU] via SUBCUTANEOUS
  Administered 2017-04-06: 2 [IU] via SUBCUTANEOUS
  Administered 2017-04-06: 1 [IU] via SUBCUTANEOUS
  Administered 2017-04-07: 3 [IU] via SUBCUTANEOUS
  Administered 2017-04-08 (×2): 2 [IU] via SUBCUTANEOUS
  Administered 2017-04-08 – 2017-04-09 (×2): 1 [IU] via SUBCUTANEOUS
  Administered 2017-04-10: 2 [IU] via SUBCUTANEOUS
  Administered 2017-04-10: 1 [IU] via SUBCUTANEOUS

## 2017-04-04 MED ORDER — OXYCODONE-ACETAMINOPHEN 5-325 MG PO TABS
2.0000 | ORAL_TABLET | Freq: Once | ORAL | Status: AC
  Administered 2017-04-04: 2 via ORAL
  Filled 2017-04-04: qty 2

## 2017-04-04 NOTE — ED Triage Notes (Addendum)
Pt reports she had blood drawn last week when she was here for an infected right 2nd toe, was called today stating she had a positive blood culture and needed to return for iv abx, nad. Pt reports she is on keflex.

## 2017-04-04 NOTE — Pre-Procedure Instructions (Signed)
[name] [name]  04/04/2017      [id number] [address], [place] [address] Phone: [phone] Fax: [phone]  [id number] [place], [place] [address]  [place] [address] Phone: [phone] Fax: [phone]    Your procedure is scheduled on 04/07/2017.  Report to [facility] Admitting at 1045 AM.  Call this number if you have problems the morning of surgery:  [phone]   Remember:  Do not eat food or drink liquids after midnight.  Take these medicines the morning of surgery with A SIP OF WATER albuterol inhaler (bring inhaler with you), hydrocodone-acetaminophen (norco)-if needed for pain, sertraline (zoloft).  Beginning now, STOP taking any Aspirin (unless otherwise instructed by your surgeon), Aleve, Naproxen, Ibuprofen, Motrin, Advil, Goody's, BC's, all herbal medications, fish oil, and all vitamins  Continue all other medications as instructed by your physician except follow the above medication instructions before surgery  How to Manage Your Diabetes Before and After Surgery   WHAT DO I DO ABOUT MY DIABETES MEDICATION?  Marland Kitchen Do not take oral diabetes medicines (pills) the morning of surgery, metformin (glucophage).  . THE NIGHT BEFORE SURGERY, take 42  units of novolog mix 70/30 insulin.      . THE MORNING OF SURGERY, take no novolog 70/30 insulin.  . The day of surgery, do not take other diabetes injectables, including Byetta (exenatide), Bydureon (exenatide ER), Victoza (liraglutide), or Trulicity (dulaglutide).  Why is it important to control my blood sugar before and after surgery? . Improving blood sugar levels before and after surgery helps healing and can limit problems. . A way of improving blood sugar control is eating a healthy diet by: o  Eating less sugar and carbohydrates o  Increasing  activity/exercise o  Talking with your doctor about reaching your blood sugar goals . High blood sugars (greater than 180 mg/dL) can raise your risk of infections and slow your recovery, so you will need to focus on controlling your diabetes during the weeks before surgery. . Make sure that the doctor who takes care of your diabetes knows about your planned surgery including the date and location.  How do I manage my blood sugar before surgery? . Check your blood sugar at least 4 times a day, starting 2 days before surgery, to make sure that the level is not too high or low. o Check your blood sugar the morning of your surgery when you wake up and every 2 hours until you get to the Short Stay unit. . If your blood sugar is less than 70 mg/dL, you will need to treat for low blood sugar: o Do not take insulin. o Treat a low blood sugar (less than 70 mg/dL) with  cup of clear juice (cranberry or apple), 4 glucose tablets, OR glucose gel. Recheck blood sugar in 15 minutes after treatment (to make sure it is greater than 70 mg/dL). If your blood sugar is not greater than 70 mg/dL on recheck, call [phone] o  for further instructions. . Report your blood sugar to the short stay nurse when you get to Short Stay.  . If you are admitted to the hospital after surgery: o Your blood sugar will be checked by the staff and you will probably be given insulin after surgery (instead of oral diabetes medicines) to make sure you have good blood sugar levels. o  The goal for blood sugar control after surgery is 80-180 mg/dL.  Reviewed and Endorsed by [facility] Patient Education Committee, August 2015   Do not wear jewelry, make-up or nail polish.  Do not wear lotions, powders, or perfumes, or deodorant.  Do not shave 48 hours prior to surgery.    Do not bring valuables to the hospital.  [facility] is not responsible for any belongings or valuables.  Contacts, dentures or bridgework may not be worn into  surgery.  Leave your suitcase in the car.  After surgery it may be brought to your room.  For patients admitted to the hospital, discharge time will be determined by your treatment team.  Patients discharged the day of surgery will not be allowed to drive home.   Special instructions:   [facility]- Preparing For Surgery  Before surgery, you can play an important role. Because skin is not sterile, your skin needs to be as free of germs as possible. You can reduce the number of germs on your skin by washing with CHG (chlorahexidine gluconate) Soap before surgery.  CHG is an antiseptic cleaner which kills germs and bonds with the skin to continue killing germs even after washing.  Please do not use if you have an allergy to CHG or antibacterial soaps. If your skin becomes reddened/irritated stop using the CHG.  Do not shave (including legs and underarms) for at least 48 hours prior to first CHG shower. It is OK to shave your face.  Please follow these instructions carefully.   1. Shower the NIGHT BEFORE SURGERY and the MORNING OF SURGERY with CHG.   2. If you chose to wash your hair, wash your hair first as usual with your normal shampoo.  3. After you shampoo, rinse your hair and body thoroughly to remove the shampoo.  4. Use CHG as you would any other liquid soap. You can apply CHG directly to the skin and wash gently with a scrungie or a clean washcloth.   5. Apply the CHG Soap to your body ONLY FROM THE NECK DOWN.  Do not use on open wounds or open sores. Avoid contact with your eyes, ears, mouth and genitals (private parts). Wash Face and genitals (private parts)  with your normal soap.  6. Wash thoroughly, paying special attention to the area where your surgery will be performed.  7. Thoroughly rinse your body with warm water from the neck down.  8. DO NOT shower/wash with your normal soap after using and rinsing off the CHG Soap.  9. Pat yourself dry with a CLEAN  TOWEL.  10. Wear CLEAN PAJAMAS to bed the night before surgery, wear comfortable clothes the morning of surgery  11. Place CLEAN SHEETS on your bed the night of your first shower and DO NOT SLEEP WITH PETS.  Day of Surgery: Do not apply any deodorants/lotions. Please wear clean clothes to the hospital/surgery center.    Please read over the following fact sheets that you were given. Pain Booklet, Coughing and Deep Breathing, MRSA Information and Surgical Site Infection Prevention

## 2017-04-04 NOTE — H&P (Signed)
History and Physical    [name] [name]:[id number] DOB: [birth year] DOA: 04/04/2017  PCP: [name] Pi, MD  Patient coming from: Home.  Chief Complaint: Positive blood cultures.  HPI: [name] [name] is a 62 y.o. female with history of peripheral vascular disease, diabetes mellitus type 2, hyperlipidemia, tobacco abuse, thrombocytopenia, hypertension who was recently admitted and discharged on January 10 for right leg cellulitis and second toe gangrene was instructed to come to the ER after patient's blood cultures drawn during that stay grew Streptococcus variance in both bottles.  Patient was discharged home on Keflex.  Patient denies any fever chills after discharge.  Vascular surgeon Dr. Trula Slade was planning to have patient's carotid surgery this week.  ED Course: In the ER on exam patient still has redness and erythema of the right foot and second toe looks have dry gangrene.  Patient denies any chest pain or shortness of breath nausea vomiting or diarrhea.  Review of Systems: As per HPI, rest all negative.   Past Medical History:  Diagnosis Date  . Abdominal abscess   . Allergic rhinitis   . Asthma   . Carotid stenosis, left 04/2009   50-69%  . Colon polyps 12/05  . COPD (chronic obstructive pulmonary disease) ([facility])   . Depression   . Diabetes mellitus    followed by Dr. Chalmers Cater  . Folate deficiency   . Hyperlipidemia   . Hypertension   . Lumbar disc disease    h/o hnp with repair  . Obesity, morbid (more than 100 lbs over ideal weight or BMI > 40) (HCC)   . Thrombocytopenia ([facility])    work up with Dr. Griffith Citron neg in 2000  . Tobacco abuse     Past Surgical History:  Procedure Laterality Date  . ABDOMINAL AORTOGRAM W/LOWER EXTREMITY N/A 02/28/2017   Procedure: ABDOMINAL AORTOGRAM W/LOWER EXTREMITY;  Surgeon: Serafina Mitchell, MD;  Location: [facility];  Service: Cardiovascular;  Laterality: N/A;  Bilateral  . BACK SURGERY  1989   LS  for HNP  .  CHOLECYSTECTOMY  1982  . HERNIA REPAIR  2001   incisional  . INCISE AND DRAIN ABCESS  01/11/11   abd abscess     reports that she has been smoking cigarettes.  She has a 45.00 pack-year smoking history. she has never used smokeless tobacco. She reports that she does not drink alcohol or use drugs.  Allergies  Allergen Reactions  . Iohexol Rash    Pt states she had a rash 35-40 yrs ago during a procedure.  Benadryl was given and pt was fine in 30 mins. She has had multiple CT's since with premeds and has done fine.  No anaphylaxis per pt. I updated this record. [name], RTRCT  03/06/17  . Penicillins Rash    Has patient had a PCN reaction causing immediate rash, facial/tongue/throat swelling, SOB or lightheadedness with hypotension: Yes Has patient had a PCN reaction causing severe rash involving mucus membranes or skin necrosis: No Has patient had a PCN reaction that required hospitalization: No Has patient had a PCN reaction occurring within the last 10 years: No Can take amoxicillin     . Sulfa Antibiotics Rash    Family History  Problem Relation Age of Onset  . Asthma Mother   . Hypertension Mother   . Stroke Father   . Alcohol abuse Father   . Cancer Sister        brain stem tumor  . Suicidality Sister   .  Cancer Sister        breast  . Suicidality Brother   . Dementia Sister     Prior to Admission medications   Medication Sig Start Date End Date Taking? Authorizing Provider  albuterol (PROVENTIL HFA;VENTOLIN HFA) 108 (90 BASE) MCG/ACT inhaler Inhale 2 puffs into the lungs every 6 (six) hours as needed for wheezing.   Yes [provider]  aspirin 81 MG EC tablet Take 81 mg by mouth daily.     Yes [provider]  atorvastatin (LIPITOR) 20 MG tablet TAKE 1 TABLET DAILY 03/13/13  Yes Schoenhoff, Altamese Cabal, MD  cephALEXin (KEFLEX) 500 MG capsule Take 500 mg by mouth 4 (four) times daily. x7 days 03/31/17  Yes [provider]  Cholecalciferol  (VITAMIN D3) 1000 UNITS CAPS Take 1,000 Units by mouth 2 (two) times daily.    Yes [provider]  CINNAMON PO Take 1,000 mg by mouth 2 (two) times daily.   Yes [provider]  folic acid (FOLVITE) 580 MCG tablet Take 800 mcg by mouth daily.    Yes [provider]  hydrochlorothiazide (HYDRODIURIL) 25 MG tablet TAKE 1 TABLET DAILY   Yes Schoenhoff, Altamese Cabal, MD  HYDROcodone-acetaminophen (NORCO/VICODIN) 5-325 MG tablet Take 1 tablet by mouth every 6 (six) hours as needed for moderate pain. 03/30/17  Yes Mikhail, Clinical biochemist, DO  insulin aspart protamine - aspart (NOVOLOG MIX 70/30 FLEXPEN) (70-30) 100 UNIT/ML FlexPen Inject 60 Units into the skin 2 (two) times daily.   Yes [provider]  lisinopril (PRINIVIL,ZESTRIL) 5 MG tablet Take 5 mg by mouth daily.   Yes [provider]  metFORMIN (GLUCOPHAGE) 1000 MG tablet Take 1,000 mg by mouth 2 (two) times daily with a meal.   Yes [provider]  sertraline (ZOLOFT) 100 MG tablet TAKE 2 TABLETS DAILY Patient taking differently: Take 100 mg by mouth once daily 01/01/14  Yes Schoenhoff, Altamese Cabal, MD    Physical Exam: Vitals:   04/04/17 1800 04/04/17 1845 04/04/17 1930 04/04/17 1945  BP: 139/61 (!) 144/70 (!) 146/65 117/65  Pulse: (!) 114 (!) 111 99 (!) 105  Resp:      Temp:      TempSrc:      SpO2: 93% 94% (!) 87% 93%      Constitutional: Moderately built and nourished. Vitals:   04/04/17 1800 04/04/17 1845 04/04/17 1930 04/04/17 1945  BP: 139/61 (!) 144/70 (!) 146/65 117/65  Pulse: (!) 114 (!) 111 99 (!) 105  Resp:      Temp:      TempSrc:      SpO2: 93% 94% (!) 87% 93%   Eyes: Anicteric no pallor. ENMT: No discharge from the ears eyes nose or mouth. Neck: No mass felt.  No neck rigidity. Respiratory: No rhonchi or crepitations. Cardiovascular: S1-S2 heard no murmurs appreciated. Abdomen: Soft nontender bowel sounds present. Musculoskeletal: Erythema of the right foot with  gangrenous second toe. Skin: Erythema of the right foot and gangrene of second toe of the right foot. Neurologic: Alert awake oriented to time place and person.  Moves all extremities. Psychiatric: Appears normal.  Normal affect.   Labs on Admission: I have personally reviewed following labs and imaging studies  CBC: Recent Labs  Lab 03/29/17 2122 04/04/17 1347  WBC 9.8 8.8  NEUTROABS 6.3 6.2  HGB 15.2* 14.5  HCT 46.0 43.1  MCV 82.7 83.0  PLT 119* 998*   Basic Metabolic Panel: Recent Labs  Lab 03/29/17 2122 04/04/17 1347  NA 132* 130*  K 4.0 4.1  CL 95* 92*  CO2 26 25  GLUCOSE 152* 284*  BUN 6 7  CREATININE 0.49 0.54  CALCIUM 9.4 9.5   GFR: Estimated Creatinine Clearance: 96.2 mL/min (by C-G formula based on SCr of 0.54 mg/dL). Liver Function Tests: Recent Labs  Lab 04/04/17 1347  AST 23  ALT 20  ALKPHOS 105  BILITOT 0.6  PROT 6.3*  ALBUMIN 3.6   No results for input(s): LIPASE, AMYLASE in the last 168 hours. No results for input(s): AMMONIA in the last 168 hours. Coagulation Profile: Recent Labs  Lab 03/30/17 0528  INR 1.06   Cardiac Enzymes: No results for input(s): CKTOTAL, CKMB, CKMBINDEX, TROPONINI in the last 168 hours. BNP (last 3 results) No results for input(s): PROBNP in the last 8760 hours. HbA1C: No results for input(s): HGBA1C in the last 72 hours. CBG: Recent Labs  Lab 03/30/17 0755  GLUCAP 195*   Lipid Profile: No results for input(s): CHOL, HDL, LDLCALC, TRIG, CHOLHDL, LDLDIRECT in the last 72 hours. Thyroid Function Tests: No results for input(s): TSH, T4TOTAL, FREET4, T3FREE, THYROIDAB in the last 72 hours. Anemia Panel: No results for input(s): VITAMINB12, FOLATE, FERRITIN, TIBC, IRON, RETICCTPCT in the last 72 hours. Urine analysis:    Component Value Date/Time   COLORURINE YELLOW 08/09/2007 0200   APPEARANCEUR CLEAR 08/09/2007 0200   LABSPEC 1.005 08/09/2007 0200   PHURINE 7.0 08/09/2007 0200   GLUCOSEU NEGATIVE  08/09/2007 0200   HGBUR NEGATIVE 08/09/2007 0200   BILIRUBINUR neg 01/28/2013 1327   KETONESUR NEGATIVE 08/09/2007 0200   PROTEINUR neg 01/28/2013 1327   PROTEINUR NEGATIVE 08/09/2007 0200   UROBILINOGEN negative 01/28/2013 1327   UROBILINOGEN 1.0 08/09/2007 0200   NITRITE neg 01/28/2013 1327   NITRITE NEGATIVE 08/09/2007 0200   LEUKOCYTESUR Negative 01/28/2013 1327   Sepsis Labs: @LABRCNTIP (procalcitonin:4,lacticidven:4) ) Recent Results (from the past 240 hour(s))  Culture, blood (x 2)     Status: Abnormal   Collection Time: 03/30/17  5:25 AM  Result Value Ref Range Status   Specimen Description BLOOD WRIST RIGHT  Final   Special Requests   Final    BOTTLES DRAWN AEROBIC AND ANAEROBIC Blood Culture adequate volume   Culture  Setup Time   Final    GRAM POSITIVE COCCI AEROBIC BOTTLE ONLY CRITICAL VALUE NOTED.  VALUE IS CONSISTENT WITH PREVIOUSLY REPORTED AND CALLED VALUE.    Culture (A)  Final    VIRIDANS STREPTOCOCCUS SUSCEPTIBILITIES PERFORMED ON PREVIOUS CULTURE WITHIN THE LAST 5 DAYS.    Report Status 04/03/2017 FINAL  Final  Culture, blood (x 2)     Status: Abnormal   Collection Time: 03/30/17  5:32 AM  Result Value Ref Range Status   Specimen Description BLOOD LEFT ANTECUBITAL  Final   Special Requests IN PEDIATRIC BOTTLE Blood Culture adequate volume  Final   Culture  Setup Time   Final    GRAM POSITIVE COCCI IN CHAINS IN PEDIATRIC BOTTLE CRITICAL RESULT CALLED TO, READ BACK BY AND VERIFIED WITH: S. COBLE RN 03/31/17 0035 BEAMJ    Culture VIRIDANS STREPTOCOCCUS (A)  Final   Report Status 04/03/2017 FINAL  Final   Organism ID, Bacteria VIRIDANS STREPTOCOCCUS  Final      Susceptibility   Viridans streptococcus - MIC*    PENICILLIN <=0.06 SENSITIVE Sensitive     CEFTRIAXONE <=0.12 SENSITIVE Sensitive     ERYTHROMYCIN 2 RESISTANT Resistant     LEVOFLOXACIN 1 SENSITIVE Sensitive     VANCOMYCIN 1 SENSITIVE  Sensitive     * VIRIDANS STREPTOCOCCUS  Blood Culture ID  Panel (Reflexed)     Status: Abnormal   Collection Time: 03/30/17  5:32 AM  Result Value Ref Range Status   Enterococcus species NOT DETECTED NOT DETECTED Final   Listeria monocytogenes NOT DETECTED NOT DETECTED Final   Staphylococcus species NOT DETECTED NOT DETECTED Final   Staphylococcus aureus NOT DETECTED NOT DETECTED Final   Streptococcus species DETECTED (A) NOT DETECTED Final    Comment: Not Enterococcus species, Streptococcus agalactiae, Streptococcus pyogenes, or Streptococcus pneumoniae. CRITICAL RESULT CALLED TO, READ BACK BY AND VERIFIED WITH: S. COBLE RN 03/31/17 0035 BEAMJ    Streptococcus agalactiae NOT DETECTED NOT DETECTED Final   Streptococcus pneumoniae NOT DETECTED NOT DETECTED Final   Streptococcus pyogenes NOT DETECTED NOT DETECTED Final   Acinetobacter baumannii NOT DETECTED NOT DETECTED Final   Enterobacteriaceae species NOT DETECTED NOT DETECTED Final   Enterobacter cloacae complex NOT DETECTED NOT DETECTED Final   Escherichia coli NOT DETECTED NOT DETECTED Final   Klebsiella oxytoca NOT DETECTED NOT DETECTED Final   Klebsiella pneumoniae NOT DETECTED NOT DETECTED Final   Proteus species NOT DETECTED NOT DETECTED Final   Serratia marcescens NOT DETECTED NOT DETECTED Final   Haemophilus influenzae NOT DETECTED NOT DETECTED Final   Neisseria meningitidis NOT DETECTED NOT DETECTED Final   Pseudomonas aeruginosa NOT DETECTED NOT DETECTED Final   Candida albicans NOT DETECTED NOT DETECTED Final   Candida glabrata NOT DETECTED NOT DETECTED Final   Candida krusei NOT DETECTED NOT DETECTED Final   Candida parapsilosis NOT DETECTED NOT DETECTED Final   Candida tropicalis NOT DETECTED NOT DETECTED Final     Radiological Exams on Admission: Dg Foot Complete Right  Result Date: 04/04/2017 CLINICAL DATA:  Diabetic foot ulcer for several weeks. Right foot pain. EXAM: RIGHT FOOT COMPLETE - 3+ VIEW COMPARISON:  None. FINDINGS: Osseous alignment is normal. Bone  mineralization is normal. No fracture line or displaced fracture fragment identified. No acute or suspicious osseous lesion. No destructive change to suggest osteomyelitis. Soft tissues about the right foot are unremarkable. IMPRESSION: Negative. Electronically Signed   By: Franki Cabot M.D.   On: 04/04/2017 20:48      Assessment/Plan Principal Problem:   Bacteremia Active Problems:   DM (diabetes mellitus), type 2 with peripheral vascular complications (HCC)   Hypertension   Thrombocytopenia (HCC)   PVD (peripheral vascular disease) (HCC)    1. Streptococcus lesions bacteremia likely from right foot cellulitis -patient has been placed on ceftriaxone as blood cultures show sensitivity to ceftriaxone.  Repeat blood cultures already obtained.  May consult infectious disease in a.m. 2. Diabetes mellitus type 2 last hemoglobin A1c was around 8 patient is on NovoLog 70/3060 units twice daily.  Closely follow CBGs. 3. Hypertension on lisinopril and hydrochlorothiazide. 4. Hyperlipidemia on statins.  5. Chronic thrombocytopenia of unknown etiology.  Follow CBC. 6. Peripheral vascular disease with critical carotid stenosis being followed by Dr. Trula Slade.  Plan was to have carotid endarterectomy later this week.  Patient is on aspirin. 7. COPD presently not wheezing.   DVT prophylaxis: Lovenox. Code Status: Full code. Family Communication: Discussed with patient. Disposition Plan: Home. Consults called: None. Admission status: Inpatient.   Rise Patience MD [facility] Pager [phone].  If 7PM-7AM, please contact night-coverage [email or web address] Password [facility]  04/04/2017, 9:19 PM

## 2017-04-04 NOTE — Telephone Encounter (Signed)
Post ED Visit - Positive Culture Follow-up: Successful Patient Follow-Up  Culture assessed and recommendations reviewed by: [x]  Elenor Quinones, Pharm.D. []  Heide Guile, Pharm.D., BCPS AQ-ID []  Parks Neptune, Pharm.D., BCPS []  Alycia Rossetti, Pharm.D., BCPS []  [place], Pharm.D., BCPS, AAHIVP []  Legrand Como, Pharm.D., BCPS, AAHIVP []  Salome Arnt, PharmD, BCPS []  Dimitri Ped, PharmD, BCPS []  Vincenza Hews, PharmD, BCPS  Positive blood culture  []  Patient discharged without antimicrobial prescription and treatment is now indicated []  Organism is resistant to prescribed ED discharge antimicrobial [x]  Patient with positive blood cultures  Changes discussed with ED provider: Eliezer Mccoy PA Patient verbalizes understanding of need to seek medical assistance today    Hazle Nordmann 04/04/2017, 10:14 AM
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
## 2017-04-04 NOTE — ED Notes (Signed)
Attempted report x 1 to 5N [name]. Will call back in 5 minutes.

## 2017-04-04 NOTE — Progress Notes (Signed)
ED Antimicrobial Stewardship Positive Culture Follow Up   [name] [name] is an 62 y.o. female who presented to [facility] on 03/30/2017 with a chief complaint of  Chief Complaint  Patient presents with  . Diabetic Toe Infection    Recent Results (from the past 720 hour(s))  Culture, blood (x 2)     Status: Abnormal   Collection Time: 03/30/17  5:25 AM  Result Value Ref Range Status   Specimen Description BLOOD WRIST RIGHT  Final   Special Requests   Final    BOTTLES DRAWN AEROBIC AND ANAEROBIC Blood Culture adequate volume   Culture  Setup Time   Final    GRAM POSITIVE COCCI AEROBIC BOTTLE ONLY CRITICAL VALUE NOTED.  VALUE IS CONSISTENT WITH PREVIOUSLY REPORTED AND CALLED VALUE.    Culture (A)  Final    VIRIDANS STREPTOCOCCUS SUSCEPTIBILITIES PERFORMED ON PREVIOUS CULTURE WITHIN THE LAST 5 DAYS.    Report Status 04/03/2017 FINAL  Final  Culture, blood (x 2)     Status: Abnormal   Collection Time: 03/30/17  5:32 AM  Result Value Ref Range Status   Specimen Description BLOOD LEFT ANTECUBITAL  Final   Special Requests IN PEDIATRIC BOTTLE Blood Culture adequate volume  Final   Culture  Setup Time   Final    GRAM POSITIVE COCCI IN CHAINS IN PEDIATRIC BOTTLE CRITICAL RESULT CALLED TO, READ BACK BY AND VERIFIED WITH: S. COBLE RN 03/31/17 0035 BEAMJ    Culture VIRIDANS STREPTOCOCCUS (A)  Final   Report Status 04/03/2017 FINAL  Final   Organism ID, Bacteria VIRIDANS STREPTOCOCCUS  Final      Susceptibility   Viridans streptococcus - MIC*    ERYTHROMYCIN 2 RESISTANT Resistant     LEVOFLOXACIN 1 SENSITIVE Sensitive     VANCOMYCIN 1 SENSITIVE Sensitive     * VIRIDANS STREPTOCOCCUS  Blood Culture ID Panel (Reflexed)     Status: Abnormal   Collection Time: 03/30/17  5:32 AM  Result Value Ref Range Status   Enterococcus species NOT DETECTED NOT DETECTED Final   Listeria monocytogenes NOT DETECTED NOT DETECTED Final   Staphylococcus species NOT DETECTED NOT DETECTED Final   Staphylococcus aureus NOT DETECTED NOT DETECTED Final   Streptococcus species DETECTED (A) NOT DETECTED Final    Comment: Not Enterococcus species, Streptococcus agalactiae, Streptococcus pyogenes, or Streptococcus pneumoniae. CRITICAL RESULT CALLED TO, READ BACK BY AND VERIFIED WITH: S. COBLE RN 03/31/17 0035 BEAMJ    Streptococcus agalactiae NOT DETECTED NOT DETECTED Final   Streptococcus pneumoniae NOT DETECTED NOT DETECTED Final   Streptococcus pyogenes NOT DETECTED NOT DETECTED Final   Acinetobacter baumannii NOT DETECTED NOT DETECTED Final   Enterobacteriaceae species NOT DETECTED NOT DETECTED Final   Enterobacter cloacae complex NOT DETECTED NOT DETECTED Final   Escherichia coli NOT DETECTED NOT DETECTED Final   Klebsiella oxytoca NOT DETECTED NOT DETECTED Final   Klebsiella pneumoniae NOT DETECTED NOT DETECTED Final   Proteus species NOT DETECTED NOT DETECTED Final   Serratia marcescens NOT DETECTED NOT DETECTED Final   Haemophilus influenzae NOT DETECTED NOT DETECTED Final   Neisseria meningitidis NOT DETECTED NOT DETECTED Final   Pseudomonas aeruginosa NOT DETECTED NOT DETECTED Final   Candida albicans NOT DETECTED NOT DETECTED Final   Candida glabrata NOT DETECTED NOT DETECTED Final   Candida krusei NOT DETECTED NOT DETECTED Final   Candida parapsilosis NOT DETECTED NOT DETECTED Final   Candida tropicalis NOT DETECTED NOT DETECTED Final   2/2 blood cx with strep viridans from  two different sites. Patient will need to be called and told to come back to the hospital to be admitted for possible blood infection.  ED Provider: Eliezer Mccoy PA-C   Reginia Naas 04/04/2017, 9:30 AM Infectious Diseases Pharmacist Phone# [phone]

## 2017-04-04 NOTE — ED Notes (Signed)
Patient transported to X-ray 

## 2017-04-04 NOTE — Progress Notes (Signed)
Pharmacy Antibiotic Note  [name] [name] is a 62 y.o. female admitted on 04/04/2017 with diabetic R toe wound infection.  Pharmacy has been consulted for cefepime dosing. Pt is afebrile with tachycardia at 113. Elevated lactate at 1.98. Scr and WBC WNL.Pt reports taking PO cephalexin outpatient for wound infection.  Plan: Cefepime 2gm IV q8h F/u renal fxn, clinical resolution, deescalation plans  Temp (24hrs), Avg:97.8 F (36.6 C), Min:97.5 F (36.4 C), Max:98.1 F (36.7 C)  Recent Labs  Lab 03/29/17 2122 03/30/17 0528 03/30/17 0759 04/04/17 1347 04/04/17 1401 04/04/17 1852  WBC 9.8  --   --  8.8  --   --   CREATININE 0.49  --   --  0.54  --   --   LATICACIDVEN  --  1.2 0.8  --  1.98* 0.65    Estimated Creatinine Clearance: 96.2 mL/min (by C-G formula based on SCr of 0.54 mg/dL).    Allergies  Allergen Reactions  . Iohexol Rash    Pt states she had a rash 35-40 yrs ago during a procedure.  Benadryl was given and pt was fine in 30 mins. She has had multiple CT's since with premeds and has done fine.  No anaphylaxis per pt. I updated this record. [name], RTRCT  03/06/17  . Penicillins Rash    Has patient had a PCN reaction causing immediate rash, facial/tongue/throat swelling, SOB or lightheadedness with hypotension: Yes Has patient had a PCN reaction causing severe rash involving mucus membranes or skin necrosis: No Has patient had a PCN reaction that required hospitalization: No Has patient had a PCN reaction occurring within the last 10 years: No Can take amoxicillin     . Sulfa Antibiotics Rash    Antimicrobials this admission: Cefepime 1/15 >>  Microbiology results: 1/10 BCx: Strep viridans  Thank you for allowing pharmacy to be a part of this patient's care.  Akaash Vandewater 04/04/2017 7:46 PM

## 2017-04-05 ENCOUNTER — Inpatient Hospital Stay (HOSPITAL_COMMUNITY)
Admission: RE | Admit: 2017-04-05 | Discharge: 2017-04-05 | Disposition: A | Discharge status: Home / Self Care | Payer: BLUE CROSS/BLUE SHIELD | Source: Ambulatory Visit

## 2017-04-05 ENCOUNTER — Encounter (HOSPITAL_COMMUNITY): Payer: Self-pay | Admitting: General Practice

## 2017-04-05 ENCOUNTER — Other Ambulatory Visit: Payer: Self-pay

## 2017-04-05 DIAGNOSIS — I96 Gangrene, not elsewhere classified: Secondary | ICD-10-CM

## 2017-04-05 DIAGNOSIS — Z880 Allergy status to penicillin: Secondary | ICD-10-CM

## 2017-04-05 DIAGNOSIS — I10 Essential (primary) hypertension: Secondary | ICD-10-CM

## 2017-04-05 DIAGNOSIS — E1152 Type 2 diabetes mellitus with diabetic peripheral angiopathy with gangrene: Principal | ICD-10-CM

## 2017-04-05 DIAGNOSIS — Z91041 Radiographic dye allergy status: Secondary | ICD-10-CM

## 2017-04-05 DIAGNOSIS — R7881 Bacteremia: Secondary | ICD-10-CM

## 2017-04-05 DIAGNOSIS — F17210 Nicotine dependence, cigarettes, uncomplicated: Secondary | ICD-10-CM

## 2017-04-05 DIAGNOSIS — L089 Local infection of the skin and subcutaneous tissue, unspecified: Secondary | ICD-10-CM

## 2017-04-05 DIAGNOSIS — Z882 Allergy status to sulfonamides status: Secondary | ICD-10-CM

## 2017-04-05 DIAGNOSIS — E11628 Type 2 diabetes mellitus with other skin complications: Secondary | ICD-10-CM

## 2017-04-05 LAB — CBC
HCT: 43.3 % (ref 36.0–46.0)
HEMOGLOBIN: 14.2 g/dL (ref 12.0–15.0)
MCH: 27.5 pg (ref 26.0–34.0)
MCHC: 32.8 g/dL (ref 30.0–36.0)
MCV: 83.8 fL (ref 78.0–100.0)
Platelets: 128 10*3/uL — ABNORMAL LOW (ref 150–400)
RBC: 5.17 MIL/uL — ABNORMAL HIGH (ref 3.87–5.11)
RDW: 14.9 % (ref 11.5–15.5)
WBC: 7.8 10*3/uL (ref 4.0–10.5)

## 2017-04-05 LAB — GLUCOSE, CAPILLARY
GLUCOSE-CAPILLARY: 102 mg/dL — AB (ref 65–99)
GLUCOSE-CAPILLARY: 99 mg/dL (ref 65–99)
Glucose-Capillary: 70 mg/dL (ref 65–99)
Glucose-Capillary: 128 mg/dL — ABNORMAL HIGH (ref 65–99)

## 2017-04-05 LAB — BASIC METABOLIC PANEL
ANION GAP: 12 (ref 5–15)
BUN: 5 mg/dL — AB (ref 6–20)
CALCIUM: 9.5 mg/dL (ref 8.9–10.3)
CO2: 26 mmol/L (ref 22–32)
CREATININE: 0.46 mg/dL (ref 0.44–1.00)
Chloride: 96 mmol/L — ABNORMAL LOW (ref 101–111)
GFR calc Af Amer: >60 mL/min (ref >60)
GLUCOSE: 106 mg/dL — AB (ref 65–99)
Potassium: 3.9 mmol/L (ref 3.5–5.1)
Sodium: 134 mmol/L — ABNORMAL LOW (ref 135–145)

## 2017-04-05 LAB — URINALYSIS, ROUTINE W REFLEX MICROSCOPIC
BILIRUBIN URINE: NEGATIVE
GLUCOSE, UA: NEGATIVE mg/dL
HGB URINE DIPSTICK: NEGATIVE
Ketones, ur: NEGATIVE mg/dL
Leukocytes, UA: NEGATIVE
Nitrite: NEGATIVE
Protein, ur: NEGATIVE mg/dL
SPECIFIC GRAVITY, URINE: 1.008 (ref 1.005–1.030)
pH: 7 (ref 5.0–8.0)

## 2017-04-05 MED ORDER — OXYCODONE-ACETAMINOPHEN 5-325 MG PO TABS
1.0000 | ORAL_TABLET | Freq: Four times a day (QID) | ORAL | Status: DC | PRN
  Administered 2017-04-05 – 2017-04-10 (×15): 2 via ORAL
  Filled 2017-04-05: qty 1
  Filled 2017-04-05 (×5): qty 2
  Filled 2017-04-05: qty 1
  Filled 2017-04-05 (×11): qty 2

## 2017-04-05 MED ORDER — INSULIN ASPART PROT & ASPART (70-30 MIX) 100 UNIT/ML ~~LOC~~ SUSP
60.0000 [IU] | Freq: Two times a day (BID) | SUBCUTANEOUS | Status: DC
  Administered 2017-04-06: 60 [IU] via SUBCUTANEOUS
  Filled 2017-04-05: qty 10

## 2017-04-05 MED ORDER — HEPARIN SODIUM (PORCINE) 5000 UNIT/ML IJ SOLN
5000.0000 [IU] | Freq: Three times a day (TID) | INTRAMUSCULAR | Status: DC
  Administered 2017-04-05 – 2017-04-06 (×4): 5000 [IU] via SUBCUTANEOUS
  Filled 2017-04-05 (×2): qty 1

## 2017-04-05 MED ORDER — PNEUMOCOCCAL VAC POLYVALENT 25 MCG/0.5ML IJ INJ: 0.5000 mL | INJECTION | INTRAMUSCULAR | Status: DC

## 2017-04-05 MED ORDER — OXYCODONE-ACETAMINOPHEN 5-325 MG PO TABS
2.0000 | ORAL_TABLET | Freq: Once | ORAL | Status: AC
  Administered 2017-04-05: 2 via ORAL
  Filled 2017-04-05: qty 2

## 2017-04-05 NOTE — Consult Note (Signed)
[facility]       Reason for Consult: bacteremia    Referring Physician: Dr. Tana Coast  Principal Problem:   Bacteremia Active Problems:   DM (diabetes mellitus), type 2 with peripheral vascular complications (HCC)   Hypertension   Thrombocytopenia (HCC)   PVD (peripheral vascular disease) ([place])   . aspirin EC  81 mg Oral Daily  . atorvastatin  20 mg Oral Daily  . folic acid  1 mg Oral Daily  . heparin injection (subcutaneous)  5,000 Units Subcutaneous Q8H  . hydrochlorothiazide  25 mg Oral Daily  . insulin aspart  0-9 Units Subcutaneous TID WC  . insulin aspart protamine- aspart  60 Units Subcutaneous BID  . lisinopril  5 mg Oral Daily  . [START ON 04/06/2017] pneumococcal 23 valent vaccine  0.5 mL Intramuscular Tomorrow-1000  . sertraline  100 mg Oral Daily    Recommendations: Continue Keflex 5 days to assure clearance prior to procedure If repeat blood cultures again positive (not likely), would pursue TEE  Thanks for consult.  Please call with any changes or concerns.   Assessment: She has 2/2 blood cultures with Strep viridans of unknown signficance.  They were drawn during a brief admission 1/10 when she came in with pain in her foot.  Not associated with fever, chills or other concerns.   Dry gangrene - no signs of infection, no surrounding erythema  Antibiotics: Keflex since 1/10, ceftriaxone day 2  HPI: [name] [name] is a 62 y.o. female with PVD and dry gangrene of foot came in 1/10 as above.  Blood cultures as above.  She has had no fever, no chills, no sob, no mouth lesions.  Is coughing a lot since quitting smoking.  No doe.  No travel. No sick contacts.  No associated rash.     Review of Systems:  Constitutional: negative for fevers, chills and anorexia Gastrointestinal: negative for nausea and diarrhea Integument/breast: negative for rash All other systems reviewed and are negative    Past Medical History:  Diagnosis Date    . Abdominal abscess   . Allergic rhinitis   . Asthma   . Carotid stenosis, left 04/2009   50-69%  . Colon polyps 12/05  . COPD (chronic obstructive pulmonary disease) ([place])   . Depression   . Diabetes mellitus    followed by Dr. Chalmers Cater  . Folate deficiency   . Hyperlipidemia   . Hypertension   . Lumbar disc disease    h/o hnp with repair  . Obesity, morbid (more than 100 lbs over ideal weight or BMI > 40) (HCC)   . Thrombocytopenia ([place])    work up with Dr. Griffith Citron neg in 2000  . Tobacco abuse     Social History   Tobacco Use  . Smoking status: Current Every Day Smoker    Packs/day: 1.00    Years: 45.00    Pack years: 45.00    Types: Cigarettes  . Smokeless tobacco: Never Used  Substance Use Topics  . Alcohol use: No  . Drug use: No    Family History  Problem Relation Age of Onset  . Asthma Mother   . Hypertension Mother   . Stroke Father   . Alcohol abuse Father   . Cancer Sister        brain stem tumor  . Suicidality Sister   . Cancer Sister        breast  . Suicidality Brother   . Dementia Sister  Allergies  Allergen Reactions  . Iohexol Rash    Pt states she had a rash 35-40 yrs ago during a procedure.  Benadryl was given and pt was fine in 30 mins. She has had multiple CT's since with premeds and has done fine.  No anaphylaxis per pt. I updated this record. [name], RTRCT  03/06/17  . Penicillins Rash    Has patient had a PCN reaction causing immediate rash, facial/tongue/throat swelling, SOB or lightheadedness with hypotension: Yes Has patient had a PCN reaction causing severe rash involving mucus membranes or skin necrosis: No Has patient had a PCN reaction that required hospitalization: No Has patient had a PCN reaction occurring within the last 10 years: No Can take amoxicillin     . Sulfa Antibiotics Rash    Physical Exam: Constitutional: in no apparent distress and alert  Vitals:   04/05/17 0425 04/05/17 1308  BP: (!) 160/75  125/64  Pulse: 91 92  Resp: 18 17  Temp: 97.7 F (36.5 C) 98 F (36.7 C)  SpO2: 90% 95%   EYES: anicteric ENMT: no thrush Cardiovascular: Cor RRR Respiratory: CTA B; normal respiratory effort GI: Bowel sounds are normal, liver is not enlarged, spleen is not enlarged Musculoskeletal: no pedal edema noted Skin: negatives: no rash Hematologic: no cervical lad  Lab Results  Component Value Date   WBC 7.8 04/05/2017   HGB 14.2 04/05/2017   HCT 43.3 04/05/2017   MCV 83.8 04/05/2017   PLT 128 (L) 04/05/2017    Lab Results  Component Value Date   CREATININE 0.46 04/05/2017   BUN 5 (L) 04/05/2017   NA 134 (L) 04/05/2017   K 3.9 04/05/2017   CL 96 (L) 04/05/2017   CO2 26 04/05/2017    Lab Results  Component Value Date   ALT 20 04/04/2017   AST 23 04/04/2017   ALKPHOS 105 04/04/2017     Microbiology: Recent Results (from the past 240 hour(s))  Culture, blood (x 2)     Status: Abnormal   Collection Time: 03/30/17  5:25 AM  Result Value Ref Range Status   Specimen Description BLOOD WRIST RIGHT  Final   Special Requests   Final    BOTTLES DRAWN AEROBIC AND ANAEROBIC Blood Culture adequate volume   Culture  Setup Time   Final    GRAM POSITIVE COCCI AEROBIC BOTTLE ONLY CRITICAL VALUE NOTED.  VALUE IS CONSISTENT WITH PREVIOUSLY REPORTED AND CALLED VALUE.    Culture (A)  Final    VIRIDANS STREPTOCOCCUS SUSCEPTIBILITIES PERFORMED ON PREVIOUS CULTURE WITHIN THE LAST 5 DAYS.    Report Status 04/03/2017 FINAL  Final  Culture, blood (x 2)     Status: Abnormal   Collection Time: 03/30/17  5:32 AM  Result Value Ref Range Status   Specimen Description BLOOD LEFT ANTECUBITAL  Final   Special Requests IN PEDIATRIC BOTTLE Blood Culture adequate volume  Final   Culture  Setup Time   Final    GRAM POSITIVE COCCI IN CHAINS IN PEDIATRIC BOTTLE CRITICAL RESULT CALLED TO, READ BACK BY AND VERIFIED WITH: S. COBLE RN 03/31/17 0035 BEAMJ    Culture VIRIDANS STREPTOCOCCUS (A)  Final    Report Status 04/03/2017 FINAL  Final   Organism ID, Bacteria VIRIDANS STREPTOCOCCUS  Final      Susceptibility   Viridans streptococcus - MIC*    PENICILLIN <=0.06 SENSITIVE Sensitive     CEFTRIAXONE <=0.12 SENSITIVE Sensitive     ERYTHROMYCIN 2 RESISTANT Resistant     LEVOFLOXACIN  1 SENSITIVE Sensitive     VANCOMYCIN 1 SENSITIVE Sensitive     * VIRIDANS STREPTOCOCCUS  Blood Culture ID Panel (Reflexed)     Status: Abnormal   Collection Time: 03/30/17  5:32 AM  Result Value Ref Range Status   Enterococcus species NOT DETECTED NOT DETECTED Final   Listeria monocytogenes NOT DETECTED NOT DETECTED Final   Staphylococcus species NOT DETECTED NOT DETECTED Final   Staphylococcus aureus NOT DETECTED NOT DETECTED Final   Streptococcus species DETECTED (A) NOT DETECTED Final    Comment: Not Enterococcus species, Streptococcus agalactiae, Streptococcus pyogenes, or Streptococcus pneumoniae. CRITICAL RESULT CALLED TO, READ BACK BY AND VERIFIED WITH: S. COBLE RN 03/31/17 0035 BEAMJ    Streptococcus agalactiae NOT DETECTED NOT DETECTED Final   Streptococcus pneumoniae NOT DETECTED NOT DETECTED Final   Streptococcus pyogenes NOT DETECTED NOT DETECTED Final   Acinetobacter baumannii NOT DETECTED NOT DETECTED Final   Enterobacteriaceae species NOT DETECTED NOT DETECTED Final   Enterobacter cloacae complex NOT DETECTED NOT DETECTED Final   Escherichia coli NOT DETECTED NOT DETECTED Final   Klebsiella oxytoca NOT DETECTED NOT DETECTED Final   Klebsiella pneumoniae NOT DETECTED NOT DETECTED Final   Proteus species NOT DETECTED NOT DETECTED Final   Serratia marcescens NOT DETECTED NOT DETECTED Final   Haemophilus influenzae NOT DETECTED NOT DETECTED Final   Neisseria meningitidis NOT DETECTED NOT DETECTED Final   Pseudomonas aeruginosa NOT DETECTED NOT DETECTED Final   Candida albicans NOT DETECTED NOT DETECTED Final   Candida glabrata NOT DETECTED NOT DETECTED Final   Candida krusei NOT DETECTED  NOT DETECTED Final   Candida parapsilosis NOT DETECTED NOT DETECTED Final   Candida tropicalis NOT DETECTED NOT DETECTED Final    Thayer Headings, [facility] [email or web address] [phone] pager  [phone] cell 04/05/2017, 4:14 PM

## 2017-04-05 NOTE — Progress Notes (Signed)
[facility]           HPI:Dr. Trula Slade was originally consult on [name] [name] during her admission 03/30/2017 secondary to ischemic changes on her right foot.   She was being followed and worked up for right fem-pop bypass.  During her work up she was noted to have critical stenosis in her left ICA.  Dr. Trula Slade had scheduled her for left CEA on 04/07/2017.  Today it was brought to his attention that she is being admitted for bacteremia via positive blood cultures.   Blood cultures drawn during that stay grew Streptococcus variance in both bottles.  Based on sensitives she was started on IV ceftriaxone.        Past medical history includes DM, HTN, hyperlipidemia, tobacco abuse( she has stopped smoking since her last stay in the hospital), and thrombocytopenia.        Objective (!) 160/75 91 97.7 F (36.5 C) (Oral) 18 90%  Intake/Output Summary (Last 24 hours) at 04/05/2017 1253 Last data filed at 04/05/2017 1015 Gross per 24 hour  Intake 1650 ml  Output 800 ml  Net 850 ml    Right GT, second toe and third tip with dry gangrene. Right forefoot cellulitic  changes with tenderness to touch.  Active range of motion intact. Heart RRR Lungs non labored breathing, chronic non productive cough   Assessment/Planning: critical stenosis in her left ICA.  status post aortogram with bilateral lower extremity runoff by Dr. Trula Slade on 02/28/17 which demonstrated high-grade calcific stenotic lesions in the right common femoral artery as well as SFA occlusion with reconstitution in the popliteal artery and three-vessel runoff to the ankle in addition to an occluded left SFA.   Due to bacteremia we will post pone her left CEA until the infection has been cleared.  We do not want to patch angioplasty CEA with active infection.      Roxy Horseman 04/05/2017 12:53 PM --  Laboratory Lab Results: Recent Labs    04/04/17 1347 04/05/17 0456  WBC 8.8 7.8   HGB 14.5 14.2  HCT 43.1 43.3  PLT 147* 128*   BMET Recent Labs    04/04/17 1347 04/05/17 0456  NA 130* 134*  K 4.1 3.9  CL 92* 96*  CO2 25 26  GLUCOSE 284* 106*  BUN 7 5*  CREATININE 0.54 0.46  CALCIUM 9.5 9.5    COAG Lab Results  Component Value Date   INR 1.06 03/30/2017   No results found for: PTT

## 2017-04-05 NOTE — Progress Notes (Signed)
[facility]                                                                              Patient Demographics  [name] [name], is a 62 y.o. female, DOB - 04/08/1955, ZES:[id number]  Admit date - 04/04/2017   Admitting Physician Rise Patience, MD  Outpatient Primary MD for the patient is Jacelyn Pi, MD  Outpatient specialists:   LOS - 1  days   Medical records reviewed and are as summarized below:    Chief Complaint  Patient presents with  . Abnormal Lab       Brief summary    [name] [name] is a 62 y.o. female with history of peripheral vascular disease, diabetes mellitus type 2, hyperlipidemia, tobacco abuse, thrombocytopenia, hypertension who was recently admitted and discharged on January 10 for right leg cellulitis and second toe gangrene was instructed to come to the ER after patient's blood cultures drawn during that stay grew Streptococcus variance in both bottles.  Patient was discharged home on Keflex.  Patient denied any fever chills after discharge.  Vascular surgeon Dr. Trula Slade was planning to have patient's carotid surgery this week.  Assessment & Plan    Principal Problem:   Bacteremia in the setting of gangrenous second right toe and right foot cellulitis -X-ray negative for osteomyelitis -Obtain MRI of the right foot -Blood cultures positive for Streptococcus meridians 2/2, 03/30/17, repeat blood cultures ordered -Continue IV Rocephin, ID consult obtained, discussed with Dr. Linus Salmons -Patient had abdominal aortogram with lower extremity on 02/28/17 due to bilateral leg pain with ulcers, which had shown no significant aortoiliac occlusive disease, high-grade calcific stenotic lesions in the right common femoral artery with superficial femoral artery occlusion, occluded left superficial femoral artery with reconstitution of above-knee popliteal artery.  Was recommended bypass by vascular surgery. -Vascular surgery, Dr. Trula Slade  following -Change Lovenox to heparin subcu in case patient needs surgery  Active Problems:   DM (diabetes mellitus), type 2 with peripheral vascular complications (HCC) -Obtain hemoglobin A1c -Continue NovoLog 70/30, 60 units twice a day monitor CBGs closely    Hypertension -Currently stable, continue lisinopril, HCTZ, monitor creatinine  Chronic thrombocytopenia (HCC) -Follow closely    PVD (peripheral vascular disease) ([place]), critical carotid stenosis -Vascular surgery following   Code Status: Full CODE STATUS DVT Prophylaxis: Heparin subcu Family Communication: Discussed in detail with the patient, all imaging results, lab results explained to the patient   Disposition Plan:   Time Spent in minutes  35 minutes  Procedures:  X-ray right foot  Consultants:   ID Vascular surgery  Antimicrobials:   IV Rocephin 1/15   Medications  Scheduled Meds: . aspirin EC  81 mg Oral Daily  . atorvastatin  20 mg Oral Daily  . folic acid  1 mg Oral Daily  . heparin injection (subcutaneous)  5,000 Units Subcutaneous Q8H  . hydrochlorothiazide  25 mg Oral Daily  . insulin aspart  0-9 Units Subcutaneous TID WC  . insulin aspart protamine- aspart  60 Units Subcutaneous BID  . lisinopril  5 mg Oral Daily  . [START ON 04/06/2017] pneumococcal 23 valent vaccine  0.5 mL  Intramuscular Tomorrow-1000  . sertraline  100 mg Oral Daily   Continuous Infusions: . cefTRIAXone (ROCEPHIN)  IV Stopped (04/05/17 0356)   PRN Meds:.acetaminophen **OR** acetaminophen, albuterol, ondansetron **OR** ondansetron (ZOFRAN) IV, oxyCODONE-acetaminophen   Antibiotics   Anti-infectives (From admission, onward)   Start     Dose/Rate Route Frequency Ordered Stop   04/05/17 0400  cefTRIAXone (ROCEPHIN) 2 g in dextrose 5 % 50 mL IVPB     2 g 100 mL/hr over 30 Minutes Intravenous Every 24 hours 04/04/17 2111     04/04/17 2000  ceFEPIme (MAXIPIME) 2 g in dextrose 5 % 50 mL IVPB  Status:  Discontinued     2  g 100 mL/hr over 30 Minutes Intravenous Every 8 hours 04/04/17 1959 04/04/17 2111        Subjective:   [name] was seen and examined today.  Denies any significant pain in the right foot although looks red and erythema, no change today. Patient denies dizziness, chest pain, shortness of breath, abdominal pain, N/V/D/C, new weakness, numbess, tingling. No acute events overnight.    Objective:   Vitals:   04/05/17 0037 04/05/17 0425 04/05/17 0523 04/05/17 1308  BP: (!) 147/59 (!) 160/75  125/64  Pulse:  91  92  Resp:  18  17  Temp:  97.7 F (36.5 C)  98 F (36.7 C)  TempSrc:  Oral  Oral  SpO2:  90%  95%  Weight:   116.4 kg (256 lb 9.9 oz)   Height:   5\' 7"  (1.702 m)     Intake/Output Summary (Last 24 hours) at 04/05/2017 1316 Last data filed at 04/05/2017 1309 Gross per 24 hour  Intake 1886 ml  Output 800 ml  Net 1086 ml     Wt Readings from Last 3 Encounters:  04/05/17 116.4 kg (256 lb 9.9 oz)  03/29/17 119.3 kg (263 lb)  03/13/17 119.3 kg (263 lb)     Exam  General: Alert and oriented x 3, NAD  Eyes:  HEENT:  Atraumatic, normocephalic  Cardiovascular: S1 S2 auscultated, no rubs, murmurs or gallops. Regular rate and rhythm.  Respiratory: Clear to auscultation bilaterally, no wheezing, rales or rhonchi  Gastrointestinal: Soft, nontender, nondistended, + bowel sounds  Ext: no pedal edema bilaterally  Neuro: AAOx3, Cr N's II- XII. Strength 5/5 upper and lower extremities bilaterally, speech clear, sensations grossly intact  Musculoskeletal: No digital cyanosis, clubbing  Skin: Right foot cellulitic changes with erythema, gangrene of the second toe  Psych: Normal affect and demeanor, alert and oriented x3    Data Reviewed:  I have personally reviewed following labs and imaging studies  Micro Results Recent Results (from the past 240 hour(s))  Culture, blood (x 2)     Status: Abnormal   Collection Time: 03/30/17  5:25 AM  Result Value Ref Range  Status   Specimen Description BLOOD WRIST RIGHT  Final   Special Requests   Final    BOTTLES DRAWN AEROBIC AND ANAEROBIC Blood Culture adequate volume   Culture  Setup Time   Final    GRAM POSITIVE COCCI AEROBIC BOTTLE ONLY CRITICAL VALUE NOTED.  VALUE IS CONSISTENT WITH PREVIOUSLY REPORTED AND CALLED VALUE.    Culture (A)  Final    VIRIDANS STREPTOCOCCUS SUSCEPTIBILITIES PERFORMED ON PREVIOUS CULTURE WITHIN THE LAST 5 DAYS.    Report Status 04/03/2017 FINAL  Final  Culture, blood (x 2)     Status: Abnormal   Collection Time: 03/30/17  5:32 AM  Result Value Ref Range  Status   Specimen Description BLOOD LEFT ANTECUBITAL  Final   Special Requests IN PEDIATRIC BOTTLE Blood Culture adequate volume  Final   Culture  Setup Time   Final    GRAM POSITIVE COCCI IN CHAINS IN PEDIATRIC BOTTLE CRITICAL RESULT CALLED TO, READ BACK BY AND VERIFIED WITH: S. COBLE RN 03/31/17 0035 BEAMJ    Culture VIRIDANS STREPTOCOCCUS (A)  Final   Report Status 04/03/2017 FINAL  Final   Organism ID, Bacteria VIRIDANS STREPTOCOCCUS  Final      Susceptibility   Viridans streptococcus - MIC*    PENICILLIN <=0.06 SENSITIVE Sensitive     CEFTRIAXONE <=0.12 SENSITIVE Sensitive     ERYTHROMYCIN 2 RESISTANT Resistant     LEVOFLOXACIN 1 SENSITIVE Sensitive     VANCOMYCIN 1 SENSITIVE Sensitive     * VIRIDANS STREPTOCOCCUS  Blood Culture ID Panel (Reflexed)     Status: Abnormal   Collection Time: 03/30/17  5:32 AM  Result Value Ref Range Status   Enterococcus species NOT DETECTED NOT DETECTED Final   Listeria monocytogenes NOT DETECTED NOT DETECTED Final   Staphylococcus species NOT DETECTED NOT DETECTED Final   Staphylococcus aureus NOT DETECTED NOT DETECTED Final   Streptococcus species DETECTED (A) NOT DETECTED Final    Comment: Not Enterococcus species, Streptococcus agalactiae, Streptococcus pyogenes, or Streptococcus pneumoniae. CRITICAL RESULT CALLED TO, READ BACK BY AND VERIFIED WITH: S. COBLE RN 03/31/17  0035 BEAMJ    Streptococcus agalactiae NOT DETECTED NOT DETECTED Final   Streptococcus pneumoniae NOT DETECTED NOT DETECTED Final   Streptococcus pyogenes NOT DETECTED NOT DETECTED Final   Acinetobacter baumannii NOT DETECTED NOT DETECTED Final   Enterobacteriaceae species NOT DETECTED NOT DETECTED Final   Enterobacter cloacae complex NOT DETECTED NOT DETECTED Final   Escherichia coli NOT DETECTED NOT DETECTED Final   Klebsiella oxytoca NOT DETECTED NOT DETECTED Final   Klebsiella pneumoniae NOT DETECTED NOT DETECTED Final   Proteus species NOT DETECTED NOT DETECTED Final   Serratia marcescens NOT DETECTED NOT DETECTED Final   Haemophilus influenzae NOT DETECTED NOT DETECTED Final   Neisseria meningitidis NOT DETECTED NOT DETECTED Final   Pseudomonas aeruginosa NOT DETECTED NOT DETECTED Final   Candida albicans NOT DETECTED NOT DETECTED Final   Candida glabrata NOT DETECTED NOT DETECTED Final   Candida krusei NOT DETECTED NOT DETECTED Final   Candida parapsilosis NOT DETECTED NOT DETECTED Final   Candida tropicalis NOT DETECTED NOT DETECTED Final    Radiology Reports Ct Angio Head W Or Wo Contrast  Result Date: 03/20/2017 CLINICAL DATA:  62 year old female with evidence of left carotid short segment occlusion or near occlusion, and high-grade right ICA stenosis on carotid Doppler ultrasound 03/08/2017. Subsequent encounter. EXAM: CT ANGIOGRAPHY HEAD AND NECK TECHNIQUE: Multidetector CT imaging of the head and neck was performed using the standard protocol during bolus administration of intravenous contrast. Multiplanar CT image reconstructions and MIPs were obtained to evaluate the vascular anatomy. Carotid stenosis measurements (when applicable) are obtained utilizing NASCET criteria, using the distal internal carotid diameter as the denominator. CONTRAST:  89mL ISOVUE-370 IOPAMIDOL (ISOVUE-370) INJECTION 76% COMPARISON:  Report of carotid Doppler ultrasound 03/08/2017. Head CT and  cervical spine CT without contrast 06/05/2012. FINDINGS: CT HEAD Brain: Cerebral volume is stable since 2014 and normal for age. Mild for age scattered cerebral white matter hypodensity. No midline shift, ventriculomegaly, mass effect, evidence of mass lesion, intracranial hemorrhage or evidence of cortically based acute infarction. No cortical encephalomalacia identified. Calvarium and skull base: Negative; mild motion artifact  at the skullbase. Paranasal sinuses: Clear. Orbits: Visualized orbits and scalp soft tissues are within normal limits. CTA NECK Skeleton: Absent dentition aside from impacted left maxillary wisdom tooth. No acute osseous abnormality identified. Upper chest: Centrilobular emphysema suspected. Otherwise negative visible lung parenchyma. No superior mediastinal lymphadenopathy. Other neck: Subcentimeter hypodense right anterior thyroid lobe nodule on series 5, image 84 does not meet consensus criteria for ultrasound follow-up. The glottis is closed and there is intermittent motion artifact in the pharynx. No neck mass or lymphadenopathy identified. Aortic arch: Calcified aortic atherosclerosis. Four vessel arch configuration, the left vertebral artery appears to arise directly from the arch (series 5, image 108). Right carotid system: No brachiocephalic artery or right CCA origin stenosis despite calcified plaque. The right CCA is then negative until the right carotid bifurcation where bulky calcified plaque occurs affecting both the right ICA and ECA origins. High-grade right ICA origin stenosis results, numerically estimated at 75 % with respect to the distal vessel , and might be higher owing to the complex configuration of what appears to be both calcified and soft plaque on series 13, image 175. The cervical right ICA remains patent and has a normal caliber distally. Left carotid system: No left CCA origin stenosis despite calcified plaque. There is some circumferential soft plaque in the  left CCA proximal to the bifurcation without stenosis. Bulky calcified plaque at the left carotid bifurcation continues into the left ICA origin and bulb with what appears essentially as a radiographic string sign stenosis over a 13-14 mm segment of the vessel (series 12, image 138. The downstream left ICA caliber is diminished to about 3 mm diameter, but no bona fide occlusion of the vessel is identified. Vertebral arteries: No proximal right subclavian artery stenosis despite calcified plaque. Calcified plaque at the right vertebral artery origin with moderate to severe stenosis (series 11, image 157). The right vertebral artery is dominant. There is intermittent right V 2 segment calcified plaque with only mild associated stenosis. Similar mild right V3 segment calcified plaque without stenosis. The left vertebral artery is non dominant, diminutive, and arises directly from the arch. The left vertebral artery is only 1-2 mm diameter throughout its course but does remain patent into the posterior fossa. CTA HEAD Posterior circulation: Dominant distal right vertebral artery with mild V4 segment calcified plaque not resulting in stenosis. The right vertebral primarily supplies the basilar artery. Normal right PICA origin. Non dominant and diminutive left V4 segment is patent but thread-like. The left AICA appears dominant. No basilar artery stenosis. SCA origins are patent. There are fetal type bilateral PCA origins, the left P1 is larger. Bilateral PCA branches are within normal limits. There is an incidental prominent left basal vein of Rosenthal communicating with a large left tentorial vein which appears to be a normal variant (series 16, image 55). No AVM is identified. Anterior circulation: Both ICA siphons are patent. On the right there is mild to moderate siphon calcified plaque with mild stenosis. Right ophthalmic and posterior communicating artery origins are normal. On the left there is moderate to severe  calcified plaque with moderate stenosis of both the left cavernous and supraclinoid ICA segments. The left ophthalmic and posterior communicating artery origins are normal. Patent carotid termini. Normal MCA and right ACA origins. There is mild to moderate irregularity and stenosis at the left ACA origin. The A1 segments are fairly codominant. Anterior communicating artery and bilateral ACA branches are within normal limits. Left MCA M1 segment, bifurcation, and left MCA  branches are within normal limits. Right MCA M1 segment, trifurcation, and right MCA branches are within normal limits. Venous sinuses: Patent. Prominent asymmetric left tentorial vein which appears to communicate with the left basal vein of Rosenthal stated above appears to be a normal variant. Dominant left transverse and sigmoid sinuses is a normal variant. Anatomic variants: Dominant right and diminutive left vertebral arteries. The left vertebral artery arises directly from the arch. Dominant left transverse and sigmoid sinuses as well as a prominent asymmetric left tentorial vein. Delayed phase: No abnormal enhancement identified. Review of the MIP images confirms the above findings IMPRESSION: 1. Radiographic-String-Sign Stenosis of the proximal Left ICA along a segment of about 14 mm due to bulky calcified plaque. Associated decreased caliber of the Left ICA. Superimposed downstream moderate to severe multifocal Left ICA siphon stenosis due to calcified plaque. 2. High-grade proximal Right ICA stenosis also due to bulky calcified plaque is numerically estimated at at least 75%, and appears to approach are Radiographic-String-Sign on some images. Mild to moderate Right ICA siphon stenosis due to calcified plaque. 3. Dominant Right Vertebral Artery with moderate to severe stenosis at its origin due to calcified plaque. Additional right vertebral calcified plaque without stenosis. 4. Non dominant and diminutive Left vertebral artery which  arises directly from the arch. 5. Mild to moderate stenosis at the Left ACA origin appears to be atherosclerotic. No other intracranial arterial stenosis. 6. Asymmetric left tentorial vein associated with the left basal vein of Rosenthal appears to be a normal anatomic variant. 7. No acute intracranial abnormality. Mild for age cerebral white matter disease. 8. Aortic Atherosclerosis (ICD10-I70.0) and Emphysema (ICD10-J43.9). Electronically Signed   By: Genevie Ann M.D.   On: 03/20/2017 10:14   Ct Angio Neck W Or Wo Contrast  Result Date: 03/20/2017 CLINICAL DATA:  62 year old female with evidence of left carotid short segment occlusion or near occlusion, and high-grade right ICA stenosis on carotid Doppler ultrasound 03/08/2017. Subsequent encounter. EXAM: CT ANGIOGRAPHY HEAD AND NECK TECHNIQUE: Multidetector CT imaging of the head and neck was performed using the standard protocol during bolus administration of intravenous contrast. Multiplanar CT image reconstructions and MIPs were obtained to evaluate the vascular anatomy. Carotid stenosis measurements (when applicable) are obtained utilizing NASCET criteria, using the distal internal carotid diameter as the denominator. CONTRAST:  31mL ISOVUE-370 IOPAMIDOL (ISOVUE-370) INJECTION 76% COMPARISON:  Report of carotid Doppler ultrasound 03/08/2017. Head CT and cervical spine CT without contrast 06/05/2012. FINDINGS: CT HEAD Brain: Cerebral volume is stable since 2014 and normal for age. Mild for age scattered cerebral white matter hypodensity. No midline shift, ventriculomegaly, mass effect, evidence of mass lesion, intracranial hemorrhage or evidence of cortically based acute infarction. No cortical encephalomalacia identified. Calvarium and skull base: Negative; mild motion artifact at the skullbase. Paranasal sinuses: Clear. Orbits: Visualized orbits and scalp soft tissues are within normal limits. CTA NECK Skeleton: Absent dentition aside from impacted left  maxillary wisdom tooth. No acute osseous abnormality identified. Upper chest: Centrilobular emphysema suspected. Otherwise negative visible lung parenchyma. No superior mediastinal lymphadenopathy. Other neck: Subcentimeter hypodense right anterior thyroid lobe nodule on series 5, image 84 does not meet consensus criteria for ultrasound follow-up. The glottis is closed and there is intermittent motion artifact in the pharynx. No neck mass or lymphadenopathy identified. Aortic arch: Calcified aortic atherosclerosis. Four vessel arch configuration, the left vertebral artery appears to arise directly from the arch (series 5, image 108). Right carotid system: No brachiocephalic artery or right CCA origin stenosis despite calcified  plaque. The right CCA is then negative until the right carotid bifurcation where bulky calcified plaque occurs affecting both the right ICA and ECA origins. High-grade right ICA origin stenosis results, numerically estimated at 75 % with respect to the distal vessel , and might be higher owing to the complex configuration of what appears to be both calcified and soft plaque on series 13, image 175. The cervical right ICA remains patent and has a normal caliber distally. Left carotid system: No left CCA origin stenosis despite calcified plaque. There is some circumferential soft plaque in the left CCA proximal to the bifurcation without stenosis. Bulky calcified plaque at the left carotid bifurcation continues into the left ICA origin and bulb with what appears essentially as a radiographic string sign stenosis over a 13-14 mm segment of the vessel (series 12, image 138. The downstream left ICA caliber is diminished to about 3 mm diameter, but no bona fide occlusion of the vessel is identified. Vertebral arteries: No proximal right subclavian artery stenosis despite calcified plaque. Calcified plaque at the right vertebral artery origin with moderate to severe stenosis (series 11, image 157).  The right vertebral artery is dominant. There is intermittent right V 2 segment calcified plaque with only mild associated stenosis. Similar mild right V3 segment calcified plaque without stenosis. The left vertebral artery is non dominant, diminutive, and arises directly from the arch. The left vertebral artery is only 1-2 mm diameter throughout its course but does remain patent into the posterior fossa. CTA HEAD Posterior circulation: Dominant distal right vertebral artery with mild V4 segment calcified plaque not resulting in stenosis. The right vertebral primarily supplies the basilar artery. Normal right PICA origin. Non dominant and diminutive left V4 segment is patent but thread-like. The left AICA appears dominant. No basilar artery stenosis. SCA origins are patent. There are fetal type bilateral PCA origins, the left P1 is larger. Bilateral PCA branches are within normal limits. There is an incidental prominent left basal vein of Rosenthal communicating with a large left tentorial vein which appears to be a normal variant (series 16, image 55). No AVM is identified. Anterior circulation: Both ICA siphons are patent. On the right there is mild to moderate siphon calcified plaque with mild stenosis. Right ophthalmic and posterior communicating artery origins are normal. On the left there is moderate to severe calcified plaque with moderate stenosis of both the left cavernous and supraclinoid ICA segments. The left ophthalmic and posterior communicating artery origins are normal. Patent carotid termini. Normal MCA and right ACA origins. There is mild to moderate irregularity and stenosis at the left ACA origin. The A1 segments are fairly codominant. Anterior communicating artery and bilateral ACA branches are within normal limits. Left MCA M1 segment, bifurcation, and left MCA branches are within normal limits. Right MCA M1 segment, trifurcation, and right MCA branches are within normal limits. Venous sinuses:  Patent. Prominent asymmetric left tentorial vein which appears to communicate with the left basal vein of Rosenthal stated above appears to be a normal variant. Dominant left transverse and sigmoid sinuses is a normal variant. Anatomic variants: Dominant right and diminutive left vertebral arteries. The left vertebral artery arises directly from the arch. Dominant left transverse and sigmoid sinuses as well as a prominent asymmetric left tentorial vein. Delayed phase: No abnormal enhancement identified. Review of the MIP images confirms the above findings IMPRESSION: 1. Radiographic-String-Sign Stenosis of the proximal Left ICA along a segment of about 14 mm due to bulky calcified plaque. Associated decreased  caliber of the Left ICA. Superimposed downstream moderate to severe multifocal Left ICA siphon stenosis due to calcified plaque. 2. High-grade proximal Right ICA stenosis also due to bulky calcified plaque is numerically estimated at at least 75%, and appears to approach are Radiographic-String-Sign on some images. Mild to moderate Right ICA siphon stenosis due to calcified plaque. 3. Dominant Right Vertebral Artery with moderate to severe stenosis at its origin due to calcified plaque. Additional right vertebral calcified plaque without stenosis. 4. Non dominant and diminutive Left vertebral artery which arises directly from the arch. 5. Mild to moderate stenosis at the Left ACA origin appears to be atherosclerotic. No other intracranial arterial stenosis. 6. Asymmetric left tentorial vein associated with the left basal vein of Rosenthal appears to be a normal anatomic variant. 7. No acute intracranial abnormality. Mild for age cerebral white matter disease. 8. Aortic Atherosclerosis (ICD10-I70.0) and Emphysema (ICD10-J43.9). Electronically Signed   By: Genevie Ann M.D.   On: 03/20/2017 10:14   Dg Foot Complete Right  Result Date: 04/04/2017 CLINICAL DATA:  Diabetic foot ulcer for several weeks. Right foot  pain. EXAM: RIGHT FOOT COMPLETE - 3+ VIEW COMPARISON:  None. FINDINGS: Osseous alignment is normal. Bone mineralization is normal. No fracture line or displaced fracture fragment identified. No acute or suspicious osseous lesion. No destructive change to suggest osteomyelitis. Soft tissues about the right foot are unremarkable. IMPRESSION: Negative. Electronically Signed   By: Franki Cabot M.D.   On: 04/04/2017 20:48   Dg Toe 2nd Right  Result Date: 03/29/2017 CLINICAL DATA:  Swelling and infection of the right second toe. EXAM: RIGHT SECOND TOE COMPARISON:  None. FINDINGS: There is no evidence of fracture or dislocation. There is no evidence of osteolytic changes. Diffuse soft tissue swelling of the second digit. IMPRESSION: No radiographic evidence of osteomyelitis. Diffuse soft tissue swelling of the second digit without soft tissue emphysema. Electronically Signed   By: Fidela Salisbury M.D.   On: 03/29/2017 22:08    Lab Data:  CBC: Recent Labs  Lab 03/29/17 2122 04/04/17 1347 04/05/17 0456  WBC 9.8 8.8 7.8  NEUTROABS 6.3 6.2  --   HGB 15.2* 14.5 14.2  HCT 46.0 43.1 43.3  MCV 82.7 83.0 83.8  PLT 119* 147* 829*   Basic Metabolic Panel: Recent Labs  Lab 03/29/17 2122 04/04/17 1347 04/05/17 0456  NA 132* 130* 134*  K 4.0 4.1 3.9  CL 95* 92* 96*  CO2 26 25 26   GLUCOSE 152* 284* 106*  BUN 6 7 5*  CREATININE 0.49 0.54 0.46  CALCIUM 9.4 9.5 9.5   GFR: Estimated Creatinine Clearance: 94.9 mL/min (by C-G formula based on SCr of 0.46 mg/dL). Liver Function Tests: Recent Labs  Lab 04/04/17 1347  AST 23  ALT 20  ALKPHOS 105  BILITOT 0.6  PROT 6.3*  ALBUMIN 3.6   No results for input(s): LIPASE, AMYLASE in the last 168 hours. No results for input(s): AMMONIA in the last 168 hours. Coagulation Profile: Recent Labs  Lab 03/30/17 0528  INR 1.06   Cardiac Enzymes: No results for input(s): CKTOTAL, CKMB, CKMBINDEX, TROPONINI in the last 168 hours. BNP (last 3  results) No results for input(s): PROBNP in the last 8760 hours. HbA1C: No results for input(s): HGBA1C in the last 72 hours. CBG: Recent Labs  Lab 03/30/17 0755 04/04/17 2359 04/05/17 0626 04/05/17 1224  GLUCAP 195* 238* 102* 70   Lipid Profile: No results for input(s): CHOL, HDL, LDLCALC, TRIG, CHOLHDL, LDLDIRECT in the last 72  hours. Thyroid Function Tests: No results for input(s): TSH, T4TOTAL, FREET4, T3FREE, THYROIDAB in the last 72 hours. Anemia Panel: No results for input(s): VITAMINB12, FOLATE, FERRITIN, TIBC, IRON, RETICCTPCT in the last 72 hours. Urine analysis:    Component Value Date/Time   COLORURINE YELLOW 04/05/2017 0928   APPEARANCEUR CLEAR 04/05/2017 0928   LABSPEC 1.008 04/05/2017 0928   PHURINE 7.0 04/05/2017 0928   GLUCOSEU NEGATIVE 04/05/2017 0928   HGBUR NEGATIVE 04/05/2017 0928   BILIRUBINUR NEGATIVE 04/05/2017 0928   BILIRUBINUR neg 01/28/2013 1327   KETONESUR NEGATIVE 04/05/2017 0928   PROTEINUR NEGATIVE 04/05/2017 0928   UROBILINOGEN negative 01/28/2013 1327   UROBILINOGEN 1.0 08/09/2007 0200   NITRITE NEGATIVE 04/05/2017 0928   LEUKOCYTESUR NEGATIVE 04/05/2017 0928     Ripudeep Rai M.D. [facility] 04/05/2017, 1:16 PM  Pager: [phone] Between 7am to 7pm - call Pager - [phone]  After 7pm go to [email or web address] - password [email or web address]  Call night coverage person covering after 7pm

## 2017-04-05 NOTE — Progress Notes (Signed)
Patient has been admitted for bacteremia.  Positive for strep in blood stream.  Called Dr. Stephens Shire office and spoke with his nurse, Claiborne Billings (hip[facility] verified pt name/dob).  Informed her of patient's admission and that she would not be seen in preadmission today.  She said she would inform Dr. Trula Slade and the case would most likely be cancelled.

## 2017-04-06 ENCOUNTER — Inpatient Hospital Stay (HOSPITAL_COMMUNITY): Payer: BLUE CROSS/BLUE SHIELD

## 2017-04-06 LAB — BASIC METABOLIC PANEL
Anion gap: 13 (ref 5–15)
BUN: 6 mg/dL (ref 6–20)
CALCIUM: 9.6 mg/dL (ref 8.9–10.3)
CO2: 24 mmol/L (ref 22–32)
CREATININE: 0.53 mg/dL (ref 0.44–1.00)
Chloride: 95 mmol/L — ABNORMAL LOW (ref 101–111)
GFR calc Af Amer: >60 mL/min (ref >60)
Glucose, Bld: 106 mg/dL — ABNORMAL HIGH (ref 65–99)
POTASSIUM: 4 mmol/L (ref 3.5–5.1)
SODIUM: 132 mmol/L — AB (ref 135–145)

## 2017-04-06 LAB — CBC
HEMATOCRIT: 45.4 % (ref 36.0–46.0)
Hemoglobin: 14.7 g/dL (ref 12.0–15.0)
MCH: 26.8 pg (ref 26.0–34.0)
MCHC: 32.4 g/dL (ref 30.0–36.0)
MCV: 82.8 fL (ref 78.0–100.0)
PLATELETS: 130 10*3/uL — AB (ref 150–400)
RBC: 5.48 MIL/uL — ABNORMAL HIGH (ref 3.87–5.11)
RDW: 14.8 % (ref 11.5–15.5)
WBC: 6.4 10*3/uL (ref 4.0–10.5)

## 2017-04-06 LAB — GLUCOSE, CAPILLARY
GLUCOSE-CAPILLARY: 184 mg/dL — AB (ref 65–99)
Glucose-Capillary: 163 mg/dL — ABNORMAL HIGH (ref 65–99)
Glucose-Capillary: 70 mg/dL (ref 65–99)
Glucose-Capillary: 134 mg/dL — ABNORMAL HIGH (ref 65–99)
Glucose-Capillary: 178 mg/dL — ABNORMAL HIGH (ref 65–99)

## 2017-04-06 MED ORDER — GABAPENTIN 600 MG PO TABS
300.0000 mg | ORAL_TABLET | Freq: Two times a day (BID) | ORAL | Status: DC
  Administered 2017-04-06 – 2017-04-10 (×7): 300 mg via ORAL
  Filled 2017-04-06 (×8): qty 1

## 2017-04-06 MED ORDER — HEPARIN SODIUM (PORCINE) 5000 UNIT/ML IJ SOLN
5000.0000 [IU] | Freq: Three times a day (TID) | INTRAMUSCULAR | Status: AC
  Administered 2017-04-06: 5000 [IU] via SUBCUTANEOUS
  Filled 2017-04-06 (×2): qty 1

## 2017-04-06 MED ORDER — INSULIN ASPART PROT & ASPART (70-30 MIX) 100 UNIT/ML ~~LOC~~ SUSP
50.0000 [IU] | Freq: Two times a day (BID) | SUBCUTANEOUS | Status: DC
  Administered 2017-04-06 – 2017-04-10 (×6): 50 [IU] via SUBCUTANEOUS
  Filled 2017-04-06 (×3): qty 10

## 2017-04-06 NOTE — Progress Notes (Signed)
Spoke with patient. Was diagnosed in 2001.  Was on oral agents initially, then changed to diet and exercise and lost weight at that time. Sees Dr. Chalmers Cater as her endocrinologist. Started on insulin about 3-4 years ago. Has been on Novolin 70/30 60 units BID. Her HgbA1C was 8.2% the last time Dr. Chalmers Cater checked it.  Has some vascular issues in the right foot and will need bypass graft tomorrow. Carotid surgery will be next week. States that she has attended the diabetes classes at [facility] at the time of her diagnosis. Will continue to monitor blood sugars while in the hospital.   Harvel Ricks RN BSN CDE Diabetes Coordinator Pager: [phone]  8am-5pm

## 2017-04-06 NOTE — Progress Notes (Addendum)
[facility]                                                                              Patient Demographics  [name] [name], is a 62 y.o. female, DOB - 13-Dec-1955, QBH:[id number]  Admit date - 04/04/2017   Admitting Physician Rise Patience, MD  Outpatient Primary MD for the patient is Jacelyn Pi, MD  Outpatient specialists:   LOS - 2  days   Medical records reviewed and are as summarized below:    Chief Complaint  Patient presents with  . Abnormal Lab       Brief summary    [name] [name] is a 62 y.o. female with history of peripheral vascular disease, diabetes mellitus type 2, hyperlipidemia, tobacco abuse, thrombocytopenia, hypertension who was recently admitted and discharged on January 10 for right leg cellulitis and second toe gangrene was instructed to come to the ER after patient's blood cultures drawn during that stay grew Streptococcus variance in both bottles.  Patient was discharged home on Keflex.  Patient denied any fever chills after discharge.  Vascular surgeon Dr. Trula Slade was planning to have patient's carotid surgery this week.  Assessment & Plan    Principal Problem:   Bacteremia in the setting of gangrenous second right toe and right foot cellulitis - X-ray negative for osteomyelitis - Blood cultures positive for Streptococcus meridians 2/2, 03/30/17, repeat blood cultures 1/15 negative so far - Continue IV Rocephin for now, appreciate ID recommendations - Patient had abdominal aortogram with lower extremity on 02/28/17 due to bilateral leg pain with ulcers, which had shown no significant aortoiliac occlusive disease, high-grade calcific stenotic lesions in the right common femoral artery with superficial femoral artery occlusion, occluded left superficial femoral artery with reconstitution of above-knee popliteal artery.  Was recommended bypass by vascular surgery. - Patient unable to tolerate MRI of the right foot, complaining of  more pain and redness, swelling in the foot today.  CT of the right foot showed no osteomyelitis. - consulted Dr Trula Slade, will follow recommendations regarding severe PVD and gangrenous second right toe.  Per patient, she was seen by Dr. Trula Slade and recommended surgery (?),  Will make n.p.o. after midnight  Active Problems:   DM (diabetes mellitus), type 2 with peripheral vascular complications ([place]) -CBGs lower today 70, decreased NovoLog 70/30 to 50units BID    Hypertension -Currently stable, continue lisinopril, HCTZ, monitor creatinine  Chronic thrombocytopenia ([facility]) -PLT counts stable so far, monitor closely    PVD (peripheral vascular disease) ([place]), critical carotid stenosis -Vascular surgery following, postponed carotid surgery.   Code Status: Full CODE STATUS DVT Prophylaxis: Heparin subcu Family Communication: Discussed in detail with the patient, all imaging results, lab results explained to the patient   Disposition Plan:   Time Spent in minutes  25 minutes  Procedures:  X-ray right foot  Consultants:   ID Vascular surgery  Antimicrobials:   IV Rocephin 1/15   Medications  Scheduled Meds: . aspirin EC  81 mg Oral Daily  . atorvastatin  20 mg Oral Daily  . folic acid  1 mg Oral Daily  . gabapentin  300 mg Oral  BID  . heparin injection (subcutaneous)  5,000 Units Subcutaneous Q8H  . hydrochlorothiazide  25 mg Oral Daily  . insulin aspart  0-9 Units Subcutaneous TID WC  . insulin aspart protamine- aspart  60 Units Subcutaneous BID  . lisinopril  5 mg Oral Daily  . pneumococcal 23 valent vaccine  0.5 mL Intramuscular Tomorrow-1000  . sertraline  100 mg Oral Daily   Continuous Infusions: . cefTRIAXone (ROCEPHIN)  IV Stopped (04/06/17 0655)   PRN Meds:.acetaminophen **OR** acetaminophen, albuterol, ondansetron **OR** ondansetron (ZOFRAN) IV, oxyCODONE-acetaminophen   Antibiotics   Anti-infectives (From admission, onward)   Start     Dose/Rate Route  Frequency Ordered Stop   04/05/17 0400  cefTRIAXone (ROCEPHIN) 2 g in dextrose 5 % 50 mL IVPB     2 g 100 mL/hr over 30 Minutes Intravenous Every 24 hours 04/04/17 2111     04/04/17 2000  ceFEPIme (MAXIPIME) 2 g in dextrose 5 % 50 mL IVPB  Status:  Discontinued     2 g 100 mL/hr over 30 Minutes Intravenous Every 8 hours 04/04/17 1959 04/04/17 2111        Subjective:   [name] [name] was seen and examined today.  Complaining of pain in the right foot, burning, feels more swollen and red.  No fevers.  Patient denies dizziness, chest pain, shortness of breath, abdominal pain, N/V/D/C, new weakness, numbess, tingling.  Unable to tolerate MRI.Marland Kitchen    Objective:   Vitals:   04/05/17 0523 04/05/17 1308 04/05/17 2222 04/06/17 0615  BP:  125/64 (!) 145/80 (!) 156/66  Pulse:  92 91 83  Resp:  17 18 16   Temp:  98 F (36.7 C)  97.8 F (36.6 C)  TempSrc:  Oral  Oral  SpO2:  95% 92% 93%  Weight: 116.4 kg (256 lb 9.9 oz)     Height: 5\' 7"  (1.702 m)       Intake/Output Summary (Last 24 hours) at 04/06/2017 1430 Last data filed at 04/06/2017 0900 Gross per 24 hour  Intake 280 ml  Output -  Net 280 ml     Wt Readings from Last 3 Encounters:  04/05/17 116.4 kg (256 lb 9.9 oz)  03/29/17 119.3 kg (263 lb)  03/13/17 119.3 kg (263 lb)     Exam   General: Alert and oriented x 3, NAD  Eyes:   HEENT:  Atraumatic, normocephalic  Cardiovascular: S1 S2  clear, RRR No pedal edema b/l  Respiratory: Clear to auscultation bilaterally, no wheezing, rales or rhonchi  Gastrointestinal: Soft, nontender, nondistended, + bowel sounds  Ext: no pedal edema bilaterally  Neuro: no new deficits  Musculoskeletal: No digital cyanosis, clubbing  Skin: Right foot cellulitic changes with erythema, dry gangrene of second toe, no drainage.    Psych: Normal affect and demeanor, alert and oriented x3    Data Reviewed:  I have personally reviewed following labs and imaging studies  Micro  Results Recent Results (from the past 240 hour(s))  Culture, blood (x 2)     Status: Abnormal   Collection Time: 03/30/17  5:25 AM  Result Value Ref Range Status   Specimen Description BLOOD WRIST RIGHT  Final   Special Requests   Final    BOTTLES DRAWN AEROBIC AND ANAEROBIC Blood Culture adequate volume   Culture  Setup Time   Final    GRAM POSITIVE COCCI AEROBIC BOTTLE ONLY CRITICAL VALUE NOTED.  VALUE IS CONSISTENT WITH PREVIOUSLY REPORTED AND CALLED VALUE.    Culture (A)  Final  VIRIDANS STREPTOCOCCUS SUSCEPTIBILITIES PERFORMED ON PREVIOUS CULTURE WITHIN THE LAST 5 DAYS.    Report Status 04/03/2017 FINAL  Final  Culture, blood (x 2)     Status: Abnormal   Collection Time: 03/30/17  5:32 AM  Result Value Ref Range Status   Specimen Description BLOOD LEFT ANTECUBITAL  Final   Special Requests IN PEDIATRIC BOTTLE Blood Culture adequate volume  Final   Culture  Setup Time   Final    GRAM POSITIVE COCCI IN CHAINS IN PEDIATRIC BOTTLE CRITICAL RESULT CALLED TO, READ BACK BY AND VERIFIED WITH: S. COBLE RN 03/31/17 0035 BEAMJ    Culture VIRIDANS STREPTOCOCCUS (A)  Final   Report Status 04/03/2017 FINAL  Final   Organism ID, Bacteria VIRIDANS STREPTOCOCCUS  Final      Susceptibility   Viridans streptococcus - MIC*    PENICILLIN <=0.06 SENSITIVE Sensitive     CEFTRIAXONE <=0.12 SENSITIVE Sensitive     ERYTHROMYCIN 2 RESISTANT Resistant     LEVOFLOXACIN 1 SENSITIVE Sensitive     VANCOMYCIN 1 SENSITIVE Sensitive     * VIRIDANS STREPTOCOCCUS  Blood Culture ID Panel (Reflexed)     Status: Abnormal   Collection Time: 03/30/17  5:32 AM  Result Value Ref Range Status   Enterococcus species NOT DETECTED NOT DETECTED Final   Listeria monocytogenes NOT DETECTED NOT DETECTED Final   Staphylococcus species NOT DETECTED NOT DETECTED Final   Staphylococcus aureus NOT DETECTED NOT DETECTED Final   Streptococcus species DETECTED (A) NOT DETECTED Final    Comment: Not Enterococcus species,  Streptococcus agalactiae, Streptococcus pyogenes, or Streptococcus pneumoniae. CRITICAL RESULT CALLED TO, READ BACK BY AND VERIFIED WITH: S. COBLE RN 03/31/17 0035 BEAMJ    Streptococcus agalactiae NOT DETECTED NOT DETECTED Final   Streptococcus pneumoniae NOT DETECTED NOT DETECTED Final   Streptococcus pyogenes NOT DETECTED NOT DETECTED Final   Acinetobacter baumannii NOT DETECTED NOT DETECTED Final   Enterobacteriaceae species NOT DETECTED NOT DETECTED Final   Enterobacter cloacae complex NOT DETECTED NOT DETECTED Final   Escherichia coli NOT DETECTED NOT DETECTED Final   Klebsiella oxytoca NOT DETECTED NOT DETECTED Final   Klebsiella pneumoniae NOT DETECTED NOT DETECTED Final   Proteus species NOT DETECTED NOT DETECTED Final   Serratia marcescens NOT DETECTED NOT DETECTED Final   Haemophilus influenzae NOT DETECTED NOT DETECTED Final   Neisseria meningitidis NOT DETECTED NOT DETECTED Final   Pseudomonas aeruginosa NOT DETECTED NOT DETECTED Final   Candida albicans NOT DETECTED NOT DETECTED Final   Candida glabrata NOT DETECTED NOT DETECTED Final   Candida krusei NOT DETECTED NOT DETECTED Final   Candida parapsilosis NOT DETECTED NOT DETECTED Final   Candida tropicalis NOT DETECTED NOT DETECTED Final  Culture, blood (routine x 2)     Status: None (Preliminary result)   Collection Time: 04/04/17  8:06 PM  Result Value Ref Range Status   Specimen Description BLOOD RIGHT ANTECUBITAL  Final   Special Requests   Final    BOTTLES DRAWN AEROBIC AND ANAEROBIC Blood Culture adequate volume   Culture NO GROWTH 2 DAYS  Final   Report Status PENDING  Incomplete  Culture, blood (routine x 2)     Status: None (Preliminary result)   Collection Time: 04/04/17  9:52 PM  Result Value Ref Range Status   Specimen Description BLOOD LEFT ANTECUBITAL  Final   Special Requests   Final    BOTTLES DRAWN AEROBIC AND ANAEROBIC Blood Culture adequate volume   Culture NO GROWTH 2 DAYS  Final  Report  Status PENDING  Incomplete    Radiology Reports Ct Angio Head W Or Wo Contrast  Result Date: 03/20/2017 CLINICAL DATA:  62 year old female with evidence of left carotid short segment occlusion or near occlusion, and high-grade right ICA stenosis on carotid Doppler ultrasound 03/08/2017. Subsequent encounter. EXAM: CT ANGIOGRAPHY HEAD AND NECK TECHNIQUE: Multidetector CT imaging of the head and neck was performed using the standard protocol during bolus administration of intravenous contrast. Multiplanar CT image reconstructions and MIPs were obtained to evaluate the vascular anatomy. Carotid stenosis measurements (when applicable) are obtained utilizing NASCET criteria, using the distal internal carotid diameter as the denominator. CONTRAST:  48mL ISOVUE-370 IOPAMIDOL (ISOVUE-370) INJECTION 76% COMPARISON:  Report of carotid Doppler ultrasound 03/08/2017. Head CT and cervical spine CT without contrast 06/05/2012. FINDINGS: CT HEAD Brain: Cerebral volume is stable since 2014 and normal for age. Mild for age scattered cerebral white matter hypodensity. No midline shift, ventriculomegaly, mass effect, evidence of mass lesion, intracranial hemorrhage or evidence of cortically based acute infarction. No cortical encephalomalacia identified. Calvarium and skull base: Negative; mild motion artifact at the skullbase. Paranasal sinuses: Clear. Orbits: Visualized orbits and scalp soft tissues are within normal limits. CTA NECK Skeleton: Absent dentition aside from impacted left maxillary wisdom tooth. No acute osseous abnormality identified. Upper chest: Centrilobular emphysema suspected. Otherwise negative visible lung parenchyma. No superior mediastinal lymphadenopathy. Other neck: Subcentimeter hypodense right anterior thyroid lobe nodule on series 5, image 84 does not meet consensus criteria for ultrasound follow-up. The glottis is closed and there is intermittent motion artifact in the pharynx. No neck mass or  lymphadenopathy identified. Aortic arch: Calcified aortic atherosclerosis. Four vessel arch configuration, the left vertebral artery appears to arise directly from the arch (series 5, image 108). Right carotid system: No brachiocephalic artery or right CCA origin stenosis despite calcified plaque. The right CCA is then negative until the right carotid bifurcation where bulky calcified plaque occurs affecting both the right ICA and ECA origins. High-grade right ICA origin stenosis results, numerically estimated at 75 % with respect to the distal vessel , and might be higher owing to the complex configuration of what appears to be both calcified and soft plaque on series 13, image 175. The cervical right ICA remains patent and has a normal caliber distally. Left carotid system: No left CCA origin stenosis despite calcified plaque. There is some circumferential soft plaque in the left CCA proximal to the bifurcation without stenosis. Bulky calcified plaque at the left carotid bifurcation continues into the left ICA origin and bulb with what appears essentially as a radiographic string sign stenosis over a 13-14 mm segment of the vessel (series 12, image 138. The downstream left ICA caliber is diminished to about 3 mm diameter, but no bona fide occlusion of the vessel is identified. Vertebral arteries: No proximal right subclavian artery stenosis despite calcified plaque. Calcified plaque at the right vertebral artery origin with moderate to severe stenosis (series 11, image 157). The right vertebral artery is dominant. There is intermittent right V 2 segment calcified plaque with only mild associated stenosis. Similar mild right V3 segment calcified plaque without stenosis. The left vertebral artery is non dominant, diminutive, and arises directly from the arch. The left vertebral artery is only 1-2 mm diameter throughout its course but does remain patent into the posterior fossa. CTA HEAD Posterior circulation:  Dominant distal right vertebral artery with mild V4 segment calcified plaque not resulting in stenosis. The right vertebral primarily supplies the basilar artery. Normal  right PICA origin. Non dominant and diminutive left V4 segment is patent but thread-like. The left AICA appears dominant. No basilar artery stenosis. SCA origins are patent. There are fetal type bilateral PCA origins, the left P1 is larger. Bilateral PCA branches are within normal limits. There is an incidental prominent left basal vein of Rosenthal communicating with a large left tentorial vein which appears to be a normal variant (series 16, image 55). No AVM is identified. Anterior circulation: Both ICA siphons are patent. On the right there is mild to moderate siphon calcified plaque with mild stenosis. Right ophthalmic and posterior communicating artery origins are normal. On the left there is moderate to severe calcified plaque with moderate stenosis of both the left cavernous and supraclinoid ICA segments. The left ophthalmic and posterior communicating artery origins are normal. Patent carotid termini. Normal MCA and right ACA origins. There is mild to moderate irregularity and stenosis at the left ACA origin. The A1 segments are fairly codominant. Anterior communicating artery and bilateral ACA branches are within normal limits. Left MCA M1 segment, bifurcation, and left MCA branches are within normal limits. Right MCA M1 segment, trifurcation, and right MCA branches are within normal limits. Venous sinuses: Patent. Prominent asymmetric left tentorial vein which appears to communicate with the left basal vein of Rosenthal stated above appears to be a normal variant. Dominant left transverse and sigmoid sinuses is a normal variant. Anatomic variants: Dominant right and diminutive left vertebral arteries. The left vertebral artery arises directly from the arch. Dominant left transverse and sigmoid sinuses as well as a prominent asymmetric  left tentorial vein. Delayed phase: No abnormal enhancement identified. Review of the MIP images confirms the above findings IMPRESSION: 1. Radiographic-String-Sign Stenosis of the proximal Left ICA along a segment of about 14 mm due to bulky calcified plaque. Associated decreased caliber of the Left ICA. Superimposed downstream moderate to severe multifocal Left ICA siphon stenosis due to calcified plaque. 2. High-grade proximal Right ICA stenosis also due to bulky calcified plaque is numerically estimated at at least 75%, and appears to approach are Radiographic-String-Sign on some images. Mild to moderate Right ICA siphon stenosis due to calcified plaque. 3. Dominant Right Vertebral Artery with moderate to severe stenosis at its origin due to calcified plaque. Additional right vertebral calcified plaque without stenosis. 4. Non dominant and diminutive Left vertebral artery which arises directly from the arch. 5. Mild to moderate stenosis at the Left ACA origin appears to be atherosclerotic. No other intracranial arterial stenosis. 6. Asymmetric left tentorial vein associated with the left basal vein of Rosenthal appears to be a normal anatomic variant. 7. No acute intracranial abnormality. Mild for age cerebral white matter disease. 8. Aortic Atherosclerosis (ICD10-I70.0) and Emphysema (ICD10-J43.9). Electronically Signed   By: Genevie Ann M.D.   On: 03/20/2017 10:14   Ct Angio Neck W Or Wo Contrast  Result Date: 03/20/2017 CLINICAL DATA:  62 year old female with evidence of left carotid short segment occlusion or near occlusion, and high-grade right ICA stenosis on carotid Doppler ultrasound 03/08/2017. Subsequent encounter. EXAM: CT ANGIOGRAPHY HEAD AND NECK TECHNIQUE: Multidetector CT imaging of the head and neck was performed using the standard protocol during bolus administration of intravenous contrast. Multiplanar CT image reconstructions and MIPs were obtained to evaluate the vascular anatomy. Carotid  stenosis measurements (when applicable) are obtained utilizing NASCET criteria, using the distal internal carotid diameter as the denominator. CONTRAST:  38mL ISOVUE-370 IOPAMIDOL (ISOVUE-370) INJECTION 76% COMPARISON:  Report of carotid Doppler ultrasound 03/08/2017. Head  CT and cervical spine CT without contrast 06/05/2012. FINDINGS: CT HEAD Brain: Cerebral volume is stable since 2014 and normal for age. Mild for age scattered cerebral white matter hypodensity. No midline shift, ventriculomegaly, mass effect, evidence of mass lesion, intracranial hemorrhage or evidence of cortically based acute infarction. No cortical encephalomalacia identified. Calvarium and skull base: Negative; mild motion artifact at the skullbase. Paranasal sinuses: Clear. Orbits: Visualized orbits and scalp soft tissues are within normal limits. CTA NECK Skeleton: Absent dentition aside from impacted left maxillary wisdom tooth. No acute osseous abnormality identified. Upper chest: Centrilobular emphysema suspected. Otherwise negative visible lung parenchyma. No superior mediastinal lymphadenopathy. Other neck: Subcentimeter hypodense right anterior thyroid lobe nodule on series 5, image 84 does not meet consensus criteria for ultrasound follow-up. The glottis is closed and there is intermittent motion artifact in the pharynx. No neck mass or lymphadenopathy identified. Aortic arch: Calcified aortic atherosclerosis. Four vessel arch configuration, the left vertebral artery appears to arise directly from the arch (series 5, image 108). Right carotid system: No brachiocephalic artery or right CCA origin stenosis despite calcified plaque. The right CCA is then negative until the right carotid bifurcation where bulky calcified plaque occurs affecting both the right ICA and ECA origins. High-grade right ICA origin stenosis results, numerically estimated at 75 % with respect to the distal vessel , and might be higher owing to the complex  configuration of what appears to be both calcified and soft plaque on series 13, image 175. The cervical right ICA remains patent and has a normal caliber distally. Left carotid system: No left CCA origin stenosis despite calcified plaque. There is some circumferential soft plaque in the left CCA proximal to the bifurcation without stenosis. Bulky calcified plaque at the left carotid bifurcation continues into the left ICA origin and bulb with what appears essentially as a radiographic string sign stenosis over a 13-14 mm segment of the vessel (series 12, image 138. The downstream left ICA caliber is diminished to about 3 mm diameter, but no bona fide occlusion of the vessel is identified. Vertebral arteries: No proximal right subclavian artery stenosis despite calcified plaque. Calcified plaque at the right vertebral artery origin with moderate to severe stenosis (series 11, image 157). The right vertebral artery is dominant. There is intermittent right V 2 segment calcified plaque with only mild associated stenosis. Similar mild right V3 segment calcified plaque without stenosis. The left vertebral artery is non dominant, diminutive, and arises directly from the arch. The left vertebral artery is only 1-2 mm diameter throughout its course but does remain patent into the posterior fossa. CTA HEAD Posterior circulation: Dominant distal right vertebral artery with mild V4 segment calcified plaque not resulting in stenosis. The right vertebral primarily supplies the basilar artery. Normal right PICA origin. Non dominant and diminutive left V4 segment is patent but thread-like. The left AICA appears dominant. No basilar artery stenosis. SCA origins are patent. There are fetal type bilateral PCA origins, the left P1 is larger. Bilateral PCA branches are within normal limits. There is an incidental prominent left basal vein of Rosenthal communicating with a large left tentorial vein which appears to be a normal variant  (series 16, image 55). No AVM is identified. Anterior circulation: Both ICA siphons are patent. On the right there is mild to moderate siphon calcified plaque with mild stenosis. Right ophthalmic and posterior communicating artery origins are normal. On the left there is moderate to severe calcified plaque with moderate stenosis of both the left cavernous  and supraclinoid ICA segments. The left ophthalmic and posterior communicating artery origins are normal. Patent carotid termini. Normal MCA and right ACA origins. There is mild to moderate irregularity and stenosis at the left ACA origin. The A1 segments are fairly codominant. Anterior communicating artery and bilateral ACA branches are within normal limits. Left MCA M1 segment, bifurcation, and left MCA branches are within normal limits. Right MCA M1 segment, trifurcation, and right MCA branches are within normal limits. Venous sinuses: Patent. Prominent asymmetric left tentorial vein which appears to communicate with the left basal vein of Rosenthal stated above appears to be a normal variant. Dominant left transverse and sigmoid sinuses is a normal variant. Anatomic variants: Dominant right and diminutive left vertebral arteries. The left vertebral artery arises directly from the arch. Dominant left transverse and sigmoid sinuses as well as a prominent asymmetric left tentorial vein. Delayed phase: No abnormal enhancement identified. Review of the MIP images confirms the above findings IMPRESSION: 1. Radiographic-String-Sign Stenosis of the proximal Left ICA along a segment of about 14 mm due to bulky calcified plaque. Associated decreased caliber of the Left ICA. Superimposed downstream moderate to severe multifocal Left ICA siphon stenosis due to calcified plaque. 2. High-grade proximal Right ICA stenosis also due to bulky calcified plaque is numerically estimated at at least 75%, and appears to approach are Radiographic-String-Sign on some images. Mild to  moderate Right ICA siphon stenosis due to calcified plaque. 3. Dominant Right Vertebral Artery with moderate to severe stenosis at its origin due to calcified plaque. Additional right vertebral calcified plaque without stenosis. 4. Non dominant and diminutive Left vertebral artery which arises directly from the arch. 5. Mild to moderate stenosis at the Left ACA origin appears to be atherosclerotic. No other intracranial arterial stenosis. 6. Asymmetric left tentorial vein associated with the left basal vein of Rosenthal appears to be a normal anatomic variant. 7. No acute intracranial abnormality. Mild for age cerebral white matter disease. 8. Aortic Atherosclerosis (ICD10-I70.0) and Emphysema (ICD10-J43.9). Electronically Signed   By: Genevie Ann M.D.   On: 03/20/2017 10:14   Ct Foot Right Wo Contrast  Result Date: 04/06/2017 CLINICAL DATA:  Right foot pain at the first, second and third toes. Ulceration on the right foot. Diabetic patient. EXAM: CT OF THE RIGHT FOOT WITHOUT CONTRAST TECHNIQUE: Multidetector CT imaging of the right foot was performed according to the standard protocol. Multiplanar CT image reconstructions were also generated. COMPARISON:  Plain films right foot 04/04/2017. FINDINGS: Bones/Joint/Cartilage No acute bony or joint abnormality is seen. No bony destructive change or periosteal reaction. Ligaments Suboptimally assessed by CT. Muscles and Tendons Appear normal. Soft tissues Subcutaneous edema is present about foot. No fluid collection or soft tissue gas. IMPRESSION: Diffuse soft tissue swelling about the foot. The exam is otherwise negative. Electronically Signed   By: Inge Rise M.D.   On: 04/06/2017 10:44   Dg Foot Complete Right  Result Date: 04/04/2017 CLINICAL DATA:  Diabetic foot ulcer for several weeks. Right foot pain. EXAM: RIGHT FOOT COMPLETE - 3+ VIEW COMPARISON:  None. FINDINGS: Osseous alignment is normal. Bone mineralization is normal. No fracture line or displaced  fracture fragment identified. No acute or suspicious osseous lesion. No destructive change to suggest osteomyelitis. Soft tissues about the right foot are unremarkable. IMPRESSION: Negative. Electronically Signed   By: Franki Cabot M.D.   On: 04/04/2017 20:48   Dg Toe 2nd Right  Result Date: 03/29/2017 CLINICAL DATA:  Swelling and infection of the right second toe.  EXAM: RIGHT SECOND TOE COMPARISON:  None. FINDINGS: There is no evidence of fracture or dislocation. There is no evidence of osteolytic changes. Diffuse soft tissue swelling of the second digit. IMPRESSION: No radiographic evidence of osteomyelitis. Diffuse soft tissue swelling of the second digit without soft tissue emphysema. Electronically Signed   By: Fidela Salisbury M.D.   On: 03/29/2017 22:08    Lab Data:  CBC: Recent Labs  Lab 04/04/17 1347 04/05/17 0456 04/06/17 1019  WBC 8.8 7.8 6.4  NEUTROABS 6.2  --   --   HGB 14.5 14.2 14.7  HCT 43.1 43.3 45.4  MCV 83.0 83.8 82.8  PLT 147* 128* 102*   Basic Metabolic Panel: Recent Labs  Lab 04/04/17 1347 04/05/17 0456 04/06/17 1019  NA 130* 134* 132*  K 4.1 3.9 4.0  CL 92* 96* 95*  CO2 25 26 24   GLUCOSE 284* 106* 106*  BUN 7 5* 6  CREATININE 0.54 0.46 0.53  CALCIUM 9.5 9.5 9.6   GFR: Estimated Creatinine Clearance: 94.9 mL/min (by C-G formula based on SCr of 0.53 mg/dL). Liver Function Tests: Recent Labs  Lab 04/04/17 1347  AST 23  ALT 20  ALKPHOS 105  BILITOT 0.6  PROT 6.3*  ALBUMIN 3.6   No results for input(s): LIPASE, AMYLASE in the last 168 hours. No results for input(s): AMMONIA in the last 168 hours. Coagulation Profile: No results for input(s): INR, PROTIME in the last 168 hours. Cardiac Enzymes: No results for input(s): CKTOTAL, CKMB, CKMBINDEX, TROPONINI in the last 168 hours. BNP (last 3 results) No results for input(s): PROBNP in the last 8760 hours. HbA1C: No results for input(s): HGBA1C in the last 72 hours. CBG: Recent Labs  Lab  04/05/17 1629 04/05/17 2129 04/06/17 0617 04/06/17 0804 04/06/17 1122  GLUCAP 128* 99 163* 184* 70   Lipid Profile: No results for input(s): CHOL, HDL, LDLCALC, TRIG, CHOLHDL, LDLDIRECT in the last 72 hours. Thyroid Function Tests: No results for input(s): TSH, T4TOTAL, FREET4, T3FREE, THYROIDAB in the last 72 hours. Anemia Panel: No results for input(s): VITAMINB12, FOLATE, FERRITIN, TIBC, IRON, RETICCTPCT in the last 72 hours. Urine analysis:    Component Value Date/Time   COLORURINE YELLOW 04/05/2017 0928   APPEARANCEUR CLEAR 04/05/2017 0928   LABSPEC 1.008 04/05/2017 0928   PHURINE 7.0 04/05/2017 0928   GLUCOSEU NEGATIVE 04/05/2017 0928   HGBUR NEGATIVE 04/05/2017 0928   BILIRUBINUR NEGATIVE 04/05/2017 0928   BILIRUBINUR neg 01/28/2013 1327   KETONESUR NEGATIVE 04/05/2017 0928   PROTEINUR NEGATIVE 04/05/2017 0928   UROBILINOGEN negative 01/28/2013 1327   UROBILINOGEN 1.0 08/09/2007 0200   NITRITE NEGATIVE 04/05/2017 0928   LEUKOCYTESUR NEGATIVE 04/05/2017 0928     Jenson Beedle M.D. [facility] 04/06/2017, 2:30 PM  Pager: [phone] Between 7am to 7pm - call Pager - [phone]  After 7pm go to [email or web address] - password [email or web address]  Call night coverage person covering after 7pm

## 2017-04-06 NOTE — Progress Notes (Signed)
MRI attempted on pt at 7am.  One sequence done on the patient and she complained that it felt like her foot was on fire while scanning.  Pt in tears.  Did not feel comfortable proceeding with exam due to this complaint.

## 2017-04-06 NOTE — Progress Notes (Signed)
    Subjective  -   Having pain in the right foot.   Physical Exam:  Slight progression of the necrosis on her toe.  No significant residual erythema on the foot. Palpable femoral pulse, no palpable pedal pulse on the right Psoriatic plaque throughout the right leg       Assessment/Plan:   The patient was originally scheduled for carotid endarterectomy tomorrow.  I had originally planned to do her carotid endarterectomy prior to her bypass given the greater than 80% stenosis.  However, the patient was admitted with positive blood cultures and cellulitis in the right leg.  Because she is having progression of the ischemic changes with infection to her right leg, I told the patient that we should revise our plan and proceed with right femoral-popliteal bypass graft.  She does have an adequate vein to service conduit for bypass.  Her most recent blood cultures have been negative.  We discussed proceeding with bypass graft tomorrow and then doing her carotid endarterectomy once she recovers from her bypass.  She will be n.p.o. after midnight.  The risks and benefits of surgery were discussed with the patient.  Wells Brabham 04/06/2017 2:41 PM --  Vitals:   04/05/17 2222 04/06/17 0615  BP: (!) 145/80 (!) 156/66  Pulse: 91 83  Resp: 18 16  Temp:  97.8 F (36.6 C)  SpO2: 92% 93%    Intake/Output Summary (Last 24 hours) at 04/06/2017 1441 Last data filed at 04/06/2017 0900 Gross per 24 hour  Intake 280 ml  Output -  Net 280 ml     Laboratory CBC    Component Value Date/Time   WBC 6.4 04/06/2017 1019   HGB 14.7 04/06/2017 1019   HCT 45.4 04/06/2017 1019   PLT 130 (L) 04/06/2017 1019    BMET    Component Value Date/Time   NA 132 (L) 04/06/2017 1019   K 4.0 04/06/2017 1019   CL 95 (L) 04/06/2017 1019   CO2 24 04/06/2017 1019   GLUCOSE 106 (H) 04/06/2017 1019   BUN 6 04/06/2017 1019   CREATININE 0.53 04/06/2017 1019   CREATININE 0.69 01/28/2013 1110   CALCIUM 9.6  04/06/2017 1019   GFRNONAA >60 04/06/2017 1019   GFRAA >60 04/06/2017 1019    COAG Lab Results  Component Value Date   INR 1.06 03/30/2017   No results found for: PTT  Antibiotics Anti-infectives (From admission, onward)   Start     Dose/Rate Route Frequency Ordered Stop   04/05/17 0400  cefTRIAXone (ROCEPHIN) 2 g in dextrose 5 % 50 mL IVPB     2 g 100 mL/hr over 30 Minutes Intravenous Every 24 hours 04/04/17 2111     04/04/17 2000  ceFEPIme (MAXIPIME) 2 g in dextrose 5 % 50 mL IVPB  Status:  Discontinued     2 g 100 mL/hr over 30 Minutes Intravenous Every 8 hours 04/04/17 1959 04/04/17 2111       V. Leia Alf, M.D. [facility] Office: [phone] Pager:  [phone]

## 2017-04-06 NOTE — H&P (View-Only) (Signed)
    Subjective  -   Having pain in the right foot.   Physical Exam:  Slight progression of the necrosis on her toe.  No significant residual erythema on the foot. Palpable femoral pulse, no palpable pedal pulse on the right Psoriatic plaque throughout the right leg       Assessment/Plan:   The patient was originally scheduled for carotid endarterectomy tomorrow.  I had originally planned to do her carotid endarterectomy prior to her bypass given the greater than 80% stenosis.  However, the patient was admitted with positive blood cultures and cellulitis in the right leg.  Because she is having progression of the ischemic changes with infection to her right leg, I told the patient that we should revise our plan and proceed with right femoral-popliteal bypass graft.  She does have an adequate vein to service conduit for bypass.  Her most recent blood cultures have been negative.  We discussed proceeding with bypass graft tomorrow and then doing her carotid endarterectomy once she recovers from her bypass.  She will be n.p.o. after midnight.  The risks and benefits of surgery were discussed with the patient.  Wells Aspen Lawrance 04/06/2017 2:41 PM --  Vitals:   04/05/17 2222 04/06/17 0615  BP: (!) 145/80 (!) 156/66  Pulse: 91 83  Resp: 18 16  Temp:  97.8 F (36.6 C)  SpO2: 92% 93%    Intake/Output Summary (Last 24 hours) at 04/06/2017 1441 Last data filed at 04/06/2017 0900 Gross per 24 hour  Intake 280 ml  Output -  Net 280 ml     Laboratory CBC    Component Value Date/Time   WBC 6.4 04/06/2017 1019   HGB 14.7 04/06/2017 1019   HCT 45.4 04/06/2017 1019   PLT 130 (L) 04/06/2017 1019    BMET    Component Value Date/Time   NA 132 (L) 04/06/2017 1019   K 4.0 04/06/2017 1019   CL 95 (L) 04/06/2017 1019   CO2 24 04/06/2017 1019   GLUCOSE 106 (H) 04/06/2017 1019   BUN 6 04/06/2017 1019   CREATININE 0.53 04/06/2017 1019   CREATININE 0.69 01/28/2013 1110   CALCIUM 9.6  04/06/2017 1019   GFRNONAA >60 04/06/2017 1019   GFRAA >60 04/06/2017 1019    COAG Lab Results  Component Value Date   INR 1.06 03/30/2017   No results found for: PTT  Antibiotics Anti-infectives (From admission, onward)   Start     Dose/Rate Route Frequency Ordered Stop   04/05/17 0400  cefTRIAXone (ROCEPHIN) 2 g in dextrose 5 % 50 mL IVPB     2 g 100 mL/hr over 30 Minutes Intravenous Every 24 hours 04/04/17 2111     04/04/17 2000  ceFEPIme (MAXIPIME) 2 g in dextrose 5 % 50 mL IVPB  Status:  Discontinued     2 g 100 mL/hr over 30 Minutes Intravenous Every 8 hours 04/04/17 1959 04/04/17 2111       V. Leia Alf, M.D. [facility] [place] Office: [phone] Pager:  [phone]

## 2017-04-07 ENCOUNTER — Encounter (HOSPITAL_COMMUNITY)
Admission: EM | Disposition: A | Discharge status: Home Health | Payer: Self-pay | Source: Home / Self Care | Attending: Internal Medicine

## 2017-04-07 ENCOUNTER — Encounter (HOSPITAL_COMMUNITY): Payer: Self-pay | Admitting: Anesthesiology

## 2017-04-07 ENCOUNTER — Inpatient Hospital Stay (HOSPITAL_COMMUNITY): Payer: BLUE CROSS/BLUE SHIELD | Admitting: Anesthesiology

## 2017-04-07 ENCOUNTER — Inpatient Hospital Stay (HOSPITAL_COMMUNITY): Admission: RE | Admit: 2017-04-07 | Payer: BLUE CROSS/BLUE SHIELD | Source: Ambulatory Visit | Admitting: Surgery

## 2017-04-07 DIAGNOSIS — I998 Other disorder of circulatory system: Secondary | ICD-10-CM

## 2017-04-07 HISTORY — PX: FEMORAL-POPLITEAL BYPASS GRAFT: SHX937

## 2017-04-07 LAB — TYPE AND SCREEN
ABO/RH(D): O POS
Antibody Screen: NEGATIVE

## 2017-04-07 LAB — CBC
HCT: 45.2 % (ref 36.0–46.0)
HEMATOCRIT: 46.1 % — AB (ref 36.0–46.0)
HEMOGLOBIN: 14.9 g/dL (ref 12.0–15.0)
Hemoglobin: 15.4 g/dL — ABNORMAL HIGH (ref 12.0–15.0)
MCH: 27.5 pg (ref 26.0–34.0)
MCH: 27.4 pg (ref 26.0–34.0)
MCHC: 33.4 g/dL (ref 30.0–36.0)
MCHC: 33 g/dL (ref 30.0–36.0)
MCV: 82.5 fL (ref 78.0–100.0)
MCV: 83.1 fL (ref 78.0–100.0)
PLATELETS: 107 10*3/uL — AB (ref 150–400)
PLATELETS: 134 10*3/uL — AB (ref 150–400)
RBC: 5.59 MIL/uL — ABNORMAL HIGH (ref 3.87–5.11)
RBC: 5.44 MIL/uL — AB (ref 3.87–5.11)
RDW: 15.2 % (ref 11.5–15.5)
RDW: 14.9 % (ref 11.5–15.5)
WBC: 6.2 10*3/uL (ref 4.0–10.5)
WBC: 5.8 10*3/uL (ref 4.0–10.5)

## 2017-04-07 LAB — COMPREHENSIVE METABOLIC PANEL
ALBUMIN: 3.8 g/dL (ref 3.5–5.0)
ALK PHOS: 69 U/L (ref 38–126)
ALT: 27 U/L (ref 14–54)
AST: 33 U/L (ref 15–41)
Anion gap: 13 (ref 5–15)
BUN: 6 mg/dL (ref 6–20)
CALCIUM: 9.5 mg/dL (ref 8.9–10.3)
CHLORIDE: 95 mmol/L — AB (ref 101–111)
CO2: 26 mmol/L (ref 22–32)
CREATININE: 0.51 mg/dL (ref 0.44–1.00)
GFR calc non Af Amer: >60 mL/min (ref >60)
GLUCOSE: 126 mg/dL — AB (ref 65–99)
Potassium: 4.1 mmol/L (ref 3.5–5.1)
SODIUM: 134 mmol/L — AB (ref 135–145)
Total Bilirubin: 0.6 mg/dL (ref 0.3–1.2)
Total Protein: 6.9 g/dL (ref 6.5–8.1)

## 2017-04-07 LAB — SURGICAL PCR SCREEN
MRSA, PCR: NEGATIVE
Staphylococcus aureus: NEGATIVE

## 2017-04-07 LAB — APTT: aPTT: 39 seconds — ABNORMAL HIGH (ref 24–36)

## 2017-04-07 LAB — GLUCOSE, CAPILLARY
GLUCOSE-CAPILLARY: 174 mg/dL — AB (ref 65–99)
Glucose-Capillary: 107 mg/dL — ABNORMAL HIGH (ref 65–99)
Glucose-Capillary: 127 mg/dL — ABNORMAL HIGH (ref 65–99)
Glucose-Capillary: 222 mg/dL — ABNORMAL HIGH (ref 65–99)
Glucose-Capillary: 226 mg/dL — ABNORMAL HIGH (ref 65–99)

## 2017-04-07 LAB — BASIC METABOLIC PANEL
Anion gap: 15 (ref 5–15)
BUN: 8 mg/dL (ref 6–20)
CHLORIDE: 94 mmol/L — AB (ref 101–111)
CO2: 25 mmol/L (ref 22–32)
CREATININE: 0.48 mg/dL (ref 0.44–1.00)
Calcium: 9.4 mg/dL (ref 8.9–10.3)
GFR calc Af Amer: >60 mL/min (ref >60)
GFR calc non Af Amer: >60 mL/min (ref >60)
GLUCOSE: 104 mg/dL — AB (ref 65–99)
POTASSIUM: 4.3 mmol/L (ref 3.5–5.1)
Sodium: 134 mmol/L — ABNORMAL LOW (ref 135–145)

## 2017-04-07 LAB — ABO/RH: ABO/RH(D): O POS

## 2017-04-07 LAB — PROTIME-INR
INR: 1.02
Prothrombin Time: 13.3 seconds (ref 11.4–15.2)

## 2017-04-07 SURGERY — BYPASS GRAFT FEMORAL-POPLITEAL ARTERY
Anesthesia: General | Site: Leg Lower | Laterality: Right

## 2017-04-07 MED ORDER — ALBUMIN HUMAN 5 % IV SOLN
INTRAVENOUS | Status: DC | PRN
  Administered 2017-04-07 (×2): via INTRAVENOUS

## 2017-04-07 MED ORDER — VANCOMYCIN HCL IN DEXTROSE 1-5 GM/200ML-% IV SOLN
INTRAVENOUS | Status: AC
  Filled 2017-04-07: qty 200

## 2017-04-07 MED ORDER — DEXAMETHASONE SODIUM PHOSPHATE 10 MG/ML IJ SOLN
INTRAMUSCULAR | Status: DC | PRN
  Administered 2017-04-07: 6 mg via INTRAVENOUS

## 2017-04-07 MED ORDER — SODIUM CHLORIDE 0.9 % IV SOLN
INTRAVENOUS | Status: DC
  Administered 2017-04-07 – 2017-04-09 (×3): via INTRAVENOUS

## 2017-04-07 MED ORDER — LABETALOL HCL 5 MG/ML IV SOLN: 10.0000 mg | INTRAVENOUS | Status: DC | PRN

## 2017-04-07 MED ORDER — VANCOMYCIN HCL IN DEXTROSE 1-5 GM/200ML-% IV SOLN
1000.0000 mg | INTRAVENOUS | Status: AC
  Administered 2017-04-07: 1000 mg via INTRAVENOUS

## 2017-04-07 MED ORDER — MIDAZOLAM HCL 5 MG/5ML IJ SOLN
INTRAMUSCULAR | Status: DC | PRN
  Administered 2017-04-07: 2 mg via INTRAVENOUS

## 2017-04-07 MED ORDER — DEXAMETHASONE SODIUM PHOSPHATE 10 MG/ML IJ SOLN
INTRAMUSCULAR | Status: AC
  Filled 2017-04-07: qty 1

## 2017-04-07 MED ORDER — PROPOFOL 10 MG/ML IV BOLUS
INTRAVENOUS | Status: AC
  Filled 2017-04-07: qty 20

## 2017-04-07 MED ORDER — FENTANYL CITRATE (PF) 250 MCG/5ML IJ SOLN
INTRAMUSCULAR | Status: DC | PRN
  Administered 2017-04-07 (×2): 50 ug via INTRAVENOUS
  Administered 2017-04-07: 100 ug via INTRAVENOUS
  Administered 2017-04-07 (×6): 50 ug via INTRAVENOUS
  Administered 2017-04-07: 150 ug via INTRAVENOUS
  Administered 2017-04-07 (×2): 50 ug via INTRAVENOUS

## 2017-04-07 MED ORDER — PROPOFOL 10 MG/ML IV BOLUS
INTRAVENOUS | Status: DC | PRN
  Administered 2017-04-07: 130 mg via INTRAVENOUS

## 2017-04-07 MED ORDER — SODIUM CHLORIDE 0.9 % IV SOLN
INTRAVENOUS | Status: DC | PRN
  Administered 2017-04-07: 500 mL

## 2017-04-07 MED ORDER — HYDROMORPHONE HCL 1 MG/ML IJ SOLN
0.2500 mg | INTRAMUSCULAR | Status: DC | PRN
  Administered 2017-04-07: 0.5 mg via INTRAVENOUS

## 2017-04-07 MED ORDER — GUAIFENESIN-DM 100-10 MG/5ML PO SYRP
15.0000 mL | ORAL_SOLUTION | ORAL | Status: DC | PRN
  Administered 2017-04-09: 15 mL via ORAL
  Filled 2017-04-07: qty 15

## 2017-04-07 MED ORDER — HEMOSTATIC AGENTS (NO CHARGE) OPTIME
TOPICAL | Status: DC | PRN
  Administered 2017-04-07: 1 via TOPICAL

## 2017-04-07 MED ORDER — 0.9 % SODIUM CHLORIDE (POUR BTL) OPTIME
TOPICAL | Status: DC | PRN
  Administered 2017-04-07: 1000 mL
  Administered 2017-04-07: 2000 mL

## 2017-04-07 MED ORDER — MIDAZOLAM HCL 2 MG/2ML IJ SOLN
INTRAMUSCULAR | Status: AC
  Filled 2017-04-07: qty 2

## 2017-04-07 MED ORDER — ONDANSETRON HCL 4 MG/2ML IJ SOLN
INTRAMUSCULAR | Status: DC | PRN
  Administered 2017-04-07: 4 mg via INTRAVENOUS

## 2017-04-07 MED ORDER — HEPARIN SODIUM (PORCINE) 5000 UNIT/ML IJ SOLN
5000.0000 [IU] | Freq: Three times a day (TID) | INTRAMUSCULAR | Status: DC
  Administered 2017-04-07 – 2017-04-08 (×2): 5000 [IU] via SUBCUTANEOUS
  Filled 2017-04-07 (×2): qty 1

## 2017-04-07 MED ORDER — PHENOL 1.4 % MT LIQD
1.0000 | OROMUCOSAL | Status: DC | PRN
Start: 2017-04-07 — End: 2017-04-10

## 2017-04-07 MED ORDER — PNEUMOCOCCAL VAC POLYVALENT 25 MCG/0.5ML IJ INJ: 0.5000 mL | INJECTION | INTRAMUSCULAR | Status: DC | PRN

## 2017-04-07 MED ORDER — PROTAMINE SULFATE 10 MG/ML IV SOLN
INTRAVENOUS | Status: DC | PRN
  Administered 2017-04-07: 50 mg via INTRAVENOUS

## 2017-04-07 MED ORDER — HEPARIN SODIUM (PORCINE) 1000 UNIT/ML IJ SOLN
INTRAMUSCULAR | Status: DC | PRN
  Administered 2017-04-07: 2000 [IU] via INTRAVENOUS
  Administered 2017-04-07: 10000 [IU] via INTRAVENOUS
  Administered 2017-04-07: 2000 [IU] via INTRAVENOUS
  Administered 2017-04-07: 3000 [IU] via INTRAVENOUS

## 2017-04-07 MED ORDER — VECURONIUM BROMIDE 10 MG IV SOLR
INTRAVENOUS | Status: DC | PRN
  Administered 2017-04-07: 2 mg via INTRAVENOUS
  Administered 2017-04-07: 5 mg via INTRAVENOUS
  Administered 2017-04-07: 3 mg via INTRAVENOUS

## 2017-04-07 MED ORDER — METOPROLOL TARTRATE 5 MG/5ML IV SOLN: 2.0000 mg | INTRAVENOUS | Status: DC | PRN

## 2017-04-07 MED ORDER — PANTOPRAZOLE SODIUM 40 MG PO TBEC
40.0000 mg | DELAYED_RELEASE_TABLET | Freq: Every day | ORAL | Status: DC
  Administered 2017-04-08 – 2017-04-10 (×3): 40 mg via ORAL
  Filled 2017-04-07 (×3): qty 1

## 2017-04-07 MED ORDER — SODIUM CHLORIDE 0.9 % IV SOLN: INTRAVENOUS | Status: DC

## 2017-04-07 MED ORDER — LIDOCAINE 2% (20 MG/ML) 5 ML SYRINGE
INTRAMUSCULAR | Status: DC | PRN
  Administered 2017-04-07: 100 mg via INTRAVENOUS

## 2017-04-07 MED ORDER — ROCURONIUM BROMIDE 10 MG/ML (PF) SYRINGE
PREFILLED_SYRINGE | INTRAVENOUS | Status: DC | PRN
  Administered 2017-04-07 (×2): 30 mg via INTRAVENOUS
  Administered 2017-04-07: 20 mg via INTRAVENOUS
  Administered 2017-04-07 (×2): 30 mg via INTRAVENOUS
  Administered 2017-04-07: 50 mg via INTRAVENOUS

## 2017-04-07 MED ORDER — ALBUTEROL SULFATE (2.5 MG/3ML) 0.083% IN NEBU
INHALATION_SOLUTION | RESPIRATORY_TRACT | Status: AC
  Administered 2017-04-07: 2.5 mg via RESPIRATORY_TRACT
  Filled 2017-04-07: qty 3

## 2017-04-07 MED ORDER — HYDRALAZINE HCL 20 MG/ML IJ SOLN: 5.0000 mg | INTRAMUSCULAR | Status: DC | PRN

## 2017-04-07 MED ORDER — CHLORHEXIDINE GLUCONATE 4 % EX LIQD: 60.0000 mL | Freq: Once | CUTANEOUS | Status: DC

## 2017-04-07 MED ORDER — LIDOCAINE 2% (20 MG/ML) 5 ML SYRINGE
INTRAMUSCULAR | Status: AC
  Filled 2017-04-07: qty 5

## 2017-04-07 MED ORDER — PHENYLEPHRINE HCL 10 MG/ML IJ SOLN
INTRAMUSCULAR | Status: DC | PRN
  Administered 2017-04-07: 80 ug via INTRAVENOUS
  Administered 2017-04-07 (×2): 120 ug via INTRAVENOUS

## 2017-04-07 MED ORDER — SUGAMMADEX SODIUM 200 MG/2ML IV SOLN
INTRAVENOUS | Status: DC | PRN
  Administered 2017-04-07: 200 mg via INTRAVENOUS

## 2017-04-07 MED ORDER — IPRATROPIUM-ALBUTEROL 0.5-2.5 (3) MG/3ML IN SOLN
RESPIRATORY_TRACT | Status: AC
  Administered 2017-04-07: 3 mL via RESPIRATORY_TRACT
  Filled 2017-04-07: qty 3

## 2017-04-07 MED ORDER — ALBUTEROL SULFATE (2.5 MG/3ML) 0.083% IN NEBU
2.5000 mg | INHALATION_SOLUTION | Freq: Four times a day (QID) | RESPIRATORY_TRACT | Status: AC | PRN
  Administered 2017-04-07: 2.5 mg via RESPIRATORY_TRACT

## 2017-04-07 MED ORDER — FENTANYL CITRATE (PF) 250 MCG/5ML IJ SOLN
INTRAMUSCULAR | Status: AC
  Filled 2017-04-07: qty 5

## 2017-04-07 MED ORDER — SODIUM CHLORIDE 0.9 % IN NEBU
INHALATION_SOLUTION | RESPIRATORY_TRACT | Status: AC
  Filled 2017-04-07: qty 3

## 2017-04-07 MED ORDER — HYDROMORPHONE HCL 1 MG/ML IJ SOLN
0.5000 mg | INTRAMUSCULAR | Status: DC | PRN
  Administered 2017-04-08 (×3): 0.5 mg via INTRAVENOUS
  Administered 2017-04-08: 1 mg via INTRAVENOUS
  Administered 2017-04-08: 0.5 mg via INTRAVENOUS
  Administered 2017-04-09 (×2): 1 mg via INTRAVENOUS
  Administered 2017-04-09: 0.5 mg via INTRAVENOUS
  Filled 2017-04-07 (×2): qty 0.5
  Filled 2017-04-07: qty 1
  Filled 2017-04-07: qty 0.5
  Filled 2017-04-07: qty 1
  Filled 2017-04-07 (×2): qty 0.5
  Filled 2017-04-07: qty 1
  Filled 2017-04-07: qty 0.5

## 2017-04-07 MED ORDER — PROMETHAZINE HCL 25 MG/ML IJ SOLN
6.2500 mg | INTRAMUSCULAR | Status: DC | PRN
  Administered 2017-04-07: 6.25 mg via INTRAVENOUS

## 2017-04-07 MED ORDER — HYDROMORPHONE HCL 1 MG/ML IJ SOLN
INTRAMUSCULAR | Status: AC
  Administered 2017-04-07: 0.5 mg via INTRAVENOUS
  Filled 2017-04-07: qty 1

## 2017-04-07 MED ORDER — ESMOLOL HCL 100 MG/10ML IV SOLN
INTRAVENOUS | Status: DC | PRN
  Administered 2017-04-07 (×2): 20 mg via INTRAVENOUS

## 2017-04-07 MED ORDER — IPRATROPIUM-ALBUTEROL 0.5-2.5 (3) MG/3ML IN SOLN
3.0000 mL | Freq: Four times a day (QID) | RESPIRATORY_TRACT | Status: DC
  Administered 2017-04-07 (×2): 3 mL via RESPIRATORY_TRACT
  Filled 2017-04-07: qty 3

## 2017-04-07 MED ORDER — ALBUTEROL SULFATE (2.5 MG/3ML) 0.083% IN NEBU: 2.5000 mg | INHALATION_SOLUTION | Freq: Four times a day (QID) | RESPIRATORY_TRACT | Status: DC | PRN

## 2017-04-07 MED ORDER — LACTATED RINGERS IV SOLN
INTRAVENOUS | Status: DC | PRN
  Administered 2017-04-07 (×4): via INTRAVENOUS

## 2017-04-07 SURGICAL SUPPLY — 79 items
ADH SKN CLS APL DERMABOND .7 (GAUZE/BANDAGES/DRESSINGS) ×1
APL SKNCLS STERI-STRIP NONHPOA (GAUZE/BANDAGES/DRESSINGS) ×2
BANDAGE ESMARK 6X9 LF (GAUZE/BANDAGES/DRESSINGS) IMPLANT
BENZOIN TINCTURE PRP APPL 2/3 (GAUZE/BANDAGES/DRESSINGS) ×2 IMPLANT
BLADE 11 SAFETY STRL DISP (BLADE) ×1 IMPLANT
BNDG CMPR 9X6 STRL LF SNTH (GAUZE/BANDAGES/DRESSINGS) ×1
BNDG ESMARK 6X9 LF (GAUZE/BANDAGES/DRESSINGS) ×2
CANISTER SUCT 3000ML PPV (MISCELLANEOUS) ×2 IMPLANT
CANNULA VESSEL 3MM 2 BLNT TIP (CANNULA) ×3 IMPLANT
CATH EMB 4FR 80CM (CATHETERS) ×1 IMPLANT
CLIP TI WIDE RED SMALL 6 (CLIP) ×1 IMPLANT
CLIP VESOCCLUDE MED 24/CT (CLIP) ×3 IMPLANT
CLIP VESOCCLUDE SM WIDE 24/CT (CLIP) ×3 IMPLANT
COVER PROBE W GEL 5X96 (DRAPES) ×1 IMPLANT
COVER SURGICAL LIGHT HANDLE (MISCELLANEOUS) ×2 IMPLANT
CUFF TOURNIQUET SINGLE 24IN (TOURNIQUET CUFF) IMPLANT
CUFF TOURNIQUET SINGLE 34IN LL (TOURNIQUET CUFF) IMPLANT
CUFF TOURNIQUET SINGLE 44IN (TOURNIQUET CUFF) ×1 IMPLANT
DERMABOND ADVANCED (GAUZE/BANDAGES/DRESSINGS) ×1
DERMABOND ADVANCED .7 DNX12 (GAUZE/BANDAGES/DRESSINGS) ×1 IMPLANT
DRAIN CHANNEL 15F RND FF W/TCR (WOUND CARE) IMPLANT
DRAPE HALF SHEET 40X57 (DRAPES) IMPLANT
DRAPE X-RAY CASS 24X20 (DRAPES) IMPLANT
DRESSING PREVENA PLUS CUSTOM (GAUZE/BANDAGES/DRESSINGS) IMPLANT
DRSG PREVENA PLUS CUSTOM (GAUZE/BANDAGES/DRESSINGS) ×2
ELECT REM PT RETURN 9FT ADLT (ELECTROSURGICAL) ×2
ELECTRODE REM PT RTRN 9FT ADLT (ELECTROSURGICAL) ×1 IMPLANT
EVACUATOR SILICONE 100CC (DRAIN) IMPLANT
GAUZE SPONGE 4X4 16PLY XRAY LF (GAUZE/BANDAGES/DRESSINGS) ×1 IMPLANT
GLOVE BIO SURGEON STRL SZ 6.5 (GLOVE) ×1 IMPLANT
GLOVE BIO SURGEON STRL SZ7 (GLOVE) ×1 IMPLANT
GLOVE BIOGEL PI IND STRL 7.5 (GLOVE) ×1 IMPLANT
GLOVE BIOGEL PI INDICATOR 7.5 (GLOVE) ×5
GLOVE SURG SS PI 6.0 STRL IVOR (GLOVE) ×3 IMPLANT
GLOVE SURG SS PI 6.5 STRL IVOR (GLOVE) ×1 IMPLANT
GLOVE SURG SS PI 7.5 STRL IVOR (GLOVE) ×5 IMPLANT
GOWN STRL REUS W/ TWL LRG LVL3 (GOWN DISPOSABLE) ×2 IMPLANT
GOWN STRL REUS W/ TWL XL LVL3 (GOWN DISPOSABLE) ×1 IMPLANT
GOWN STRL REUS W/TWL LRG LVL3 (GOWN DISPOSABLE) ×8
GOWN STRL REUS W/TWL XL LVL3 (GOWN DISPOSABLE) ×10
HEMOSTAT SNOW SURGICEL 2X4 (HEMOSTASIS) ×1 IMPLANT
INSERT FOGARTY SM (MISCELLANEOUS) IMPLANT
KIT BASIN OR (CUSTOM PROCEDURE TRAY) ×2 IMPLANT
KIT ROOM TURNOVER OR (KITS) ×2 IMPLANT
MARKER GRAFT CORONARY BYPASS (MISCELLANEOUS) IMPLANT
NS IRRIG 1000ML POUR BTL (IV SOLUTION) ×4 IMPLANT
PACK PERIPHERAL VASCULAR (CUSTOM PROCEDURE TRAY) ×2 IMPLANT
PAD ARMBOARD 7.5X6 YLW CONV (MISCELLANEOUS) ×4 IMPLANT
RELOAD ENDO GIA 45X2.5 (ENDOMECHANICALS) ×1 IMPLANT
SET COLLECT BLD 21X3/4 12 (NEEDLE) IMPLANT
SPONGE LAP 18X18 X RAY DECT (DISPOSABLE) ×3 IMPLANT
STOPCOCK 4 WAY LG BORE MALE ST (IV SETS) ×1 IMPLANT
SUT ETHILON 3 0 PS 1 (SUTURE) IMPLANT
SUT GORETEX 6.0 TT13 (SUTURE) IMPLANT
SUT GORETEX 6.0 TT9 (SUTURE) IMPLANT
SUT PROLENE 5 0 C 1 24 (SUTURE) ×11 IMPLANT
SUT PROLENE 6 0 BV (SUTURE) ×6 IMPLANT
SUT PROLENE 7 0 BV 1 (SUTURE) IMPLANT
SUT PROLENE 7 0 BV1 MDA (SUTURE) ×1 IMPLANT
SUT SILK 2 0 (SUTURE) ×2
SUT SILK 2 0 SH (SUTURE) ×3 IMPLANT
SUT SILK 2-0 18XBRD TIE 12 (SUTURE) IMPLANT
SUT SILK 3 0 (SUTURE) ×6
SUT SILK 3-0 18XBRD TIE 12 (SUTURE) IMPLANT
SUT VIC AB 2-0 CT1 27 (SUTURE) ×4
SUT VIC AB 2-0 CT1 TAPERPNT 27 (SUTURE) ×2 IMPLANT
SUT VIC AB 2-0 CTX 36 (SUTURE) ×4 IMPLANT
SUT VIC AB 3-0 SH 27 (SUTURE) ×8
SUT VIC AB 3-0 SH 27X BRD (SUTURE) ×2 IMPLANT
SUT VIC AB 4-0 PS2 27 (SUTURE) ×2 IMPLANT
SUT VICRYL 4-0 PS2 18IN ABS (SUTURE) ×4 IMPLANT
SYR 3ML LL SCALE MARK (SYRINGE) ×1 IMPLANT
TOWEL GREEN STERILE (TOWEL DISPOSABLE) ×2 IMPLANT
TOWEL GREEN STERILE FF (TOWEL DISPOSABLE) ×1 IMPLANT
TRAY FOLEY W/METER SILVER 16FR (SET/KITS/TRAYS/PACK) ×2 IMPLANT
TUBING EXTENTION W/L.L. (IV SETS) IMPLANT
UNDERPAD 30X30 (UNDERPADS AND DIAPERS) ×2 IMPLANT
WATER STERILE IRR 1000ML POUR (IV SOLUTION) ×2 IMPLANT
WND VAC CANISTER 500ML (MISCELLANEOUS) ×1 IMPLANT

## 2017-04-07 NOTE — Anesthesia Preprocedure Evaluation (Signed)
Anesthesia Evaluation  Patient identified by MRN, date of birth, ID band Patient awake    Reviewed: Allergy & Precautions, NPO status , Patient's Chart, lab work & pertinent test results  Airway Mallampati: II  TM Distance: >3 FB Neck ROM: Full    Dental  (+) Edentulous Upper, Edentulous Lower   Pulmonary COPD, Current Smoker,    Pulmonary exam normal breath sounds clear to auscultation       Cardiovascular hypertension, + Peripheral Vascular Disease  Normal cardiovascular exam Rhythm:Regular Rate:Normal     Neuro/Psych negative neurological ROS  negative psych ROS   GI/Hepatic negative GI ROS, Neg liver ROS,   Endo/Other  diabetesMorbid obesity  Renal/GU negative Renal ROS  negative genitourinary   Musculoskeletal negative musculoskeletal ROS (+)   Abdominal (+) + obese,   Peds negative pediatric ROS (+)  Hematology negative hematology ROS (+)   Anesthesia Other Findings   Reproductive/Obstetrics negative OB ROS                             Anesthesia Physical Anesthesia Plan  ASA: III  Anesthesia Plan: General   Post-op Pain Management:    Induction: Intravenous  PONV Risk Score and Plan: 2  Airway Management Planned: Oral ETT  Additional Equipment:   Intra-op Plan:   Post-operative Plan: Possible Post-op intubation/ventilation  Informed Consent: I have reviewed the patients History and Physical, chart, labs and discussed the procedure including the risks, benefits and alternatives for the proposed anesthesia with the patient or authorized representative who has indicated his/her understanding and acceptance.   Dental advisory given  Plan Discussed with: CRNA and Surgeon  Anesthesia Plan Comments:         Anesthesia Quick Evaluation

## 2017-04-07 NOTE — Transfer of Care (Signed)
Immediate Anesthesia Transfer of Care Note  Patient: [name]  Procedure(s) Performed: RIGHT FEMORAL-BELOW POPLITEAL ARTERY BYPASS WITH VEIN, RIGHT COMMON AND PRFUNDA FEMORAL ARTERY ENDARTERECTOMY WITH VEIN PATCH ANGIOPLASTY (Right Leg Lower)  Patient Location: PACU  Anesthesia Type:General  Level of Consciousness: awake, alert , oriented and patient cooperative  Airway & Oxygen Therapy: Patient Spontanous Breathing and Patient connected to face mask oxygen  Post-op Assessment: Report given to RN, Post -op Vital signs reviewed and stable and Patient moving all extremities X 4  Post vital signs: Reviewed and stable  Last Vitals:  Vitals:   04/07/17 0925 04/07/17 2027  BP: (!) 144/83   Pulse: 95   Resp: 12   Temp: 36.6 C (P) 36.8 C  SpO2: 94% (!) (P) 86%    Last Pain:  Vitals:   04/07/17 0925  TempSrc: Oral  PainSc: 10-Worst pain ever      Patients Stated Pain Goal: 1 (72/90/21 1155)  Complications: No apparent anesthesia complications

## 2017-04-07 NOTE — Anesthesia Procedure Notes (Signed)
Procedure Name: Intubation Date/Time: 04/07/2017 1:10 PM Performed by: Myna Bright, CRNA Pre-anesthesia Checklist: Patient identified, Emergency Drugs available, Suction available and Patient being monitored Patient Re-evaluated:Patient Re-evaluated prior to induction Oxygen Delivery Method: Circle system utilized Preoxygenation: Pre-oxygenation with 100% oxygen Induction Type: IV induction Ventilation: Mask ventilation without difficulty Laryngoscope Size: Mac and 3 Grade View: Grade I Tube type: Oral Tube size: 7.0 mm Number of attempts: 1 Airway Equipment and Method: Stylet Placement Confirmation: ETT inserted through vocal cords under direct vision,  positive ETCO2 and breath sounds checked- equal and bilateral Secured at: 21 cm Tube secured with: Tape Dental Injury: Teeth and Oropharynx as per pre-operative assessment

## 2017-04-07 NOTE — Anesthesia Postprocedure Evaluation (Signed)
Anesthesia Post Note  Patient: [name] [name]  Procedure(s) Performed: RIGHT FEMORAL-BELOW POPLITEAL ARTERY BYPASS WITH VEIN, RIGHT COMMON AND PRFUNDA FEMORAL ARTERY ENDARTERECTOMY WITH VEIN PATCH ANGIOPLASTY (Right Leg Lower)     Patient location during evaluation: PACU Anesthesia Type: General Level of consciousness: awake and awake and alert Pain management: pain level controlled Vital Signs Assessment: post-procedure vital signs reviewed and stable Respiratory status: spontaneous breathing and patient connected to face mask oxygen Cardiovascular status: blood pressure returned to baseline and tachycardic Postop Assessment: no headache Anesthetic complications: no    Last Vitals:  Vitals:   04/07/17 0925 04/07/17 2027  BP: (!) 144/83   Pulse: 95   Resp: 12   Temp: 36.6 C 36.8 C  SpO2: 94% (!) 86%    Last Pain:  Vitals:   04/07/17 2100  TempSrc:   PainSc: Asleep                 [name]

## 2017-04-07 NOTE — Interval H&P Note (Signed)
History and Physical Interval Note:  04/07/2017 12:23 PM  [name] [name]  has presented today for surgery, with the diagnosis of LEFT CAROTID STENOSIS  The various methods of treatment have been discussed with the patient and family. After consideration of risks, benefits and other options for treatment, the patient has consented to  Procedure(s): BYPASS GRAFT FEMORAL-POPLITEAL ARTERY (Right) as a surgical intervention .  The patient's history has been reviewed, patient examined, no change in status, stable for surgery.  I have reviewed the patient's chart and labs.  Questions were answered to the patient's satisfaction.     [name]

## 2017-04-07 NOTE — Progress Notes (Signed)
[facility]                                                                              Patient Demographics  [name] [name], is a 62 y.o. female, DOB - Sep 06, 1955, WIO:[id number]  Admit date - 04/04/2017   Admitting Physician Rise Patience, MD  Outpatient Primary MD for the patient is Jacelyn Pi, MD  Outpatient specialists:   LOS - 3  days   Medical records reviewed and are as summarized below:    Chief Complaint  Patient presents with  . Abnormal Lab       Brief summary    [name] [name] is a 62 y.o. female with history of peripheral vascular disease, diabetes mellitus type 2, hyperlipidemia, tobacco abuse, thrombocytopenia, hypertension who was recently admitted and discharged on January 10 for right leg cellulitis and second toe gangrene was instructed to come to the ER after patient's blood cultures drawn during that stay grew Streptococcus variance in both bottles.  Patient was discharged home on Keflex.  Patient denied any fever chills after discharge.  Vascular surgeon Dr. Trula Slade was planning to have patient's carotid surgery this week.  Assessment & Plan    Principal Problem:   Bacteremia in the setting of gangrenous second right toe and right foot cellulitis - X-ray negative for osteomyelitis - Blood cultures positive for Streptococcus meridians 2/2, 03/30/17, repeat blood cultures 1/15 negative so far - Continue IV Rocephin for now, appreciate ID recommendations - Patient had abdominal aortogram with lower extremity on 02/28/17 due to bilateral leg pain with ulcers, which had shown no significant aortoiliac occlusive disease, high-grade calcific stenotic lesions in the right common femoral artery with superficial femoral artery occlusion, occluded left superficial femoral artery with reconstitution of above-knee popliteal artery.  Was recommended bypass by vascular surgery. -CT right foot showed osteomyelitis, vascular surgery consulted  due to severe PVD, recommended femoropopliteal bypass, planned today  Active Problems:   DM (diabetes mellitus), type 2 with peripheral vascular complications (HCC) -CBGs stable, continue novoLog 70/30 to 50units BID    Hypertension -Currently stable, continue lisinopril, HCTZ, monitor creatinine  Chronic thrombocytopenia (HCC) -PLT count improving, monitor closely    PVD (peripheral vascular disease) ([place]), critical carotid stenosis -Vascular surgery following, postponed carotid surgery.   Code Status: Full CODE STATUS DVT Prophylaxis: Heparin subcu Family Communication: Discussed in detail with the patient, all imaging results, lab results explained to the patient and daughter  Disposition Plan:   Time Spent in minutes  25 minutes  Procedures:  X-ray right foot  Consultants:   ID Vascular surgery  Antimicrobials:   IV Rocephin 1/15   Medications  Scheduled Meds: . [MAR Hold] aspirin EC  81 mg Oral Daily  . [MAR Hold] atorvastatin  20 mg Oral Daily  . chlorhexidine  60 mL Topical Once   And  . [START ON 04/08/2017] chlorhexidine  60 mL Topical Once  . [MAR Hold] folic acid  1 mg Oral Daily  . [MAR Hold] gabapentin  300 mg Oral BID  . [MAR Hold] hydrochlorothiazide  25 mg Oral Daily  . [MAR Hold] insulin aspart  0-9 Units  Subcutaneous TID WC  . [MAR Hold] insulin aspart protamine- aspart  50 Units Subcutaneous BID  . [MAR Hold] ipratropium-albuterol  3 mL Nebulization Q6H  . [MAR Hold] lisinopril  5 mg Oral Daily  . [MAR Hold] pneumococcal 23 valent vaccine  0.5 mL Intramuscular Tomorrow-1000  . [MAR Hold] sertraline  100 mg Oral Daily   Continuous Infusions: . sodium chloride    . [MAR Hold] cefTRIAXone (ROCEPHIN)  IV Stopped (04/07/17 0521)   PRN Meds:.0.9 % irrigation (POUR BTL), [MAR Hold] acetaminophen **OR** [MAR Hold] acetaminophen, heparin irrigation 6000 unit, [MAR Hold] ondansetron **OR** [MAR Hold] ondansetron (ZOFRAN) IV, [MAR Hold]  oxyCODONE-acetaminophen   Antibiotics   Anti-infectives (From admission, onward)   Start     Dose/Rate Route Frequency Ordered Stop   04/07/17 1110  vancomycin (VANCOCIN) 1-5 GM/200ML-% IVPB    Comments:  Schonewitz, Leigh   : cabinet override      04/07/17 1110 04/07/17 1315   04/07/17 1103  vancomycin (VANCOCIN) IVPB 1000 mg/200 mL premix     1,000 mg 200 mL/hr over 60 Minutes Intravenous 60 min pre-op 04/07/17 1103 04/07/17 1415   04/05/17 0400  [MAR Hold]  cefTRIAXone (ROCEPHIN) 2 g in dextrose 5 % 50 mL IVPB     (MAR Hold since 04/07/17 1056)   2 g 100 mL/hr over 30 Minutes Intravenous Every 24 hours 04/04/17 2111     04/04/17 2000  ceFEPIme (MAXIPIME) 2 g in dextrose 5 % 50 mL IVPB  Status:  Discontinued     2 g 100 mL/hr over 30 Minutes Intravenous Every 8 hours 04/04/17 1959 04/04/17 2111        Subjective:   [name] [name] was seen and examined today.  Plan for vascular surgery today.  Patient has no complaints. Patient denies dizziness, chest pain, shortness of breath, abdominal pain, N/V/D/C, new weakness, numbess, tingling.  No fever or chills.  Objective:   Vitals:   04/06/17 2003 04/07/17 0500 04/07/17 0914 04/07/17 0925  BP: (!) 156/73 116/67  (!) 144/83  Pulse: (!) 104 83  95  Resp: 16 15  12   Temp: 97.7 F (36.5 C) 97.8 F (36.6 C)  97.8 F (36.6 C)  TempSrc: Oral Oral  Oral  SpO2: 93% 91% 92% 94%  Weight:      Height:        Intake/Output Summary (Last 24 hours) at 04/07/2017 1421 Last data filed at 04/07/2017 1409 Gross per 24 hour  Intake 0 ml  Output 325 ml  Net -325 ml     Wt Readings from Last 3 Encounters:  04/05/17 116.4 kg (256 lb 9.9 oz)  03/29/17 119.3 kg (263 lb)  03/13/17 119.3 kg (263 lb)     Exam   General: Alert and oriented x 3, NAD  Eyes:   HEENT:    Cardiovascular: S1 S2 auscultated, Regular rate and rhythm. No pedal edema b/l  Respiratory: Clear to auscultation bilaterally, no wheezing, rales or  rhonchi  Gastrointestinal: Soft, nontender, nondistended, + bowel sounds  Ext: no pedal edema bilaterally  Neuro: no new deficit  Musculoskeletal: No digital cyanosis, clubbing  Skin: right Foot cellulitis with dry gangrene of second toe, no drainage  Psych: Normal affect and demeanor, alert and oriented x3   Data Reviewed:  I have personally reviewed following labs and imaging studies  Micro Results Recent Results (from the past 240 hour(s))  Culture, blood (x 2)     Status: Abnormal   Collection Time: 03/30/17  5:25  AM  Result Value Ref Range Status   Specimen Description BLOOD WRIST RIGHT  Final   Special Requests   Final    BOTTLES DRAWN AEROBIC AND ANAEROBIC Blood Culture adequate volume   Culture  Setup Time   Final    GRAM POSITIVE COCCI AEROBIC BOTTLE ONLY CRITICAL VALUE NOTED.  VALUE IS CONSISTENT WITH PREVIOUSLY REPORTED AND CALLED VALUE.    Culture (A)  Final    VIRIDANS STREPTOCOCCUS SUSCEPTIBILITIES PERFORMED ON PREVIOUS CULTURE WITHIN THE LAST 5 DAYS.    Report Status 04/03/2017 FINAL  Final  Culture, blood (x 2)     Status: Abnormal   Collection Time: 03/30/17  5:32 AM  Result Value Ref Range Status   Specimen Description BLOOD LEFT ANTECUBITAL  Final   Special Requests IN PEDIATRIC BOTTLE Blood Culture adequate volume  Final   Culture  Setup Time   Final    GRAM POSITIVE COCCI IN CHAINS IN PEDIATRIC BOTTLE CRITICAL RESULT CALLED TO, READ BACK BY AND VERIFIED WITH: S. COBLE RN 03/31/17 0035 BEAMJ    Culture VIRIDANS STREPTOCOCCUS (A)  Final   Report Status 04/03/2017 FINAL  Final   Organism ID, Bacteria VIRIDANS STREPTOCOCCUS  Final      Susceptibility   Viridans streptococcus - MIC*    PENICILLIN <=0.06 SENSITIVE Sensitive     CEFTRIAXONE <=0.12 SENSITIVE Sensitive     ERYTHROMYCIN 2 RESISTANT Resistant     LEVOFLOXACIN 1 SENSITIVE Sensitive     VANCOMYCIN 1 SENSITIVE Sensitive     * VIRIDANS STREPTOCOCCUS  Blood Culture ID Panel (Reflexed)      Status: Abnormal   Collection Time: 03/30/17  5:32 AM  Result Value Ref Range Status   Enterococcus species NOT DETECTED NOT DETECTED Final   Listeria monocytogenes NOT DETECTED NOT DETECTED Final   Staphylococcus species NOT DETECTED NOT DETECTED Final   Staphylococcus aureus NOT DETECTED NOT DETECTED Final   Streptococcus species DETECTED (A) NOT DETECTED Final    Comment: Not Enterococcus species, Streptococcus agalactiae, Streptococcus pyogenes, or Streptococcus pneumoniae. CRITICAL RESULT CALLED TO, READ BACK BY AND VERIFIED WITH: S. COBLE RN 03/31/17 0035 BEAMJ    Streptococcus agalactiae NOT DETECTED NOT DETECTED Final   Streptococcus pneumoniae NOT DETECTED NOT DETECTED Final   Streptococcus pyogenes NOT DETECTED NOT DETECTED Final   Acinetobacter baumannii NOT DETECTED NOT DETECTED Final   Enterobacteriaceae species NOT DETECTED NOT DETECTED Final   Enterobacter cloacae complex NOT DETECTED NOT DETECTED Final   Escherichia coli NOT DETECTED NOT DETECTED Final   Klebsiella oxytoca NOT DETECTED NOT DETECTED Final   Klebsiella pneumoniae NOT DETECTED NOT DETECTED Final   Proteus species NOT DETECTED NOT DETECTED Final   Serratia marcescens NOT DETECTED NOT DETECTED Final   Haemophilus influenzae NOT DETECTED NOT DETECTED Final   Neisseria meningitidis NOT DETECTED NOT DETECTED Final   Pseudomonas aeruginosa NOT DETECTED NOT DETECTED Final   Candida albicans NOT DETECTED NOT DETECTED Final   Candida glabrata NOT DETECTED NOT DETECTED Final   Candida krusei NOT DETECTED NOT DETECTED Final   Candida parapsilosis NOT DETECTED NOT DETECTED Final   Candida tropicalis NOT DETECTED NOT DETECTED Final  Culture, blood (routine x 2)     Status: None (Preliminary result)   Collection Time: 04/04/17  8:06 PM  Result Value Ref Range Status   Specimen Description BLOOD RIGHT ANTECUBITAL  Final   Special Requests   Final    BOTTLES DRAWN AEROBIC AND ANAEROBIC Blood Culture adequate volume    Culture NO  GROWTH 3 DAYS  Final   Report Status PENDING  Incomplete  Culture, blood (routine x 2)     Status: None (Preliminary result)   Collection Time: 04/04/17  9:52 PM  Result Value Ref Range Status   Specimen Description BLOOD LEFT ANTECUBITAL  Final   Special Requests   Final    BOTTLES DRAWN AEROBIC AND ANAEROBIC Blood Culture adequate volume   Culture NO GROWTH 3 DAYS  Final   Report Status PENDING  Incomplete  Surgical pcr screen     Status: None   Collection Time: 04/07/17 10:42 AM  Result Value Ref Range Status   MRSA, PCR NEGATIVE NEGATIVE Final   Staphylococcus aureus NEGATIVE NEGATIVE Final    Comment: (NOTE) The Xpert SA Assay (FDA approved for NASAL specimens in patients 57 years of age and older), is one component of a comprehensive surveillance program. It is not intended to diagnose infection nor to guide or monitor treatment.     Radiology Reports Ct Angio Head W Or Wo Contrast  Result Date: 03/20/2017 CLINICAL DATA:  62 year old female with evidence of left carotid short segment occlusion or near occlusion, and high-grade right ICA stenosis on carotid Doppler ultrasound 03/08/2017. Subsequent encounter. EXAM: CT ANGIOGRAPHY HEAD AND NECK TECHNIQUE: Multidetector CT imaging of the head and neck was performed using the standard protocol during bolus administration of intravenous contrast. Multiplanar CT image reconstructions and MIPs were obtained to evaluate the vascular anatomy. Carotid stenosis measurements (when applicable) are obtained utilizing NASCET criteria, using the distal internal carotid diameter as the denominator. CONTRAST:  40mL ISOVUE-370 IOPAMIDOL (ISOVUE-370) INJECTION 76% COMPARISON:  Report of carotid Doppler ultrasound 03/08/2017. Head CT and cervical spine CT without contrast 06/05/2012. FINDINGS: CT HEAD Brain: Cerebral volume is stable since 2014 and normal for age. Mild for age scattered cerebral white matter hypodensity. No midline shift,  ventriculomegaly, mass effect, evidence of mass lesion, intracranial hemorrhage or evidence of cortically based acute infarction. No cortical encephalomalacia identified. Calvarium and skull base: Negative; mild motion artifact at the skullbase. Paranasal sinuses: Clear. Orbits: Visualized orbits and scalp soft tissues are within normal limits. CTA NECK Skeleton: Absent dentition aside from impacted left maxillary wisdom tooth. No acute osseous abnormality identified. Upper chest: Centrilobular emphysema suspected. Otherwise negative visible lung parenchyma. No superior mediastinal lymphadenopathy. Other neck: Subcentimeter hypodense right anterior thyroid lobe nodule on series 5, image 84 does not meet consensus criteria for ultrasound follow-up. The glottis is closed and there is intermittent motion artifact in the pharynx. No neck mass or lymphadenopathy identified. Aortic arch: Calcified aortic atherosclerosis. Four vessel arch configuration, the left vertebral artery appears to arise directly from the arch (series 5, image 108). Right carotid system: No brachiocephalic artery or right CCA origin stenosis despite calcified plaque. The right CCA is then negative until the right carotid bifurcation where bulky calcified plaque occurs affecting both the right ICA and ECA origins. High-grade right ICA origin stenosis results, numerically estimated at 75 % with respect to the distal vessel , and might be higher owing to the complex configuration of what appears to be both calcified and soft plaque on series 13, image 175. The cervical right ICA remains patent and has a normal caliber distally. Left carotid system: No left CCA origin stenosis despite calcified plaque. There is some circumferential soft plaque in the left CCA proximal to the bifurcation without stenosis. Bulky calcified plaque at the left carotid bifurcation continues into the left ICA origin and bulb with what appears essentially as  a radiographic  string sign stenosis over a 13-14 mm segment of the vessel (series 12, image 138. The downstream left ICA caliber is diminished to about 3 mm diameter, but no bona fide occlusion of the vessel is identified. Vertebral arteries: No proximal right subclavian artery stenosis despite calcified plaque. Calcified plaque at the right vertebral artery origin with moderate to severe stenosis (series 11, image 157). The right vertebral artery is dominant. There is intermittent right V 2 segment calcified plaque with only mild associated stenosis. Similar mild right V3 segment calcified plaque without stenosis. The left vertebral artery is non dominant, diminutive, and arises directly from the arch. The left vertebral artery is only 1-2 mm diameter throughout its course but does remain patent into the posterior fossa. CTA HEAD Posterior circulation: Dominant distal right vertebral artery with mild V4 segment calcified plaque not resulting in stenosis. The right vertebral primarily supplies the basilar artery. Normal right PICA origin. Non dominant and diminutive left V4 segment is patent but thread-like. The left AICA appears dominant. No basilar artery stenosis. SCA origins are patent. There are fetal type bilateral PCA origins, the left P1 is larger. Bilateral PCA branches are within normal limits. There is an incidental prominent left basal vein of Rosenthal communicating with a large left tentorial vein which appears to be a normal variant (series 16, image 55). No AVM is identified. Anterior circulation: Both ICA siphons are patent. On the right there is mild to moderate siphon calcified plaque with mild stenosis. Right ophthalmic and posterior communicating artery origins are normal. On the left there is moderate to severe calcified plaque with moderate stenosis of both the left cavernous and supraclinoid ICA segments. The left ophthalmic and posterior communicating artery origins are normal. Patent carotid termini.  Normal MCA and right ACA origins. There is mild to moderate irregularity and stenosis at the left ACA origin. The A1 segments are fairly codominant. Anterior communicating artery and bilateral ACA branches are within normal limits. Left MCA M1 segment, bifurcation, and left MCA branches are within normal limits. Right MCA M1 segment, trifurcation, and right MCA branches are within normal limits. Venous sinuses: Patent. Prominent asymmetric left tentorial vein which appears to communicate with the left basal vein of Rosenthal stated above appears to be a normal variant. Dominant left transverse and sigmoid sinuses is a normal variant. Anatomic variants: Dominant right and diminutive left vertebral arteries. The left vertebral artery arises directly from the arch. Dominant left transverse and sigmoid sinuses as well as a prominent asymmetric left tentorial vein. Delayed phase: No abnormal enhancement identified. Review of the MIP images confirms the above findings IMPRESSION: 1. Radiographic-String-Sign Stenosis of the proximal Left ICA along a segment of about 14 mm due to bulky calcified plaque. Associated decreased caliber of the Left ICA. Superimposed downstream moderate to severe multifocal Left ICA siphon stenosis due to calcified plaque. 2. High-grade proximal Right ICA stenosis also due to bulky calcified plaque is numerically estimated at at least 75%, and appears to approach are Radiographic-String-Sign on some images. Mild to moderate Right ICA siphon stenosis due to calcified plaque. 3. Dominant Right Vertebral Artery with moderate to severe stenosis at its origin due to calcified plaque. Additional right vertebral calcified plaque without stenosis. 4. Non dominant and diminutive Left vertebral artery which arises directly from the arch. 5. Mild to moderate stenosis at the Left ACA origin appears to be atherosclerotic. No other intracranial arterial stenosis. 6. Asymmetric left tentorial vein associated  with the left basal vein  of Aubery Lapping appears to be a normal anatomic variant. 7. No acute intracranial abnormality. Mild for age cerebral white matter disease. 8. Aortic Atherosclerosis (ICD10-I70.0) and Emphysema (ICD10-J43.9). Electronically Signed   By: Genevie Ann M.D.   On: 03/20/2017 10:14   Ct Angio Neck W Or Wo Contrast  Result Date: 03/20/2017 CLINICAL DATA:  62 year old female with evidence of left carotid short segment occlusion or near occlusion, and high-grade right ICA stenosis on carotid Doppler ultrasound 03/08/2017. Subsequent encounter. EXAM: CT ANGIOGRAPHY HEAD AND NECK TECHNIQUE: Multidetector CT imaging of the head and neck was performed using the standard protocol during bolus administration of intravenous contrast. Multiplanar CT image reconstructions and MIPs were obtained to evaluate the vascular anatomy. Carotid stenosis measurements (when applicable) are obtained utilizing NASCET criteria, using the distal internal carotid diameter as the denominator. CONTRAST:  41mL ISOVUE-370 IOPAMIDOL (ISOVUE-370) INJECTION 76% COMPARISON:  Report of carotid Doppler ultrasound 03/08/2017. Head CT and cervical spine CT without contrast 06/05/2012. FINDINGS: CT HEAD Brain: Cerebral volume is stable since 2014 and normal for age. Mild for age scattered cerebral white matter hypodensity. No midline shift, ventriculomegaly, mass effect, evidence of mass lesion, intracranial hemorrhage or evidence of cortically based acute infarction. No cortical encephalomalacia identified. Calvarium and skull base: Negative; mild motion artifact at the skullbase. Paranasal sinuses: Clear. Orbits: Visualized orbits and scalp soft tissues are within normal limits. CTA NECK Skeleton: Absent dentition aside from impacted left maxillary wisdom tooth. No acute osseous abnormality identified. Upper chest: Centrilobular emphysema suspected. Otherwise negative visible lung parenchyma. No superior mediastinal lymphadenopathy. Other  neck: Subcentimeter hypodense right anterior thyroid lobe nodule on series 5, image 84 does not meet consensus criteria for ultrasound follow-up. The glottis is closed and there is intermittent motion artifact in the pharynx. No neck mass or lymphadenopathy identified. Aortic arch: Calcified aortic atherosclerosis. Four vessel arch configuration, the left vertebral artery appears to arise directly from the arch (series 5, image 108). Right carotid system: No brachiocephalic artery or right CCA origin stenosis despite calcified plaque. The right CCA is then negative until the right carotid bifurcation where bulky calcified plaque occurs affecting both the right ICA and ECA origins. High-grade right ICA origin stenosis results, numerically estimated at 75 % with respect to the distal vessel , and might be higher owing to the complex configuration of what appears to be both calcified and soft plaque on series 13, image 175. The cervical right ICA remains patent and has a normal caliber distally. Left carotid system: No left CCA origin stenosis despite calcified plaque. There is some circumferential soft plaque in the left CCA proximal to the bifurcation without stenosis. Bulky calcified plaque at the left carotid bifurcation continues into the left ICA origin and bulb with what appears essentially as a radiographic string sign stenosis over a 13-14 mm segment of the vessel (series 12, image 138. The downstream left ICA caliber is diminished to about 3 mm diameter, but no bona fide occlusion of the vessel is identified. Vertebral arteries: No proximal right subclavian artery stenosis despite calcified plaque. Calcified plaque at the right vertebral artery origin with moderate to severe stenosis (series 11, image 157). The right vertebral artery is dominant. There is intermittent right V 2 segment calcified plaque with only mild associated stenosis. Similar mild right V3 segment calcified plaque without stenosis. The  left vertebral artery is non dominant, diminutive, and arises directly from the arch. The left vertebral artery is only 1-2 mm diameter throughout its course but does remain  patent into the posterior fossa. CTA HEAD Posterior circulation: Dominant distal right vertebral artery with mild V4 segment calcified plaque not resulting in stenosis. The right vertebral primarily supplies the basilar artery. Normal right PICA origin. Non dominant and diminutive left V4 segment is patent but thread-like. The left AICA appears dominant. No basilar artery stenosis. SCA origins are patent. There are fetal type bilateral PCA origins, the left P1 is larger. Bilateral PCA branches are within normal limits. There is an incidental prominent left basal vein of Rosenthal communicating with a large left tentorial vein which appears to be a normal variant (series 16, image 55). No AVM is identified. Anterior circulation: Both ICA siphons are patent. On the right there is mild to moderate siphon calcified plaque with mild stenosis. Right ophthalmic and posterior communicating artery origins are normal. On the left there is moderate to severe calcified plaque with moderate stenosis of both the left cavernous and supraclinoid ICA segments. The left ophthalmic and posterior communicating artery origins are normal. Patent carotid termini. Normal MCA and right ACA origins. There is mild to moderate irregularity and stenosis at the left ACA origin. The A1 segments are fairly codominant. Anterior communicating artery and bilateral ACA branches are within normal limits. Left MCA M1 segment, bifurcation, and left MCA branches are within normal limits. Right MCA M1 segment, trifurcation, and right MCA branches are within normal limits. Venous sinuses: Patent. Prominent asymmetric left tentorial vein which appears to communicate with the left basal vein of Rosenthal stated above appears to be a normal variant. Dominant left transverse and sigmoid  sinuses is a normal variant. Anatomic variants: Dominant right and diminutive left vertebral arteries. The left vertebral artery arises directly from the arch. Dominant left transverse and sigmoid sinuses as well as a prominent asymmetric left tentorial vein. Delayed phase: No abnormal enhancement identified. Review of the MIP images confirms the above findings IMPRESSION: 1. Radiographic-String-Sign Stenosis of the proximal Left ICA along a segment of about 14 mm due to bulky calcified plaque. Associated decreased caliber of the Left ICA. Superimposed downstream moderate to severe multifocal Left ICA siphon stenosis due to calcified plaque. 2. High-grade proximal Right ICA stenosis also due to bulky calcified plaque is numerically estimated at at least 75%, and appears to approach are Radiographic-String-Sign on some images. Mild to moderate Right ICA siphon stenosis due to calcified plaque. 3. Dominant Right Vertebral Artery with moderate to severe stenosis at its origin due to calcified plaque. Additional right vertebral calcified plaque without stenosis. 4. Non dominant and diminutive Left vertebral artery which arises directly from the arch. 5. Mild to moderate stenosis at the Left ACA origin appears to be atherosclerotic. No other intracranial arterial stenosis. 6. Asymmetric left tentorial vein associated with the left basal vein of Rosenthal appears to be a normal anatomic variant. 7. No acute intracranial abnormality. Mild for age cerebral white matter disease. 8. Aortic Atherosclerosis (ICD10-I70.0) and Emphysema (ICD10-J43.9). Electronically Signed   By: Genevie Ann M.D.   On: 03/20/2017 10:14   Ct Foot Right Wo Contrast  Result Date: 04/06/2017 CLINICAL DATA:  Right foot pain at the first, second and third toes. Ulceration on the right foot. Diabetic patient. EXAM: CT OF THE RIGHT FOOT WITHOUT CONTRAST TECHNIQUE: Multidetector CT imaging of the right foot was performed according to the standard protocol.  Multiplanar CT image reconstructions were also generated. COMPARISON:  Plain films right foot 04/04/2017. FINDINGS: Bones/Joint/Cartilage No acute bony or joint abnormality is seen. No bony destructive change or periosteal  reaction. Ligaments Suboptimally assessed by CT. Muscles and Tendons Appear normal. Soft tissues Subcutaneous edema is present about foot. No fluid collection or soft tissue gas. IMPRESSION: Diffuse soft tissue swelling about the foot. The exam is otherwise negative. Electronically Signed   By: Inge Rise M.D.   On: 04/06/2017 10:44   Dg Foot Complete Right  Result Date: 04/04/2017 CLINICAL DATA:  Diabetic foot ulcer for several weeks. Right foot pain. EXAM: RIGHT FOOT COMPLETE - 3+ VIEW COMPARISON:  None. FINDINGS: Osseous alignment is normal. Bone mineralization is normal. No fracture line or displaced fracture fragment identified. No acute or suspicious osseous lesion. No destructive change to suggest osteomyelitis. Soft tissues about the right foot are unremarkable. IMPRESSION: Negative. Electronically Signed   By: Franki Cabot M.D.   On: 04/04/2017 20:48   Dg Toe 2nd Right  Result Date: 03/29/2017 CLINICAL DATA:  Swelling and infection of the right second toe. EXAM: RIGHT SECOND TOE COMPARISON:  None. FINDINGS: There is no evidence of fracture or dislocation. There is no evidence of osteolytic changes. Diffuse soft tissue swelling of the second digit. IMPRESSION: No radiographic evidence of osteomyelitis. Diffuse soft tissue swelling of the second digit without soft tissue emphysema. Electronically Signed   By: Fidela Salisbury M.D.   On: 03/29/2017 22:08    Lab Data:  CBC: Recent Labs  Lab 04/04/17 1347 04/05/17 0456 04/06/17 1019 04/07/17 0803 04/07/17 1130  WBC 8.8 7.8 6.4 6.2 5.8  NEUTROABS 6.2  --   --   --   --   HGB 14.5 14.2 14.7 15.4* 14.9  HCT 43.1 43.3 45.4 46.1* 45.2  MCV 83.0 83.8 82.8 82.5 83.1  PLT 147* 128* 130* 107* 935*   Basic Metabolic  Panel: Recent Labs  Lab 04/04/17 1347 04/05/17 0456 04/06/17 1019 04/07/17 0803 04/07/17 1130  NA 130* 134* 132* 134* 134*  K 4.1 3.9 4.0 4.3 4.1  CL 92* 96* 95* 94* 95*  CO2 25 26 24 25 26   GLUCOSE 284* 106* 106* 104* 126*  BUN 7 5* 6 8 6   CREATININE 0.54 0.46 0.53 0.48 0.51  CALCIUM 9.5 9.5 9.6 9.4 9.5   GFR: Estimated Creatinine Clearance: 94.9 mL/min (by C-G formula based on SCr of 0.51 mg/dL). Liver Function Tests: Recent Labs  Lab 04/04/17 1347 04/07/17 1130  AST 23 33  ALT 20 27  ALKPHOS 105 69  BILITOT 0.6 0.6  PROT 6.3* 6.9  ALBUMIN 3.6 3.8   No results for input(s): LIPASE, AMYLASE in the last 168 hours. No results for input(s): AMMONIA in the last 168 hours. Coagulation Profile: Recent Labs  Lab 04/07/17 1130  INR 1.02   Cardiac Enzymes: No results for input(s): CKTOTAL, CKMB, CKMBINDEX, TROPONINI in the last 168 hours. BNP (last 3 results) No results for input(s): PROBNP in the last 8760 hours. HbA1C: No results for input(s): HGBA1C in the last 72 hours. CBG: Recent Labs  Lab 04/06/17 1122 04/06/17 1658 04/06/17 2120 04/07/17 0828 04/07/17 1040  GLUCAP 70 134* 178* 107* 127*   Lipid Profile: No results for input(s): CHOL, HDL, LDLCALC, TRIG, CHOLHDL, LDLDIRECT in the last 72 hours. Thyroid Function Tests: No results for input(s): TSH, T4TOTAL, FREET4, T3FREE, THYROIDAB in the last 72 hours. Anemia Panel: No results for input(s): VITAMINB12, FOLATE, FERRITIN, TIBC, IRON, RETICCTPCT in the last 72 hours. Urine analysis:    Component Value Date/Time   COLORURINE YELLOW 04/05/2017 [place] 04/05/2017 0928   LABSPEC 1.008 04/05/2017 7017  PHURINE 7.0 04/05/2017 0928   GLUCOSEU NEGATIVE 04/05/2017 0928   HGBUR NEGATIVE 04/05/2017 0928   BILIRUBINUR NEGATIVE 04/05/2017 0928   BILIRUBINUR neg 01/28/2013 1327   [place] 04/05/2017 0928   PROTEINUR NEGATIVE 04/05/2017 0928   UROBILINOGEN negative 01/28/2013 1327     UROBILINOGEN 1.0 08/09/2007 0200   NITRITE NEGATIVE 04/05/2017 0928   LEUKOCYTESUR NEGATIVE 04/05/2017 0928     Faizaan Falls M.D. [facility] 04/07/2017, 2:21 PM  Pager: [phone] Between 7am to 7pm - call Pager - [phone]  After 7pm go to [email or web address] - password [email or web address]  Call night coverage person covering after 7pm

## 2017-04-07 NOTE — Progress Notes (Signed)
Transferred to 4 E 24. Respirations stable on 2l [place]. Pulses doppled on both feet on adm to room. Cap refill within limits. Patient has extreme ( Painful ) sensitivity to touch on right foot.

## 2017-04-07 NOTE — Op Note (Signed)
Patient name: [name] [name] MRN: [id number] DOB: 09-13-1955 Sex: female  04/07/2017 Pre-operative Diagnosis: Ischemic changes to right leg Post-operative diagnosis:  Same Surgeon:  [name] Assistants:  Arlee Muslim Procedure:   #1: Right common femoral to below-knee popliteal artery bypass graft with ipsilateral translocated non-reversed saphenous vein   #2: Right external iliac, common femoral, profundofemoral endarterectomy with vein patch angioplasty   #3: Endarterectomy of right popliteal artery   #4: Provena  Wound VAC to the right groin   Anesthesia: General Blood Loss: 300 cc Specimens: None  Findings: The vein was very scarred and and took a long time to harvest.  There are multiple venous branches that required ligation.  When the vein was distended it actually appeared to be an adequate vein.  The patient had near occlusive plaque in the distal external iliac, common femoral, and profundofemoral artery.  I performed an extensive endarterectomy.  I harvested the anterior lateral saphenous vein to use as a vein patch angioplasty on the common femoral artery extending down onto the profundofemoral artery.  A separate venotomy within the patch was made for the proximal anastomosis.  The below-knee popliteal anastomosis was down to the takeoff of the anterior tibial artery.  I ligated the anterior tibial vein.  Indications: I have been following the patient for nonhealing wounds on the right foot.  We were planning for a right femoral-popliteal bypass graft as she was not a candidate for percutaneous intervention because of her common femoral disease.  During her preoperative workup she was found to have a high-grade carotid stenosis.  She was originally scheduled for carotid endarterectomy today however she was admitted to the hospital several days ago with bacteremia.  She appears to have cleared her bacteremia, however her foot has gotten worse and therefore we discussed  proceeding with right leg bypass graft instead of her carotid endarterectomy.  The risks and benefits of the operation were discussed with the patient and she wished to proceed.  She is having rest pain in addition to her ulcers.  Procedure:  The patient was identified in the holding area and taken to [place] 11  The patient was then placed supine on the table. general anesthesia was administered.  The patient was prepped and draped in the usual sterile fashion.  A time out was called and antibiotics were administered.  Ultrasound was used to identify the saphenous vein in the leg.  It appeared to be of adequate caliber.  I began by making a longitudinal incision in the right groin.  Cautery was used about subtenons tissue down the femoral sheath which was opened sharply.  I dissected out the common femoral artery profundofemoral and superficial femoral artery.  These were individually isolated with Vesseloops.  I then isolated multiple large branches and encircled them with 2-0 silk ties.  Attention was then turned towards the vein.  I initially dissected out the anterior accessory.  Once I realize this was anterior accessory, I went back to the saphenofemoral junction and identified the main saphenous vein.  Saphenous vein was then harvested throughout the right leg through skip incisions.  There were multiple branches throughout the saphenous vein, more so than normal.  In addition in the lower half  of the thigh and into the below-knee incision, the vein was significantly scarred in.  It was very tedious getting the vein fully mobilized.  Once I had the vein isolated, I turned my attention towards the below-knee popliteal artery.  This  was exposed through the previous incision used to harvest the vein.  Identified the gastrocnemius muscle and reflected it posteriorly.  I did take down part of the soleus muscle.  I was ultimately able to isolate the popliteal artery which was heavily calcified.  I did  ligated the anterior tibial vein to get good exposure of the tibioperoneal trunk.  At this level there was significant circumferential calcification.  The vein was then ligated distally with a 2-0 silk tie.  It was ligated at the saphenofemoral junction which was oversewn with 5-0 Prolene in 2 layers.  The vein was then prepared on the back table.  It actually distended nicely despite how scarred and it was.  It measured about 3 mm distally and was of excellent caliber about 6 mm up near the saphenofemoral junction.  It was marked for orientation.  I then used a long Gore tunneler to create a subsartorial tunnel.  The patient was fully heparinized.  After the heparin circulated the common femoral and profunda femoral artery as well as a side branches were occluded with vascular clamps.  I made a arteriotomy in the common femoral artery with a #11 blade and extended this longitudinally with Potts scissors up into the proximal common femoral artery and then about a centimeter and a half onto the profundofemoral artery.  An extensive endarterectomy of the common femoral and profunda femoral artery was performed.  I inserted a 4 Fogarty catheter into the external iliac artery so I could perform endarterectomy up into the external iliac artery.  Once this was done there was excellent inflow from the iliac artery.  I was worried about the length of the vein and therefore I used the anterior branch of the saphenous vein to perform vein patch angioplasty of the common femoral and profunda femoral artery.  The vein patch extended onto the profundofemoral artery for approximately 1.5 cm.  Once this was done the clamps were released and there was excellent pulsatile inflow.  I then reoccluded the common femoral and profunda femoral artery and using #11 blade to make a venotomy in the vein patch.  I then brought the saphenous vein on the table and spatulated this to fit the size of the venotomy and performed a running  anastomosis in a end-to-side fashion with 5-0 Prolene.  Once this was done the clamps were released.  I used a valvulotome to lyse the valves within the vein.  There was excellent flow through the vein.  The vein was then brought through the previously created tunnel making sure to maintain proper orientation.  A tourniquet was placed on the upper thigh.  An Esmarch was used to exsanguinate the leg and the tourniquet was taken to 250 mm of pressure.  I then used a #11 blade to make an arteriotomy in the popliteal artery which was extended longitudinally with Potts scissors.  I had to perform endarterectomy in the popliteal artery in order to remove some of the heavily calcified plaque.  After this was done I was satisfied with the appearance of the artery.  The leg was straightened and the vein was cut to the appropriate length and spatulated to fit the size of the arteriotomy.  A running anastomosis was then created with 6-0 Prolene.  Prior to completion, the tourniquet was let down.  The appropriate flushing maneuvers were performed and the anastomosis was completed.  At this point, the patient had brisk graft dependent Doppler signals in the posterior tibial and anterior tibial artery.  I elected not to see an arteriogram.  50 mg of protamine was then given.  Once hemostasis was satisfactory, I closed the vein harvest incisions with 2-0 Vicryl in the subcutaneous tissue and 4-0 Vicryl on the skin.  The below-knee incision was closed by reapproximating the fascia with 2-0 Vicryl the subcutaneous tissue with 2-0 Vicryl, and skin with 4-0 Vicryl.  The groin incision was closed by reapproximating the femoral sheath with 2-0 Vicryl followed by multiple layers of 3-0 Vicryl in the subcutaneous tissue and a 4-0 Vicryl on the skin.  A Provena wound VAC was placed in the right groin.  The patient was then successfully extubated and taken to recovery in stable condition.  There were no immediate  complications.   Disposition: To PACU stable   V. [name], M.D. [facility] Office: [phone] Pager:  [phone]

## 2017-04-08 ENCOUNTER — Encounter (HOSPITAL_COMMUNITY): Payer: Self-pay | Admitting: Surgery

## 2017-04-08 DIAGNOSIS — I739 Peripheral vascular disease, unspecified: Secondary | ICD-10-CM

## 2017-04-08 LAB — CBC
HEMATOCRIT: 35.9 % — AB (ref 36.0–46.0)
Hemoglobin: 11.6 g/dL — ABNORMAL LOW (ref 12.0–15.0)
MCH: 27.1 pg (ref 26.0–34.0)
MCHC: 32.3 g/dL (ref 30.0–36.0)
MCV: 83.9 fL (ref 78.0–100.0)
PLATELETS: 121 10*3/uL — AB (ref 150–400)
RBC: 4.28 MIL/uL (ref 3.87–5.11)
RDW: 15.1 % (ref 11.5–15.5)
WBC: 11.1 10*3/uL — AB (ref 4.0–10.5)

## 2017-04-08 LAB — BASIC METABOLIC PANEL
Anion gap: 10 (ref 5–15)
BUN: 10 mg/dL (ref 6–20)
CHLORIDE: 97 mmol/L — AB (ref 101–111)
CO2: 26 mmol/L (ref 22–32)
CREATININE: 0.54 mg/dL (ref 0.44–1.00)
Calcium: 8.6 mg/dL — ABNORMAL LOW (ref 8.9–10.3)
GFR calc Af Amer: >60 mL/min (ref >60)
GFR calc non Af Amer: >60 mL/min (ref >60)
GLUCOSE: 169 mg/dL — AB (ref 65–99)
POTASSIUM: 4.1 mmol/L (ref 3.5–5.1)
Sodium: 133 mmol/L — ABNORMAL LOW (ref 135–145)

## 2017-04-08 LAB — GLUCOSE, CAPILLARY
GLUCOSE-CAPILLARY: 176 mg/dL — AB (ref 65–99)
GLUCOSE-CAPILLARY: 149 mg/dL — AB (ref 65–99)
Glucose-Capillary: 152 mg/dL — ABNORMAL HIGH (ref 65–99)
Glucose-Capillary: 190 mg/dL — ABNORMAL HIGH (ref 65–99)

## 2017-04-08 MED ORDER — IPRATROPIUM-ALBUTEROL 0.5-2.5 (3) MG/3ML IN SOLN
3.0000 mL | Freq: Two times a day (BID) | RESPIRATORY_TRACT | Status: DC
  Administered 2017-04-08 (×2): 3 mL via RESPIRATORY_TRACT
  Filled 2017-04-08 (×4): qty 3

## 2017-04-08 MED ORDER — POLYETHYLENE GLYCOL 3350 17 G PO PACK
17.0000 g | PACK | Freq: Every day | ORAL | Status: DC | PRN
  Administered 2017-04-08: 17 g via ORAL
  Filled 2017-04-08: qty 1

## 2017-04-08 MED ORDER — ENOXAPARIN SODIUM 60 MG/0.6ML ~~LOC~~ SOLN
60.0000 mg | SUBCUTANEOUS | Status: DC
  Administered 2017-04-08 – 2017-04-09 (×2): 60 mg via SUBCUTANEOUS
  Filled 2017-04-08 (×3): qty 0.6

## 2017-04-08 NOTE — Progress Notes (Signed)
[facility]                                                                              Patient Demographics  [name] [name], is a 62 y.o. female, DOB - 12/14/1955, VOJ:[id number]  Admit date - 04/04/2017   Admitting Physician Rise Patience, MD  Outpatient Primary MD for the patient is Jacelyn Pi, MD  Outpatient specialists:   LOS - 4  days   Medical records reviewed and are as summarized below:    Chief Complaint  Patient presents with  . Abnormal Lab       Brief summary    [name] [name] is a 62 y.o. female with history of peripheral vascular disease, diabetes mellitus type 2, hyperlipidemia, tobacco abuse, thrombocytopenia, hypertension who was recently admitted and discharged on January 10 for right leg cellulitis and second toe gangrene was instructed to come to the ER after patient's blood cultures drawn during that stay grew Streptococcus variance in both bottles.  Patient was discharged home on Keflex.  Patient denied any fever chills after discharge.  Vascular surgeon Dr. Trula Slade was planning to have patient's carotid surgery this week.  Assessment & Plan    Principal Problem:   Bacteremia in the setting of gangrenous second right toe and right foot cellulitis - X-ray negative for osteomyelitis - Blood cultures positive for Streptococcus meridians 2/2, 03/30/17, repeat blood cultures 1/15 negative so far - Continue IV Rocephin for now, appreciate ID recommendations - Patient had abdominal aortogram with lower extremity on 02/28/17 due to bilateral leg pain with ulcers, which had shown no significant aortoiliac occlusive disease, high-grade calcific stenotic lesions in the right common femoral artery with superficial femoral artery occlusion, occluded left superficial femoral artery with reconstitution of above-knee popliteal artery.  Was recommended bypass by vascular surgery. -CT right foot showed osteomyelitis, vascular surgery was  consulted due to severe PVD  -Patient underwent right fem-k Pop bypass, postop day #1  -Doing well, pain on movement, hemoglobin 11.6, monitor closely  Active Problems:   DM (diabetes mellitus), type 2 with peripheral vascular complications (HCC) -CBGs stable, continue novoLog 70/30 50units BID    Hypertension -BP soft, will hold HCTZ.  Continue lisinopril 5 mg daily  Chronic thrombocytopenia (HCC) -PLT count stable, monitor closely    PVD (peripheral vascular disease) ([name]), critical carotid stenosis -Vascular surgery following, postponed carotid surgery due to #1.   Code Status: Full CODE STATUS DVT Prophylaxis: Heparin subcu Family Communication: Discussed in detail with the patient, all imaging results, lab results explained to the patient   Disposition Plan:   Time Spent in minutes  25 minutes  Procedures:   04/07/17 : right fem-K pop BPG with vein and right femoral endarterectomy and VPA   Consultants:   ID Vascular surgery  Antimicrobials:   IV Rocephin 1/15   Medications  Scheduled Meds: . aspirin EC  81 mg Oral Daily  . atorvastatin  20 mg Oral Daily  . enoxaparin (LOVENOX) injection  60 mg Subcutaneous Q24H  . folic acid  1 mg Oral Daily  . gabapentin  300 mg Oral BID  . hydrochlorothiazide  25  mg Oral Daily  . insulin aspart  0-9 Units Subcutaneous TID WC  . insulin aspart protamine- aspart  50 Units Subcutaneous BID  . ipratropium-albuterol  3 mL Nebulization BID  . lisinopril  5 mg Oral Daily  . pantoprazole  40 mg Oral Daily  . sertraline  100 mg Oral Daily   Continuous Infusions: . sodium chloride 75 mL/hr at 04/07/17 2336  . cefTRIAXone (ROCEPHIN)  IV Stopped (04/08/17 0420)   PRN Meds:.acetaminophen **OR** acetaminophen, guaiFENesin-dextromethorphan, hydrALAZINE, HYDROmorphone (DILAUDID) injection, labetalol, metoprolol tartrate, ondansetron **OR** ondansetron (ZOFRAN) IV, oxyCODONE-acetaminophen, phenol, [START ON 04/09/2017] pneumococcal 23  valent vaccine   Antibiotics   Anti-infectives (From admission, onward)   Start     Dose/Rate Route Frequency Ordered Stop   04/07/17 1110  vancomycin (VANCOCIN) 1-5 GM/200ML-% IVPB    Comments:  [name], [name]   : cabinet override      04/07/17 1110 04/07/17 1315   04/07/17 1103  vancomycin (VANCOCIN) IVPB 1000 mg/200 mL premix     1,000 mg 200 mL/hr over 60 Minutes Intravenous 60 min pre-op 04/07/17 1103 04/07/17 1415   04/05/17 0400  cefTRIAXone (ROCEPHIN) 2 g in dextrose 5 % 50 mL IVPB     2 g 100 mL/hr over 30 Minutes Intravenous Every 24 hours 04/04/17 2111     04/04/17 2000  ceFEPIme (MAXIPIME) 2 g in dextrose 5 % 50 mL IVPB  Status:  Discontinued     2 g 100 mL/hr over 30 Minutes Intravenous Every 8 hours 04/04/17 1959 04/04/17 2111        Subjective:   [name] [name] was seen and examined today.  Feels better, pain in the leg with movement otherwise no acute issues.  No fevers or chills.  Patient denies dizziness, chest pain, shortness of breath, abdominal pain, N/V/D/C, new weakness, numbess, tingling.    Objective:   Vitals:   04/08/17 1000 04/08/17 1057 04/08/17 1100 04/08/17 1200  BP: 114/66   105/80  Pulse:   (!) 106 (!) 106  Resp:   18 20  Temp:      TempSrc:    Oral  SpO2:  94%  93%  Weight:      Height:        Intake/Output Summary (Last 24 hours) at 04/08/2017 1357 Last data filed at 04/08/2017 1041 Gross per 24 hour  Intake 4320 ml  Output 1200 ml  Net 3120 ml     Wt Readings from Last 3 Encounters:  04/07/17 116.8 kg (257 lb 8 oz)  03/29/17 119.3 kg (263 lb)  03/13/17 119.3 kg (263 lb)     Exam    General: Alert and oriented x 3, NAD  Eyes:   HEENT:   Cardiovascular: S1 S2 clear, RRR, No pedal edema b/l  Respiratory: CTAb  Gastrointestinal: Soft, nontender, nondistended, + bowel sounds  Ext: no pedal edema bilaterally  Neuro: no new deficits  Musculoskeletal: No digital cyanosis, clubbing  Skin: Right foot  cellulitis, dry gangrene of second toe  Psych: Normal affect and demeanor, alert and oriented x3   Data Reviewed:  I have personally reviewed following labs and imaging studies  Micro Results Recent Results (from the past 240 hour(s))  Culture, blood (x 2)     Status: Abnormal   Collection Time: 03/30/17  5:25 AM  Result Value Ref Range Status   Specimen Description BLOOD WRIST RIGHT  Final   Special Requests   Final    BOTTLES DRAWN AEROBIC AND ANAEROBIC Blood Culture adequate  volume   Culture  Setup Time   Final    GRAM POSITIVE COCCI AEROBIC BOTTLE ONLY CRITICAL VALUE NOTED.  VALUE IS CONSISTENT WITH PREVIOUSLY REPORTED AND CALLED VALUE.    Culture (A)  Final    VIRIDANS STREPTOCOCCUS SUSCEPTIBILITIES PERFORMED ON PREVIOUS CULTURE WITHIN THE LAST 5 DAYS.    Report Status 04/03/2017 FINAL  Final  Culture, blood (x 2)     Status: Abnormal   Collection Time: 03/30/17  5:32 AM  Result Value Ref Range Status   Specimen Description BLOOD LEFT ANTECUBITAL  Final   Special Requests IN PEDIATRIC BOTTLE Blood Culture adequate volume  Final   Culture  Setup Time   Final    GRAM POSITIVE COCCI IN CHAINS IN PEDIATRIC BOTTLE CRITICAL RESULT CALLED TO, READ BACK BY AND VERIFIED WITH: S. COBLE RN 03/31/17 0035 BEAMJ    Culture VIRIDANS STREPTOCOCCUS (A)  Final   Report Status 04/03/2017 FINAL  Final   Organism ID, Bacteria VIRIDANS STREPTOCOCCUS  Final      Susceptibility   Viridans streptococcus - MIC*    PENICILLIN <=0.06 SENSITIVE Sensitive     CEFTRIAXONE <=0.12 SENSITIVE Sensitive     ERYTHROMYCIN 2 RESISTANT Resistant     LEVOFLOXACIN 1 SENSITIVE Sensitive     VANCOMYCIN 1 SENSITIVE Sensitive     * VIRIDANS STREPTOCOCCUS  Blood Culture ID Panel (Reflexed)     Status: Abnormal   Collection Time: 03/30/17  5:32 AM  Result Value Ref Range Status   Enterococcus species NOT DETECTED NOT DETECTED Final   Listeria monocytogenes NOT DETECTED NOT DETECTED Final   Staphylococcus  species NOT DETECTED NOT DETECTED Final   Staphylococcus aureus NOT DETECTED NOT DETECTED Final   Streptococcus species DETECTED (A) NOT DETECTED Final    Comment: Not Enterococcus species, Streptococcus agalactiae, Streptococcus pyogenes, or Streptococcus pneumoniae. CRITICAL RESULT CALLED TO, READ BACK BY AND VERIFIED WITH: S. COBLE RN 03/31/17 0035 BEAMJ    Streptococcus agalactiae NOT DETECTED NOT DETECTED Final   Streptococcus pneumoniae NOT DETECTED NOT DETECTED Final   Streptococcus pyogenes NOT DETECTED NOT DETECTED Final   Acinetobacter baumannii NOT DETECTED NOT DETECTED Final   Enterobacteriaceae species NOT DETECTED NOT DETECTED Final   Enterobacter cloacae complex NOT DETECTED NOT DETECTED Final   Escherichia coli NOT DETECTED NOT DETECTED Final   Klebsiella oxytoca NOT DETECTED NOT DETECTED Final   Klebsiella pneumoniae NOT DETECTED NOT DETECTED Final   Proteus species NOT DETECTED NOT DETECTED Final   Serratia marcescens NOT DETECTED NOT DETECTED Final   Haemophilus influenzae NOT DETECTED NOT DETECTED Final   Neisseria meningitidis NOT DETECTED NOT DETECTED Final   Pseudomonas aeruginosa NOT DETECTED NOT DETECTED Final   Candida albicans NOT DETECTED NOT DETECTED Final   Candida glabrata NOT DETECTED NOT DETECTED Final   Candida krusei NOT DETECTED NOT DETECTED Final   Candida parapsilosis NOT DETECTED NOT DETECTED Final   Candida tropicalis NOT DETECTED NOT DETECTED Final  Culture, blood (routine x 2)     Status: None (Preliminary result)   Collection Time: 04/04/17  8:06 PM  Result Value Ref Range Status   Specimen Description BLOOD RIGHT ANTECUBITAL  Final   Special Requests   Final    BOTTLES DRAWN AEROBIC AND ANAEROBIC Blood Culture adequate volume   Culture NO GROWTH 3 DAYS  Final   Report Status PENDING  Incomplete  Culture, blood (routine x 2)     Status: None (Preliminary result)   Collection Time: 04/04/17  9:52 PM  Result Value Ref Range Status    Specimen Description BLOOD LEFT ANTECUBITAL  Final   Special Requests   Final    BOTTLES DRAWN AEROBIC AND ANAEROBIC Blood Culture adequate volume   Culture NO GROWTH 3 DAYS  Final   Report Status PENDING  Incomplete  Surgical pcr screen     Status: None   Collection Time: 04/07/17 10:42 AM  Result Value Ref Range Status   MRSA, PCR NEGATIVE NEGATIVE Final   Staphylococcus aureus NEGATIVE NEGATIVE Final    Comment: (NOTE) The Xpert SA Assay (FDA approved for NASAL specimens in patients 38 years of age and older), is one component of a comprehensive surveillance program. It is not intended to diagnose infection nor to guide or monitor treatment.     Radiology Reports Ct Angio Head W Or Wo Contrast  Result Date: 03/20/2017 CLINICAL DATA:  62 year old female with evidence of left carotid short segment occlusion or near occlusion, and high-grade right ICA stenosis on carotid Doppler ultrasound 03/08/2017. Subsequent encounter. EXAM: CT ANGIOGRAPHY HEAD AND NECK TECHNIQUE: Multidetector CT imaging of the head and neck was performed using the standard protocol during bolus administration of intravenous contrast. Multiplanar CT image reconstructions and MIPs were obtained to evaluate the vascular anatomy. Carotid stenosis measurements (when applicable) are obtained utilizing NASCET criteria, using the distal internal carotid diameter as the denominator. CONTRAST:  16mL ISOVUE-370 IOPAMIDOL (ISOVUE-370) INJECTION 76% COMPARISON:  Report of carotid Doppler ultrasound 03/08/2017. Head CT and cervical spine CT without contrast 06/05/2012. FINDINGS: CT HEAD Brain: Cerebral volume is stable since 2014 and normal for age. Mild for age scattered cerebral white matter hypodensity. No midline shift, ventriculomegaly, mass effect, evidence of mass lesion, intracranial hemorrhage or evidence of cortically based acute infarction. No cortical encephalomalacia identified. Calvarium and skull base: Negative; mild  motion artifact at the skullbase. Paranasal sinuses: Clear. Orbits: Visualized orbits and scalp soft tissues are within normal limits. CTA NECK Skeleton: Absent dentition aside from impacted left maxillary wisdom tooth. No acute osseous abnormality identified. Upper chest: Centrilobular emphysema suspected. Otherwise negative visible lung parenchyma. No superior mediastinal lymphadenopathy. Other neck: Subcentimeter hypodense right anterior thyroid lobe nodule on series 5, image 84 does not meet consensus criteria for ultrasound follow-up. The glottis is closed and there is intermittent motion artifact in the pharynx. No neck mass or lymphadenopathy identified. Aortic arch: Calcified aortic atherosclerosis. Four vessel arch configuration, the left vertebral artery appears to arise directly from the arch (series 5, image 108). Right carotid system: No brachiocephalic artery or right CCA origin stenosis despite calcified plaque. The right CCA is then negative until the right carotid bifurcation where bulky calcified plaque occurs affecting both the right ICA and ECA origins. High-grade right ICA origin stenosis results, numerically estimated at 75 % with respect to the distal vessel , and might be higher owing to the complex configuration of what appears to be both calcified and soft plaque on series 13, image 175. The cervical right ICA remains patent and has a normal caliber distally. Left carotid system: No left CCA origin stenosis despite calcified plaque. There is some circumferential soft plaque in the left CCA proximal to the bifurcation without stenosis. Bulky calcified plaque at the left carotid bifurcation continues into the left ICA origin and bulb with what appears essentially as a radiographic string sign stenosis over a 13-14 mm segment of the vessel (series 12, image 138. The downstream left ICA caliber is diminished to about 3 mm diameter, but no bona fide occlusion  of the vessel is identified.  Vertebral arteries: No proximal right subclavian artery stenosis despite calcified plaque. Calcified plaque at the right vertebral artery origin with moderate to severe stenosis (series 11, image 157). The right vertebral artery is dominant. There is intermittent right V 2 segment calcified plaque with only mild associated stenosis. Similar mild right V3 segment calcified plaque without stenosis. The left vertebral artery is non dominant, diminutive, and arises directly from the arch. The left vertebral artery is only 1-2 mm diameter throughout its course but does remain patent into the posterior fossa. CTA HEAD Posterior circulation: Dominant distal right vertebral artery with mild V4 segment calcified plaque not resulting in stenosis. The right vertebral primarily supplies the basilar artery. Normal right PICA origin. Non dominant and diminutive left V4 segment is patent but thread-like. The left AICA appears dominant. No basilar artery stenosis. SCA origins are patent. There are fetal type bilateral PCA origins, the left P1 is larger. Bilateral PCA branches are within normal limits. There is an incidental prominent left basal vein of Rosenthal communicating with a large left tentorial vein which appears to be a normal variant (series 16, image 55). No AVM is identified. Anterior circulation: Both ICA siphons are patent. On the right there is mild to moderate siphon calcified plaque with mild stenosis. Right ophthalmic and posterior communicating artery origins are normal. On the left there is moderate to severe calcified plaque with moderate stenosis of both the left cavernous and supraclinoid ICA segments. The left ophthalmic and posterior communicating artery origins are normal. Patent carotid termini. Normal MCA and right ACA origins. There is mild to moderate irregularity and stenosis at the left ACA origin. The A1 segments are fairly codominant. Anterior communicating artery and bilateral ACA branches are  within normal limits. Left MCA M1 segment, bifurcation, and left MCA branches are within normal limits. Right MCA M1 segment, trifurcation, and right MCA branches are within normal limits. Venous sinuses: Patent. Prominent asymmetric left tentorial vein which appears to communicate with the left basal vein of Rosenthal stated above appears to be a normal variant. Dominant left transverse and sigmoid sinuses is a normal variant. Anatomic variants: Dominant right and diminutive left vertebral arteries. The left vertebral artery arises directly from the arch. Dominant left transverse and sigmoid sinuses as well as a prominent asymmetric left tentorial vein. Delayed phase: No abnormal enhancement identified. Review of the MIP images confirms the above findings IMPRESSION: 1. Radiographic-String-Sign Stenosis of the proximal Left ICA along a segment of about 14 mm due to bulky calcified plaque. Associated decreased caliber of the Left ICA. Superimposed downstream moderate to severe multifocal Left ICA siphon stenosis due to calcified plaque. 2. High-grade proximal Right ICA stenosis also due to bulky calcified plaque is numerically estimated at at least 75%, and appears to approach are Radiographic-String-Sign on some images. Mild to moderate Right ICA siphon stenosis due to calcified plaque. 3. Dominant Right Vertebral Artery with moderate to severe stenosis at its origin due to calcified plaque. Additional right vertebral calcified plaque without stenosis. 4. Non dominant and diminutive Left vertebral artery which arises directly from the arch. 5. Mild to moderate stenosis at the Left ACA origin appears to be atherosclerotic. No other intracranial arterial stenosis. 6. Asymmetric left tentorial vein associated with the left basal vein of Rosenthal appears to be a normal anatomic variant. 7. No acute intracranial abnormality. Mild for age cerebral white matter disease. 8. Aortic Atherosclerosis (ICD10-I70.0) and  Emphysema (ICD10-J43.9). Electronically Signed   By: Lemmie Evens  [place] Crane M.D.   On: 03/20/2017 10:14   Ct Angio Neck W Or Wo Contrast  Result Date: 03/20/2017 CLINICAL DATA:  62 year old female with evidence of left carotid short segment occlusion or near occlusion, and high-grade right ICA stenosis on carotid Doppler ultrasound 03/08/2017. Subsequent encounter. EXAM: CT ANGIOGRAPHY HEAD AND NECK TECHNIQUE: Multidetector CT imaging of the head and neck was performed using the standard protocol during bolus administration of intravenous contrast. Multiplanar CT image reconstructions and MIPs were obtained to evaluate the vascular anatomy. Carotid stenosis measurements (when applicable) are obtained utilizing NASCET criteria, using the distal internal carotid diameter as the denominator. CONTRAST:  67mL ISOVUE-370 IOPAMIDOL (ISOVUE-370) INJECTION 76% COMPARISON:  Report of carotid Doppler ultrasound 03/08/2017. Head CT and cervical spine CT without contrast 06/05/2012. FINDINGS: CT HEAD Brain: Cerebral volume is stable since 2014 and normal for age. Mild for age scattered cerebral white matter hypodensity. No midline shift, ventriculomegaly, mass effect, evidence of mass lesion, intracranial hemorrhage or evidence of cortically based acute infarction. No cortical encephalomalacia identified. Calvarium and skull base: Negative; mild motion artifact at the skullbase. Paranasal sinuses: Clear. Orbits: Visualized orbits and scalp soft tissues are within normal limits. CTA NECK Skeleton: Absent dentition aside from impacted left maxillary wisdom tooth. No acute osseous abnormality identified. Upper chest: Centrilobular emphysema suspected. Otherwise negative visible lung parenchyma. No superior mediastinal lymphadenopathy. Other neck: Subcentimeter hypodense right anterior thyroid lobe nodule on series 5, image 84 does not meet consensus criteria for ultrasound follow-up. The glottis is closed and there is intermittent motion  artifact in the pharynx. No neck mass or lymphadenopathy identified. Aortic arch: Calcified aortic atherosclerosis. Four vessel arch configuration, the left vertebral artery appears to arise directly from the arch (series 5, image 108). Right carotid system: No brachiocephalic artery or right CCA origin stenosis despite calcified plaque. The right CCA is then negative until the right carotid bifurcation where bulky calcified plaque occurs affecting both the right ICA and ECA origins. High-grade right ICA origin stenosis results, numerically estimated at 75 % with respect to the distal vessel , and might be higher owing to the complex configuration of what appears to be both calcified and soft plaque on series 13, image 175. The cervical right ICA remains patent and has a normal caliber distally. Left carotid system: No left CCA origin stenosis despite calcified plaque. There is some circumferential soft plaque in the left CCA proximal to the bifurcation without stenosis. Bulky calcified plaque at the left carotid bifurcation continues into the left ICA origin and bulb with what appears essentially as a radiographic string sign stenosis over a 13-14 mm segment of the vessel (series 12, image 138. The downstream left ICA caliber is diminished to about 3 mm diameter, but no bona fide occlusion of the vessel is identified. Vertebral arteries: No proximal right subclavian artery stenosis despite calcified plaque. Calcified plaque at the right vertebral artery origin with moderate to severe stenosis (series 11, image 157). The right vertebral artery is dominant. There is intermittent right V 2 segment calcified plaque with only mild associated stenosis. Similar mild right V3 segment calcified plaque without stenosis. The left vertebral artery is non dominant, diminutive, and arises directly from the arch. The left vertebral artery is only 1-2 mm diameter throughout its course but does remain patent into the posterior  fossa. CTA HEAD Posterior circulation: Dominant distal right vertebral artery with mild V4 segment calcified plaque not resulting in stenosis. The right vertebral primarily supplies the basilar artery. Normal right PICA  origin. Non dominant and diminutive left V4 segment is patent but thread-like. The left AICA appears dominant. No basilar artery stenosis. SCA origins are patent. There are fetal type bilateral PCA origins, the left P1 is larger. Bilateral PCA branches are within normal limits. There is an incidental prominent left basal vein of Rosenthal communicating with a large left tentorial vein which appears to be a normal variant (series 16, image 55). No AVM is identified. Anterior circulation: Both ICA siphons are patent. On the right there is mild to moderate siphon calcified plaque with mild stenosis. Right ophthalmic and posterior communicating artery origins are normal. On the left there is moderate to severe calcified plaque with moderate stenosis of both the left cavernous and supraclinoid ICA segments. The left ophthalmic and posterior communicating artery origins are normal. Patent carotid termini. Normal MCA and right ACA origins. There is mild to moderate irregularity and stenosis at the left ACA origin. The A1 segments are fairly codominant. Anterior communicating artery and bilateral ACA branches are within normal limits. Left MCA M1 segment, bifurcation, and left MCA branches are within normal limits. Right MCA M1 segment, trifurcation, and right MCA branches are within normal limits. Venous sinuses: Patent. Prominent asymmetric left tentorial vein which appears to communicate with the left basal vein of Rosenthal stated above appears to be a normal variant. Dominant left transverse and sigmoid sinuses is a normal variant. Anatomic variants: Dominant right and diminutive left vertebral arteries. The left vertebral artery arises directly from the arch. Dominant left transverse and sigmoid  sinuses as well as a prominent asymmetric left tentorial vein. Delayed phase: No abnormal enhancement identified. Review of the MIP images confirms the above findings IMPRESSION: 1. Radiographic-String-Sign Stenosis of the proximal Left ICA along a segment of about 14 mm due to bulky calcified plaque. Associated decreased caliber of the Left ICA. Superimposed downstream moderate to severe multifocal Left ICA siphon stenosis due to calcified plaque. 2. High-grade proximal Right ICA stenosis also due to bulky calcified plaque is numerically estimated at at least 75%, and appears to approach are Radiographic-String-Sign on some images. Mild to moderate Right ICA siphon stenosis due to calcified plaque. 3. Dominant Right Vertebral Artery with moderate to severe stenosis at its origin due to calcified plaque. Additional right vertebral calcified plaque without stenosis. 4. Non dominant and diminutive Left vertebral artery which arises directly from the arch. 5. Mild to moderate stenosis at the Left ACA origin appears to be atherosclerotic. No other intracranial arterial stenosis. 6. Asymmetric left tentorial vein associated with the left basal vein of Rosenthal appears to be a normal anatomic variant. 7. No acute intracranial abnormality. Mild for age cerebral white matter disease. 8. Aortic Atherosclerosis (ICD10-I70.0) and Emphysema (ICD10-J43.9). Electronically Signed   By: Genevie Ann M.D.   On: 03/20/2017 10:14   Ct Foot Right Wo Contrast  Result Date: 04/06/2017 CLINICAL DATA:  Right foot pain at the first, second and third toes. Ulceration on the right foot. Diabetic patient. EXAM: CT OF THE RIGHT FOOT WITHOUT CONTRAST TECHNIQUE: Multidetector CT imaging of the right foot was performed according to the standard protocol. Multiplanar CT image reconstructions were also generated. COMPARISON:  Plain films right foot 04/04/2017. FINDINGS: Bones/Joint/Cartilage No acute bony or joint abnormality is seen. No bony  destructive change or periosteal reaction. Ligaments Suboptimally assessed by CT. Muscles and Tendons Appear normal. Soft tissues Subcutaneous edema is present about foot. No fluid collection or soft tissue gas. IMPRESSION: Diffuse soft tissue swelling about the foot. The  exam is otherwise negative. Electronically Signed   By: Inge Rise M.D.   On: 04/06/2017 10:44   Dg Foot Complete Right  Result Date: 04/04/2017 CLINICAL DATA:  Diabetic foot ulcer for several weeks. Right foot pain. EXAM: RIGHT FOOT COMPLETE - 3+ VIEW COMPARISON:  None. FINDINGS: Osseous alignment is normal. Bone mineralization is normal. No fracture line or displaced fracture fragment identified. No acute or suspicious osseous lesion. No destructive change to suggest osteomyelitis. Soft tissues about the right foot are unremarkable. IMPRESSION: Negative. Electronically Signed   By: Franki Cabot M.D.   On: 04/04/2017 20:48   Dg Toe 2nd Right  Result Date: 03/29/2017 CLINICAL DATA:  Swelling and infection of the right second toe. EXAM: RIGHT SECOND TOE COMPARISON:  None. FINDINGS: There is no evidence of fracture or dislocation. There is no evidence of osteolytic changes. Diffuse soft tissue swelling of the second digit. IMPRESSION: No radiographic evidence of osteomyelitis. Diffuse soft tissue swelling of the second digit without soft tissue emphysema. Electronically Signed   By: Fidela Salisbury M.D.   On: 03/29/2017 22:08    Lab Data:  CBC: Recent Labs  Lab 04/04/17 1347 04/05/17 0456 04/06/17 1019 04/07/17 0803 04/07/17 1130 04/08/17 0322  WBC 8.8 7.8 6.4 6.2 5.8 11.1*  NEUTROABS 6.2  --   --   --   --   --   HGB 14.5 14.2 14.7 15.4* 14.9 11.6*  HCT 43.1 43.3 45.4 46.1* 45.2 35.9*  MCV 83.0 83.8 82.8 82.5 83.1 83.9  PLT 147* 128* 130* 107* 134* 562*   Basic Metabolic Panel: Recent Labs  Lab 04/05/17 0456 04/06/17 1019 04/07/17 0803 04/07/17 1130 04/08/17 0322  NA 134* 132* 134* 134* 133*  K 3.9 4.0  4.3 4.1 4.1  CL 96* 95* 94* 95* 97*  CO2 26 24 25 26 26   GLUCOSE 106* 106* 104* 126* 169*  BUN 5* 6 8 6 10   CREATININE 0.46 0.53 0.48 0.51 0.54  CALCIUM 9.5 9.6 9.4 9.5 8.6*   GFR: Estimated Creatinine Clearance: 95.1 mL/min (by C-G formula based on SCr of 0.54 mg/dL). Liver Function Tests: Recent Labs  Lab 04/04/17 1347 04/07/17 1130  AST 23 33  ALT 20 27  ALKPHOS 105 69  BILITOT 0.6 0.6  PROT 6.3* 6.9  ALBUMIN 3.6 3.8   No results for input(s): LIPASE, AMYLASE in the last 168 hours. No results for input(s): AMMONIA in the last 168 hours. Coagulation Profile: Recent Labs  Lab 04/07/17 1130  INR 1.02   Cardiac Enzymes: No results for input(s): CKTOTAL, CKMB, CKMBINDEX, TROPONINI in the last 168 hours. BNP (last 3 results) No results for input(s): PROBNP in the last 8760 hours. HbA1C: No results for input(s): HGBA1C in the last 72 hours. CBG: Recent Labs  Lab 04/07/17 1746 04/07/17 2029 04/07/17 2212 04/08/17 0548 04/08/17 1126  GLUCAP 174* 222* 226* 176* 149*   Lipid Profile: No results for input(s): CHOL, HDL, LDLCALC, TRIG, CHOLHDL, LDLDIRECT in the last 72 hours. Thyroid Function Tests: No results for input(s): TSH, T4TOTAL, FREET4, T3FREE, THYROIDAB in the last 72 hours. Anemia Panel: No results for input(s): VITAMINB12, FOLATE, FERRITIN, TIBC, IRON, RETICCTPCT in the last 72 hours. Urine analysis:    Component Value Date/Time   COLORURINE YELLOW 04/05/2017 [place] 04/05/2017 0928   LABSPEC 1.008 04/05/2017 0928   PHURINE 7.0 04/05/2017 0928   GLUCOSEU NEGATIVE 04/05/2017 0928   HGBUR NEGATIVE 04/05/2017 0928   BILIRUBINUR NEGATIVE 04/05/2017 0928   BILIRUBINUR  neg 01/28/2013 [place] 04/05/2017 0928   PROTEINUR NEGATIVE 04/05/2017 0928   UROBILINOGEN negative 01/28/2013 1327   UROBILINOGEN 1.0 08/09/2007 0200   NITRITE NEGATIVE 04/05/2017 0928   LEUKOCYTESUR NEGATIVE 04/05/2017 0928     Abdul Beirne  M.D. [facility] 04/08/2017, 1:57 PM  Pager: [phone] Between 7am to 7pm - call Pager - [phone]  After 7pm go to [email or web address] - password [email or web address]  Call night coverage person covering after 7pm

## 2017-04-08 NOTE — Evaluation (Signed)
Physical Therapy Evaluation Patient Details Name: [name] [name] MRN: [id number] DOB: 1956-02-06 Today's Date: 04/08/2017   History of Present Illness  62 y.o. female with history of peripheral vascular disease, diabetes mellitus type 2, hyperlipidemia, tobacco abuse, thrombocytopenia, hypertension who was recently admitted and discharged on January 10 for right leg cellulitis and second toe gangrene was instructed to come to the ER after patient's blood cultures drawn during that stay grew Streptococcus variance in both bottles. s/p RIGHT FEMORAL-BELOW POPLITEAL ARTERY BYPASS WITH VEIN, RIGHT COMMON AND PRFUNDA FEMORAL ARTERY ENDARTERECTOMY WITH VEIN PATCH ANGIOPLASTY (Right Leg Lower) 04/07/17  Clinical Impression  Patient is s/p above surgery resulting in functional limitations due to the deficits listed below (see PT Problem List). Prior to Abdominal aortogram in 02/2017 pt working, driving and independent in all ADLs and iADLs, Pt is currently minA for bed mobility and minAx2 for transfers and ambulation of 3 feet with RW. Pt would benefit from staying with sister at d/c for 24 hr care and handicap accessible house. Patient will benefit from skilled PT to increase their independence and safety with mobility to allow discharge to the venue listed below.       Follow Up Recommendations Supervision/Assistance - 24 hour;Home health PT    Equipment Recommendations  Rolling walker with 5" wheels    Recommendations for Other Services       Precautions / Restrictions Precautions Precautions: None Restrictions Weight Bearing Restrictions: No      Mobility  Bed Mobility Overal bed mobility: Needs Assistance Bed Mobility: Supine to Sit     Supine to sit: Min assist;+2 for safety/equipment;HOB elevated     General bed mobility comments: minA for trunk to upright  Transfers Overall transfer level: Needs assistance Equipment used: Rolling walker (2 wheeled) Transfers: Sit to/from  Stand Sit to Stand: Min assist;From elevated surface;+2 safety/equipment;+2 physical assistance         General transfer comment: minAx2 for power up and steadying with RW, vc for hand placement for power up   Ambulation/Gait Ambulation/Gait assistance: Min assist;+2 physical assistance;+2 safety/equipment Ambulation Distance (Feet): 3 Feet Assistive device: Rolling walker (2 wheeled) Gait Pattern/deviations: Decreased step length - right;Decreased step length - left;Step-to pattern;Decreased weight shift to right;Decreased stance time - right;Shuffle Gait velocity: slowed Gait velocity interpretation: Below normal speed for age/gender General Gait Details: minA for steadying with RW, vc for weightbearing through B UE, slight LoB with backing up to recliner requiring minA for steadying     Balance Overall balance assessment: Needs assistance Sitting-balance support: Feet supported;No upper extremity supported Sitting balance-Leahy Scale: Fair     Standing balance support: Bilateral upper extremity supported Standing balance-Leahy Scale: Poor Standing balance comment: requires RW and minAx2                             Pertinent Vitals/Pain Pain Assessment: 0-10 Pain Score: 9  Pain Location: R LE Pain Descriptors / Indicators: Grimacing;Guarding;Throbbing;Operative site guarding;Aching;Constant Pain Intervention(s): Limited activity within patient's tolerance;Monitored during session;Repositioned    Home Living Family/patient expects to be discharged to:: Private residence Living Arrangements: Alone Available Help at Discharge: Family;Available 24 hours/day(if she goes to sisters ) Type of Home: Mobile home Home Access: Stairs to enter   Entrance Stairs-Number of Steps: 3 Home Layout: One level   Additional Comments: able to stay with sister with walk in shower and ramped     Prior Function Level of Independence: Independent  Comments: worked  until 12/19, limited community ambulation with out AD, driving and independent with ADLs         Extremity/Trunk Assessment   Upper Extremity Assessment Upper Extremity Assessment: Overall WFL for tasks assessed    Lower Extremity Assessment Lower Extremity Assessment: RLE deficits/detail;Generalized weakness RLE Deficits / Details: R gangrenous toe and R foot cellulitis compounded by surgical incisionall pain limiting pt AROM RLE: Unable to fully assess due to pain RLE Sensation: history of peripheral neuropathy LLE Sensation: history of peripheral neuropathy       Communication   Communication: No difficulties  Cognition Arousal/Alertness: Awake/alert Behavior During Therapy: WFL for tasks assessed/performed Overall Cognitive Status: Within Functional Limits for tasks assessed                                        General Comments General comments (skin integrity, edema, etc.): pt on 2L O2 via nasal cannula at entry, SaO2 90% with movement pt holds her breath with pain and SaO2 dropped to 82%O2, vc for breathing through nose and SaO2 rebounded to 93%O2, max HR with movement 137 bpm         Assessment/Plan    PT Assessment Patient needs continued PT services  PT Problem List Decreased strength;Decreased range of motion;Decreased activity tolerance;Decreased balance;Decreased mobility;Decreased knowledge of use of DME;Decreased safety awareness;Cardiopulmonary status limiting activity;Pain;Impaired sensation       PT Treatment Interventions DME instruction;Gait training;Functional mobility training;Therapeutic activities;Therapeutic exercise;Balance training;Patient/family education    PT Goals (Current goals can be found in the Care Plan section)  Acute Rehab PT Goals Patient Stated Goal: feel better PT Goal Formulation: With patient/family Time For Goal Achievement: 04/22/17 Potential to Achieve Goals: Good    Frequency Min 3X/week   Barriers to  discharge Decreased caregiver support      Co-evaluation PT/OT/SLP Co-Evaluation/Treatment: Yes Reason for Co-Treatment: For patient/therapist safety;To address functional/ADL transfers PT goals addressed during session: Mobility/safety with mobility;Balance;Proper use of DME         AM-PAC PT "6 Clicks" Daily Activity  Outcome Measure Difficulty turning over in bed (including adjusting bedclothes, sheets and blankets)?: A Lot Difficulty moving from lying on back to sitting on the side of the bed? : Unable Difficulty sitting down on and standing up from a chair with arms (e.g., wheelchair, bedside commode, etc,.)?: Unable Help needed moving to and from a bed to chair (including a wheelchair)?: A Little Help needed walking in hospital room?: A Lot Help needed climbing 3-5 steps with a railing? : Total 6 Click Score: 10    End of Session Equipment Utilized During Treatment: Gait belt;Oxygen Activity Tolerance: Patient limited by pain Patient left: in chair;with call bell/phone within reach;with chair alarm set;with family/visitor present Nurse Communication: Mobility status PT Visit Diagnosis: Unsteadiness on feet (R26.81);Other abnormalities of gait and mobility (R26.89);Muscle weakness (generalized) (M62.81);Difficulty in walking, not elsewhere classified (R26.2);Other (comment)    Time: 1856-3149 PT Time Calculation (min) (ACUTE ONLY): 43 min   Charges:   PT Evaluation $PT Eval Moderate Complexity: 1 Mod PT Treatments $Therapeutic Activity: 8-22 mins   PT G Codes:        Analyssa Downs B. Migdalia Dk PT, DPT Acute Rehabilitation  [phone] Pager [phone]    [place] 04/08/2017, 4:24 PM

## 2017-04-08 NOTE — Evaluation (Signed)
Occupational Therapy Evaluation Patient Details Name: [name] [name] MRN: [id number] DOB: Oct 30, 1955 Today's Date: 04/08/2017    History of Present Illness 62 y.o. female with history of peripheral vascular disease, diabetes mellitus type 2, hyperlipidemia, tobacco abuse, thrombocytopenia, hypertension who was recently admitted and discharged on January 10 for right leg cellulitis and second toe gangrene was instructed to come to the ER after patient's blood cultures drawn during that stay grew Streptococcus variance in both bottles. s/p RIGHT FEMORAL-BELOW POPLITEAL ARTERY BYPASS WITH VEIN, RIGHT COMMON AND PRFUNDA FEMORAL ARTERY ENDARTERECTOMY WITH VEIN PATCH ANGIOPLASTY (Right Leg Lower) 04/07/17   Clinical Impression   PTA, pt was able to complete ADL with difficulty but independently. She was able to work until December 2018. Pt initially hesitant to participate in session but willing to engage with encouragement. She currently requires max assist for LB ADL, min assist +2 for toilet transfers taking a few pivotal steps, and set-up for seated UB ADL. Pt limited this session by pain and decreased activity tolerance for ADL and functional mobility. She reports a plan to D/C to her sister's home and will have 24 hour assistance. She would benefit from continued OT services while admitted to improve independence and safety with ADL and functional mobility. Will continue to follow while admitted.     Follow Up Recommendations  Home health OT;Supervision/Assistance - 24 hour    Equipment Recommendations  3 in 1 bedside commode;Tub/shower bench    Recommendations for Other Services       Precautions / Restrictions Precautions Precautions: None Restrictions Weight Bearing Restrictions: No      Mobility Bed Mobility Overal bed mobility: Needs Assistance Bed Mobility: Supine to Sit     Supine to sit: Min assist;+2 for safety/equipment;HOB elevated     General bed mobility comments:  minA for trunk to upright  Transfers Overall transfer level: Needs assistance Equipment used: Rolling walker (2 wheeled) Transfers: Sit to/from Stand Sit to Stand: Min assist;From elevated surface;+2 safety/equipment;+2 physical assistance         General transfer comment: minAx2 for power up and steadying with RW, vc for hand placement for power up     Balance Overall balance assessment: Needs assistance Sitting-balance support: Feet supported;No upper extremity supported Sitting balance-Leahy Scale: Fair     Standing balance support: Bilateral upper extremity supported Standing balance-Leahy Scale: Poor Standing balance comment: requires RW and minAx2                           ADL either performed or assessed with clinical judgement   ADL Overall ADL's : Needs assistance/impaired Eating/Feeding: Set up;Sitting   Grooming: Set up;Sitting   Upper Body Bathing: Set up;Sitting   Lower Body Bathing: Sit to/from stand;Total assistance   Upper Body Dressing : Set up;Sitting   Lower Body Dressing: Sit to/from stand;Total assistance   Toilet Transfer: Minimal assistance;+2 for safety/equipment;RW   Toileting- Clothing Manipulation and Hygiene: Sit to/from stand;Moderate assistance       Functional mobility during ADLs: Minimal assistance;+2 for safety/equipment;Rolling walker General ADL Comments: Pt initially resistant to participate but performed well with encouragement.      Vision Patient Visual Report: No change from baseline Vision Assessment?: No apparent visual deficits     Perception     Praxis      Pertinent Vitals/Pain Pain Assessment: 0-10 Pain Score: 9  Pain Location: R LE Pain Descriptors / Indicators: Grimacing;Guarding;Throbbing;Operative site guarding;Aching;Constant Pain Intervention(s): Limited activity within  patient's tolerance;Monitored during session;Repositioned     Hand Dominance     Extremity/Trunk Assessment Upper  Extremity Assessment Upper Extremity Assessment: Overall WFL for tasks assessed   Lower Extremity Assessment Lower Extremity Assessment: RLE deficits/detail RLE Deficits / Details: R gangrenous toe and R foot cellulitis compounded by surgical incisionall pain limiting pt AROM RLE: Unable to fully assess due to pain RLE Sensation: history of peripheral neuropathy LLE Sensation: history of peripheral neuropathy       Communication Communication Communication: No difficulties   Cognition Arousal/Alertness: Awake/alert Behavior During Therapy: WFL for tasks assessed/performed Overall Cognitive Status: Within Functional Limits for tasks assessed                                     General Comments  Pt on 2L supplemental O2 throughout session. SpO2 90% with movement as pt holds her breath. With transfer, SpO2 desaturation to 82% with poor wave form and rebound to 93% with cues for pursed lip breathing. Max HR with movement 137 bpm.     Exercises     Shoulder Instructions      Home Living Family/patient expects to be discharged to:: Private residence Living Arrangements: Alone Available Help at Discharge: Family;Available 24 hours/day(if she goes to sisters ) Type of Home: Mobile home Home Access: Stairs to enter [employer] of Steps: 3   Home Layout: One level     Bathroom Shower/Tub: Teacher, early years/pre: Standard Bathroom Accessibility: Yes       Additional Comments: able to stay with sister with walk in shower and ramped       Prior Functioning/Environment Level of Independence: Independent        Comments: worked until 12/19, limited community ambulation with out AD, driving and independent with ADLs         OT Problem List: Decreased strength;Decreased range of motion;Decreased activity tolerance;Impaired balance (sitting and/or standing);Decreased safety awareness;Decreased knowledge of use of DME or AE;Decreased  knowledge of precautions;Pain      OT Treatment/Interventions: Self-care/ADL training;Therapeutic exercise;Energy conservation;DME and/or AE instruction;Therapeutic activities;Patient/family education;Balance training    OT Goals(Current goals can be found in the care plan section) Acute Rehab OT Goals Patient Stated Goal: feel better OT Goal Formulation: With patient Time For Goal Achievement: 04/22/17 Potential to Achieve Goals: Good ADL Goals Pt Will Perform Grooming: with modified independence;standing Pt Will Perform Lower Body Dressing: with supervision;sit to/from stand Pt Will Transfer to Toilet: with supervision;ambulating;bedside commode(BSC over toilet) Pt Will Perform Toileting - Clothing Manipulation and hygiene: with supervision;sit to/from stand Pt Will Perform Tub/Shower Transfer: with min assist;ambulating;tub bench;3 in 1;rolling walker;Tub transfer  OT Frequency: Min 2X/week   Barriers to D/C:            Co-evaluation PT/OT/SLP Co-Evaluation/Treatment: Yes Reason for Co-Treatment: For patient/therapist safety;To address functional/ADL transfers PT goals addressed during session: Mobility/safety with mobility OT goals addressed during session: ADL's and self-care;Proper use of Adaptive equipment and DME      AM-PAC PT "6 Clicks" Daily Activity     Outcome Measure Help from another person eating meals?: A Little Help from another person taking care of personal grooming?: A Little Help from another person toileting, which includes using toliet, bedpan, or urinal?: A Lot Help from another person bathing (including washing, rinsing, drying)?: A Lot Help from another person to put on and taking off regular upper body clothing?: A Little Help  from another person to put on and taking off regular lower body clothing?: A Lot 6 Click Score: 15   End of Session Equipment Utilized During Treatment: Gait belt;Rolling walker Nurse Communication: Mobility  status  Activity Tolerance: Patient tolerated treatment well Patient left: with call bell/phone within reach;in chair;with family/visitor present  OT Visit Diagnosis: Other abnormalities of gait and mobility (R26.89);Pain Pain - Right/Left: Right(bilateral) Pain - part of body: Leg(R>L)                Time: 1428-1510 OT Time Calculation (min): 42 min Charges:  OT General Charges $OT Visit: 1 Visit OT Evaluation $OT Eval Moderate Complexity: 1 Mod G-Codes:     Norman Herrlich, MS OTR/L  Pager: [place]lyn A Alanii Ramer 04/08/2017, 5:50 PM

## 2017-04-08 NOTE — Progress Notes (Signed)
Per RN report MD allowed patient to keep foley today and will take out tomorrow 03/2017.

## 2017-04-08 NOTE — Plan of Care (Signed)
  Progressing Education: Knowledge of General Education information will improve 04/08/2017 2302 - Progressing by Blair Promise, RN Health Behavior/Discharge Planning: Ability to manage health-related needs will improve 04/08/2017 2302 - Progressing by Blair Promise, RN Clinical Measurements: Ability to maintain clinical measurements within normal limits will improve 04/08/2017 2302 - Progressing by Blair Promise, RN Will remain free from infection 04/08/2017 2302 - Progressing by Blair Promise, RN Diagnostic test results will improve 04/08/2017 2302 - Progressing by Blair Promise, RN Respiratory complications will improve 04/08/2017 2302 - Progressing by Blair Promise, RN Cardiovascular complication will be avoided 04/08/2017 2302 - Progressing by Blair Promise, RN Activity: Risk for activity intolerance will decrease 04/08/2017 2302 - Progressing by Blair Promise, RN Nutrition: Adequate nutrition will be maintained 04/08/2017 2302 - Progressing by Blair Promise, RN Coping: Level of anxiety will decrease 04/08/2017 2302 - Progressing by Blair Promise, RN Elimination: Will not experience complications related to bowel motility 04/08/2017 2302 - Progressing by Blair Promise, RN Will not experience complications related to urinary retention 04/08/2017 2302 - Progressing by Blair Promise, RN Pain Managment: General experience of comfort will improve 04/08/2017 2302 - Progressing by Blair Promise, RN Safety: Ability to remain free from injury will improve 04/08/2017 2302 - Progressing by Blair Promise, RN Skin Integrity: Risk for impaired skin integrity will decrease 04/08/2017 2302 - Progressing by Blair Promise, RN

## 2017-04-08 NOTE — Progress Notes (Addendum)
    Subjective  - POD #1. S/p right fem-K pop BPG with vein and right femoral endarterectomy and VPA  Foot feels and looks better   Physical Exam:  Incisional vac in place Brisk DP/PT signals on right Incisions c/d/i       Assessment/Plan:  POD #1  Doing well continue abx Wound care  Needs to mobilize and get OOB Hb stable after surgery  lovenox for DVT prophylaxis  [name] [name] [name] [name] 04/08/2017 8:54 AM --  Vitals:   04/08/17 0700 04/08/17 0800  BP: (!) 145/57 125/64  Pulse: 100   Resp: 20 18  Temp: 98.4 F (36.9 C)   SpO2: 100% 97%    Intake/Output Summary (Last 24 hours) at 04/08/2017 0854 Last data filed at 04/08/2017 0700 Gross per 24 hour  Intake 3970 ml  Output 1200 ml  Net 2770 ml     Laboratory CBC    Component Value Date/Time   WBC 11.1 (H) 04/08/2017 0322   HGB 11.6 (L) 04/08/2017 0322   HCT 35.9 (L) 04/08/2017 0322   PLT 121 (L) 04/08/2017 0322    BMET    Component Value Date/Time   NA 133 (L) 04/08/2017 0322   K 4.1 04/08/2017 0322   CL 97 (L) 04/08/2017 0322   CO2 26 04/08/2017 0322   GLUCOSE 169 (H) 04/08/2017 0322   BUN 10 04/08/2017 0322   CREATININE 0.54 04/08/2017 0322   CREATININE 0.69 01/28/2013 1110   CALCIUM 8.6 (L) 04/08/2017 0322   GFRNONAA >60 04/08/2017 0322   GFRAA >60 04/08/2017 0322    COAG Lab Results  Component Value Date   INR 1.02 04/07/2017   INR 1.06 03/30/2017   No results found for: PTT  Antibiotics Anti-infectives (From admission, onward)   Start     Dose/Rate Route Frequency Ordered Stop   04/07/17 1110  vancomycin (VANCOCIN) 1-5 GM/200ML-% IVPB    Comments:  Schonewitz, Leigh   : cabinet override      04/07/17 1110 04/07/17 1315   04/07/17 1103  vancomycin (VANCOCIN) IVPB 1000 mg/200 mL premix     1,000 mg 200 mL/hr over 60 Minutes Intravenous 60 min pre-op 04/07/17 1103 04/07/17 1415   04/05/17 0400  cefTRIAXone (ROCEPHIN) 2 g in dextrose 5 % 50 mL IVPB     2 g 100 mL/hr over 30 Minutes  Intravenous Every 24 hours 04/04/17 2111     04/04/17 2000  ceFEPIme (MAXIPIME) 2 g in dextrose 5 % 50 mL IVPB  Status:  Discontinued     2 g 100 mL/hr over 30 Minutes Intravenous Every 8 hours 04/04/17 1959 04/04/17 2111       V. Leia Alf, M.D. [facility] Office: [phone] Pager:  [phone]

## 2017-04-09 LAB — CULTURE, BLOOD (ROUTINE X 2)
Culture: NO GROWTH
Culture: NO GROWTH
SPECIAL REQUESTS: ADEQUATE
Special Requests: ADEQUATE

## 2017-04-09 LAB — GLUCOSE, CAPILLARY
GLUCOSE-CAPILLARY: 109 mg/dL — AB (ref 65–99)
GLUCOSE-CAPILLARY: 145 mg/dL — AB (ref 65–99)
GLUCOSE-CAPILLARY: 187 mg/dL — AB (ref 65–99)
Glucose-Capillary: 118 mg/dL — ABNORMAL HIGH (ref 65–99)

## 2017-04-09 MED ORDER — IPRATROPIUM-ALBUTEROL 0.5-2.5 (3) MG/3ML IN SOLN
3.0000 mL | Freq: Three times a day (TID) | RESPIRATORY_TRACT | Status: DC
  Administered 2017-04-09 – 2017-04-10 (×4): 3 mL via RESPIRATORY_TRACT
  Filled 2017-04-09 (×4): qty 3

## 2017-04-09 NOTE — Progress Notes (Addendum)
  Progress Note    04/09/2017 8:32 AM 2 Days Post-Op  Subjective:  Soreness at groin incision; patient believes she is chafing    Vitals:   04/09/17 0351 04/09/17 0753  BP: 124/87 129/61  Pulse:  (!) 105  Resp: 19 16  Temp: 98.3 F (36.8 C)   SpO2: (!) 88% 92%   Physical Exam: Lungs:  Non labored Incisions:  R groin incisional vac in place; R medial thigh and popliteal incisions clean,dry, and intact Extremities:  Strong R PT by doppler, faint R AT; R 2nd toe tip dry gangrene but stable Abdomen:  Soft Neurologic: A&O  CBC    Component Value Date/Time   WBC 11.1 (H) 04/08/2017 0322   RBC 4.28 04/08/2017 0322   HGB 11.6 (L) 04/08/2017 0322   HCT 35.9 (L) 04/08/2017 0322   PLT 121 (L) 04/08/2017 0322   MCV 83.9 04/08/2017 0322   MCH 27.1 04/08/2017 0322   MCHC 32.3 04/08/2017 0322   RDW 15.1 04/08/2017 0322   LYMPHSABS 1.6 04/04/2017 1347   MONOABS 0.7 04/04/2017 1347   EOSABS 0.1 04/04/2017 1347   BASOSABS 0.0 04/04/2017 1347    BMET    Component Value Date/Time   NA 133 (L) 04/08/2017 0322   K 4.1 04/08/2017 0322   CL 97 (L) 04/08/2017 0322   CO2 26 04/08/2017 0322   GLUCOSE 169 (H) 04/08/2017 0322   BUN 10 04/08/2017 0322   CREATININE 0.54 04/08/2017 0322   CREATININE 0.69 01/28/2013 1110   CALCIUM 8.6 (L) 04/08/2017 0322   GFRNONAA >60 04/08/2017 0322   GFRAA >60 04/08/2017 0322    INR    Component Value Date/Time   INR 1.02 04/07/2017 1130     Intake/Output Summary (Last 24 hours) at 04/09/2017 5465 Last data filed at 04/09/2017 0618 Gross per 24 hour  Intake 1935 ml  Output 600 ml  Net 1335 ml     Assessment/Plan:  62 y.o. female is s/p right fem-K pop BPG with vein and right femoral endarterectomy and VPA   2 Days Post-Op   Continue wound vac in R groin despite discomfort D/c foley; encouraged ambulation Continue abx D/c when ambulating without difficulty   [name] Ligas, PA-C [facility] [phone] 04/09/2017 8:32 AM   Agree with the above.  Needs to mobilize.  [name] [name]

## 2017-04-09 NOTE — Progress Notes (Signed)
[facility]                                                                              Patient Demographics  [name] [name], is a 62 y.o. female, DOB - [birth year], KNL:[id number]  Admit date - 04/04/2017   Admitting Physician [name] Patience, MD  Outpatient Primary MD for the patient is [name] Pi, MD  Outpatient specialists:   LOS - 5  days   Medical records reviewed and are as summarized below:    Chief Complaint  Patient presents with  . Abnormal Lab       Brief summary    [name] [name] is a 62 y.o. female with history of peripheral vascular disease, diabetes mellitus type 2, hyperlipidemia, tobacco abuse, thrombocytopenia, hypertension who was recently admitted and discharged on January 10 for right leg cellulitis and second toe gangrene was instructed to come to the ER after patient's blood cultures drawn during that stay grew Streptococcus variance in both bottles.  Patient was discharged home on Keflex.  Patient denied any fever chills after discharge.  Vascular surgeon Dr. Trula [name] was planning to have patient's carotid surgery this week.  Assessment & Plan    Principal Problem:   Bacteremia in the setting of gangrenous second right toe and right foot cellulitis - X-ray negative for osteomyelitis - Blood cultures positive for Streptococcus meridians 2/2, 03/30/17, repeat blood cultures 1/15 negative so far - Continue IV Rocephin for now, appreciate ID recommendations - Patient had abdominal aortogram with lower extremity on 02/28/17 due to bilateral leg pain with ulcers, which had shown no significant aortoiliac occlusive disease, high-grade calcific stenotic lesions in the right common femoral artery with superficial femoral artery occlusion, occluded left superficial femoral artery with reconstitution of above-knee popliteal artery.  Was recommended bypass by vascular surgery. -CT right foot showed osteomyelitis, vascular surgery was  consulted due to severe PVD  -Patient underwent right fem-k Pop bypass, postop day #2 - Pain getting better, once Foley out, continue antibiotics, PTOT  Active Problems:   DM (diabetes mellitus), type 2 with peripheral vascular complications (HCC) -CBGs controlled, continue novoLog 70/30 50units BID    Hypertension -BP stable today, continue to hold HCTZ, continue  lisinopril 5 mg daily  Chronic thrombocytopenia (HCC) -PLT count stable, monitor closely    PVD (peripheral vascular disease) ([place]), critical carotid stenosis -Vascular surgery following, postponed carotid surgery due to #1.   Code Status: Full CODE STATUS DVT Prophylaxis: Heparin subcu Family Communication: Discussed in detail with the patient, all imaging results, lab results explained to the patient   Disposition Plan:   Time Spent in minutes  25 minutes  Procedures:   04/07/17 : right fem-K pop BPG with vein and right femoral endarterectomy and VPA   Consultants:   ID Vascular surgery  Antimicrobials:   IV Rocephin 1/15   Medications  Scheduled Meds: . aspirin EC  81 mg Oral Daily  . atorvastatin  20 mg Oral Daily  . enoxaparin (LOVENOX) injection  60 mg Subcutaneous Q24H  . folic acid  1 mg Oral Daily  . gabapentin  300 mg Oral BID  . insulin  aspart  0-9 Units Subcutaneous TID WC  . insulin aspart protamine- aspart  50 Units Subcutaneous BID  . ipratropium-albuterol  3 mL Nebulization TID  . lisinopril  5 mg Oral Daily  . pantoprazole  40 mg Oral Daily  . sertraline  100 mg Oral Daily   Continuous Infusions: . cefTRIAXone (ROCEPHIN)  IV Stopped (04/09/17 0425)   PRN Meds:.acetaminophen **OR** acetaminophen, guaiFENesin-dextromethorphan, hydrALAZINE, HYDROmorphone (DILAUDID) injection, labetalol, metoprolol tartrate, ondansetron **OR** ondansetron (ZOFRAN) IV, oxyCODONE-acetaminophen, phenol, pneumococcal 23 valent vaccine, polyethylene glycol   Antibiotics   Anti-infectives (From  admission, onward)   Start     Dose/Rate Route Frequency Ordered Stop   04/07/17 1110  vancomycin (VANCOCIN) 1-5 GM/200ML-% IVPB    Comments:  [name], [name]   : cabinet override      04/07/17 1110 04/07/17 1315   04/07/17 1103  vancomycin (VANCOCIN) IVPB 1000 mg/200 mL premix     1,000 mg 200 mL/hr over 60 Minutes Intravenous 60 min pre-op 04/07/17 1103 04/07/17 1415   04/05/17 0400  cefTRIAXone (ROCEPHIN) 2 g in dextrose 5 % 50 mL IVPB     2 g 100 mL/hr over 30 Minutes Intravenous Every 24 hours 04/04/17 2111     04/04/17 2000  ceFEPIme (MAXIPIME) 2 g in dextrose 5 % 50 mL IVPB  Status:  Discontinued     2 g 100 mL/hr over 30 Minutes Intravenous Every 8 hours 04/04/17 1959 04/04/17 2111        Subjective:   [name] [name] was seen and examined today. Wants Foley out. No new complaints, pain is getting better. No fevers or chills..  Patient denies dizziness, chest pain, shortness of breath, abdominal pain, N/V/D/C, new weakness, numbess, tingling.    Objective:   Vitals:   04/09/17 0006 04/09/17 0351 04/09/17 0753 04/09/17 0902  BP: (!) 112/51 124/87 129/61   Pulse: 71  (!) 105 (!) 106  Resp: 20 19 16 16   Temp: 98.2 F (36.8 C) 98.3 F (36.8 C)    TempSrc: Oral Oral Oral   SpO2: 96% (!) 88% 92% 94%  Weight:      Height:        Intake/Output Summary (Last 24 hours) at 04/09/2017 1102 Last data filed at 04/09/2017 8502 Gross per 24 hour  Intake 1585 ml  Output 600 ml  Net 985 ml     Wt Readings from Last 3 Encounters:  04/07/17 116.8 kg (257 lb 8 oz)  03/29/17 119.3 kg (263 lb)  03/13/17 119.3 kg (263 lb)     Exam    General: Alert and oriented x 3, NAD  Eyes:  HEENT:    Cardiovascular: S1 S2 clear, RRR No pedal edema b/l  Respiratory: Clear to auscultation bilaterally, no wheezing, rales or rhonchi  Gastrointestinal: Soft, nontender, nondistended, + bowel sounds  Ext: no pedal edema bilaterally  Neuro: no new deficits  Musculoskeletal: No  digital cyanosis, clubbing  Skin: Right foot cellulitis  Psych: Normal affect and demeanor, alert and oriented x3   Data Reviewed:  I have personally reviewed following labs and imaging studies  Micro Results Recent Results (from the past 240 hour(s))  Culture, blood (routine x 2)     Status: None (Preliminary result)   Collection Time: 04/04/17  8:06 PM  Result Value Ref Range Status   Specimen Description BLOOD RIGHT ANTECUBITAL  Final   Special Requests   Final    BOTTLES DRAWN AEROBIC AND ANAEROBIC Blood Culture adequate volume   Culture NO GROWTH  4 DAYS  Final   Report Status PENDING  Incomplete  Culture, blood (routine x 2)     Status: None (Preliminary result)   Collection Time: 04/04/17  9:52 PM  Result Value Ref Range Status   Specimen Description BLOOD LEFT ANTECUBITAL  Final   Special Requests   Final    BOTTLES DRAWN AEROBIC AND ANAEROBIC Blood Culture adequate volume   Culture NO GROWTH 4 DAYS  Final   Report Status PENDING  Incomplete  Surgical pcr screen     Status: None   Collection Time: 04/07/17 10:42 AM  Result Value Ref Range Status   MRSA, PCR NEGATIVE NEGATIVE Final   Staphylococcus aureus NEGATIVE NEGATIVE Final    Comment: (NOTE) The Xpert SA Assay (FDA approved for NASAL specimens in patients 70 years of age and older), is one component of a comprehensive surveillance program. It is not intended to diagnose infection nor to guide or monitor treatment.     Radiology Reports Ct Angio Head W Or Wo Contrast  Result Date: 03/20/2017 CLINICAL DATA:  62 year old female with evidence of left carotid short segment occlusion or near occlusion, and high-grade right ICA stenosis on carotid Doppler ultrasound 03/08/2017. Subsequent encounter. EXAM: CT ANGIOGRAPHY HEAD AND NECK TECHNIQUE: Multidetector CT imaging of the head and neck was performed using the standard protocol during bolus administration of intravenous contrast. Multiplanar CT image reconstructions  and MIPs were obtained to evaluate the vascular anatomy. Carotid stenosis measurements (when applicable) are obtained utilizing NASCET criteria, using the distal internal carotid diameter as the denominator. CONTRAST:  73mL ISOVUE-370 IOPAMIDOL (ISOVUE-370) INJECTION 76% COMPARISON:  Report of carotid Doppler ultrasound 03/08/2017. Head CT and cervical spine CT without contrast 06/05/2012. FINDINGS: CT HEAD Brain: Cerebral volume is stable since 2014 and normal for age. Mild for age scattered cerebral white matter hypodensity. No midline shift, ventriculomegaly, mass effect, evidence of mass lesion, intracranial hemorrhage or evidence of cortically based acute infarction. No cortical encephalomalacia identified. Calvarium and skull base: Negative; mild motion artifact at the skullbase. Paranasal sinuses: Clear. Orbits: Visualized orbits and scalp soft tissues are within normal limits. CTA NECK Skeleton: Absent dentition aside from impacted left maxillary wisdom tooth. No acute osseous abnormality identified. Upper chest: Centrilobular emphysema suspected. Otherwise negative visible lung parenchyma. No superior mediastinal lymphadenopathy. Other neck: Subcentimeter hypodense right anterior thyroid lobe nodule on series 5, image 84 does not meet consensus criteria for ultrasound follow-up. The glottis is closed and there is intermittent motion artifact in the pharynx. No neck mass or lymphadenopathy identified. Aortic arch: Calcified aortic atherosclerosis. Four vessel arch configuration, the left vertebral artery appears to arise directly from the arch (series 5, image 108). Right carotid system: No brachiocephalic artery or right CCA origin stenosis despite calcified plaque. The right CCA is then negative until the right carotid bifurcation where bulky calcified plaque occurs affecting both the right ICA and ECA origins. High-grade right ICA origin stenosis results, numerically estimated at 75 % with respect to the  distal vessel , and might be higher owing to the complex configuration of what appears to be both calcified and soft plaque on series 13, image 175. The cervical right ICA remains patent and has a normal caliber distally. Left carotid system: No left CCA origin stenosis despite calcified plaque. There is some circumferential soft plaque in the left CCA proximal to the bifurcation without stenosis. Bulky calcified plaque at the left carotid bifurcation continues into the left ICA origin and bulb with what appears essentially as  a radiographic string sign stenosis over a 13-14 mm segment of the vessel (series 12, image 138. The downstream left ICA caliber is diminished to about 3 mm diameter, but no bona fide occlusion of the vessel is identified. Vertebral arteries: No proximal right subclavian artery stenosis despite calcified plaque. Calcified plaque at the right vertebral artery origin with moderate to severe stenosis (series 11, image 157). The right vertebral artery is dominant. There is intermittent right V 2 segment calcified plaque with only mild associated stenosis. Similar mild right V3 segment calcified plaque without stenosis. The left vertebral artery is non dominant, diminutive, and arises directly from the arch. The left vertebral artery is only 1-2 mm diameter throughout its course but does remain patent into the posterior fossa. CTA HEAD Posterior circulation: Dominant distal right vertebral artery with mild V4 segment calcified plaque not resulting in stenosis. The right vertebral primarily supplies the basilar artery. Normal right PICA origin. Non dominant and diminutive left V4 segment is patent but thread-like. The left AICA appears dominant. No basilar artery stenosis. SCA origins are patent. There are fetal type bilateral PCA origins, the left P1 is larger. Bilateral PCA branches are within normal limits. There is an incidental prominent left basal vein of Rosenthal communicating with a large  left tentorial vein which appears to be a normal variant (series 16, image 55). No AVM is identified. Anterior circulation: Both ICA siphons are patent. On the right there is mild to moderate siphon calcified plaque with mild stenosis. Right ophthalmic and posterior communicating artery origins are normal. On the left there is moderate to severe calcified plaque with moderate stenosis of both the left cavernous and supraclinoid ICA segments. The left ophthalmic and posterior communicating artery origins are normal. Patent carotid termini. Normal MCA and right ACA origins. There is mild to moderate irregularity and stenosis at the left ACA origin. The A1 segments are fairly codominant. Anterior communicating artery and bilateral ACA branches are within normal limits. Left MCA M1 segment, bifurcation, and left MCA branches are within normal limits. Right MCA M1 segment, trifurcation, and right MCA branches are within normal limits. Venous sinuses: Patent. Prominent asymmetric left tentorial vein which appears to communicate with the left basal vein of Rosenthal stated above appears to be a normal variant. Dominant left transverse and sigmoid sinuses is a normal variant. Anatomic variants: Dominant right and diminutive left vertebral arteries. The left vertebral artery arises directly from the arch. Dominant left transverse and sigmoid sinuses as well as a prominent asymmetric left tentorial vein. Delayed phase: No abnormal enhancement identified. Review of the MIP images confirms the above findings IMPRESSION: 1. Radiographic-String-Sign Stenosis of the proximal Left ICA along a segment of about 14 mm due to bulky calcified plaque. Associated decreased caliber of the Left ICA. Superimposed downstream moderate to severe multifocal Left ICA siphon stenosis due to calcified plaque. 2. High-grade proximal Right ICA stenosis also due to bulky calcified plaque is numerically estimated at at least 75%, and appears to  approach are Radiographic-String-Sign on some images. Mild to moderate Right ICA siphon stenosis due to calcified plaque. 3. Dominant Right Vertebral Artery with moderate to severe stenosis at its origin due to calcified plaque. Additional right vertebral calcified plaque without stenosis. 4. Non dominant and diminutive Left vertebral artery which arises directly from the arch. 5. Mild to moderate stenosis at the Left ACA origin appears to be atherosclerotic. No other intracranial arterial stenosis. 6. Asymmetric left tentorial vein associated with the left basal vein of  Aubery Lapping appears to be a normal anatomic variant. 7. No acute intracranial abnormality. Mild for age cerebral white matter disease. 8. Aortic Atherosclerosis (ICD10-I70.0) and Emphysema (ICD10-J43.9). Electronically Signed   By: Genevie Ann M.D.   On: 03/20/2017 10:14   Ct Angio Neck W Or Wo Contrast  Result Date: 03/20/2017 CLINICAL DATA:  62 year old female with evidence of left carotid short segment occlusion or near occlusion, and high-grade right ICA stenosis on carotid Doppler ultrasound 03/08/2017. Subsequent encounter. EXAM: CT ANGIOGRAPHY HEAD AND NECK TECHNIQUE: Multidetector CT imaging of the head and neck was performed using the standard protocol during bolus administration of intravenous contrast. Multiplanar CT image reconstructions and MIPs were obtained to evaluate the vascular anatomy. Carotid stenosis measurements (when applicable) are obtained utilizing NASCET criteria, using the distal internal carotid diameter as the denominator. CONTRAST:  58mL ISOVUE-370 IOPAMIDOL (ISOVUE-370) INJECTION 76% COMPARISON:  Report of carotid Doppler ultrasound 03/08/2017. Head CT and cervical spine CT without contrast 06/05/2012. FINDINGS: CT HEAD Brain: Cerebral volume is stable since 2014 and normal for age. Mild for age scattered cerebral white matter hypodensity. No midline shift, ventriculomegaly, mass effect, evidence of mass lesion,  intracranial hemorrhage or evidence of cortically based acute infarction. No cortical encephalomalacia identified. Calvarium and skull base: Negative; mild motion artifact at the skullbase. Paranasal sinuses: Clear. Orbits: Visualized orbits and scalp soft tissues are within normal limits. CTA NECK Skeleton: Absent dentition aside from impacted left maxillary wisdom tooth. No acute osseous abnormality identified. Upper chest: Centrilobular emphysema suspected. Otherwise negative visible lung parenchyma. No superior mediastinal lymphadenopathy. Other neck: Subcentimeter hypodense right anterior thyroid lobe nodule on series 5, image 84 does not meet consensus criteria for ultrasound follow-up. The glottis is closed and there is intermittent motion artifact in the pharynx. No neck mass or lymphadenopathy identified. Aortic arch: Calcified aortic atherosclerosis. Four vessel arch configuration, the left vertebral artery appears to arise directly from the arch (series 5, image 108). Right carotid system: No brachiocephalic artery or right CCA origin stenosis despite calcified plaque. The right CCA is then negative until the right carotid bifurcation where bulky calcified plaque occurs affecting both the right ICA and ECA origins. High-grade right ICA origin stenosis results, numerically estimated at 75 % with respect to the distal vessel , and might be higher owing to the complex configuration of what appears to be both calcified and soft plaque on series 13, image 175. The cervical right ICA remains patent and has a normal caliber distally. Left carotid system: No left CCA origin stenosis despite calcified plaque. There is some circumferential soft plaque in the left CCA proximal to the bifurcation without stenosis. Bulky calcified plaque at the left carotid bifurcation continues into the left ICA origin and bulb with what appears essentially as a radiographic string sign stenosis over a 13-14 mm segment of the vessel  (series 12, image 138. The downstream left ICA caliber is diminished to about 3 mm diameter, but no bona fide occlusion of the vessel is identified. Vertebral arteries: No proximal right subclavian artery stenosis despite calcified plaque. Calcified plaque at the right vertebral artery origin with moderate to severe stenosis (series 11, image 157). The right vertebral artery is dominant. There is intermittent right V 2 segment calcified plaque with only mild associated stenosis. Similar mild right V3 segment calcified plaque without stenosis. The left vertebral artery is non dominant, diminutive, and arises directly from the arch. The left vertebral artery is only 1-2 mm diameter throughout its course but does remain patent  into the posterior fossa. CTA HEAD Posterior circulation: Dominant distal right vertebral artery with mild V4 segment calcified plaque not resulting in stenosis. The right vertebral primarily supplies the basilar artery. Normal right PICA origin. Non dominant and diminutive left V4 segment is patent but thread-like. The left AICA appears dominant. No basilar artery stenosis. SCA origins are patent. There are fetal type bilateral PCA origins, the left P1 is larger. Bilateral PCA branches are within normal limits. There is an incidental prominent left basal vein of Rosenthal communicating with a large left tentorial vein which appears to be a normal variant (series 16, image 55). No AVM is identified. Anterior circulation: Both ICA siphons are patent. On the right there is mild to moderate siphon calcified plaque with mild stenosis. Right ophthalmic and posterior communicating artery origins are normal. On the left there is moderate to severe calcified plaque with moderate stenosis of both the left cavernous and supraclinoid ICA segments. The left ophthalmic and posterior communicating artery origins are normal. Patent carotid termini. Normal MCA and right ACA origins. There is mild to moderate  irregularity and stenosis at the left ACA origin. The A1 segments are fairly codominant. Anterior communicating artery and bilateral ACA branches are within normal limits. Left MCA M1 segment, bifurcation, and left MCA branches are within normal limits. Right MCA M1 segment, trifurcation, and right MCA branches are within normal limits. Venous sinuses: Patent. Prominent asymmetric left tentorial vein which appears to communicate with the left basal vein of Rosenthal stated above appears to be a normal variant. Dominant left transverse and sigmoid sinuses is a normal variant. Anatomic variants: Dominant right and diminutive left vertebral arteries. The left vertebral artery arises directly from the arch. Dominant left transverse and sigmoid sinuses as well as a prominent asymmetric left tentorial vein. Delayed phase: No abnormal enhancement identified. Review of the MIP images confirms the above findings IMPRESSION: 1. Radiographic-String-Sign Stenosis of the proximal Left ICA along a segment of about 14 mm due to bulky calcified plaque. Associated decreased caliber of the Left ICA. Superimposed downstream moderate to severe multifocal Left ICA siphon stenosis due to calcified plaque. 2. High-grade proximal Right ICA stenosis also due to bulky calcified plaque is numerically estimated at at least 75%, and appears to approach are Radiographic-String-Sign on some images. Mild to moderate Right ICA siphon stenosis due to calcified plaque. 3. Dominant Right Vertebral Artery with moderate to severe stenosis at its origin due to calcified plaque. Additional right vertebral calcified plaque without stenosis. 4. Non dominant and diminutive Left vertebral artery which arises directly from the arch. 5. Mild to moderate stenosis at the Left ACA origin appears to be atherosclerotic. No other intracranial arterial stenosis. 6. Asymmetric left tentorial vein associated with the left basal vein of Rosenthal appears to be a normal  anatomic variant. 7. No acute intracranial abnormality. Mild for age cerebral white matter disease. 8. Aortic Atherosclerosis (ICD10-I70.0) and Emphysema (ICD10-J43.9). Electronically Signed   By: Genevie Ann M.D.   On: 03/20/2017 10:14   Ct Foot Right Wo Contrast  Result Date: 04/06/2017 CLINICAL DATA:  Right foot pain at the first, second and third toes. Ulceration on the right foot. Diabetic patient. EXAM: CT OF THE RIGHT FOOT WITHOUT CONTRAST TECHNIQUE: Multidetector CT imaging of the right foot was performed according to the standard protocol. Multiplanar CT image reconstructions were also generated. COMPARISON:  Plain films right foot 04/04/2017. FINDINGS: Bones/Joint/Cartilage No acute bony or joint abnormality is seen. No bony destructive change or periosteal reaction.  Ligaments Suboptimally assessed by CT. Muscles and Tendons Appear normal. Soft tissues Subcutaneous edema is present about foot. No fluid collection or soft tissue gas. IMPRESSION: Diffuse soft tissue swelling about the foot. The exam is otherwise negative. Electronically Signed   By: Inge [name] M.D.   On: 04/06/2017 10:44   Dg Foot Complete Right  Result Date: 04/04/2017 CLINICAL DATA:  Diabetic foot ulcer for several weeks. Right foot pain. EXAM: RIGHT FOOT COMPLETE - 3+ VIEW COMPARISON:  None. FINDINGS: Osseous alignment is normal. Bone mineralization is normal. No fracture line or displaced fracture fragment identified. No acute or suspicious osseous lesion. No destructive change to suggest osteomyelitis. Soft tissues about the right foot are unremarkable. IMPRESSION: Negative. Electronically Signed   By: Franki Cabot M.D.   On: 04/04/2017 20:48   Dg Toe 2nd Right  Result Date: 03/29/2017 CLINICAL DATA:  Swelling and infection of the right second toe. EXAM: RIGHT SECOND TOE COMPARISON:  None. FINDINGS: There is no evidence of fracture or dislocation. There is no evidence of osteolytic changes. Diffuse soft tissue swelling of  the second digit. IMPRESSION: No radiographic evidence of osteomyelitis. Diffuse soft tissue swelling of the second digit without soft tissue emphysema. Electronically Signed   By: Fidela Salisbury M.D.   On: 03/29/2017 22:08    Lab Data:  CBC: Recent Labs  Lab 04/04/17 1347 04/05/17 0456 04/06/17 1019 04/07/17 0803 04/07/17 1130 04/08/17 0322  WBC 8.8 7.8 6.4 6.2 5.8 11.1*  NEUTROABS 6.2  --   --   --   --   --   HGB 14.5 14.2 14.7 15.4* 14.9 11.6*  HCT 43.1 43.3 45.4 46.1* 45.2 35.9*  MCV 83.0 83.8 82.8 82.5 83.1 83.9  PLT 147* 128* 130* 107* 134* 659*   Basic Metabolic Panel: Recent Labs  Lab 04/05/17 0456 04/06/17 1019 04/07/17 0803 04/07/17 1130 04/08/17 0322  NA 134* 132* 134* 134* 133*  K 3.9 4.0 4.3 4.1 4.1  CL 96* 95* 94* 95* 97*  CO2 26 24 25 26 26   GLUCOSE 106* 106* 104* 126* 169*  BUN 5* 6 8 6 10   CREATININE 0.46 0.53 0.48 0.51 0.54  CALCIUM 9.5 9.6 9.4 9.5 8.6*   GFR: Estimated Creatinine Clearance: 95.1 mL/min (by C-G formula based on SCr of 0.54 mg/dL). Liver Function Tests: Recent Labs  Lab 04/04/17 1347 04/07/17 1130  AST 23 33  ALT 20 27  ALKPHOS 105 69  BILITOT 0.6 0.6  PROT 6.3* 6.9  ALBUMIN 3.6 3.8   No results for input(s): LIPASE, AMYLASE in the last 168 hours. No results for input(s): AMMONIA in the last 168 hours. Coagulation Profile: Recent Labs  Lab 04/07/17 1130  INR 1.02   Cardiac Enzymes: No results for input(s): CKTOTAL, CKMB, CKMBINDEX, TROPONINI in the last 168 hours. BNP (last 3 results) No results for input(s): PROBNP in the last 8760 hours. HbA1C: No results for input(s): HGBA1C in the last 72 hours. CBG: Recent Labs  Lab 04/08/17 0548 04/08/17 1126 04/08/17 1558 04/08/17 2135 04/09/17 0818  GLUCAP 176* 149* 152* 190* 109*   Lipid Profile: No results for input(s): CHOL, HDL, LDLCALC, TRIG, CHOLHDL, LDLDIRECT in the last 72 hours. Thyroid Function Tests: No results for input(s): TSH, T4TOTAL, FREET4,  T3FREE, THYROIDAB in the last 72 hours. Anemia Panel: No results for input(s): VITAMINB12, FOLATE, FERRITIN, TIBC, IRON, RETICCTPCT in the last 72 hours. Urine analysis:    Component Value Date/Time   COLORURINE YELLOW 04/05/2017 [place]  04/05/2017 0928   LABSPEC 1.008 04/05/2017 0928   PHURINE 7.0 04/05/2017 0928   GLUCOSEU NEGATIVE 04/05/2017 0928   HGBUR NEGATIVE 04/05/2017 0928   BILIRUBINUR NEGATIVE 04/05/2017 0928   BILIRUBINUR neg 01/28/2013 1327   [place] 04/05/2017 0928   PROTEINUR NEGATIVE 04/05/2017 0928   UROBILINOGEN negative 01/28/2013 1327   UROBILINOGEN 1.0 08/09/2007 0200   NITRITE NEGATIVE 04/05/2017 0928   LEUKOCYTESUR NEGATIVE 04/05/2017 0928     Aurianna Earlywine M.D. [facility] 04/09/2017, 11:02 AM  Pager: [phone] Between 7am to 7pm - call Pager - [phone]  After 7pm go to [email or web address] - password [email or web address]  Call night coverage person covering after 7pm

## 2017-04-10 ENCOUNTER — Inpatient Hospital Stay (HOSPITAL_COMMUNITY): Payer: BLUE CROSS/BLUE SHIELD

## 2017-04-10 DIAGNOSIS — Z9889 Other specified postprocedural states: Secondary | ICD-10-CM

## 2017-04-10 LAB — BASIC METABOLIC PANEL
Anion gap: 9 (ref 5–15)
BUN: 13 mg/dL (ref 6–20)
CHLORIDE: 94 mmol/L — AB (ref 101–111)
CO2: 24 mmol/L (ref 22–32)
Calcium: 8.5 mg/dL — ABNORMAL LOW (ref 8.9–10.3)
Creatinine, Ser: 0.6 mg/dL (ref 0.44–1.00)
GFR calc Af Amer: >60 mL/min (ref >60)
GLUCOSE: 155 mg/dL — AB (ref 65–99)
POTASSIUM: 4.1 mmol/L (ref 3.5–5.1)
Sodium: 127 mmol/L — ABNORMAL LOW (ref 135–145)

## 2017-04-10 LAB — CBC
HCT: 29.5 % — ABNORMAL LOW (ref 36.0–46.0)
Hemoglobin: 9.3 g/dL — ABNORMAL LOW (ref 12.0–15.0)
MCH: 26.5 pg (ref 26.0–34.0)
MCHC: 31.5 g/dL (ref 30.0–36.0)
MCV: 84 fL (ref 78.0–100.0)
PLATELETS: 116 10*3/uL — AB (ref 150–400)
RBC: 3.51 MIL/uL — AB (ref 3.87–5.11)
RDW: 15.4 % (ref 11.5–15.5)
WBC: 12.2 10*3/uL — ABNORMAL HIGH (ref 4.0–10.5)

## 2017-04-10 LAB — GLUCOSE, CAPILLARY
Glucose-Capillary: 102 mg/dL — ABNORMAL HIGH (ref 65–99)
Glucose-Capillary: 123 mg/dL — ABNORMAL HIGH (ref 65–99)
Glucose-Capillary: 183 mg/dL — ABNORMAL HIGH (ref 65–99)

## 2017-04-10 MED ORDER — MAGNESIUM CITRATE PO SOLN
1.0000 | Freq: Once | ORAL | Status: AC
  Administered 2017-04-10: 1 via ORAL
  Filled 2017-04-10: qty 296

## 2017-04-10 MED ORDER — SERTRALINE HCL 100 MG PO TABS: ORAL_TABLET | ORAL | 1 refills | Status: AC

## 2017-04-10 MED ORDER — ONDANSETRON 4 MG PO TBDP: 4.0000 mg | ORAL_TABLET | Freq: Three times a day (TID) | ORAL | 0 refills | Status: DC | PRN

## 2017-04-10 MED ORDER — OXYCODONE-ACETAMINOPHEN 5-325 MG PO TABS: 1.0000 | ORAL_TABLET | Freq: Four times a day (QID) | ORAL | 0 refills | Status: DC | PRN

## 2017-04-10 MED ORDER — SENNOSIDES-DOCUSATE SODIUM 8.6-50 MG PO TABS: 1.0000 | ORAL_TABLET | Freq: Two times a day (BID) | ORAL | 0 refills | Status: DC

## 2017-04-10 MED ORDER — ATORVASTATIN CALCIUM 20 MG PO TABS: 20.0000 mg | ORAL_TABLET | Freq: Every day | ORAL | 3 refills | Status: AC

## 2017-04-10 MED ORDER — CEPHALEXIN 500 MG PO CAPS: 500.0000 mg | ORAL_CAPSULE | Freq: Four times a day (QID) | ORAL | 0 refills | Status: DC

## 2017-04-10 MED ORDER — GABAPENTIN 300 MG PO CAPS: 300.0000 mg | ORAL_CAPSULE | Freq: Three times a day (TID) | ORAL | 1 refills | Status: AC

## 2017-04-10 MED ORDER — SENNOSIDES-DOCUSATE SODIUM 8.6-50 MG PO TABS
1.0000 | ORAL_TABLET | Freq: Two times a day (BID) | ORAL | Status: DC
  Administered 2017-04-10: 1 via ORAL
  Filled 2017-04-10: qty 1

## 2017-04-10 MED ORDER — POLYETHYLENE GLYCOL 3350 17 G PO PACK: 17.0000 g | PACK | Freq: Every day | ORAL | 0 refills | Status: DC | PRN

## 2017-04-10 NOTE — Progress Notes (Signed)
Occupational Therapy Treatment Patient Details Name: [name] [name] MRN: [id number] DOB: December 15, 1955 Today's Date: 04/10/2017    History of present illness 62 y.o. female with history of peripheral vascular disease, diabetes mellitus type 2, hyperlipidemia, tobacco abuse, thrombocytopenia, hypertension who was recently admitted and discharged on January 10 for right leg cellulitis and second toe gangrene was instructed to come to the ER after patient's blood cultures drawn during that stay grew Streptococcus variance in both bottles. s/p RIGHT FEMORAL-BELOW POPLITEAL ARTERY BYPASS WITH VEIN, RIGHT COMMON AND PRFUNDA FEMORAL ARTERY ENDARTERECTOMY WITH VEIN PATCH ANGIOPLASTY (Right Leg Lower) 04/07/17   OT comments  This 62 yo female admitted and underwent above presents to acute OT making progress with basic ADLs and mobility, but still could benefit from short stay at SNF to get safer and more independent with basic ADLs before going home with sister. We will continue to follow while an inpatient.  Follow Up Recommendations  Home health OT;Supervision/Assistance - 24 hour;Other (comment)(would benefit from SNF, but not open to going to one)    Equipment Recommendations  3 in 1 bedside commode;Tub/shower bench       Precautions / Restrictions Precautions Precautions: Fall Restrictions Weight Bearing Restrictions: No       Mobility Bed Mobility Overal bed mobility: Needs Assistance Bed Mobility: Supine to Sit;Sit to Supine     Supine to sit: Min guard;HOB elevated Sit to supine: Min assist;HOB elevated(for RLE)    Transfers Overall transfer level: Needs assistance Equipment used: Rolling walker (2 wheeled) Transfers: Sit to/from Stand Sit to Stand: Mod assist         General transfer comment: had to use a rocking motion for sit>stand    Balance Overall balance assessment: Needs assistance Sitting-balance support: Feet supported;No upper extremity supported Sitting  balance-Leahy Scale: Good     Standing balance support: Single extremity supported;During functional activity Standing balance-Leahy Scale: Poor Standing balance comment: standing to pull up underwear alternating hands                           ADL either performed or assessed with clinical judgement   ADL Overall ADL's : Needs assistance/impaired                     Lower Body Dressing: Moderate assistance Lower Body Dressing Details (indicate cue type and reason): min A sit<>stand with rocking motion from bed Toilet Transfer: Minimal assistance Toilet Transfer Details (indicate cue type and reason): Min A with rocking motion sit>stand from bed, RW           General ADL Comments: Pt reports her sister will and can A her prn once pt home at sister's house; pt plans to do some LBD supine in bed     Vision Patient Visual Report: No change from baseline            Cognition Arousal/Alertness: Awake/alert Behavior During Therapy: WFL for tasks assessed/performed Overall Cognitive Status: Within Functional Limits for tasks assessed                                                     Pertinent Vitals/ Pain       Pain Assessment: 0-10 Pain Score: 9  Pain Location:  R heel> R leg Pain Descriptors / Indicators:  Grimacing;Guarding;Throbbing;Constant Pain Intervention(s): Monitored during session;Repositioned         Frequency  Min 2X/week        Progress Toward Goals  OT Goals(current goals can now be found in the care plan section)  Progress towards OT goals: Progressing toward goals  Acute Rehab OT Goals Patient Stated Goal: feel better  Plan Discharge plan remains appropriate       AM-PAC PT "6 Clicks" Daily Activity     Outcome Measure   Help from another person eating meals?: None Help from another person taking care of personal grooming?: A Little Help from another person toileting, which includes using toliet,  bedpan, or urinal?: A Lot Help from another person bathing (including washing, rinsing, drying)?: A Lot Help from another person to put on and taking off regular upper body clothing?: A Little Help from another person to put on and taking off regular lower body clothing?: A Lot 6 Click Score: 16    End of Session Equipment Utilized During Treatment: Rolling walker  OT Visit Diagnosis: Other abnormalities of gait and mobility (R26.89);Pain Pain - Right/Left: Right Pain - part of body: Leg   Activity Tolerance Patient limited by pain   Patient Left with call bell/phone within reach;in chair;with family/visitor present;with bed alarm set   Nurse Communication (pt's incisions are weeping, will need bandage to cover before getting dressed)        Time: 6195-0932 OT Time Calculation (min): 21 min  Charges: OT Treatments $Self Care/Home Management : 8-22 mins  04/10/2017 [place], OTR/L [phone] 04/10/2017

## 2017-04-10 NOTE — Progress Notes (Signed)
  Progress Note    04/10/2017 7:15 AM 3 Days Post-Op  Subjective:  Pt admittedly has not been OOB much.  Still would like to be discharged home vs SNF   Vitals:   04/10/17 0334 04/10/17 0400  BP: (!) 112/59 (!) 141/63  Pulse:    Resp:  (!) 21  Temp: 98.4 F (36.9 C)   SpO2: 91% 91%   Physical Exam: Lungs:  Non labored Incisions:  R groin and medial leg incisions stable without firmness or drainage; R groin with wound vac in place; R AT/PT by doppler; 2nd toe dry gangrene stable Abdomen:  soft Neurologic: A&O  CBC    Component Value Date/Time   WBC 12.2 (H) 04/10/2017 0235   RBC 3.51 (L) 04/10/2017 0235   HGB 9.3 (L) 04/10/2017 0235   HCT 29.5 (L) 04/10/2017 0235   PLT 116 (L) 04/10/2017 0235   MCV 84.0 04/10/2017 0235   MCH 26.5 04/10/2017 0235   MCHC 31.5 04/10/2017 0235   RDW 15.4 04/10/2017 0235   LYMPHSABS 1.6 04/04/2017 1347   MONOABS 0.7 04/04/2017 1347   EOSABS 0.1 04/04/2017 1347   BASOSABS 0.0 04/04/2017 1347    BMET    Component Value Date/Time   NA 127 (L) 04/10/2017 0235   K 4.1 04/10/2017 0235   CL 94 (L) 04/10/2017 0235   CO2 24 04/10/2017 0235   GLUCOSE 155 (H) 04/10/2017 0235   BUN 13 04/10/2017 0235   CREATININE 0.60 04/10/2017 0235   CREATININE 0.69 01/28/2013 1110   CALCIUM 8.5 (L) 04/10/2017 0235   GFRNONAA >60 04/10/2017 0235   GFRAA >60 04/10/2017 0235    INR    Component Value Date/Time   INR 1.02 04/07/2017 1130     Intake/Output Summary (Last 24 hours) at 04/10/2017 0715 Last data filed at 04/10/2017 0425 Gross per 24 hour  Intake 530 ml  Output 850 ml  Net -320 ml     Assessment/Plan:  62 y.o. female is s/p  right fem-K pop BPG with vein and right femoral endarterectomy and VPA  3 Days Post-Op   Continue incisional vac R groin Brisk AT/PT by doppler; allow R 2nd toe time to demarcate PT re-consulted D/c home when activity improved   [name], PA-C [facility] [phone] 04/10/2017 7:15 AM

## 2017-04-10 NOTE — Progress Notes (Signed)
VASCULAR LAB PRELIMINARY  ARTERIAL  ABI completed:    RIGHT    LEFT    PRESSURE WAVEFORM  PRESSURE WAVEFORM  BRACHIAL 141 Tri BRACHIAL 140 Tri  DP 61 Mono DP 36 Mono  PT 56 Mono PT  absent  GREAT TOE absent NA GREAT TOE 27 NA    RIGHT LEFT  ABI 0.43 0.26   Right ABIs appear increased compared to prior study on 02/20/17. Left ABIs appear essentially unchanged compared to prior study on 02/20/17.   Final Interpretation: Right: Resting right ankle-brachial index indicates severe right lower extremity arterial disease. The right toe-brachial index is abnormal. Left: Resting left ankle-brachial index indicates critical left limb ischemia.    Everrett Coombe, RVT 04/10/2017, 12:08 PM

## 2017-04-10 NOTE — Progress Notes (Signed)
MD paged this am FYI about Heart rate 166, SVT during the night x 1. Was not substained. Patient NSR, ST this am 98-102 on rest.

## 2017-04-10 NOTE — Discharge Summary (Signed)
Physician Discharge Summary   Patient ID: MALAYJAH OTOOLE MRN: [id number] DOB/AGE: May 19, 1955 62 y.o.  Admit date: 04/04/2017 Discharge date: 04/10/2017  Primary Care Physician:  Jacelyn Pi, MD  Discharge Diagnoses:    . Bacteremia   Gangrenous 2nd right toe  . Thrombocytopenia ([place]) . PVD (peripheral vascular disease) ([place]) . Hypertension . DM (diabetes mellitus), type 2 with peripheral vascular complications (H[facility])   Consults: Vascular surgery, Dr. Trula Slade Infectious disease  Recommendations for Outpatient Follow-up:  1. Patient declined skilled nursing facility.  Home health PT, OT, home health aide, RN arranged by case management 2. Please repeat CBC/BMET at next visit    DIET: Heart healthy diet    Allergies:   Allergies  Allergen Reactions  . Iohexol Rash    Pt states she had a rash 35-40 yrs ago during a procedure.  Benadryl was given and pt was fine in 30 mins. She has had multiple CT's since with premeds and has done fine.  No anaphylaxis per pt. I updated this record. [name], RTRCT  03/06/17  . Morphine And Related Nausea And Vomiting    Makes her crawl out of her skin  . Penicillins Rash    Has patient had a PCN reaction causing immediate rash, facial/tongue/throat swelling, SOB or lightheadedness with hypotension: Yes Has patient had a PCN reaction causing severe rash involving mucus membranes or skin necrosis: No Has patient had a PCN reaction that required hospitalization: No Has patient had a PCN reaction occurring within the last 10 years: No Can take amoxicillin     . Sulfa Antibiotics Rash     DISCHARGE MEDICATIONS: Allergies as of 04/10/2017      Reactions   Iohexol Rash   Pt states she had a rash 35-40 yrs ago during a procedure.  Benadryl was given and pt was fine in 30 mins. She has had multiple CT's since with premeds and has done fine.  No anaphylaxis per pt. I updated this record. [name], RTRCT  03/06/17   Morphine And  Related Nausea And Vomiting   Makes her crawl out of her skin   Penicillins Rash   Has patient had a PCN reaction causing immediate rash, facial/tongue/throat swelling, SOB or lightheadedness with hypotension: Yes Has patient had a PCN reaction causing severe rash involving mucus membranes or skin necrosis: No Has patient had a PCN reaction that required hospitalization: No Has patient had a PCN reaction occurring within the last 10 years: No Can take amoxicillin    Sulfa Antibiotics Rash      Medication List    STOP taking these medications   HYDROcodone-acetaminophen 5-325 MG tablet Commonly known as:  NORCO/VICODIN     TAKE these medications   albuterol 108 (90 Base) MCG/ACT inhaler Commonly known as:  PROVENTIL HFA;VENTOLIN HFA Inhale 2 puffs into the lungs every 6 (six) hours as needed for wheezing.   aspirin 81 MG EC tablet Take 81 mg by mouth daily.   atorvastatin 20 MG tablet Commonly known as:  LIPITOR Take 1 tablet (20 mg total) by mouth at bedtime. What changed:  when to take this   cephALEXin 500 MG capsule Commonly known as:  KEFLEX Take 1 capsule (500 mg total) by mouth 4 (four) times daily. X 5 days What changed:  additional instructions   CINNAMON PO Take 1,000 mg by mouth 2 (two) times daily.   folic acid 419 MCG tablet Commonly known as:  FOLVITE Take 800 mcg by mouth daily.  gabapentin 300 MG capsule Commonly known as:  NEURONTIN Take 1 capsule (300 mg total) by mouth 3 (three) times daily.   hydrochlorothiazide 25 MG tablet Commonly known as:  HYDRODIURIL TAKE 1 TABLET DAILY   lisinopril 5 MG tablet Commonly known as:  PRINIVIL,ZESTRIL Take 5 mg by mouth daily.   metFORMIN 1000 MG tablet Commonly known as:  GLUCOPHAGE Take 1,000 mg by mouth 2 (two) times daily with a meal.   NOVOLOG MIX 70/30 FLEXPEN (70-30) 100 UNIT/ML FlexPen Generic drug:  insulin aspart protamine - aspart Inject 60 Units into the skin 2 (two) times daily.    ondansetron 4 MG disintegrating tablet Commonly known as:  ZOFRAN ODT Take 1 tablet (4 mg total) by mouth every 8 (eight) hours as needed for nausea or vomiting.   oxyCODONE-acetaminophen 5-325 MG tablet Commonly known as:  PERCOCET/ROXICET Take 1-2 tablets by mouth every 6 (six) hours as needed for moderate pain or severe pain.   polyethylene glycol packet Commonly known as:  MIRALAX / GLYCOLAX Take 17 g by mouth daily as needed for moderate constipation.   senna-docusate 8.6-50 MG tablet Commonly known as:  Senokot-S Take 1 tablet by mouth 2 (two) times daily. For constipation   sertraline 100 MG tablet Commonly known as:  ZOLOFT Take 100 mg by mouth once daily What changed:    how much to take  how to take this  when to take this  additional instructions   Vitamin D3 1000 units Caps Take 1,000 Units by mouth 2 (two) times daily.            Durable Medical Equipment  (From admission, onward)        Start     Ordered   04/10/17 1206  For home use only DME Walker rolling  Once    Comments:  Post op  Question:  Patient needs a walker to treat with the following condition  Answer:  Weakness   04/10/17 1205   04/10/17 0844  For home use only DME 3 n 1  Once     04/10/17 0843   04/10/17 0844  For home use only DME Tub bench  Once     04/10/17 0843       Brief H and P: For complete details please refer to admission H and P, but in brief *Jesica Goheen Butleris a 62 y.o.femalewithhistory of peripheral vascular disease, diabetes mellitus type 2, hyperlipidemia, tobacco abuse, thrombocytopenia, hypertension who was recently admitted and discharged on January 10 for right leg cellulitis and second toe gangrene was instructed to come to the ER after patient's blood cultures drawn during that stay grew Streptococcus variance in both bottles. Patient was discharged home on Keflex. Patient denied any fever chills after discharge. Vascular surgeon Dr. Trula Slade was  planning to have patient's carotid surgery this week.   Hospital Course:  Bacteremia in the setting of gangrenous second right toe and right foot cellulitis - X-ray negative for osteomyelitis - Blood cultures positive for Streptococcus meridians 2/2, 03/30/17, repeat blood cultures 1/15 negative so far - Continue IV Rocephin for now, appreciate ID recommendations - Patient had abdominal aortogram with lower extremity on 02/28/17 due to bilateral leg pain with ulcers, which had shown no significant aortoiliac occlusive disease, high-grade calcific stenotic lesions in the right common femoral artery with superficial femoral artery occlusion, occluded left superficial femoral artery with reconstitution of above-knee popliteal artery.  Was recommended bypass by vascular surgery. -CT right foot showed osteomyelitis, vascular surgery was  consulted due to severe PVD  - vascular surgery was consulted, patient underwent  right fem-k Pop bypass, postop day #3  - PT OT evaluation recommended skilled nursing facility however patient declined.  Home health PT OT, RN, home health aide was arranged by case management -She was cleared by vascular surgery for discharge -  ID was consulted for Streptococcus bacteremia which was seen on the blood cultures from previous admission.  Repeat blood cultures during this admission negative.  Patient continued to have IV Rocephin through the hospitalization, discharged on Keflex for 5 days     DM (diabetes mellitus), type 2 with peripheral vascular complications ([place]) -CBGs controlled, continue novoLog 70/30 at outpatient dose     Hypertension -BP stable today, continue outpatient regimen of antihypertensives lisinopril, HCTZ  Chronic thrombocytopenia (H[facility]) -PLT count stable, monitor closely    PVD (peripheral vascular disease) ([place]), critical carotid stenosis -Vascular surgery following, postponed carotid surgery due to #1.      Day of Discharge BP (!)  130/58 (BP Location: Right Arm)   Pulse 78   Temp 98.4 F (36.9 C) (Oral)   Resp 19   Ht 5\' 7"  (1.702 m)   Wt 116.8 kg (257 lb 8 oz)   SpO2 95%   BMI 40.33 kg/m   Physical Exam: General: Alert and awake oriented x3 not in any acute distress. HEENT: anicteric sclera, pupils reactive to light and accommodation CVS: S1-S2 clear no murmur rubs or gallops Chest: clear to auscultation bilaterally, no wheezing rales or rhonchi Abdomen: soft nontender, nondistended, normal bowel sounds Extremities: Right foot cellulitis improving  Neuro: Cranial nerves II-XII intact, no focal neurological deficits   The results of significant diagnostics from this hospitalization (including imaging, microbiology, ancillary and laboratory) are listed below for reference.    LAB RESULTS: Basic Metabolic Panel: Recent Labs  Lab 04/08/17 0322 04/10/17 0235  NA 133* 127*  K 4.1 4.1  CL 97* 94*  CO2 26 24  GLUCOSE 169* 155*  BUN 10 13  CREATININE 0.54 0.60  CALCIUM 8.6* 8.5*   Liver Function Tests: Recent Labs  Lab 04/04/17 1347 04/07/17 1130  AST 23 33  ALT 20 27  ALKPHOS 105 69  BILITOT 0.6 0.6  PROT 6.3* 6.9  ALBUMIN 3.6 3.8   No results for input(s): LIPASE, AMYLASE in the last 168 hours. No results for input(s): AMMONIA in the last 168 hours. CBC: Recent Labs  Lab 04/04/17 1347  04/08/17 0322 04/10/17 0235  WBC 8.8   < > 11.1* 12.2*  NEUTROABS 6.2  --   --   --   HGB 14.5   < > 11.6* 9.3*  HCT 43.1   < > 35.9* 29.5*  MCV 83.0   < > 83.9 84.0  PLT 147*   < > 121* 116*   < > = values in this interval not displayed.   Cardiac Enzymes: No results for input(s): CKTOTAL, CKMB, CKMBINDEX, TROPONINI in the last 168 hours. BNP: Invalid input(s): POCBNP CBG: Recent Labs  Lab 04/10/17 0614 04/10/17 1141  GLUCAP 123* 183*    Significant Diagnostic Studies:  Dg Foot Complete Right  Result Date: 04/04/2017 CLINICAL DATA:  Diabetic foot ulcer for several weeks. Right foot  pain. EXAM: RIGHT FOOT COMPLETE - 3+ VIEW COMPARISON:  None. FINDINGS: Osseous alignment is normal. Bone mineralization is normal. No fracture line or displaced fracture fragment identified. No acute or suspicious osseous lesion. No destructive change to suggest osteomyelitis. Soft tissues about the right foot  are unremarkable. IMPRESSION: Negative. Electronically Signed   By: Franki Cabot M.D.   On: 04/04/2017 20:48    2D ECHO:   Disposition and Follow-up: Discharge Instructions    Diet Carb Modified   Complete by:  As directed    Increase activity slowly   Complete by:  As directed        DISPOSITION: Home with home health   [place]    Jacelyn Pi, MD. Schedule an appointment as soon as possible for a visit in 2 week(s).   Specialty:  Endocrinology Contact information: [address] [place] [address] [phone]        Serafina Mitchell, MD. Schedule an appointment as soon as possible for a visit in 2 week(s).   Specialties:  Vascular Surgery, Cardiology Contact information: [place] [address] [place] Follow up.   Specialty:  [place] Why:  HHRN/PT arranged- they will call you to set up home visits Contact information: [address] [place] [address] [phone]        [facility] - Dme Follow up.   Why:  3n1 and rolling walker arranged- to be delivered to room prior to discharge. Contact information: [address] [place] [address] [phone]            Time spent on Discharge: 40 minutes  Signed:   Estill Cotta M.D. [facility] 04/10/2017, 1:44 PM Pager: [phone]

## 2017-04-10 NOTE — Care Management Note (Signed)
Case Management Note Marvetta Gibbons RN, BSN Unit 4E-Case Manager [phone]  Patient Details  Name: [name] [name] MRN: [id number] Date of Birth: 04-29-1955  Subjective/Objective:   Pt admitted with bacteremia,  Ischemic foot, s/p right fempop BPG with right fem. Endarterectomy and right groin prevena wound VAC placed.                 Action/Plan: PTA Pt lived at home alone, plans to go to sisters home at discharge- sister's name is Sindy Messing- address is  [place] [place] [address]-- pt's cell # [phone] Pt does not want to go to SNF and wants to be discharged home with [facility]- orders placed for HHRN/PT/OT/aide- spoke with pt at bedside- choice offered for [facility] services- per pt she does not have a preference as long as insurance is in network-also discussed DME needs which are 3n1 and RW- pt does not need tub bench.  reached out to [facility] since pt is vascular- however they do not contract with [employer]- call made to [facility] with [facility] - who can accept referral- notified of DME needs folr 3n1 and RW- which will be delivered to room prior to pt leaving- also spoke with bedside RN- Vincente Liberty- who will call OR for a Prevena pump for discharge to home and change pt over to prior to d/c. - confirmed pt's sisters address for discharge.   Expected Discharge Date:  04/10/17               Expected Discharge Plan:  [place]  In-House Referral:  Clinical Social Work  Discharge planning Services  CM Consult  Post Acute Care Choice:  Durable Medical Equipment, Home Health Choice offered to:     DME Arranged:  3-N-1, Tub bench DME Agency:  [place]:  RN, PT, OT, Nurse's Aide [place] Agency:     Status of Service:  Completed, signed off  If discussed at [place] of Stay Meetings, dates discussed:    Discharge Disposition: home/home health   Additional Comments:  Dawayne Patricia, RN 04/10/2017, 10:59 AM

## 2017-04-10 NOTE — Progress Notes (Signed)
Per RNCM note patient does not want SNF and is refusing placement. Patient wants to discharge home to sister with home health to follow. RNCM on floor is following patient for home health needs. CSW signing off    Rhea Pink, MSW,  [place] [phone]

## 2017-04-10 NOTE — Progress Notes (Signed)
[name] [name] to be D/C'd Home per MD order. Discussed with the patient and all questions fully answered.    VVS, Skin clean, dry and intact without evidence of skin break down, no evidence of skin tears noted.  IV catheter discontinued intact. Site without signs and symptoms of complications. Dressing and pressure applied.  An After Visit Summary was printed and given to the patient.  Patient escorted via [place], and D/C home via private auto.  [name]  04/10/2017 3:46 PM

## 2017-04-10 NOTE — Progress Notes (Signed)
Physical Therapy Treatment Patient Details Name: [name] [name] MRN: [id number] DOB: 31-Dec-1955 Today's Date: 04/10/2017    History of Present Illness 62 y.o. female with history of peripheral vascular disease, diabetes mellitus type 2, hyperlipidemia, tobacco abuse, thrombocytopenia, hypertension who was recently admitted and discharged on January 10 for right leg cellulitis and second toe gangrene was instructed to come to the ER after patient's blood cultures drawn during that stay grew Streptococcus variance in both bottles. s/p RIGHT FEMORAL-BELOW POPLITEAL ARTERY BYPASS WITH VEIN, RIGHT COMMON AND PRFUNDA FEMORAL ARTERY ENDARTERECTOMY WITH VEIN PATCH ANGIOPLASTY (Right Leg Lower) 04/07/17    PT Comments    Pt performed short bouts of gait training requiring min to mod assistance.  Pt is not safe to d/c home in her current functional state and will benefit from skilled rehab in a post acute setting.  Will inform supervising PT of change in recommendations and need for further equipment if she does d/c home.  Pt will also benefit from a wound care consult to L heel before d/c.  Informed nursing of need for change in recommendations and need for wound care consult.  Pt desaturated with activity.  SATURATION QUALIFICATIONS: (This note is used to comply with regulatory documentation for home oxygen)  Patient Saturations on Room Air at Rest = 89%  Patient Saturations on Room Air while Ambulating = 84%  Patient Saturations on 4 Liters of oxygen while Ambulating = 92%  Please briefly explain why patient needs home oxygen: desaturation with activity.    Follow Up Recommendations  Supervision/Assistance - 24 hour;SNF     Equipment Recommendations  Rolling walker with 5" wheels;3in1 (PT);Wheelchair cushion (measurements PT);Wheelchair (measurements PT)(Pt would benefit from skilled rehab in a post acute setting before returning home, but if she refuses or does not qualify she will require the  following at home.  )    Recommendations for Other Services       Precautions / Restrictions Precautions Precautions: None Restrictions Weight Bearing Restrictions: No    Mobility  Bed Mobility Overal bed mobility: Needs Assistance Bed Mobility: Supine to Sit     Supine to sit: Mod assist     General bed mobility comments: Mod assist to elevate trunk into sitting edge of bed, once in sitting able to maintain balance and scoot forward.    Transfers Overall transfer level: Needs assistance Equipment used: Rolling walker (2 wheeled) Transfers: Sit to/from Stand              Ambulation/Gait Ambulation/Gait assistance: Min assist;Mod assist(min assistance from bed and recliner chair and mod assistance from commode.  ) Ambulation Distance (Feet): 15 Feet(x2 trials, 3rd trial of 6 ft from chair back to bed due to vascular test and transport arriving.  ) Assistive device: Rolling walker (2 wheeled) Gait Pattern/deviations: Decreased step length - right;Decreased step length - left;Step-to pattern;Decreased weight shift to right;Decreased stance time - right;Shuffle Gait velocity: slowed Gait velocity interpretation: Below normal speed for age/gender General Gait Details: Pt required min assistance and at time moderate assistance for turns and backing.  Pt with LOB backing and poor safety awareness.  Cues for step pattern to reduce pain.     Stairs Stairs: (Pt reports she will be staying with her sister who has a ramp.  )          Wheelchair Mobility    Modified Rankin (Stroke Patients Only)       Balance     Sitting balance-Leahy Scale: Fair  Standing balance-Leahy Scale: Poor                              Cognition Arousal/Alertness: Awake/alert Behavior During Therapy: WFL for tasks assessed/performed Overall Cognitive Status: Within Functional Limits for tasks assessed                                         Exercises      General Comments        Pertinent Vitals/Pain Pain Assessment: 0-10 Pain Score: 9  Pain Location:  R heel> R leg Pain Descriptors / Indicators: Grimacing;Guarding;Throbbing;Operative site guarding;Aching;Constant Pain Intervention(s): Monitored during session;Repositioned(reapplied heel bandage to add more cushion.  )    Home Living                      Prior Function            PT Goals (current goals can now be found in the care plan section) Acute Rehab PT Goals Patient Stated Goal: feel better Potential to Achieve Goals: Fair Progress towards PT goals: Not progressing toward goals - comment(Pt limited due to pain, weakness and general deconditioning.  )    Frequency    Min 3X/week      PT Plan Discharge plan needs to be updated    Co-evaluation              AM-PAC PT "6 Clicks" Daily Activity  Outcome Measure  Difficulty turning over in bed (including adjusting bedclothes, sheets and blankets)?: A Lot Difficulty moving from lying on back to sitting on the side of the bed? : Unable Difficulty sitting down on and standing up from a chair with arms (e.g., wheelchair, bedside commode, etc,.)?: Unable Help needed moving to and from a bed to chair (including a wheelchair)?: A Lot Help needed walking in hospital room?: A Lot Help needed climbing 3-5 steps with a railing? : Total 6 Click Score: 9    End of Session Equipment Utilized During Treatment: Gait belt;Oxygen Activity Tolerance: Patient limited by pain Patient left: in chair;with call bell/phone within reach;with chair alarm set;with family/visitor present Nurse Communication: Mobility status(need for wound care consult to L heel and desaturation on RA with activity.  ) PT Visit Diagnosis: Unsteadiness on feet (R26.81);Other abnormalities of gait and mobility (R26.89);Muscle weakness (generalized) (M62.81);Difficulty in walking, not elsewhere classified (R26.2);Other (comment)      Time: 1000-1046 PT Time Calculation (min) (ACUTE ONLY): 46 min  Charges:  $Gait Training: 8-22 mins $Therapeutic Activity: 23-37 mins                    G CodesGovernor Rooks, PTA pager [phone]    Cristela Blue 04/10/2017, 12:00 PM

## 2017-04-11 ENCOUNTER — Telehealth: Payer: Self-pay | Admitting: Surgery

## 2017-04-11 NOTE — Telephone Encounter (Signed)
-----   Message from Mena Goes, RN sent at 04/11/2017  9:07 AM EST ----- Regarding: 2-3 weeks postop fem-pop   ----- Message ----- From: Iline Oven Sent: 04/11/2017   7:06 AM To: [facility] Charge Pool  Can you schedule an appt with Dr. Trula Slade in about 2-3 weeks.  PO R fem-pop. Thanks, MAtt

## 2017-04-11 NOTE — Telephone Encounter (Signed)
Sched appt 05/08/17 at 8:45. Lm on hm# to inform pt of appt.

## 2017-04-14 DIAGNOSIS — B950 Streptococcus, group A, as the cause of diseases classified elsewhere: Secondary | ICD-10-CM | POA: Diagnosis not present

## 2017-04-14 DIAGNOSIS — I6529 Occlusion and stenosis of unspecified carotid artery: Secondary | ICD-10-CM | POA: Diagnosis not present

## 2017-04-14 DIAGNOSIS — D696 Thrombocytopenia, unspecified: Secondary | ICD-10-CM | POA: Diagnosis not present

## 2017-04-14 DIAGNOSIS — E785 Hyperlipidemia, unspecified: Secondary | ICD-10-CM | POA: Diagnosis not present

## 2017-04-14 DIAGNOSIS — Z794 Long term (current) use of insulin: Secondary | ICD-10-CM | POA: Diagnosis not present

## 2017-04-14 DIAGNOSIS — Z720 Tobacco use: Secondary | ICD-10-CM | POA: Diagnosis not present

## 2017-04-14 DIAGNOSIS — R7881 Bacteremia: Secondary | ICD-10-CM | POA: Diagnosis not present

## 2017-04-14 DIAGNOSIS — I10 Essential (primary) hypertension: Secondary | ICD-10-CM | POA: Diagnosis not present

## 2017-04-14 DIAGNOSIS — E1152 Type 2 diabetes mellitus with diabetic peripheral angiopathy with gangrene: Secondary | ICD-10-CM | POA: Diagnosis not present

## 2017-04-14 DIAGNOSIS — L03115 Cellulitis of right lower limb: Secondary | ICD-10-CM | POA: Diagnosis not present

## 2017-04-17 DIAGNOSIS — R7881 Bacteremia: Secondary | ICD-10-CM | POA: Diagnosis not present

## 2017-04-17 DIAGNOSIS — B950 Streptococcus, group A, as the cause of diseases classified elsewhere: Secondary | ICD-10-CM | POA: Diagnosis not present

## 2017-04-17 DIAGNOSIS — I10 Essential (primary) hypertension: Secondary | ICD-10-CM | POA: Diagnosis not present

## 2017-04-17 DIAGNOSIS — E785 Hyperlipidemia, unspecified: Secondary | ICD-10-CM | POA: Diagnosis not present

## 2017-04-17 DIAGNOSIS — L03115 Cellulitis of right lower limb: Secondary | ICD-10-CM | POA: Diagnosis not present

## 2017-04-17 DIAGNOSIS — E1152 Type 2 diabetes mellitus with diabetic peripheral angiopathy with gangrene: Secondary | ICD-10-CM | POA: Diagnosis not present

## 2017-04-17 DIAGNOSIS — Z794 Long term (current) use of insulin: Secondary | ICD-10-CM | POA: Diagnosis not present

## 2017-04-17 DIAGNOSIS — D696 Thrombocytopenia, unspecified: Secondary | ICD-10-CM | POA: Diagnosis not present

## 2017-04-17 DIAGNOSIS — I6529 Occlusion and stenosis of unspecified carotid artery: Secondary | ICD-10-CM | POA: Diagnosis not present

## 2017-04-17 DIAGNOSIS — Z720 Tobacco use: Secondary | ICD-10-CM | POA: Diagnosis not present

## 2017-04-19 DIAGNOSIS — I10 Essential (primary) hypertension: Secondary | ICD-10-CM | POA: Diagnosis not present

## 2017-04-19 DIAGNOSIS — B950 Streptococcus, group A, as the cause of diseases classified elsewhere: Secondary | ICD-10-CM | POA: Diagnosis not present

## 2017-04-19 DIAGNOSIS — I6529 Occlusion and stenosis of unspecified carotid artery: Secondary | ICD-10-CM | POA: Diagnosis not present

## 2017-04-19 DIAGNOSIS — Z794 Long term (current) use of insulin: Secondary | ICD-10-CM | POA: Diagnosis not present

## 2017-04-19 DIAGNOSIS — D696 Thrombocytopenia, unspecified: Secondary | ICD-10-CM | POA: Diagnosis not present

## 2017-04-19 DIAGNOSIS — E1152 Type 2 diabetes mellitus with diabetic peripheral angiopathy with gangrene: Secondary | ICD-10-CM | POA: Diagnosis not present

## 2017-04-19 DIAGNOSIS — E785 Hyperlipidemia, unspecified: Secondary | ICD-10-CM | POA: Diagnosis not present

## 2017-04-19 DIAGNOSIS — L03115 Cellulitis of right lower limb: Secondary | ICD-10-CM | POA: Diagnosis not present

## 2017-04-19 DIAGNOSIS — R7881 Bacteremia: Secondary | ICD-10-CM | POA: Diagnosis not present

## 2017-04-19 DIAGNOSIS — Z720 Tobacco use: Secondary | ICD-10-CM | POA: Diagnosis not present

## 2017-04-20 DIAGNOSIS — B950 Streptococcus, group A, as the cause of diseases classified elsewhere: Secondary | ICD-10-CM | POA: Diagnosis not present

## 2017-04-20 DIAGNOSIS — D696 Thrombocytopenia, unspecified: Secondary | ICD-10-CM | POA: Diagnosis not present

## 2017-04-20 DIAGNOSIS — Z794 Long term (current) use of insulin: Secondary | ICD-10-CM | POA: Diagnosis not present

## 2017-04-20 DIAGNOSIS — E785 Hyperlipidemia, unspecified: Secondary | ICD-10-CM | POA: Diagnosis not present

## 2017-04-20 DIAGNOSIS — L03115 Cellulitis of right lower limb: Secondary | ICD-10-CM | POA: Diagnosis not present

## 2017-04-20 DIAGNOSIS — R7881 Bacteremia: Secondary | ICD-10-CM | POA: Diagnosis not present

## 2017-04-20 DIAGNOSIS — I10 Essential (primary) hypertension: Secondary | ICD-10-CM | POA: Diagnosis not present

## 2017-04-20 DIAGNOSIS — I6529 Occlusion and stenosis of unspecified carotid artery: Secondary | ICD-10-CM | POA: Diagnosis not present

## 2017-04-20 DIAGNOSIS — E1152 Type 2 diabetes mellitus with diabetic peripheral angiopathy with gangrene: Secondary | ICD-10-CM | POA: Diagnosis not present

## 2017-04-20 DIAGNOSIS — Z720 Tobacco use: Secondary | ICD-10-CM | POA: Diagnosis not present

## 2017-04-21 DIAGNOSIS — I6529 Occlusion and stenosis of unspecified carotid artery: Secondary | ICD-10-CM | POA: Diagnosis not present

## 2017-04-21 DIAGNOSIS — L03115 Cellulitis of right lower limb: Secondary | ICD-10-CM | POA: Diagnosis not present

## 2017-04-21 DIAGNOSIS — I10 Essential (primary) hypertension: Secondary | ICD-10-CM | POA: Diagnosis not present

## 2017-04-21 DIAGNOSIS — D696 Thrombocytopenia, unspecified: Secondary | ICD-10-CM | POA: Diagnosis not present

## 2017-04-21 DIAGNOSIS — Z794 Long term (current) use of insulin: Secondary | ICD-10-CM | POA: Diagnosis not present

## 2017-04-21 DIAGNOSIS — E785 Hyperlipidemia, unspecified: Secondary | ICD-10-CM | POA: Diagnosis not present

## 2017-04-21 DIAGNOSIS — E1152 Type 2 diabetes mellitus with diabetic peripheral angiopathy with gangrene: Secondary | ICD-10-CM | POA: Diagnosis not present

## 2017-04-21 DIAGNOSIS — B950 Streptococcus, group A, as the cause of diseases classified elsewhere: Secondary | ICD-10-CM | POA: Diagnosis not present

## 2017-04-21 DIAGNOSIS — R7881 Bacteremia: Secondary | ICD-10-CM | POA: Diagnosis not present

## 2017-04-21 DIAGNOSIS — Z720 Tobacco use: Secondary | ICD-10-CM | POA: Diagnosis not present

## 2017-04-26 DIAGNOSIS — E7800 Pure hypercholesterolemia, unspecified: Secondary | ICD-10-CM | POA: Diagnosis not present

## 2017-04-26 DIAGNOSIS — I10 Essential (primary) hypertension: Secondary | ICD-10-CM | POA: Diagnosis not present

## 2017-04-26 DIAGNOSIS — E1165 Type 2 diabetes mellitus with hyperglycemia: Secondary | ICD-10-CM | POA: Diagnosis not present

## 2017-04-26 DIAGNOSIS — E1140 Type 2 diabetes mellitus with diabetic neuropathy, unspecified: Secondary | ICD-10-CM | POA: Diagnosis not present

## 2017-05-01 DIAGNOSIS — L97519 Non-pressure chronic ulcer of other part of right foot with unspecified severity: Secondary | ICD-10-CM | POA: Diagnosis not present

## 2017-05-01 DIAGNOSIS — L97512 Non-pressure chronic ulcer of other part of right foot with fat layer exposed: Secondary | ICD-10-CM | POA: Diagnosis not present

## 2017-05-01 DIAGNOSIS — L97119 Non-pressure chronic ulcer of right thigh with unspecified severity: Secondary | ICD-10-CM | POA: Diagnosis not present

## 2017-05-01 DIAGNOSIS — L97419 Non-pressure chronic ulcer of right heel and midfoot with unspecified severity: Secondary | ICD-10-CM | POA: Diagnosis not present

## 2017-05-01 DIAGNOSIS — I70235 Atherosclerosis of native arteries of right leg with ulceration of other part of foot: Secondary | ICD-10-CM | POA: Diagnosis not present

## 2017-05-01 DIAGNOSIS — L89610 Pressure ulcer of right heel, unstageable: Secondary | ICD-10-CM | POA: Diagnosis not present

## 2017-05-01 DIAGNOSIS — L97112 Non-pressure chronic ulcer of right thigh with fat layer exposed: Secondary | ICD-10-CM | POA: Diagnosis not present

## 2017-05-01 DIAGNOSIS — L97819 Non-pressure chronic ulcer of other part of right lower leg with unspecified severity: Secondary | ICD-10-CM | POA: Diagnosis not present

## 2017-05-01 DIAGNOSIS — L97829 Non-pressure chronic ulcer of other part of left lower leg with unspecified severity: Secondary | ICD-10-CM | POA: Diagnosis not present

## 2017-05-08 ENCOUNTER — Ambulatory Visit (INDEPENDENT_AMBULATORY_CARE_PROVIDER_SITE_OTHER): Payer: BLUE CROSS/BLUE SHIELD | Admitting: Surgery

## 2017-05-08 ENCOUNTER — Other Ambulatory Visit: Payer: Self-pay

## 2017-05-08 ENCOUNTER — Encounter: Payer: Self-pay | Admitting: Surgery

## 2017-05-08 VITALS — BP 128/79 | HR 101 | Temp 97.2°F | Resp 20 | Ht 67.0 in | Wt 247.0 lb

## 2017-05-08 DIAGNOSIS — L97829 Non-pressure chronic ulcer of other part of left lower leg with unspecified severity: Secondary | ICD-10-CM | POA: Diagnosis not present

## 2017-05-08 DIAGNOSIS — L97412 Non-pressure chronic ulcer of right heel and midfoot with fat layer exposed: Secondary | ICD-10-CM | POA: Diagnosis not present

## 2017-05-08 DIAGNOSIS — I70235 Atherosclerosis of native arteries of right leg with ulceration of other part of foot: Secondary | ICD-10-CM | POA: Diagnosis not present

## 2017-05-08 DIAGNOSIS — I7025 Atherosclerosis of native arteries of other extremities with ulceration: Secondary | ICD-10-CM

## 2017-05-08 DIAGNOSIS — L89610 Pressure ulcer of right heel, unstageable: Secondary | ICD-10-CM | POA: Diagnosis not present

## 2017-05-08 DIAGNOSIS — L97112 Non-pressure chronic ulcer of right thigh with fat layer exposed: Secondary | ICD-10-CM | POA: Diagnosis not present

## 2017-05-08 DIAGNOSIS — L97519 Non-pressure chronic ulcer of other part of right foot with unspecified severity: Secondary | ICD-10-CM | POA: Diagnosis not present

## 2017-05-08 DIAGNOSIS — L97512 Non-pressure chronic ulcer of other part of right foot with fat layer exposed: Secondary | ICD-10-CM | POA: Diagnosis not present

## 2017-05-08 DIAGNOSIS — I70234 Atherosclerosis of native arteries of right leg with ulceration of heel and midfoot: Secondary | ICD-10-CM | POA: Diagnosis not present

## 2017-05-08 NOTE — Progress Notes (Signed)
Patient name: [name] [name] MRN: [id number] DOB: 16-Nov-1955 Sex: female  REASON FOR VISIT:    Postop  HISTORY OF PRESENT ILLNESS:   [name] [name] is a 62 y.o. female who has been experiencing bilateral leg pain for several months.  She has trouble walking more than 200 feet.  Her right leg bothers her more than the left.  She underwent angiography which revealed high-grade stenotic lesion within the common femoral artery and superficial femoral artery occlusion.  Her preoperative evaluation revealed a high-grade right carotid stenosis and possible left carotid occlusion.  She underwent a CT angiogram that showed a string sign in her left carotid artery.  The stenosis in the right was about 75%.  She presented to the hospital with staph infection of her right leg with gangrenous changes to her toe.  Because of this I elected to proceed with right leg revascularization prior to carotid surgery.   On 04/07/2017 she underwent a right common femoral to below-knee popliteal artery bypass graft with ipsilateral translocated non-reversed saphenous vein as well as right iliofemoral endarterectomy with vein patch angioplasty.  Her postoperative course was uncomplicated and she is here today for follow-up.  CURRENT MEDICATIONS:    Current Outpatient Medications  Medication Sig Dispense Refill  . albuterol (PROVENTIL HFA;VENTOLIN HFA) 108 (90 BASE) MCG/ACT inhaler Inhale 2 puffs into the lungs every 6 (six) hours as needed for wheezing.    Marland Kitchen aspirin 81 MG EC tablet Take 81 mg by mouth daily.      Marland Kitchen atorvastatin (LIPITOR) 20 MG tablet Take 1 tablet (20 mg total) by mouth at bedtime. 90 tablet 3  . Cholecalciferol (VITAMIN D3) 1000 UNITS CAPS Take 1,000 Units by mouth 2 (two) times daily.     . folic acid (FOLVITE) 732 MCG tablet Take 800 mcg by mouth daily.     Marland Kitchen gabapentin (NEURONTIN) 300 MG capsule Take 1 capsule (300 mg total) by mouth 3 (three) times daily. 90  capsule 1  . hydrochlorothiazide (HYDRODIURIL) 25 MG tablet TAKE 1 TABLET DAILY 30 tablet 2  . insulin aspart protamine - aspart (NOVOLOG MIX 70/30 FLEXPEN) (70-30) 100 UNIT/ML FlexPen Inject 60 Units into the skin 2 (two) times daily.    Marland Kitchen lisinopril (PRINIVIL,ZESTRIL) 5 MG tablet Take 5 mg by mouth daily.    Marland Kitchen senna-docusate (SENOKOT-S) 8.6-50 MG tablet Take 1 tablet by mouth 2 (two) times daily. For constipation 60 tablet 0  . sertraline (ZOLOFT) 100 MG tablet Take 100 mg by mouth once daily 180 tablet 1  . cephALEXin (KEFLEX) 500 MG capsule Take 1 capsule (500 mg total) by mouth 4 (four) times daily. X 5 days (Patient not taking: Reported on 05/08/2017) 20 capsule 0  . CINNAMON PO Take 1,000 mg by mouth 2 (two) times daily.    . metFORMIN (GLUCOPHAGE) 1000 MG tablet Take 1,000 mg by mouth 2 (two) times daily with a meal.    . ondansetron (ZOFRAN ODT) 4 MG disintegrating tablet Take 1 tablet (4 mg total) by mouth every 8 (eight) hours as needed for nausea or vomiting. (Patient not taking: Reported on 05/08/2017) 20 tablet 0  . oxyCODONE-acetaminophen (PERCOCET/ROXICET) 5-325 MG tablet Take 1-2 tablets by mouth every 6 (six) hours as needed for moderate pain or severe pain. (Patient not taking: Reported on 05/08/2017) 30 tablet 0  . polyethylene glycol (MIRALAX / GLYCOLAX) packet Take 17 g by mouth daily as needed for moderate constipation. (Patient not taking: Reported on 05/08/2017) 30 each 0  No current facility-administered medications for this visit.     REVIEW OF SYSTEMS:   [X]  denotes positive finding, [ ]  denotes negative finding Cardiac  Comments:  Chest pain or chest pressure:    Shortness of breath upon exertion:    Short of breath when lying flat:    Irregular heart rhythm:    Constitutional    Fever or chills:      PHYSICAL EXAM:   Vitals:   05/08/17 0844 05/08/17 0848  BP: (!) 147/80 128/79  Pulse: (!) 101 (!) 101  Resp: 20   Temp: (!) 97.2 F (36.2 C)   TempSrc:  Oral   SpO2: 96%   Weight: 247 lb (112 kg)   Height: 5\' 7"  (1.702 m)     GENERAL: The patient is a well-nourished female, in no acute distress. The vital signs are documented above. CARDIOVASCULAR: There is a regular rate and rhythm. PULMONARY: Non-labored respirations Brisk Doppler signals in the right foot.  Superficial wound in the right groin which is healing with dressing changes.  The right foot is dressed.  STUDIES:   None   MEDICAL ISSUES:   Right foot wound: Continue with treatment the wound center.  Status post right femoral-popliteal bypass graft: The patient will need a duplex of her graft in 3 months.  This has not yet been ordered but I will order it when she comes back in 1 month  Carotid stenosis: There was concern of carotid occlusion on the left by ultrasound however this was found to be patent on CT scan.  She has not yet recovered enough from her bypass graft to consider proceeding with carotid endarterectomy.  I am going to see her back in 1 month for follow-up.  I will most likely need to repeat her CT angiogram given the time interval between the last 1 to make sure that the left carotid artery remains patent.  She will then need to undergo carotid endarterectomy based on those results.  [name], MD [facility] [phone] Pager [phone]

## 2017-05-15 DIAGNOSIS — L97519 Non-pressure chronic ulcer of other part of right foot with unspecified severity: Secondary | ICD-10-CM | POA: Diagnosis not present

## 2017-05-15 DIAGNOSIS — L97112 Non-pressure chronic ulcer of right thigh with fat layer exposed: Secondary | ICD-10-CM | POA: Diagnosis not present

## 2017-05-15 DIAGNOSIS — L97512 Non-pressure chronic ulcer of other part of right foot with fat layer exposed: Secondary | ICD-10-CM | POA: Diagnosis not present

## 2017-05-15 DIAGNOSIS — I70235 Atherosclerosis of native arteries of right leg with ulceration of other part of foot: Secondary | ICD-10-CM | POA: Diagnosis not present

## 2017-05-15 DIAGNOSIS — L89610 Pressure ulcer of right heel, unstageable: Secondary | ICD-10-CM | POA: Diagnosis not present

## 2017-05-22 DIAGNOSIS — L97519 Non-pressure chronic ulcer of other part of right foot with unspecified severity: Secondary | ICD-10-CM | POA: Diagnosis not present

## 2017-05-22 DIAGNOSIS — L97412 Non-pressure chronic ulcer of right heel and midfoot with fat layer exposed: Secondary | ICD-10-CM | POA: Diagnosis not present

## 2017-05-22 DIAGNOSIS — I70235 Atherosclerosis of native arteries of right leg with ulceration of other part of foot: Secondary | ICD-10-CM | POA: Diagnosis not present

## 2017-05-22 DIAGNOSIS — L97112 Non-pressure chronic ulcer of right thigh with fat layer exposed: Secondary | ICD-10-CM | POA: Diagnosis not present

## 2017-05-22 DIAGNOSIS — L97829 Non-pressure chronic ulcer of other part of left lower leg with unspecified severity: Secondary | ICD-10-CM | POA: Diagnosis not present

## 2017-05-22 DIAGNOSIS — L89610 Pressure ulcer of right heel, unstageable: Secondary | ICD-10-CM | POA: Diagnosis not present

## 2017-05-22 DIAGNOSIS — L97512 Non-pressure chronic ulcer of other part of right foot with fat layer exposed: Secondary | ICD-10-CM | POA: Diagnosis not present

## 2017-05-29 DIAGNOSIS — L97112 Non-pressure chronic ulcer of right thigh with fat layer exposed: Secondary | ICD-10-CM | POA: Diagnosis not present

## 2017-05-29 DIAGNOSIS — Y839 Surgical procedure, unspecified as the cause of abnormal reaction of the patient, or of later complication, without mention of misadventure at the time of the procedure: Secondary | ICD-10-CM | POA: Diagnosis not present

## 2017-05-29 DIAGNOSIS — L97412 Non-pressure chronic ulcer of right heel and midfoot with fat layer exposed: Secondary | ICD-10-CM | POA: Diagnosis not present

## 2017-05-29 DIAGNOSIS — T8130XD Disruption of wound, unspecified, subsequent encounter: Secondary | ICD-10-CM | POA: Diagnosis not present

## 2017-05-29 DIAGNOSIS — I70235 Atherosclerosis of native arteries of right leg with ulceration of other part of foot: Secondary | ICD-10-CM | POA: Diagnosis not present

## 2017-05-29 DIAGNOSIS — L97512 Non-pressure chronic ulcer of other part of right foot with fat layer exposed: Secondary | ICD-10-CM | POA: Diagnosis not present

## 2017-05-29 DIAGNOSIS — L89610 Pressure ulcer of right heel, unstageable: Secondary | ICD-10-CM | POA: Diagnosis not present

## 2017-05-29 DIAGNOSIS — L97819 Non-pressure chronic ulcer of other part of right lower leg with unspecified severity: Secondary | ICD-10-CM | POA: Diagnosis not present

## 2017-06-01 DIAGNOSIS — I10 Essential (primary) hypertension: Secondary | ICD-10-CM | POA: Diagnosis not present

## 2017-06-01 DIAGNOSIS — E1165 Type 2 diabetes mellitus with hyperglycemia: Secondary | ICD-10-CM | POA: Diagnosis not present

## 2017-06-01 DIAGNOSIS — E7800 Pure hypercholesterolemia, unspecified: Secondary | ICD-10-CM | POA: Diagnosis not present

## 2017-06-01 DIAGNOSIS — E049 Nontoxic goiter, unspecified: Secondary | ICD-10-CM | POA: Diagnosis not present

## 2017-06-05 DIAGNOSIS — I10 Essential (primary) hypertension: Secondary | ICD-10-CM | POA: Diagnosis not present

## 2017-06-05 DIAGNOSIS — E1165 Type 2 diabetes mellitus with hyperglycemia: Secondary | ICD-10-CM | POA: Diagnosis not present

## 2017-06-05 DIAGNOSIS — E7800 Pure hypercholesterolemia, unspecified: Secondary | ICD-10-CM | POA: Diagnosis not present

## 2017-06-05 DIAGNOSIS — E049 Nontoxic goiter, unspecified: Secondary | ICD-10-CM | POA: Diagnosis not present

## 2017-06-06 DIAGNOSIS — L97419 Non-pressure chronic ulcer of right heel and midfoot with unspecified severity: Secondary | ICD-10-CM | POA: Diagnosis not present

## 2017-06-06 DIAGNOSIS — L98492 Non-pressure chronic ulcer of skin of other sites with fat layer exposed: Secondary | ICD-10-CM | POA: Diagnosis not present

## 2017-06-06 DIAGNOSIS — L97112 Non-pressure chronic ulcer of right thigh with fat layer exposed: Secondary | ICD-10-CM | POA: Diagnosis not present

## 2017-06-06 DIAGNOSIS — L97512 Non-pressure chronic ulcer of other part of right foot with fat layer exposed: Secondary | ICD-10-CM | POA: Diagnosis not present

## 2017-06-06 DIAGNOSIS — L97519 Non-pressure chronic ulcer of other part of right foot with unspecified severity: Secondary | ICD-10-CM | POA: Diagnosis not present

## 2017-06-06 DIAGNOSIS — I70235 Atherosclerosis of native arteries of right leg with ulceration of other part of foot: Secondary | ICD-10-CM | POA: Diagnosis not present

## 2017-06-06 DIAGNOSIS — L97819 Non-pressure chronic ulcer of other part of right lower leg with unspecified severity: Secondary | ICD-10-CM | POA: Diagnosis not present

## 2017-06-06 DIAGNOSIS — L89610 Pressure ulcer of right heel, unstageable: Secondary | ICD-10-CM | POA: Diagnosis not present

## 2017-06-12 ENCOUNTER — Other Ambulatory Visit: Payer: Self-pay

## 2017-06-12 ENCOUNTER — Ambulatory Visit (INDEPENDENT_AMBULATORY_CARE_PROVIDER_SITE_OTHER): Payer: BLUE CROSS/BLUE SHIELD | Admitting: Surgery

## 2017-06-12 ENCOUNTER — Encounter: Payer: Self-pay | Admitting: Surgery

## 2017-06-12 ENCOUNTER — Encounter: Payer: Self-pay | Admitting: *Deleted

## 2017-06-12 VITALS — BP 152/85 | HR 90 | Temp 97.0°F | Resp 20 | Ht 67.0 in | Wt 251.0 lb

## 2017-06-12 DIAGNOSIS — I7025 Atherosclerosis of native arteries of other extremities with ulceration: Secondary | ICD-10-CM

## 2017-06-12 DIAGNOSIS — I6523 Occlusion and stenosis of bilateral carotid arteries: Secondary | ICD-10-CM | POA: Diagnosis not present

## 2017-06-12 DIAGNOSIS — I6522 Occlusion and stenosis of left carotid artery: Secondary | ICD-10-CM

## 2017-06-12 DIAGNOSIS — Z01812 Encounter for preprocedural laboratory examination: Secondary | ICD-10-CM

## 2017-06-12 NOTE — H&P (View-Only) (Signed)
[facility] [place]  Patient name: [name] [name] MRN: [id number] DOB: [birth year] Sex: female   REASON FOR VISIT:    Follow up  [place] ILLNESS:    [name] [name] is a 63 y.o. female who has been experiencing bilateral leg pain for several months.  She has trouble walking more than 200 feet.  Her right leg bothers her more than the left.  She underwent angiography which revealed high-grade stenotic lesion within the common femoral artery and superficial femoral artery occlusion.  Her preoperative evaluation revealed a high-grade right carotid stenosis and possible left carotid occlusion.  She underwent a CT angiogram that showed a string sign in her left carotid artery.  The stenosis in the right was about 75%.  She presented to the hospital with staph infection of her right leg with gangrenous changes to her toe.  Because of this I elected to proceed with right leg revascularization prior to carotid surgery.   On 04/07/2017 she underwent a right common femoral to below-knee popliteal artery bypass graft with ipsilateral translocated non-reversed saphenous vein as well as right iliofemoral endarterectomy with vein patch angioplasty.  She is doing dressing changes to 1 of her vein harvest incisions.  She has intermittently been on antibiotics.  Her right toe is healing.  She remains asymptomatic from her carotid disease.  She denies numbness or weakness in either extremity.  She denies slurred speech and amaurosis fugax.   PAST MEDICAL HISTORY:   Past Medical History:  Diagnosis Date  . Abdominal abscess   . Allergic rhinitis   . Asthma   . Bacteremia 03/2017  . Carotid stenosis, left 04/2009   50-69%  . Colon polyps 12/05  . COPD (chronic obstructive pulmonary disease) ([place])   . Depression   . Diabetes mellitus    followed by Dr. Chalmers Cater  . Folate deficiency   . Hyperlipidemia   . Hypertension   . Lumbar  disc disease    h/o hnp with repair  . Obesity, morbid (more than 100 lbs over ideal weight or BMI > 40) ([facility])   . Thrombocytopenia ([place])    work up with Dr. Griffith Citron neg in 2000  . Tobacco abuse      FAMILY HISTORY:   Family History  Problem Relation Age of Onset  . Asthma Mother   . Hypertension Mother   . Stroke Father   . Alcohol abuse Father   . Cancer Sister        brain stem tumor  . Suicidality Sister   . Cancer Sister        breast  . Suicidality Brother   . Dementia Sister     SOCIAL HISTORY:   Social History   Tobacco Use  . Smoking status: Current Some Day Smoker    Years: 45.00    Types: Cigarettes  . Smokeless tobacco: Never Used  . Tobacco comment: 1 pk lasts 3-4 days.   Substance Use Topics  . Alcohol use: No     ALLERGIES:   Allergies  Allergen Reactions  . Iohexol Rash    Pt states she had a rash 35-40 yrs ago during a procedure.  Benadryl was given and pt was fine in 30 mins. She has had multiple CT's since with premeds and has done fine.  No anaphylaxis per pt. I updated this record. [name], RTRCT  03/06/17  . Morphine And Related Nausea And Vomiting    Makes her crawl out of  her skin  . Penicillins Rash    Has patient had a PCN reaction causing immediate rash, facial/tongue/throat swelling, SOB or lightheadedness with hypotension: Yes Has patient had a PCN reaction causing severe rash involving mucus membranes or skin necrosis: No Has patient had a PCN reaction that required hospitalization: No Has patient had a PCN reaction occurring within the last 10 years: No Can take amoxicillin     . Sulfa Antibiotics Rash     CURRENT MEDICATIONS:   Current Outpatient Medications  Medication Sig Dispense Refill  . albuterol (PROVENTIL HFA;VENTOLIN HFA) 108 (90 BASE) MCG/ACT inhaler Inhale 2 puffs into the lungs every 6 (six) hours as needed for wheezing.    Marland Kitchen aspirin 81 MG EC tablet Take 81 mg by mouth daily.      Marland Kitchen atorvastatin  (LIPITOR) 20 MG tablet Take 1 tablet (20 mg total) by mouth at bedtime. 90 tablet 3  . cephALEXin (KEFLEX) 500 MG capsule Take 1 capsule (500 mg total) by mouth 4 (four) times daily. X 5 days 20 capsule 0  . Cholecalciferol (VITAMIN D3) 1000 UNITS CAPS Take 1,000 Units by mouth 2 (two) times daily.     . folic acid (FOLVITE) 644 MCG tablet Take 800 mcg by mouth daily.     Marland Kitchen gabapentin (NEURONTIN) 300 MG capsule Take 1 capsule (300 mg total) by mouth 3 (three) times daily. 90 capsule 1  . hydrochlorothiazide (HYDRODIURIL) 25 MG tablet TAKE 1 TABLET DAILY 30 tablet 2  . insulin aspart protamine - aspart (NOVOLOG MIX 70/30 FLEXPEN) (70-30) 100 UNIT/ML FlexPen Inject 60 Units into the skin 2 (two) times daily.    Marland Kitchen lisinopril (PRINIVIL,ZESTRIL) 5 MG tablet Take 5 mg by mouth daily.    . sertraline (ZOLOFT) 100 MG tablet Take 100 mg by mouth once daily 180 tablet 1   No current facility-administered medications for this visit.     REVIEW OF SYSTEMS:   [X]  denotes positive finding, [ ]  denotes negative finding Cardiac  Comments:  Chest pain or chest pressure:    Shortness of breath upon exertion:    Short of breath when lying flat:    Irregular heart rhythm:        Vascular    Pain in calf, thigh, or hip brought on by ambulation:    Pain in feet at night that wakes you up from your sleep:     Blood clot in your veins:    Leg swelling:         Pulmonary    Oxygen at home:    Productive cough:     Wheezing:         Neurologic    Sudden weakness in arms or legs:     Sudden numbness in arms or legs:     Sudden onset of difficulty speaking or slurred speech:    Temporary loss of vision in one eye:     Problems with dizziness:         Gastrointestinal    Blood in stool:     Vomited blood:         Genitourinary    Burning when urinating:     Blood in urine:        Psychiatric    Major depression:         Hematologic    Bleeding problems:    Problems with blood clotting too  easily:        Skin    Rashes or ulcers:  Constitutional    Fever or chills:      PHYSICAL EXAM:   Vitals:   06/12/17 0953  BP: (!) 152/85  Pulse: 90  Resp: 20  Temp: (!) 97 F (36.1 C)  TempSrc: Oral  SpO2: 94%  Weight: 251 lb (113.9 kg)  Height: 5\' 7"  (1.702 m)    GENERAL: The patient is a well-nourished female, in no acute distress. The vital signs are documented above. CARDIAC: There is a regular rate and rhythm.  VASCULAR: Residual cavity and vein harvest incision at the level of the knee.  All the incisions have healed. PULMONARY: Non-labored respirations MUSCULOSKELETAL: There are no major deformities or cyanosis. NEUROLOGIC: No focal weakness or paresthesias are detected. SKIN: Eschar on right second toe. PSYCHIATRIC: The patient has a normal affect.  STUDIES:   None  MEDICAL ISSUES:   Right leg atherosclerotic disease: Status post right femoral-popliteal bypass graft for ulcer disease.  Her leg continues to improve.  She will need an ultrasound of her bypass graft in a month or 2.  Carotid disease: Her initial ultrasound suggested possible left carotid occlusion.  I obtained a CT scan to further evaluate this and she was found to have a string sign in her left carotid artery.  Because of the urgent nature of her ulcer and infection on her right leg she had a right femoral-popliteal bypass graft, prior to addressing her carotid disease.  She is recovered from her operation and now I feel it is time to proceed with her carotid surgery.  I am going to repeat her CT angiogram to confirm that the left side remains patent.  If so I will proceed with a left carotid endarterectomy.  I discussed the risks and benefits of the operation including the risk of stroke nerve injury, and cardiopulmonary complications with the patient.  She wishes to proceed.  If her CT scan shows that the left side has occluded, I would proceed with right carotid endarterectomy as the side has  greater than 75% stenosis on CT scan and 80-99% stenosis on ultrasound.  I have scheduled for carotid endarterectomy which will most likely be a left carotid endarterectomy on Friday, April 12    [name], MD [facility] [phone] Pager [phone]

## 2017-06-12 NOTE — Progress Notes (Signed)
[facility] [place]  Patient name: [name] [name] MRN: [id number] DOB: Jan 26, 1956 Sex: female   REASON FOR VISIT:    Follow up  [place] ILLNESS:    [name] [name] is a 62 y.o. female who has been experiencing bilateral leg pain for several months.  She has trouble walking more than 200 feet.  Her right leg bothers her more than the left.  She underwent angiography which revealed high-grade stenotic lesion within the common femoral artery and superficial femoral artery occlusion.  Her preoperative evaluation revealed a high-grade right carotid stenosis and possible left carotid occlusion.  She underwent a CT angiogram that showed a string sign in her left carotid artery.  The stenosis in the right was about 75%.  She presented to the hospital with staph infection of her right leg with gangrenous changes to her toe.  Because of this I elected to proceed with right leg revascularization prior to carotid surgery.   On 04/07/2017 she underwent a right common femoral to below-knee popliteal artery bypass graft with ipsilateral translocated non-reversed saphenous vein as well as right iliofemoral endarterectomy with vein patch angioplasty.  She is doing dressing changes to 1 of her vein harvest incisions.  She has intermittently been on antibiotics.  Her right toe is healing.  She remains asymptomatic from her carotid disease.  She denies numbness or weakness in either extremity.  She denies slurred speech and amaurosis fugax.   PAST MEDICAL HISTORY:   Past Medical History:  Diagnosis Date  . Abdominal abscess   . Allergic rhinitis   . Asthma   . Bacteremia 03/2017  . Carotid stenosis, left 04/2009   50-69%  . Colon polyps 12/05  . COPD (chronic obstructive pulmonary disease) ([place])   . Depression   . Diabetes mellitus    followed by Dr. Chalmers Cater  . Folate deficiency   . Hyperlipidemia   . Hypertension   . Lumbar  disc disease    h/o hnp with repair  . Obesity, morbid (more than 100 lbs over ideal weight or BMI > 40) ([facility])   . Thrombocytopenia ([place])    work up with Dr. Griffith Citron neg in 2000  . Tobacco abuse      FAMILY HISTORY:   Family History  Problem Relation Age of Onset  . Asthma Mother   . Hypertension Mother   . Stroke Father   . Alcohol abuse Father   . Cancer Sister        brain stem tumor  . Suicidality Sister   . Cancer Sister        breast  . Suicidality Brother   . Dementia Sister     SOCIAL HISTORY:   Social History   Tobacco Use  . Smoking status: Current Some Day Smoker    Years: 45.00    Types: Cigarettes  . Smokeless tobacco: Never Used  . Tobacco comment: 1 pk lasts 3-4 days.   Substance Use Topics  . Alcohol use: No     ALLERGIES:   Allergies  Allergen Reactions  . Iohexol Rash    Pt states she had a rash 35-40 yrs ago during a procedure.  Benadryl was given and pt was fine in 30 mins. She has had multiple CT's since with premeds and has done fine.  No anaphylaxis per pt. I updated this record. [name], RTRCT  03/06/17  . Morphine And Related Nausea And Vomiting    Makes her crawl out of  her skin  . Penicillins Rash    Has patient had a PCN reaction causing immediate rash, facial/tongue/throat swelling, SOB or lightheadedness with hypotension: Yes Has patient had a PCN reaction causing severe rash involving mucus membranes or skin necrosis: No Has patient had a PCN reaction that required hospitalization: No Has patient had a PCN reaction occurring within the last 10 years: No Can take amoxicillin     . Sulfa Antibiotics Rash     CURRENT MEDICATIONS:   Current Outpatient Medications  Medication Sig Dispense Refill  . albuterol (PROVENTIL HFA;VENTOLIN HFA) 108 (90 BASE) MCG/ACT inhaler Inhale 2 puffs into the lungs every 6 (six) hours as needed for wheezing.    Marland Kitchen aspirin 81 MG EC tablet Take 81 mg by mouth daily.      Marland Kitchen atorvastatin  (LIPITOR) 20 MG tablet Take 1 tablet (20 mg total) by mouth at bedtime. 90 tablet 3  . cephALEXin (KEFLEX) 500 MG capsule Take 1 capsule (500 mg total) by mouth 4 (four) times daily. X 5 days 20 capsule 0  . Cholecalciferol (VITAMIN D3) 1000 UNITS CAPS Take 1,000 Units by mouth 2 (two) times daily.     . folic acid (FOLVITE) 500 MCG tablet Take 800 mcg by mouth daily.     Marland Kitchen gabapentin (NEURONTIN) 300 MG capsule Take 1 capsule (300 mg total) by mouth 3 (three) times daily. 90 capsule 1  . hydrochlorothiazide (HYDRODIURIL) 25 MG tablet TAKE 1 TABLET DAILY 30 tablet 2  . insulin aspart protamine - aspart (NOVOLOG MIX 70/30 FLEXPEN) (70-30) 100 UNIT/ML FlexPen Inject 60 Units into the skin 2 (two) times daily.    Marland Kitchen lisinopril (PRINIVIL,ZESTRIL) 5 MG tablet Take 5 mg by mouth daily.    . sertraline (ZOLOFT) 100 MG tablet Take 100 mg by mouth once daily 180 tablet 1   No current facility-administered medications for this visit.     REVIEW OF SYSTEMS:   [X]  denotes positive finding, [ ]  denotes negative finding Cardiac  Comments:  Chest pain or chest pressure:    Shortness of breath upon exertion:    Short of breath when lying flat:    Irregular heart rhythm:        Vascular    Pain in calf, thigh, or hip brought on by ambulation:    Pain in feet at night that wakes you up from your sleep:     Blood clot in your veins:    Leg swelling:         Pulmonary    Oxygen at home:    Productive cough:     Wheezing:         Neurologic    Sudden weakness in arms or legs:     Sudden numbness in arms or legs:     Sudden onset of difficulty speaking or slurred speech:    Temporary loss of vision in one eye:     Problems with dizziness:         Gastrointestinal    Blood in stool:     Vomited blood:         Genitourinary    Burning when urinating:     Blood in urine:        Psychiatric    Major depression:         Hematologic    Bleeding problems:    Problems with blood clotting too  easily:        Skin    Rashes or ulcers:  Constitutional    Fever or chills:      PHYSICAL EXAM:   Vitals:   06/12/17 0953  BP: (!) 152/85  Pulse: 90  Resp: 20  Temp: (!) 97 F (36.1 C)  TempSrc: Oral  SpO2: 94%  Weight: 251 lb (113.9 kg)  Height: 5\' 7"  (1.702 m)    GENERAL: The patient is a well-nourished female, in no acute distress. The vital signs are documented above. CARDIAC: There is a regular rate and rhythm.  VASCULAR: Residual cavity and vein harvest incision at the level of the knee.  All the incisions have healed. PULMONARY: Non-labored respirations MUSCULOSKELETAL: There are no major deformities or cyanosis. NEUROLOGIC: No focal weakness or paresthesias are detected. SKIN: Eschar on right second toe. PSYCHIATRIC: The patient has a normal affect.  STUDIES:   None  MEDICAL ISSUES:   Right leg atherosclerotic disease: Status post right femoral-popliteal bypass graft for ulcer disease.  Her leg continues to improve.  She will need an ultrasound of her bypass graft in a month or 2.  Carotid disease: Her initial ultrasound suggested possible left carotid occlusion.  I obtained a CT scan to further evaluate this and she was found to have a string sign in her left carotid artery.  Because of the urgent nature of her ulcer and infection on her right leg she had a right femoral-popliteal bypass graft, prior to addressing her carotid disease.  She is recovered from her operation and now I feel it is time to proceed with her carotid surgery.  I am going to repeat her CT angiogram to confirm that the left side remains patent.  If so I will proceed with a left carotid endarterectomy.  I discussed the risks and benefits of the operation including the risk of stroke nerve injury, and cardiopulmonary complications with the patient.  She wishes to proceed.  If her CT scan shows that the left side has occluded, I would proceed with right carotid endarterectomy as the side has  greater than 75% stenosis on CT scan and 80-99% stenosis on ultrasound.  I have scheduled for carotid endarterectomy which will most likely be a left carotid endarterectomy on Friday, April 12    [name], MD [facility] [phone] Pager [phone]

## 2017-06-13 ENCOUNTER — Other Ambulatory Visit: Payer: Self-pay | Admitting: *Deleted

## 2017-06-13 ENCOUNTER — Telehealth: Payer: Self-pay | Admitting: Surgery

## 2017-06-13 NOTE — Telephone Encounter (Signed)
Pt needs a pre op CTA Neck as per blue dc 06/12/17. Sched CTA 06/23/17 at 10:30 at [facility]. Spoke to pt to inform them of appt and give cta instructions. Sent pre cert stf mssg.

## 2017-06-14 NOTE — Progress Notes (Signed)
Phone call to patient to inform her of 13 hr prep to be called into [facility] [address]. Pt informed to take: 06/22/17 9:30pm : 50mg  Prednisone 06/23/17 3:30am : 50mg  Prednisone 06/23/17 9:30am : 50mg  Prednisone w/                                    50mg  Benadryl Pt verbalized understanding. Script called into pharmacy.

## 2017-06-19 DIAGNOSIS — L97519 Non-pressure chronic ulcer of other part of right foot with unspecified severity: Secondary | ICD-10-CM | POA: Diagnosis not present

## 2017-06-19 DIAGNOSIS — L97812 Non-pressure chronic ulcer of other part of right lower leg with fat layer exposed: Secondary | ICD-10-CM | POA: Diagnosis not present

## 2017-06-19 DIAGNOSIS — L97112 Non-pressure chronic ulcer of right thigh with fat layer exposed: Secondary | ICD-10-CM | POA: Diagnosis not present

## 2017-06-19 DIAGNOSIS — I70235 Atherosclerosis of native arteries of right leg with ulceration of other part of foot: Secondary | ICD-10-CM | POA: Diagnosis not present

## 2017-06-19 DIAGNOSIS — L97512 Non-pressure chronic ulcer of other part of right foot with fat layer exposed: Secondary | ICD-10-CM | POA: Diagnosis not present

## 2017-06-19 DIAGNOSIS — L89610 Pressure ulcer of right heel, unstageable: Secondary | ICD-10-CM | POA: Diagnosis not present

## 2017-06-20 ENCOUNTER — Telehealth: Payer: Self-pay | Admitting: *Deleted

## 2017-06-20 NOTE — Telephone Encounter (Signed)
Patient informed of time change for surgery on 06/30/17 to be at [facility] admitting department at 8 am.

## 2017-06-23 ENCOUNTER — Encounter (HOSPITAL_COMMUNITY): Payer: Self-pay

## 2017-06-23 ENCOUNTER — Ambulatory Visit
Admission: RE | Admit: 2017-06-23 | Discharge: 2017-06-23 | Disposition: A | Discharge status: Home / Self Care | Payer: BLUE CROSS/BLUE SHIELD | Source: Ambulatory Visit | Attending: Surgery | Admitting: Surgery

## 2017-06-23 DIAGNOSIS — I6523 Occlusion and stenosis of bilateral carotid arteries: Secondary | ICD-10-CM

## 2017-06-23 DIAGNOSIS — I6522 Occlusion and stenosis of left carotid artery: Secondary | ICD-10-CM

## 2017-06-23 DIAGNOSIS — Z01812 Encounter for preprocedural laboratory examination: Secondary | ICD-10-CM

## 2017-06-23 MED ORDER — IOPAMIDOL (ISOVUE-370) INJECTION 76%
75.0000 mL | Freq: Once | INTRAVENOUS | Status: AC | PRN
  Administered 2017-06-23: 75 mL via INTRAVENOUS

## 2017-06-23 NOTE — Pre-Procedure Instructions (Signed)
[name] [name]  06/23/2017      [id number] [address] [place] [address] Phone: [phone] Fax: [phone]  [id number] [place], [place] [address]  [place] [address] Phone: [phone] Fax: [phone]    Your procedure is scheduled on Friday April 12.  Report to [facility] Admitting at 8:15 A.M.  Call this number if you have problems the morning of surgery:  [phone]   Remember:  Do not eat food or drink liquids after midnight.  Take these medicines the morning of surgery with A SIP OF WATER:   Aspirin Gabapentin (neurontin) Cephalexin (Keflex) Sertraline (soloft) Albuterol inhaler if needed (please bring to hospital with you)  TAKE 70% of Novolin 70/30 insulin the day before surgery (28 units)  DO NOT TAKE Novolin 70/30 insulin the day of surgery.  7 days prior to surgery STOP taking any Aleve, Naproxen, Ibuprofen, Motrin, Advil, Goody's, BC's, all herbal medications, fish oil, and all vitamins      How to Manage Your Diabetes Before and After Surgery  Why is it important to control my blood sugar before and after surgery? . Improving blood sugar levels before and after surgery helps healing and can limit problems. . A way of improving blood sugar control is eating a healthy diet by: o  Eating less sugar and carbohydrates o  Increasing activity/exercise o  Talking with your doctor about reaching your blood sugar goals . High blood sugars (greater than 180 mg/dL) can raise your risk of infections and slow your recovery, so you will need to focus on controlling your diabetes during the weeks before surgery. . Make sure that the doctor who takes care of your diabetes knows about your planned surgery including the date and location.  How do I manage my blood sugar before surgery? . Check your  blood sugar at least 4 times a day, starting 2 days before surgery, to make sure that the level is not too high or low. o Check your blood sugar the morning of your surgery when you wake up and every 2 hours until you get to the Short Stay unit. . If your blood sugar is less than 70 mg/dL, you will need to treat for low blood sugar: o Do not take insulin. o Treat a low blood sugar (less than 70 mg/dL) with  cup of clear juice (cranberry or apple), 4 glucose tablets, OR glucose gel. Recheck blood sugar in 15 minutes after treatment (to make sure it is greater than 70 mg/dL). If your blood sugar is not greater than 70 mg/dL on recheck, call [phone] o  for further instructions. . Report your blood sugar to the short stay nurse when you get to Short Stay.  . If you are admitted to the hospital after surgery: o Your blood sugar will be checked by the staff and you will probably be given insulin after surgery (instead of oral diabetes medicines) to make sure you have good blood sugar levels. o The goal for blood sugar control after surgery is 80-180 mg/dL.               Do not wear jewelry, make-up or nail polish.  Do not wear lotions, powders, or perfumes, or deodorant.  Do not shave 48 hours prior to surgery.  Men may shave face and neck.  Do not bring valuables  to the hospital.  [facility] is not responsible for any belongings or valuables.  Contacts, dentures or bridgework may not be worn into surgery.  Leave your suitcase in the car.  After surgery it may be brought to your room.  For patients admitted to the hospital, discharge time will be determined by your treatment team.  Patients discharged the day of surgery will not be allowed to drive home.   Special instructions:   [place]- Preparing For Surgery  Before surgery, you can play an important role. Because skin is not sterile, your skin needs to be as free of germs as possible. You can reduce the number of germs  on your skin by washing with CHG (chlorahexidine gluconate) Soap before surgery.  CHG is an antiseptic cleaner which kills germs and bonds with the skin to continue killing germs even after washing.  Please do not use if you have an allergy to CHG or antibacterial soaps. If your skin becomes reddened/irritated stop using the CHG.  Do not shave (including legs and underarms) for at least 48 hours prior to first CHG shower. It is OK to shave your face.  Please follow these instructions carefully.   1. Shower the NIGHT BEFORE SURGERY and the MORNING OF SURGERY with CHG.   2. If you chose to wash your hair, wash your hair first as usual with your normal shampoo.  3. After you shampoo, rinse your hair and body thoroughly to remove the shampoo.  4. Use CHG as you would any other liquid soap. You can apply CHG directly to the skin and wash gently with a scrungie or a clean washcloth.   5. Apply the CHG Soap to your body ONLY FROM THE NECK DOWN.  Do not use on open wounds or open sores. Avoid contact with your eyes, ears, mouth and genitals (private parts). Wash Face and genitals (private parts)  with your normal soap.  6. Wash thoroughly, paying special attention to the area where your surgery will be performed.  7. Thoroughly rinse your body with warm water from the neck down.  8. DO NOT shower/wash with your normal soap after using and rinsing off the CHG Soap.  9. Pat yourself dry with a CLEAN TOWEL.  10. Wear CLEAN PAJAMAS to bed the night before surgery, wear comfortable clothes the morning of surgery  11. Place CLEAN SHEETS on your bed the night of your first shower and DO NOT SLEEP WITH PETS.    Day of Surgery: Do not apply any deodorants/lotions. Please wear clean clothes to the hospital/surgery center.      Please read over the following fact sheets that you were given. Coughing and Deep Breathing, MRSA Information and Surgical Site Infection Prevention

## 2017-06-26 ENCOUNTER — Encounter (HOSPITAL_COMMUNITY): Payer: Self-pay

## 2017-06-26 ENCOUNTER — Other Ambulatory Visit: Payer: Self-pay

## 2017-06-26 ENCOUNTER — Encounter (HOSPITAL_COMMUNITY)
Admission: RE | Admit: 2017-06-26 | Discharge: 2017-06-26 | Disposition: A | Discharge status: Home / Self Care | Payer: BLUE CROSS/BLUE SHIELD | Source: Ambulatory Visit | Attending: Surgery | Admitting: Surgery

## 2017-06-26 DIAGNOSIS — F329 Major depressive disorder, single episode, unspecified: Secondary | ICD-10-CM | POA: Diagnosis not present

## 2017-06-26 DIAGNOSIS — Z794 Long term (current) use of insulin: Secondary | ICD-10-CM | POA: Diagnosis not present

## 2017-06-26 DIAGNOSIS — E1151 Type 2 diabetes mellitus with diabetic peripheral angiopathy without gangrene: Secondary | ICD-10-CM | POA: Diagnosis not present

## 2017-06-26 DIAGNOSIS — J449 Chronic obstructive pulmonary disease, unspecified: Secondary | ICD-10-CM | POA: Insufficient documentation

## 2017-06-26 DIAGNOSIS — Z01812 Encounter for preprocedural laboratory examination: Secondary | ICD-10-CM | POA: Insufficient documentation

## 2017-06-26 DIAGNOSIS — L97112 Non-pressure chronic ulcer of right thigh with fat layer exposed: Secondary | ICD-10-CM | POA: Diagnosis not present

## 2017-06-26 DIAGNOSIS — F17210 Nicotine dependence, cigarettes, uncomplicated: Secondary | ICD-10-CM | POA: Insufficient documentation

## 2017-06-26 DIAGNOSIS — I10 Essential (primary) hypertension: Secondary | ICD-10-CM | POA: Insufficient documentation

## 2017-06-26 DIAGNOSIS — L89610 Pressure ulcer of right heel, unstageable: Secondary | ICD-10-CM | POA: Diagnosis not present

## 2017-06-26 DIAGNOSIS — L97512 Non-pressure chronic ulcer of other part of right foot with fat layer exposed: Secondary | ICD-10-CM | POA: Diagnosis not present

## 2017-06-26 DIAGNOSIS — I70235 Atherosclerosis of native arteries of right leg with ulceration of other part of foot: Secondary | ICD-10-CM | POA: Diagnosis not present

## 2017-06-26 HISTORY — DX: Psoriasis, unspecified: L40.9

## 2017-06-26 LAB — TYPE AND SCREEN
ABO/RH(D): O POS
Antibody Screen: NEGATIVE

## 2017-06-26 LAB — CBC
HEMATOCRIT: 49 % — AB (ref 36.0–46.0)
HEMOGLOBIN: 15.8 g/dL — AB (ref 12.0–15.0)
MCH: 26.6 pg (ref 26.0–34.0)
MCHC: 32.2 g/dL (ref 30.0–36.0)
MCV: 82.4 fL (ref 78.0–100.0)
Platelets: 127 10*3/uL — ABNORMAL LOW (ref 150–400)
RBC: 5.95 MIL/uL — ABNORMAL HIGH (ref 3.87–5.11)
RDW: 15.8 % — ABNORMAL HIGH (ref 11.5–15.5)
WBC: 9.8 10*3/uL (ref 4.0–10.5)

## 2017-06-26 LAB — COMPREHENSIVE METABOLIC PANEL
ALK PHOS: 81 U/L (ref 38–126)
ALT: 22 U/L (ref 14–54)
AST: 30 U/L (ref 15–41)
Albumin: 3.8 g/dL (ref 3.5–5.0)
Anion gap: 11 (ref 5–15)
BILIRUBIN TOTAL: 0.6 mg/dL (ref 0.3–1.2)
BUN: 10 mg/dL (ref 6–20)
CALCIUM: 9.4 mg/dL (ref 8.9–10.3)
CO2: 29 mmol/L (ref 22–32)
CREATININE: 0.73 mg/dL (ref 0.44–1.00)
Chloride: 96 mmol/L — ABNORMAL LOW (ref 101–111)
Glucose, Bld: 280 mg/dL — ABNORMAL HIGH (ref 65–99)
Potassium: 4 mmol/L (ref 3.5–5.1)
SODIUM: 136 mmol/L (ref 135–145)
TOTAL PROTEIN: 6.6 g/dL (ref 6.5–8.1)

## 2017-06-26 LAB — URINALYSIS, ROUTINE W REFLEX MICROSCOPIC
Bacteria, UA: NONE SEEN
Bilirubin Urine: NEGATIVE
HGB URINE DIPSTICK: NEGATIVE
KETONES UR: NEGATIVE mg/dL
LEUKOCYTES UA: NEGATIVE
Nitrite: NEGATIVE
PROTEIN: NEGATIVE mg/dL
RBC / HPF: NONE SEEN RBC/hpf (ref 0–5)
Specific Gravity, Urine: 1.01 (ref 1.005–1.030)
pH: 7 (ref 5.0–8.0)

## 2017-06-26 LAB — SURGICAL PCR SCREEN
MRSA, PCR: NEGATIVE
Staphylococcus aureus: NEGATIVE

## 2017-06-26 LAB — GLUCOSE, CAPILLARY: Glucose-Capillary: 293 mg/dL — ABNORMAL HIGH (ref 65–99)

## 2017-06-26 LAB — APTT: aPTT: 36 seconds (ref 24–36)

## 2017-06-26 LAB — PROTIME-INR
INR: 1.08
Prothrombin Time: 13.9 seconds (ref 11.4–15.2)

## 2017-06-26 NOTE — Progress Notes (Signed)
PCP - no PCP, sees Dr. Chalmers Cater Cardiologist - Dr. Angelena Form prior to surgery in January for heart clearance  Chest x-ray - N/A EKG - 04/05/17 Stress Test - 03/09/17 ECHO - 03/08/17  Fasting Blood Sugar - typically 80-115. Pt reports CBG 87 this AM, pt ate lunch around 1pm and took her insulin just before this. CBG 293 at PAT appointment. Requesting Last office note and A1c from Dr. Chalmers Cater. Pt reports last A1c 6.7 in March.   Anesthesia review: elevated CBG at PAT appointment.   Patient denies shortness of breath, fever, cough and chest pain at PAT appointment   Patient verbalized understanding of instructions that were given to them at the PAT appointment. Patient was also instructed that they will need to review over the PAT instructions again at home before surgery.

## 2017-06-27 NOTE — Progress Notes (Signed)
Anesthesia Chart Review:  Pt is a 62 year old female scheduled for left CEA on 06/30/2017 with Harold Barban, MD  - Endocrinologist is Jacelyn Pi, MD - Saw cardiologist Lauree Chandler, MD 03/03/17 for pre-op eval for FPBG surgery.  Echo and stress test ordered, results below.  Pt cleared for surgery in comment on stress test results. F/u in 4 months recommended.   PMH includes: HTN, DM, hyperlipidemia, asthma, carotid stenosis, thrombocytopenia (reports work-up in 2000 negative), COPD.  Current smoker.  BMI 39.  S/p R FPBG, R femoral artery endarterectomy 04/07/17.   - Hospitalized 1/15-1/21/19 for bacteremia, gangrenous R 2nd toe, high-grade calcific stenotic lesions in the R common femoral artery with superficial femoral artery occlusion, occluded L superficial femoral artery with reconstitution of above-knee popliteal artery (s/p R FPBG 04/07/17)   Medications include: Albuterol, ASA 81 mg, Lipitor, Chantix (to start post-op), folic acid, HCTZ, Novolin 70/30, lisinopril.  BP (!) 144/60   Pulse 97   Temp 36.6 C   Resp 20   Ht 5\' 7"  (1.702 m)   Wt 249 lb 8 oz (113.2 kg)   SpO2 95%   BMI 39.08 kg/m   Preoperative labs reviewed.   - Glucose 280. HbA1c was 6.7 on 06/01/17 at Dr. Almetta Lovely office.  - Platelets 127  EKG 04/04/17: Sinus tachycardia (101 bpm). Low voltage, precordial leads  CT angio neck 06/23/17:  - 50% diameter stenosis proximal right internal carotid artery due to calcific plaque - Critical stenosis left internal carotid artery with radiographic string sign due to heavily calcified plaque. Decreased caliber and density left internal carotid artery above the plaque. This is similar to the prior CTA. - Moderate stenosis origin of right vertebral artery.  Nuclear stress test 03/09/17:   Nuclear stress EF: 58%.  There was no ST segment deviation noted during stress.  Defect 1: There is a small, fixed defect of moderate severity present in the apical inferior and  apical lateral location. This could be consistent with prior infarct. However, given normal wall motion, favor artifact.  The left ventricular ejection fraction is normal (55-65%).  This is a low risk study.  Echo 03/08/17:  - Left ventricle: The cavity size was normal. Wall thickness was increased in a pattern of moderate LVH. Systolic function was vigorous. The estimated ejection fraction was in the range of 65% to 70%. Wall motion was normal; there were no regional wall motion abnormalities. Doppler parameters are consistent with abnormal left ventricular relaxation (grade 1 diastolic dysfunction). The E/e&' ratio is between 8-15, suggesting indeterminate LV filling pressure. - Aortic valve: Calcified leaflets with very mild aortic stenosis. Mean gradient (S): 11 mm Hg. Peak gradient (S): 21 mm Hg. Valve area (VTI): 2.1 cm^2. Valve area (Vmax): 2.25 cm^2. Valve area (Vmean): 2.19 cm^2. - Aorta: Ascending aortic diameter: 39 mm (S). - Ascending aorta: The ascending aorta was mildly dilated. - Mitral valve: Mildly thickened leaflets . There was trivial regurgitation. - Left atrium: The atrium was normal in size. - Inferior vena cava: The vessel was normal in size. The respirophasic diameter changes were in the normal range (>= 50%), consistent with normal central venous pressure. - Impressions: LVEF 65-70%, moderate wall thickness, normal wall motion, grade 1 DD, indeterminate LV filling pressure, very mild aortic stenosis, dilated ascending aorta to 3.9 cm, normal LA size.  If no changes, I anticipate pt can proceed with surgery as scheduled.   Willeen Cass, FNP-BC [facility] Short Stay Surgical Center/Anesthesiology Phone: [phone] 06/27/2017 2:02 PM

## 2017-06-29 MED ORDER — VANCOMYCIN HCL 10 G IV SOLR
1500.0000 mg | INTRAVENOUS | Status: AC
  Administered 2017-06-30: 1500 mg via INTRAVENOUS
  Filled 2017-06-29: qty 1500

## 2017-06-30 ENCOUNTER — Other Ambulatory Visit: Payer: Self-pay

## 2017-06-30 ENCOUNTER — Inpatient Hospital Stay (HOSPITAL_COMMUNITY)
Admission: RE | Admit: 2017-06-30 | Discharge: 2017-07-02 | DRG: 039 | Disposition: A | Discharge status: Home / Self Care | Payer: BLUE CROSS/BLUE SHIELD | Source: Ambulatory Visit | Attending: Surgery | Admitting: Surgery

## 2017-06-30 ENCOUNTER — Inpatient Hospital Stay (HOSPITAL_COMMUNITY): Payer: BLUE CROSS/BLUE SHIELD | Admitting: Emergency Medicine

## 2017-06-30 ENCOUNTER — Inpatient Hospital Stay (HOSPITAL_COMMUNITY): Payer: BLUE CROSS/BLUE SHIELD

## 2017-06-30 ENCOUNTER — Encounter (HOSPITAL_COMMUNITY)
Admission: RE | Disposition: A | Discharge status: Home / Self Care | Payer: Self-pay | Source: Ambulatory Visit | Attending: Surgery

## 2017-06-30 DIAGNOSIS — I6529 Occlusion and stenosis of unspecified carotid artery: Secondary | ICD-10-CM | POA: Diagnosis present

## 2017-06-30 DIAGNOSIS — E1151 Type 2 diabetes mellitus with diabetic peripheral angiopathy without gangrene: Secondary | ICD-10-CM | POA: Diagnosis present

## 2017-06-30 DIAGNOSIS — Z91041 Radiographic dye allergy status: Secondary | ICD-10-CM

## 2017-06-30 DIAGNOSIS — I6523 Occlusion and stenosis of bilateral carotid arteries: Secondary | ICD-10-CM | POA: Diagnosis not present

## 2017-06-30 DIAGNOSIS — Z885 Allergy status to narcotic agent status: Secondary | ICD-10-CM | POA: Diagnosis not present

## 2017-06-30 DIAGNOSIS — Z794 Long term (current) use of insulin: Secondary | ICD-10-CM

## 2017-06-30 DIAGNOSIS — I6522 Occlusion and stenosis of left carotid artery: Secondary | ICD-10-CM

## 2017-06-30 DIAGNOSIS — R1310 Dysphagia, unspecified: Secondary | ICD-10-CM | POA: Diagnosis not present

## 2017-06-30 DIAGNOSIS — Z7982 Long term (current) use of aspirin: Secondary | ICD-10-CM

## 2017-06-30 DIAGNOSIS — E876 Hypokalemia: Secondary | ICD-10-CM | POA: Diagnosis not present

## 2017-06-30 DIAGNOSIS — Z79899 Other long term (current) drug therapy: Secondary | ICD-10-CM | POA: Diagnosis not present

## 2017-06-30 DIAGNOSIS — I10 Essential (primary) hypertension: Secondary | ICD-10-CM | POA: Diagnosis not present

## 2017-06-30 DIAGNOSIS — J449 Chronic obstructive pulmonary disease, unspecified: Secondary | ICD-10-CM | POA: Diagnosis not present

## 2017-06-30 DIAGNOSIS — F17210 Nicotine dependence, cigarettes, uncomplicated: Secondary | ICD-10-CM | POA: Diagnosis present

## 2017-06-30 DIAGNOSIS — Z882 Allergy status to sulfonamides status: Secondary | ICD-10-CM | POA: Diagnosis not present

## 2017-06-30 DIAGNOSIS — E785 Hyperlipidemia, unspecified: Secondary | ICD-10-CM | POA: Diagnosis not present

## 2017-06-30 DIAGNOSIS — R29810 Facial weakness: Secondary | ICD-10-CM | POA: Diagnosis not present

## 2017-06-30 HISTORY — PX: ENDARTERECTOMY: SHX5162

## 2017-06-30 LAB — GLUCOSE, CAPILLARY
GLUCOSE-CAPILLARY: 248 mg/dL — AB (ref 65–99)
GLUCOSE-CAPILLARY: 204 mg/dL — AB (ref 65–99)
Glucose-Capillary: 178 mg/dL — ABNORMAL HIGH (ref 65–99)
Glucose-Capillary: 179 mg/dL — ABNORMAL HIGH (ref 65–99)

## 2017-06-30 SURGERY — ENDARTERECTOMY, CAROTID
Anesthesia: General | Site: Neck | Laterality: Left

## 2017-06-30 MED ORDER — ESMOLOL HCL 100 MG/10ML IV SOLN
INTRAVENOUS | Status: DC | PRN
  Administered 2017-06-30: 30 mg via INTRAVENOUS
  Administered 2017-06-30: 50 mg via INTRAVENOUS
  Administered 2017-06-30: 20 mg via INTRAVENOUS

## 2017-06-30 MED ORDER — 0.9 % SODIUM CHLORIDE (POUR BTL) OPTIME
TOPICAL | Status: DC | PRN
  Administered 2017-06-30: 1000 mL

## 2017-06-30 MED ORDER — INSULIN ASPART 100 UNIT/ML ~~LOC~~ SOLN: 6.0000 [IU] | Freq: Once | SUBCUTANEOUS | Status: DC

## 2017-06-30 MED ORDER — MIDAZOLAM HCL 2 MG/2ML IJ SOLN
INTRAMUSCULAR | Status: AC
  Filled 2017-06-30: qty 2

## 2017-06-30 MED ORDER — ACETAMINOPHEN 325 MG PO TABS
325.0000 mg | ORAL_TABLET | ORAL | Status: DC | PRN
  Filled 2017-06-30: qty 2

## 2017-06-30 MED ORDER — ENOXAPARIN SODIUM 40 MG/0.4ML ~~LOC~~ SOLN
40.0000 mg | SUBCUTANEOUS | Status: DC
  Administered 2017-07-01: 40 mg via SUBCUTANEOUS
  Filled 2017-06-30: qty 0.4

## 2017-06-30 MED ORDER — PHENOL 1.4 % MT LIQD: 1.0000 | OROMUCOSAL | Status: DC | PRN

## 2017-06-30 MED ORDER — SODIUM CHLORIDE 0.9 % IV SOLN
INTRAVENOUS | Status: DC | PRN
  Administered 2017-06-30: 11:00:00

## 2017-06-30 MED ORDER — SODIUM CHLORIDE 0.9 % IV SOLN: INTRAVENOUS | Status: DC

## 2017-06-30 MED ORDER — CHLORHEXIDINE GLUCONATE 4 % EX LIQD: 60.0000 mL | Freq: Once | CUTANEOUS | Status: DC

## 2017-06-30 MED ORDER — LIDOCAINE HCL (PF) 1 % IJ SOLN
INTRAMUSCULAR | Status: AC
  Filled 2017-06-30: qty 30

## 2017-06-30 MED ORDER — SODIUM CHLORIDE 0.9 % IV SOLN
INTRAVENOUS | Status: DC
  Administered 2017-06-30 – 2017-07-01 (×2): via INTRAVENOUS

## 2017-06-30 MED ORDER — PROTAMINE SULFATE 10 MG/ML IV SOLN
INTRAVENOUS | Status: DC | PRN
  Administered 2017-06-30: 50 mg via INTRAVENOUS

## 2017-06-30 MED ORDER — VANCOMYCIN HCL IN DEXTROSE 1-5 GM/200ML-% IV SOLN
1000.0000 mg | Freq: Two times a day (BID) | INTRAVENOUS | Status: AC
  Administered 2017-06-30 – 2017-07-01 (×2): 1000 mg via INTRAVENOUS
  Filled 2017-06-30 (×2): qty 200

## 2017-06-30 MED ORDER — PANTOPRAZOLE SODIUM 40 MG PO TBEC
40.0000 mg | DELAYED_RELEASE_TABLET | Freq: Every day | ORAL | Status: DC
  Administered 2017-06-30 – 2017-07-02 (×3): 40 mg via ORAL
  Filled 2017-06-30 (×3): qty 1

## 2017-06-30 MED ORDER — GABAPENTIN 300 MG PO CAPS
300.0000 mg | ORAL_CAPSULE | Freq: Three times a day (TID) | ORAL | Status: DC
  Administered 2017-06-30 – 2017-07-02 (×6): 300 mg via ORAL
  Filled 2017-06-30 (×6): qty 1

## 2017-06-30 MED ORDER — SUGAMMADEX SODIUM 500 MG/5ML IV SOLN
INTRAVENOUS | Status: DC | PRN
  Administered 2017-06-30: 230 mg via INTRAVENOUS

## 2017-06-30 MED ORDER — PROPOFOL 10 MG/ML IV BOLUS
INTRAVENOUS | Status: AC
  Filled 2017-06-30: qty 20

## 2017-06-30 MED ORDER — FENTANYL CITRATE (PF) 100 MCG/2ML IJ SOLN
INTRAMUSCULAR | Status: DC | PRN
  Administered 2017-06-30: 50 ug via INTRAVENOUS

## 2017-06-30 MED ORDER — ALBUTEROL SULFATE (2.5 MG/3ML) 0.083% IN NEBU: 3.0000 mL | INHALATION_SOLUTION | Freq: Four times a day (QID) | RESPIRATORY_TRACT | Status: DC | PRN

## 2017-06-30 MED ORDER — ONDANSETRON HCL 4 MG/2ML IJ SOLN
INTRAMUSCULAR | Status: DC | PRN
  Administered 2017-06-30: 4 mg via INTRAVENOUS

## 2017-06-30 MED ORDER — HEPARIN SODIUM (PORCINE) 1000 UNIT/ML IJ SOLN
INTRAMUSCULAR | Status: DC | PRN
  Administered 2017-06-30: 10000 [IU] via INTRAVENOUS

## 2017-06-30 MED ORDER — HYDRALAZINE HCL 20 MG/ML IJ SOLN: 5.0000 mg | INTRAMUSCULAR | Status: DC | PRN

## 2017-06-30 MED ORDER — LACTATED RINGERS IV SOLN
INTRAVENOUS | Status: DC | PRN
  Administered 2017-06-30 (×2): via INTRAVENOUS

## 2017-06-30 MED ORDER — HYDROCHLOROTHIAZIDE 25 MG PO TABS
25.0000 mg | ORAL_TABLET | Freq: Every day | ORAL | Status: DC
  Administered 2017-06-30 – 2017-07-02 (×3): 25 mg via ORAL
  Filled 2017-06-30 (×3): qty 1

## 2017-06-30 MED ORDER — LIDOCAINE HCL (CARDIAC) 20 MG/ML IV SOLN
INTRAVENOUS | Status: DC | PRN
  Administered 2017-06-30: 60 mg via INTRAVENOUS

## 2017-06-30 MED ORDER — LABETALOL HCL 5 MG/ML IV SOLN
INTRAVENOUS | Status: DC | PRN
  Administered 2017-06-30: 15 mg via INTRAVENOUS

## 2017-06-30 MED ORDER — HYDROMORPHONE HCL 1 MG/ML IJ SOLN
0.2500 mg | INTRAMUSCULAR | Status: DC | PRN
  Administered 2017-06-30 (×2): 0.5 mg via INTRAVENOUS

## 2017-06-30 MED ORDER — ONDANSETRON HCL 4 MG/2ML IJ SOLN: 4.0000 mg | Freq: Four times a day (QID) | INTRAMUSCULAR | Status: DC | PRN

## 2017-06-30 MED ORDER — SENNOSIDES-DOCUSATE SODIUM 8.6-50 MG PO TABS: 1.0000 | ORAL_TABLET | Freq: Every evening | ORAL | Status: DC | PRN

## 2017-06-30 MED ORDER — SODIUM CHLORIDE 0.9 % IV SOLN: 500.0000 mL | Freq: Once | INTRAVENOUS | Status: DC | PRN

## 2017-06-30 MED ORDER — FLEET ENEMA 7-19 GM/118ML RE ENEM: 1.0000 | ENEMA | Freq: Once | RECTAL | Status: DC | PRN

## 2017-06-30 MED ORDER — LACTATED RINGERS IV SOLN
INTRAVENOUS | Status: DC
  Administered 2017-06-30: 50 mL/h via INTRAVENOUS

## 2017-06-30 MED ORDER — GUAIFENESIN-DM 100-10 MG/5ML PO SYRP
15.0000 mL | ORAL_SOLUTION | ORAL | Status: DC | PRN
  Administered 2017-07-01: 15 mL via ORAL
  Filled 2017-06-30: qty 15

## 2017-06-30 MED ORDER — METOPROLOL TARTRATE 5 MG/5ML IV SOLN: 2.0000 mg | INTRAVENOUS | Status: DC | PRN

## 2017-06-30 MED ORDER — VARENICLINE TARTRATE 1 MG PO TABS: 1.0000 mg | ORAL_TABLET | Freq: Two times a day (BID) | ORAL | Status: DC

## 2017-06-30 MED ORDER — ASPIRIN 81 MG PO TBEC: 81.0000 mg | DELAYED_RELEASE_TABLET | Freq: Every day | ORAL | Status: DC

## 2017-06-30 MED ORDER — LISINOPRIL 5 MG PO TABS
5.0000 mg | ORAL_TABLET | Freq: Every day | ORAL | Status: DC
  Administered 2017-06-30 – 2017-07-02 (×3): 5 mg via ORAL
  Filled 2017-06-30 (×3): qty 1

## 2017-06-30 MED ORDER — OXYCODONE HCL 5 MG PO TABS
5.0000 mg | ORAL_TABLET | ORAL | Status: DC | PRN
  Administered 2017-06-30 – 2017-07-02 (×10): 10 mg via ORAL
  Filled 2017-06-30 (×9): qty 2

## 2017-06-30 MED ORDER — ASPIRIN EC 325 MG PO TBEC
325.0000 mg | DELAYED_RELEASE_TABLET | Freq: Every day | ORAL | Status: DC
  Administered 2017-06-30 – 2017-07-02 (×3): 325 mg via ORAL
  Filled 2017-06-30 (×3): qty 1

## 2017-06-30 MED ORDER — ACETAMINOPHEN 325 MG RE SUPP: 325.0000 mg | RECTAL | Status: DC | PRN

## 2017-06-30 MED ORDER — HEMOSTATIC AGENTS (NO CHARGE) OPTIME
TOPICAL | Status: DC | PRN
  Administered 2017-06-30: 1 via TOPICAL

## 2017-06-30 MED ORDER — DOCUSATE SODIUM 100 MG PO CAPS
100.0000 mg | ORAL_CAPSULE | Freq: Every day | ORAL | Status: DC
  Administered 2017-07-01: 100 mg via ORAL
  Filled 2017-06-30 (×2): qty 1

## 2017-06-30 MED ORDER — INSULIN ASPART 100 UNIT/ML ~~LOC~~ SOLN
SUBCUTANEOUS | Status: AC
  Administered 2017-06-30: 6 [IU]
  Filled 2017-06-30: qty 1

## 2017-06-30 MED ORDER — PROPOFOL 10 MG/ML IV BOLUS
INTRAVENOUS | Status: DC | PRN
  Administered 2017-06-30: 110 mg via INTRAVENOUS

## 2017-06-30 MED ORDER — BISACODYL 5 MG PO TBEC: 5.0000 mg | DELAYED_RELEASE_TABLET | Freq: Every day | ORAL | Status: DC | PRN

## 2017-06-30 MED ORDER — LABETALOL HCL 5 MG/ML IV SOLN: 10.0000 mg | INTRAVENOUS | Status: DC | PRN

## 2017-06-30 MED ORDER — INSULIN ASPART PROT & ASPART (70-30 MIX) 100 UNIT/ML ~~LOC~~ SUSP
40.0000 [IU] | Freq: Three times a day (TID) | SUBCUTANEOUS | Status: DC
  Administered 2017-06-30 – 2017-07-02 (×5): 40 [IU] via SUBCUTANEOUS
  Filled 2017-06-30: qty 10

## 2017-06-30 MED ORDER — HYDROMORPHONE HCL 1 MG/ML IJ SOLN
INTRAMUSCULAR | Status: AC
  Filled 2017-06-30: qty 1

## 2017-06-30 MED ORDER — PHENYLEPHRINE HCL 10 MG/ML IJ SOLN
INTRAVENOUS | Status: DC | PRN
  Administered 2017-06-30: 20 ug/min via INTRAVENOUS
  Administered 2017-06-30: 12:00:00 via INTRAVENOUS

## 2017-06-30 MED ORDER — ROCURONIUM BROMIDE 100 MG/10ML IV SOLN
INTRAVENOUS | Status: DC | PRN
  Administered 2017-06-30: 50 mg via INTRAVENOUS

## 2017-06-30 MED ORDER — ALUM & MAG HYDROXIDE-SIMETH 200-200-20 MG/5ML PO SUSP: 15.0000 mL | ORAL | Status: DC | PRN

## 2017-06-30 MED ORDER — POTASSIUM CHLORIDE CRYS ER 20 MEQ PO TBCR: 20.0000 meq | EXTENDED_RELEASE_TABLET | Freq: Every day | ORAL | Status: DC | PRN

## 2017-06-30 MED ORDER — SERTRALINE HCL 100 MG PO TABS
100.0000 mg | ORAL_TABLET | Freq: Every day | ORAL | Status: DC
  Administered 2017-07-01 – 2017-07-02 (×2): 100 mg via ORAL
  Filled 2017-06-30 (×2): qty 1

## 2017-06-30 MED ORDER — GLYCOPYRROLATE 0.2 MG/ML IJ SOLN
INTRAMUSCULAR | Status: DC | PRN
  Administered 2017-06-30: 0.2 mg via INTRAVENOUS

## 2017-06-30 MED ORDER — LIDOCAINE 2% (20 MG/ML) 5 ML SYRINGE
INTRAMUSCULAR | Status: AC
  Filled 2017-06-30: qty 5

## 2017-06-30 MED ORDER — SODIUM CHLORIDE 0.9 % IV SOLN
0.0500 ug/kg/min | INTRAVENOUS | Status: AC
  Administered 2017-06-30: .2 ug/kg/min via INTRAVENOUS
  Filled 2017-06-30: qty 5000

## 2017-06-30 MED ORDER — FENTANYL CITRATE (PF) 250 MCG/5ML IJ SOLN
INTRAMUSCULAR | Status: AC
  Filled 2017-06-30: qty 5

## 2017-06-30 MED ORDER — ROCURONIUM BROMIDE 10 MG/ML (PF) SYRINGE
PREFILLED_SYRINGE | INTRAVENOUS | Status: AC
  Filled 2017-06-30: qty 5

## 2017-06-30 MED ORDER — MORPHINE SULFATE (PF) 2 MG/ML IV SOLN: 0.5000 mg | INTRAVENOUS | Status: DC | PRN

## 2017-06-30 MED ORDER — HYDROMORPHONE HCL 1 MG/ML IJ SOLN
1.0000 mg | INTRAMUSCULAR | Status: DC | PRN
  Administered 2017-06-30 (×3): 1 mg via INTRAVENOUS
  Filled 2017-06-30 (×3): qty 1

## 2017-06-30 MED ORDER — OXYCODONE HCL 5 MG PO TABS
ORAL_TABLET | ORAL | Status: AC
  Filled 2017-06-30: qty 2

## 2017-06-30 MED ORDER — ATORVASTATIN CALCIUM 20 MG PO TABS
20.0000 mg | ORAL_TABLET | Freq: Every day | ORAL | Status: DC
  Administered 2017-06-30 – 2017-07-02 (×3): 20 mg via ORAL
  Filled 2017-06-30 (×3): qty 1

## 2017-06-30 MED ORDER — MAGNESIUM SULFATE 2 GM/50ML IV SOLN: 2.0000 g | Freq: Every day | INTRAVENOUS | Status: DC | PRN

## 2017-06-30 MED ORDER — SODIUM CHLORIDE 0.9 % IV SOLN
INTRAVENOUS | Status: AC
  Filled 2017-06-30: qty 1.2

## 2017-06-30 MED ORDER — MIDAZOLAM HCL 5 MG/5ML IJ SOLN
INTRAMUSCULAR | Status: DC | PRN
  Administered 2017-06-30 (×2): 1 mg via INTRAVENOUS

## 2017-06-30 SURGICAL SUPPLY — 56 items
ADH SKN CLS APL DERMABOND .7 (GAUZE/BANDAGES/DRESSINGS) ×1
ADH SKN CLS LQ APL DERMABOND (GAUZE/BANDAGES/DRESSINGS) ×1
CANISTER SUCT 3000ML PPV (MISCELLANEOUS) ×2 IMPLANT
CATH ROBINSON RED A/P 18FR (CATHETERS) ×2 IMPLANT
CATH SUCT 10FR WHISTLE TIP (CATHETERS) ×2 IMPLANT
CLIP VESOCCLUDE MED 6/CT (CLIP) ×2 IMPLANT
CLIP VESOCCLUDE SM WIDE 6/CT (CLIP) ×2 IMPLANT
COVER PROBE W GEL 5X96 (DRAPES) IMPLANT
CRADLE DONUT ADULT HEAD (MISCELLANEOUS) ×2 IMPLANT
DERMABOND ADHESIVE PROPEN (GAUZE/BANDAGES/DRESSINGS) ×1
DERMABOND ADVANCED (GAUZE/BANDAGES/DRESSINGS) ×1
DERMABOND ADVANCED .7 DNX12 (GAUZE/BANDAGES/DRESSINGS) ×1 IMPLANT
DERMABOND ADVANCED .7 DNX6 (GAUZE/BANDAGES/DRESSINGS) IMPLANT
DRAIN CHANNEL 15F RND FF W/TCR (WOUND CARE) IMPLANT
ELECT REM PT RETURN 9FT ADLT (ELECTROSURGICAL) ×2
ELECTRODE REM PT RTRN 9FT ADLT (ELECTROSURGICAL) ×1 IMPLANT
EVACUATOR SILICONE 100CC (DRAIN) IMPLANT
GLOVE BIO SURGEON STRL SZ 6 (GLOVE) ×1 IMPLANT
GLOVE BIO SURGEON STRL SZ7.5 (GLOVE) ×1 IMPLANT
GLOVE BIOGEL PI IND STRL 6 (GLOVE) IMPLANT
GLOVE BIOGEL PI IND STRL 6.5 (GLOVE) IMPLANT
GLOVE BIOGEL PI IND STRL 7.5 (GLOVE) ×1 IMPLANT
GLOVE BIOGEL PI INDICATOR 6 (GLOVE) ×1
GLOVE BIOGEL PI INDICATOR 6.5 (GLOVE) ×2
GLOVE BIOGEL PI INDICATOR 7.5 (GLOVE) ×2
GLOVE SURG SS PI 7.5 STRL IVOR (GLOVE) ×2 IMPLANT
GOWN STRL REUS W/ TWL LRG LVL3 (GOWN DISPOSABLE) ×2 IMPLANT
GOWN STRL REUS W/ TWL XL LVL3 (GOWN DISPOSABLE) ×1 IMPLANT
GOWN STRL REUS W/TWL LRG LVL3 (GOWN DISPOSABLE) ×4
GOWN STRL REUS W/TWL XL LVL3 (GOWN DISPOSABLE) ×2
HEMOSTAT SNOW SURGICEL 2X4 (HEMOSTASIS) IMPLANT
INSERT FOGARTY SM (MISCELLANEOUS) IMPLANT
KIT BASIN OR (CUSTOM PROCEDURE TRAY) ×2 IMPLANT
KIT SHUNT ARGYLE CAROTID ART 6 (VASCULAR PRODUCTS) IMPLANT
KIT TURNOVER KIT B (KITS) ×2 IMPLANT
NDL HYPO 25GX1X1/2 BEV (NEEDLE) IMPLANT
NEEDLE HYPO 25GX1X1/2 BEV (NEEDLE) IMPLANT
NS IRRIG 1000ML POUR BTL (IV SOLUTION) ×6 IMPLANT
PACK CAROTID (CUSTOM PROCEDURE TRAY) ×2 IMPLANT
PAD ARMBOARD 7.5X6 YLW CONV (MISCELLANEOUS) ×4 IMPLANT
PATCH VASC XENOSURE 1CMX6CM (Vascular Products) ×2 IMPLANT
PATCH VASC XENOSURE 1X6 (Vascular Products) IMPLANT
SHUNT CAROTID BYPASS 10 (VASCULAR PRODUCTS) IMPLANT
SHUNT CAROTID BYPASS 12FRX15.5 (VASCULAR PRODUCTS) IMPLANT
SUT ETHILON 3 0 PS 1 (SUTURE) IMPLANT
SUT PROLENE 6 0 BV (SUTURE) ×2 IMPLANT
SUT PROLENE 7 0 BV 1 (SUTURE) IMPLANT
SUT PROLENE 7 0 BV1 MDA (SUTURE) ×1 IMPLANT
SUT SILK 3 0 (SUTURE)
SUT SILK 3-0 18XBRD TIE 12 (SUTURE) IMPLANT
SUT VIC AB 3-0 SH 27 (SUTURE) ×4
SUT VIC AB 3-0 SH 27X BRD (SUTURE) ×2 IMPLANT
SUT VICRYL 4-0 PS2 18IN ABS (SUTURE) ×2 IMPLANT
SYR CONTROL 10ML LL (SYRINGE) IMPLANT
TOWEL GREEN STERILE (TOWEL DISPOSABLE) ×2 IMPLANT
WATER STERILE IRR 1000ML POUR (IV SOLUTION) ×2 IMPLANT

## 2017-06-30 NOTE — Anesthesia Procedure Notes (Signed)
Arterial Line Insertion Start/End4/02/2018 9:00 AM Performed by: Candis Shine, CRNA, CRNA  Patient location: Pre-op. Preanesthetic checklist: patient identified, IV checked, site marked, risks and benefits discussed, surgical consent, monitors and equipment checked, pre-op evaluation, timeout performed and anesthesia consent Lidocaine 1% used for infiltration Left, radial was placed Catheter size: 20 G Hand hygiene performed  and maximum sterile barriers used   Attempts: 1 Procedure performed without using ultrasound guided technique. Following insertion, dressing applied and Biopatch. Post procedure assessment: normal and unchanged  Patient tolerated the procedure well with no immediate complications.

## 2017-06-30 NOTE — Anesthesia Postprocedure Evaluation (Signed)
Anesthesia Post Note  Patient: [name] [name]  Procedure(s) Performed: ENDARTERECTOMY CAROTID LEFT (Left Neck)     Patient location during evaluation: PACU Anesthesia Type: General Level of consciousness: awake and alert Pain management: pain level controlled Vital Signs Assessment: post-procedure vital signs reviewed and stable Respiratory status: spontaneous breathing, nonlabored ventilation, respiratory function stable and patient connected to nasal cannula oxygen Cardiovascular status: blood pressure returned to baseline and stable Postop Assessment: no apparent nausea or vomiting Anesthetic complications: no    Last Vitals:  Vitals:   06/30/17 1300 06/30/17 1317  BP:  (!) 108/49  Pulse: 94   Resp: 18 13  Temp:  37 C  SpO2: 94% (!) 85%    Last Pain:  Vitals:   06/30/17 1317  TempSrc: Oral  PainSc:                  [name],W. EDMOND

## 2017-06-30 NOTE — Anesthesia Procedure Notes (Signed)
Procedure Name: Intubation Date/Time: 06/30/2017 10:03 AM Performed by: Lance Coon, CRNA Pre-anesthesia Checklist: Patient identified, Emergency Drugs available, Suction available and Patient being monitored Patient Re-evaluated:Patient Re-evaluated prior to induction Oxygen Delivery Method: Circle System Utilized Preoxygenation: Pre-oxygenation with 100% oxygen Induction Type: IV induction Ventilation: Oral airway inserted - appropriate to patient size Laryngoscope Size: Mac and 3 Grade View: Grade I Tube type: Oral Tube size: 7.0 mm Number of attempts: 1 Airway Equipment and Method: Stylet and Oral airway Placement Confirmation: ETT inserted through vocal cords under direct vision,  positive ETCO2 and breath sounds checked- equal and bilateral Secured at: 22 cm Tube secured with: Tape Dental Injury: Teeth and Oropharynx as per pre-operative assessment

## 2017-06-30 NOTE — Transfer of Care (Signed)
Immediate Anesthesia Transfer of Care Note  Patient: [name] [name]  Procedure(s) Performed: ENDARTERECTOMY CAROTID LEFT (Left Neck)  Patient Location: PACU  Anesthesia Type:General  Level of Consciousness: awake and patient cooperative  Airway & Oxygen Therapy: Patient Spontanous Breathing and Patient connected to face mask oxygen  Post-op Assessment: Report given to RN and Post -op Vital signs reviewed and stable  Post vital signs: Reviewed and stable  Last Vitals:  Vitals Value Taken Time  BP 143/66 06/30/2017 12:17 PM  Temp    Pulse 90 06/30/2017 12:21 PM  Resp 19 06/30/2017 12:21 PM  SpO2 92 % 06/30/2017 12:21 PM  Vitals shown include unvalidated device data.  Last Pain:  Vitals:   06/30/17 0847  TempSrc:   PainSc: 4       Patients Stated Pain Goal: 3 (79/39/03 0092)  Complications: No apparent anesthesia complications

## 2017-06-30 NOTE — Op Note (Signed)
Patient name: [name] [name] MRN: [id number] DOB: [birth year] Sex: female  06/30/2017 Pre-operative Diagnosis: Asymptomatic   left carotid stenosis Post-operative diagnosis:  Same Surgeon:  [name] Assistants:  Laurence Slate Procedure:    left carotid Endarterectomy with bovine pericardial patch angioplasty Anesthesia:  General Blood Loss:  100 cc  Specimens:  none  Findings:  95 %stenosis; Thrombus:  none  Indications:  62 year old female with string sign on CT scan  Procedure:  The patient was identified in the holding area and taken to [facility]  The patient was then placed supine on the table.   General endotrachial anesthesia was administered.  The patient was prepped and draped in the usual sterile fashion.  A time out was called and antibiotics were administered.  The incision was made along the anterior border of the left sternocleidomastoid muscle.  Cautery was used to dissect through the subcutaneous tissue.  The platysma muscle was divided with cautery.  The internal jugular vein was exposed along its anterior medial border.  The common facial vein was exposed and then divided between 2-0 silk ties and metal clips.  The common carotid artery was then circumferentially exposed and encircled with an umbilical tape.  The vagus nerve was identified and protected.  Next sharp dissection was used to expose the external carotid artery and the superior thyroid artery.  The were encircled with a blue vessel loop and a 2-0 silk tie respectively.  Finally, the internal carotid was carefully dissected free.  An umbilical tape was placed around the internal carotid artery distal to the diseased segment.  The hypoglossal nerve was visualized throughout and protected.  The patient was given systemic heparinization.  A bovine carotid patch was selected and prepared on the back table.  A 10 french shunt was also prepared.  After blood pressure readings were appropriate and the heparin had been  given time to circulate, the internal carotid artery was occluded with a baby Gregory clamp.  The external and common carotid arteries were then occluded with vascular clamps and the 2-0 tie tightened on the superior thyroid artery.  A #11 blade was used to make an arteriotomy in the common carotid artery.  This was extended with Potts scissors along the anterior and lateral border of the common and internal carotid artery.  Approximately 95% stenosis was identified.  There was no thrombus identified.  The 10 french shunt was not placed, as there was excellent backbleeding .  A kleiner kuntz elevator was used to perform endarterectomy.  An eversion endarterectomy was performed in the external carotid artery.  A good distal endpoint was obtained in the internal carotid artery.  The specimen was removed and sent to pathology.  Heparinized saline was used to irrigate the endarterectomized field.  All potential embolic debris was removed.  Bovine pericardial patch angioplasty was then performed using a running 6-0 Prolene. The common internal and external carotid arteries were all appropriately flushed. The artery was again irrigated with heparin saline.  The anastomosis was then secured. The clamp was first released on the external carotid artery followed by the common carotid artery approximately 30 seconds later, bloodflow was reestablish through the internal carotid artery.  Next, a hand-held  Doppler was used to evaluate the signals in the common, external, and internal  carotid arteries, all of which had appropriate signals. I then administered  50 mg protamine. The wound was then irrigated.  After hemostasis was achieved, the carotid sheath was  reapproximated with 3-0 Vicryl. The  platysma muscle was reapproximated with running 3-0 Vicryl. The skin  was closed with 4-0 Vicryl. Dermabond was placed on the skin. The  patient was then successfully extubated. His neurologic exam was  similar to his  preprocedural exam. The patient was then taken to recovery room  in stable condition. There were no complications.     Disposition:  To PACU in stable condition.  Relevant Operative Details:  Extensive plaque in the carotid bifurcation with >95% stenosis.  No shunt utilized as there was excellent backbleeding  V. [name], M.D. [facility] Office: [phone] Pager:  [phone]

## 2017-06-30 NOTE — Anesthesia Preprocedure Evaluation (Addendum)
Anesthesia Evaluation  Patient identified by MRN, date of birth, ID band Patient awake    Reviewed: Allergy & Precautions, H&P , NPO status , Patient's Chart, lab work & pertinent test results  Airway Mallampati: II  TM Distance: >3 FB Neck ROM: Full    Dental no notable dental hx. (+) Edentulous Upper, Edentulous Lower, Dental Advisory Given   Pulmonary asthma , COPD,  COPD inhaler, Current Smoker,    Pulmonary exam normal breath sounds clear to auscultation       Cardiovascular hypertension, Pt. on medications + Peripheral Vascular Disease   Rhythm:Regular Rate:Normal     Neuro/Psych Depression negative neurological ROS     GI/Hepatic negative GI ROS, Neg liver ROS,   Endo/Other  diabetes, Insulin DependentMorbid obesity  Renal/GU negative Renal ROS  negative genitourinary   Musculoskeletal   Abdominal   Peds  Hematology negative hematology ROS (+)   Anesthesia Other Findings   Reproductive/Obstetrics negative OB ROS                            Anesthesia Physical Anesthesia Plan  ASA: III  Anesthesia Plan: General   Post-op Pain Management:    Induction: Intravenous  PONV Risk Score and Plan: 3 and Ondansetron and Treatment may vary due to age or medical condition  Airway Management Planned: Oral ETT  Additional Equipment: Arterial line  Intra-op Plan:   Post-operative Plan: Extubation in OR  Informed Consent: I have reviewed the patients History and Physical, chart, labs and discussed the procedure including the risks, benefits and alternatives for the proposed anesthesia with the patient or authorized representative who has indicated his/her understanding and acceptance.   Dental advisory given  Plan Discussed with: CRNA  Anesthesia Plan Comments:         Anesthesia Quick Evaluation

## 2017-06-30 NOTE — Interval H&P Note (Signed)
History and Physical Interval Note:  06/30/2017 9:01 AM  [name] [name]  has presented today for surgery, with the diagnosis of LEFT CAROTID STENOSIS  The various methods of treatment have been discussed with the patient and family. After consideration of risks, benefits and other options for treatment, the patient has consented to  Procedure(s): ENDARTERECTOMY CAROTID LEFT (Left) as a surgical intervention .  The patient's history has been reviewed, patient examined, no change in status, stable for surgery.  I have reviewed the patient's chart and labs.  Questions were answered to the patient's satisfaction.     [name]

## 2017-06-30 NOTE — Discharge Instructions (Signed)
   [facility]  Discharge Instructions   Carotid Endarterectomy (CEA)  Please refer to the following instructions for your post-procedure care. Your surgeon or physician assistant will discuss any changes with you.  Activity  You are encouraged to walk as much as you can. You can slowly return to normal activities but must avoid strenuous activity and heavy lifting until your doctor tell you it's OK. Avoid activities such as vacuuming or swinging a golf club. You can drive after one week if you are comfortable and you are no longer taking prescription pain medications. It is normal to feel tired for serval weeks after your surgery. It is also normal to have difficulty with sleep habits, eating, and bowel movements after surgery. These will go away with time.  Bathing/Showering  You may shower after you come home. Do not soak in a bathtub, hot tub, or swim until the incision heals completely.  Incision Care  Shower every day. Clean your incision with mild soap and water. Pat the area dry with a clean towel. You do not need a bandage unless otherwise instructed. Do not apply any ointments or creams to your incision. You may have skin glue on your incision. Do not peel it off. It will come off on its own in about one week. Your incision may feel thickened and raised for several weeks after your surgery. This is normal and the skin will soften over time. For Men Only: It's OK to shave around the incision but do not shave the incision itself for 2 weeks. It is common to have numbness under your chin that could last for several months.  Diet  Resume your normal diet. There are no special food restrictions following this procedure. A low fat/low cholesterol diet is recommended for all patients with vascular disease. In order to heal from your surgery, it is CRITICAL to get adequate nutrition. Your body requires vitamins, minerals, and protein. Vegetables are the best  source of vitamins and minerals. Vegetables also provide the perfect balance of protein. Processed food has little nutritional value, so try to avoid this.        Medications  Resume taking all of your medications unless your doctor or physician assistant tells you not to. If your incision is causing pain, you may take over-the- counter pain relievers such as acetaminophen (Tylenol). If you were prescribed a stronger pain medication, please be aware these medications can cause nausea and constipation. Prevent nausea by taking the medication with a snack or meal. Avoid constipation by drinking plenty of fluids and eating foods with a high amount of fiber, such as fruits, vegetables, and grains. Do not take Tylenol if you are taking prescription pain medications.  Follow Up  Our office will schedule a follow up appointment 2-3 weeks following discharge.  Please call us immediately for any of the following conditions  Increased pain, redness, drainage (pus) from your incision site. Fever of 101 degrees or higher. If you should develop stroke (slurred speech, difficulty swallowing, weakness on one side of your body, loss of vision) you should call 911 and go to the nearest emergency room.  Reduce your risk of vascular disease:  Stop smoking. If you would like help call [facility] at 1-800-QUIT-NOW (1-800-784-8669) or [facility] at [phone]. Manage your cholesterol Maintain a desired weight Control your diabetes Keep your blood pressure down  If you have any questions, please call the office at [phone].   

## 2017-07-01 ENCOUNTER — Encounter (HOSPITAL_COMMUNITY): Payer: Self-pay

## 2017-07-01 LAB — CBC
HEMATOCRIT: 33.1 % — AB (ref 36.0–46.0)
HEMOGLOBIN: 10 g/dL — AB (ref 12.0–15.0)
MCH: 25.3 pg — ABNORMAL LOW (ref 26.0–34.0)
MCHC: 30.2 g/dL (ref 30.0–36.0)
MCV: 83.8 fL (ref 78.0–100.0)
Platelets: 69 10*3/uL — ABNORMAL LOW (ref 150–400)
RBC: 3.95 MIL/uL (ref 3.87–5.11)
RDW: 16.2 % — ABNORMAL HIGH (ref 11.5–15.5)
WBC: 6.9 10*3/uL (ref 4.0–10.5)

## 2017-07-01 LAB — POCT ACTIVATED CLOTTING TIME: ACTIVATED CLOTTING TIME: 307 s

## 2017-07-01 LAB — BASIC METABOLIC PANEL
Anion gap: 6 (ref 5–15)
BUN: 8 mg/dL (ref 6–20)
CHLORIDE: 108 mmol/L (ref 101–111)
CO2: 24 mmol/L (ref 22–32)
Calcium: 6.5 mg/dL — ABNORMAL LOW (ref 8.9–10.3)
Creatinine, Ser: 0.41 mg/dL — ABNORMAL LOW (ref 0.44–1.00)
GFR calc Af Amer: >60 mL/min (ref >60)
GFR calc non Af Amer: >60 mL/min (ref >60)
Glucose, Bld: 99 mg/dL (ref 65–99)
POTASSIUM: 2.8 mmol/L — AB (ref 3.5–5.1)
SODIUM: 138 mmol/L (ref 135–145)

## 2017-07-01 MED ORDER — OXYCODONE HCL 5 MG PO TABS: 5.0000 mg | ORAL_TABLET | ORAL | 0 refills | Status: DC | PRN

## 2017-07-01 NOTE — Progress Notes (Addendum)
     Subjective  - Doing well over all, fullness at neck incision and slight tenderness.   Objective (!) 170/77 83 98.5 F (36.9 C) (Oral) 17 93%  Intake/Output Summary (Last 24 hours) at 07/01/2017 0939 Last data filed at 07/01/2017 0640 Gross per 24 hour  Intake 5034.17 ml  Output 1000 ml  Net 4034.17 ml    Palpable radial pulses B Incision with mild fullness, no frank hematoma soft. No tongue deviation slight mandibular nerve palsy, left facial droop. Grip 5/5 Heart RRR Lungs non labored   Assessment/Planning: POD # left CEA   Tolerating PO's with slight soreness to solids. Encouraged small bites and chew well. Ambulating and voided. Disposition stable for discharge home with daughter.  F/U with Dr. Trula Slade in 2-3 weeks.  Roxy Horseman 07/01/2017 9:39 AM --  Laboratory Lab Results: Recent Labs    07/01/17 0318  WBC 6.9  HGB 10.0*  HCT 33.1*  PLT 69*   BMET Recent Labs    07/01/17 0318  NA 138  K 2.8*  CL 108  CO2 24  GLUCOSE 99  BUN 8  CREATININE 0.41*  CALCIUM 6.5*    COAG Lab Results  Component Value Date   INR 1.08 06/26/2017   INR 1.02 04/07/2017   INR 1.06 03/30/2017   No results found for: PTT  I have independently interviewed and examined patient and agree with PA assessment and plan above.  We will decide on discharge later this afternoon.  Davion Meara C. Donzetta Matters, MD [facility] Office: [phone] Pager: [phone]

## 2017-07-02 NOTE — Progress Notes (Addendum)
[facility]  Subjective  -  Doing better without swallowing difficulties.     Objective 124/67 83 98.4 F (36.9 C) (Oral) 20 94%  Intake/Output Summary (Last 24 hours) at 07/02/2017 0959 Last data filed at 07/01/2017 1859 Gross per 24 hour  Intake 360 ml  Output -  Net 360 ml    Left CEA incision with mod soft edema and ecchymosis.  No frank hematoma. No tongue deviation, left marginal mandibular paresis. Palpable radial pulses equal B, grip 5/5, moving all extremities equal. Heart RRR Lungs non labored breathing  Assessment/Planning: POD # 2 left CEA  Hypokalemia will replete prior to discharge home  swallowing issues have resolved, tolerating PO's ambulating, voiding. Disposition stable for discharge home. F/U in our office in 2-3 weeks.    Roxy Horseman 07/02/2017 9:59 AM --  Laboratory Lab Results: Recent Labs    07/01/17 0318  WBC 6.9  HGB 10.0*  HCT 33.1*  PLT 69*   BMET Recent Labs    07/01/17 0318  NA 138  K 2.8*  CL 108  CO2 24  GLUCOSE 99  BUN 8  CREATININE 0.41*  CALCIUM 6.5*    COAG Lab Results  Component Value Date   INR 1.08 06/26/2017   INR 1.02 04/07/2017   INR 1.06 03/30/2017   No results found for: PTT   I have independently interviewed and examined patient and agree with PA assessment and plan above.   Marquay Kruse C. Donzetta Matters, MD [facility] Office: [phone] Pager: [phone]

## 2017-07-03 ENCOUNTER — Encounter (HOSPITAL_COMMUNITY): Payer: Self-pay | Admitting: Surgery

## 2017-07-03 DIAGNOSIS — L89610 Pressure ulcer of right heel, unstageable: Secondary | ICD-10-CM | POA: Diagnosis not present

## 2017-07-03 DIAGNOSIS — L97512 Non-pressure chronic ulcer of other part of right foot with fat layer exposed: Secondary | ICD-10-CM | POA: Diagnosis not present

## 2017-07-03 DIAGNOSIS — I70235 Atherosclerosis of native arteries of right leg with ulceration of other part of foot: Secondary | ICD-10-CM | POA: Diagnosis not present

## 2017-07-03 DIAGNOSIS — L97112 Non-pressure chronic ulcer of right thigh with fat layer exposed: Secondary | ICD-10-CM | POA: Diagnosis not present

## 2017-07-03 NOTE — Discharge Summary (Signed)
[facility] Discharge Summary   Patient ID:  [name] [name] MRN: [id number] DOB/AGE: September 13, 1955 63 y.o.  Admit date: 06/30/2017 Discharge date: 07/03/2017 Date of Surgery: 06/30/2017 Surgeon: Surgeon(s): Serafina Mitchell, MD  Admission Diagnosis: LEFT CAROTID STENOSIS  Discharge Diagnoses:  LEFT CAROTID STENOSIS  Secondary Diagnoses: Past Medical History:  Diagnosis Date  . Abdominal abscess   . Allergic rhinitis   . Asthma   . Bacteremia 03/2017  . Carotid stenosis, left 04/2009   50-69%  . Colon polyps 12/05  . COPD (chronic obstructive pulmonary disease) ([facility])   . Depression   . Diabetes mellitus    followed by Dr. Chalmers Cater  . Folate deficiency   . Hyperlipidemia   . Hypertension   . Lumbar disc disease    h/o hnp with repair  . Obesity, morbid (more than 100 lbs over ideal weight or BMI > 40) ([facility])   . Psoriasis   . Thrombocytopenia ([place] [place])    work up with Dr. Griffith Citron neg in 2000  . Tobacco abuse     Procedure(s): ENDARTERECTOMY CAROTID LEFT  Discharged Condition: good  HPI:  62 y/o female known to our practice was experiencing LE pain underwent angiography which  revealed high-grade stenotic lesion within the common femoral artery and superficial femoral artery occlusion. Now s/p right fem-pop by pass with improvement.  Her preoperative evaluation revealed a high-grade right carotid stenosis and possible left carotid occlusion. She underwent a CT angiogram that showed a string sign in her left carotid artery. The stenosis in the right was about 75%.   Repeat CTA of the neck was reviewed by Dr. Trula Slade which showed: Critical stenosis left internal carotid artery with radiographic string sign due to heavily calcified plaque. Decreased caliber and density left internal carotid artery above the plaque. This is similar to the prior CTA.   She denise symptoms of stroke or TIA to include amaurosis, weakness and aphasia.     Hospital Course:   [name] [name] is a 62 y.o. female is S/P  Procedure(s): ENDARTERECTOMY CAROTID LEFT  Post op she was complaining of swallowing difficulty with solids.  She denied weakness, no tongue deviation with clear speech.  She was observed for another 24 hours.  POD# 2 she states her swallowing issues have resolved.  She does have evidence of marginal mandibular nerve paresis showing left facial droop.    Disposition stable for discharge.  F/U in our office in 2 weeks.    Significant Diagnostic Studies: CBC Lab Results  Component Value Date   WBC 6.9 07/01/2017   HGB 10.0 (L) 07/01/2017   HCT 33.1 (L) 07/01/2017   MCV 83.8 07/01/2017   PLT 69 (L) 07/01/2017    BMET    Component Value Date/Time   NA 138 07/01/2017 0318   K 2.8 (L) 07/01/2017 0318   CL 108 07/01/2017 0318   CO2 24 07/01/2017 0318   GLUCOSE 99 07/01/2017 0318   BUN 8 07/01/2017 0318   CREATININE 0.41 (L) 07/01/2017 0318   CREATININE 0.69 01/28/2013 1110   CALCIUM 6.5 (L) 07/01/2017 0318   GFRNONAA >60 07/01/2017 0318   GFRAA >60 07/01/2017 0318   COAG Lab Results  Component Value Date   INR 1.08 06/26/2017   INR 1.02 04/07/2017   INR 1.06 03/30/2017     Disposition:  Discharge to :Home Discharge Instructions    Call MD for:  redness, tenderness, or signs of infection (pain, swelling, bleeding, redness, odor or green/yellow discharge around  incision site)   Complete by:  As directed    Call MD for:  redness, tenderness, or signs of infection (pain, swelling, bleeding, redness, odor or green/yellow discharge around incision site)   Complete by:  As directed    Call MD for:  severe or increased pain, loss or decreased feeling  in affected limb(s)   Complete by:  As directed    Call MD for:  severe or increased pain, loss or decreased feeling  in affected limb(s)   Complete by:  As directed    Call MD for:  temperature >100.5   Complete by:  As directed    Call MD for:  temperature >100.5   Complete by:  As  directed    Discharge patient   Complete by:  As directed    Discharge disposition:  01-Home or Self Care   Discharge patient date:  07/02/2017   Resume previous diet   Complete by:  As directed    Resume previous diet   Complete by:  As directed      Allergies as of 07/02/2017      Reactions   Iohexol Rash   Pt states she had a rash 35-40 yrs ago during a procedure.  Benadryl was given and pt was fine in 30 mins. She has had multiple CT's since with premeds and has done fine.  No anaphylaxis per pt. I updated this record. [name], RTRCT  03/06/17   Penicillins Rash, Other (See Comments)   PATIENT HAS HAD A PCN REACTION WITH IMMEDIATE RASH, FACIAL/TONGUE/THROAT SWELLING, SOB, OR LIGHTHEADEDNESS WITH HYPOTENSION:  #  #  YES  #  #  Has patient had a PCN reaction causing severe rash involving mucus membranes or skin necrosis: No Has patient had a PCN reaction that required hospitalization: No Has patient had a PCN reaction occurring within the last 10 years: No Can take amoxicillin    Morphine And Related Nausea And Vomiting   Makes her crawl out of her skin   Sulfa Antibiotics Rash      Medication List    TAKE these medications   albuterol 108 (90 Base) MCG/ACT inhaler Commonly known as:  PROVENTIL HFA;VENTOLIN HFA Inhale 2 puffs into the lungs every 6 (six) hours as needed for wheezing.   aspirin 81 MG EC tablet Take 81 mg by mouth daily.   atorvastatin 20 MG tablet Commonly known as:  LIPITOR Take 1 tablet (20 mg total) by mouth at bedtime. What changed:  when to take this   cephALEXin 500 MG capsule Commonly known as:  KEFLEX Take 1 capsule (500 mg total) by mouth 4 (four) times daily. X 5 days   CHANTIX STARTING MONTH PAK 0.5 MG X 11 & 1 MG X 42 tablet Generic drug:  varenicline Take 0.5-1 mg by mouth daily.   CHANTIX CONTINUING MONTH PAK 1 MG tablet Generic drug:  varenicline Take 1 mg by mouth 2 (two) times daily.   clobetasol ointment 0.05 % Commonly  known as:  TEMOVATE Apply 1 application topically 2 (two) times daily.   folic acid 144 MCG tablet Commonly known as:  FOLVITE Take 800 mcg by mouth daily.   gabapentin 300 MG capsule Commonly known as:  NEURONTIN Take 1 capsule (300 mg total) by mouth 3 (three) times daily.   hydrochlorothiazide 25 MG tablet Commonly known as:  HYDRODIURIL TAKE 1 TABLET DAILY   insulin NPH-regular Human (70-30) 100 UNIT/ML injection Commonly known as:  NOVOLIN 70/30 Inject 40  Units into the skin 3 (three) times daily.   lisinopril 5 MG tablet Commonly known as:  PRINIVIL,ZESTRIL Take 5 mg by mouth daily.   oxyCODONE 5 MG immediate release tablet Commonly known as:  Oxy IR/ROXICODONE Take 1 tablet (5 mg total) by mouth every 4 (four) hours as needed for moderate pain.   sertraline 100 MG tablet Commonly known as:  ZOLOFT Take 100 mg by mouth once daily   Vitamin D3 1000 units Caps Take 1,000 Units by mouth 2 (two) times daily.      Verbal and written Discharge instructions given to the patient. Wound care per Discharge AVS Follow-up Information    Serafina Mitchell, MD In 2 weeks.   Specialties:  Vascular Surgery, Cardiology Why:  Office will call you to arrange your appt (sent) Contact information: [address] [place] [address] [phone]         --- For [place] use --- Instructions: Press F2 to tab through selections.  Delete question if not applicable.   Modified Rankin score at D/C (0-6): Rankin Score=0  IV medication needed for:  1. Hypertension: No 2. Hypotension: No  Post-op Complications: No  1. Post-op CVA or TIA: No  If yes: Event classification (right eye, left eye, right cortical, left cortical, verterobasilar, other):   If yes: Timing of event (intra-op, <6 hrs post-op, >=6 hrs post-op, unknown):   2. CN injury: No  If yes: CN  injuried   3. Myocardial infarction: No  If yes: Dx by (EKG or clinical, Troponin):   4.  CHF: No  5.   Dysrhythmia (new): No  6. Wound infection: No  7. Reperfusion symptoms: No  8. Return to OR: No  If yes: return to OR for (bleeding, neurologic, other CEA incision, other):   Discharge medications: Statin use:  Yes ASA use:  Yes Beta blocker use:  No  for medical reason   ACE-Inhibitor use:  Yes P2Y12 Antagonist use: [x ] None, [ ]  Plavix, [ ]  Plasugrel, [ ]  Ticlopinine, [ ]  Ticagrelor, [ ]  Other, [ ]  No for medical reason, [ ]  Non-compliant, [ ]  Not-indicated Anti-coagulant use:  [x ] None, [ ]  Warfarin, [ ]  Rivaroxaban, [ ]  Dabigatran, [ ]  Other, [ ]  No for medical reason, [ ]  Non-compliant, [ ]  Not-indicated  Signed: Roxy Horseman 07/03/2017, 11:37 AM

## 2017-07-04 ENCOUNTER — Telehealth: Payer: Self-pay | Admitting: Surgery

## 2017-07-04 NOTE — Telephone Encounter (Signed)
Spoke to pt for appt 5/6, mld lttr

## 2017-07-04 NOTE — Telephone Encounter (Signed)
-----   Message from Mena Goes, RN sent at 06/30/2017  3:17 PM EDT ----- Regarding: 2-3 weeks post op CEA   ----- Message ----- From: Ulyses Amor, PA-C Sent: 06/30/2017  12:05 PM To: [facility] Charge Pool  S/p carotid CEA f/u with Dr. Trula Slade in 2-3 weeks

## 2017-07-10 DIAGNOSIS — L97112 Non-pressure chronic ulcer of right thigh with fat layer exposed: Secondary | ICD-10-CM | POA: Diagnosis not present

## 2017-07-10 DIAGNOSIS — I70234 Atherosclerosis of native arteries of right leg with ulceration of heel and midfoot: Secondary | ICD-10-CM | POA: Diagnosis not present

## 2017-07-10 DIAGNOSIS — I70235 Atherosclerosis of native arteries of right leg with ulceration of other part of foot: Secondary | ICD-10-CM | POA: Diagnosis not present

## 2017-07-10 DIAGNOSIS — L97812 Non-pressure chronic ulcer of other part of right lower leg with fat layer exposed: Secondary | ICD-10-CM | POA: Diagnosis not present

## 2017-07-17 DIAGNOSIS — I70234 Atherosclerosis of native arteries of right leg with ulceration of heel and midfoot: Secondary | ICD-10-CM | POA: Diagnosis not present

## 2017-07-17 DIAGNOSIS — I70235 Atherosclerosis of native arteries of right leg with ulceration of other part of foot: Secondary | ICD-10-CM | POA: Diagnosis not present

## 2017-07-17 DIAGNOSIS — L89610 Pressure ulcer of right heel, unstageable: Secondary | ICD-10-CM | POA: Diagnosis not present

## 2017-07-17 DIAGNOSIS — L97812 Non-pressure chronic ulcer of other part of right lower leg with fat layer exposed: Secondary | ICD-10-CM | POA: Diagnosis not present

## 2017-07-17 DIAGNOSIS — L97411 Non-pressure chronic ulcer of right heel and midfoot limited to breakdown of skin: Secondary | ICD-10-CM | POA: Diagnosis not present

## 2017-07-17 DIAGNOSIS — L97512 Non-pressure chronic ulcer of other part of right foot with fat layer exposed: Secondary | ICD-10-CM | POA: Diagnosis not present

## 2017-07-17 DIAGNOSIS — L97511 Non-pressure chronic ulcer of other part of right foot limited to breakdown of skin: Secondary | ICD-10-CM | POA: Diagnosis not present

## 2017-07-24 ENCOUNTER — Ambulatory Visit (INDEPENDENT_AMBULATORY_CARE_PROVIDER_SITE_OTHER): Payer: BLUE CROSS/BLUE SHIELD | Admitting: Surgery

## 2017-07-24 ENCOUNTER — Ambulatory Visit (HOSPITAL_COMMUNITY)
Admission: RE | Admit: 2017-07-24 | Discharge: 2017-07-24 | Disposition: A | Discharge status: Home / Self Care | Payer: BLUE CROSS/BLUE SHIELD | Source: Ambulatory Visit | Attending: Surgery | Admitting: Surgery

## 2017-07-24 ENCOUNTER — Encounter: Payer: Self-pay | Admitting: Surgery

## 2017-07-24 ENCOUNTER — Other Ambulatory Visit: Payer: Self-pay | Admitting: Surgery

## 2017-07-24 ENCOUNTER — Other Ambulatory Visit: Payer: Self-pay

## 2017-07-24 VITALS — BP 161/84 | HR 89 | Temp 97.1°F | Resp 20 | Ht 67.0 in | Wt 248.0 lb

## 2017-07-24 DIAGNOSIS — E1151 Type 2 diabetes mellitus with diabetic peripheral angiopathy without gangrene: Secondary | ICD-10-CM | POA: Insufficient documentation

## 2017-07-24 DIAGNOSIS — I739 Peripheral vascular disease, unspecified: Secondary | ICD-10-CM | POA: Diagnosis not present

## 2017-07-24 DIAGNOSIS — I10 Essential (primary) hypertension: Secondary | ICD-10-CM | POA: Insufficient documentation

## 2017-07-24 DIAGNOSIS — F17200 Nicotine dependence, unspecified, uncomplicated: Secondary | ICD-10-CM | POA: Insufficient documentation

## 2017-07-24 DIAGNOSIS — E785 Hyperlipidemia, unspecified: Secondary | ICD-10-CM | POA: Diagnosis not present

## 2017-07-24 DIAGNOSIS — I7025 Atherosclerosis of native arteries of other extremities with ulceration: Secondary | ICD-10-CM | POA: Diagnosis not present

## 2017-07-24 DIAGNOSIS — L89610 Pressure ulcer of right heel, unstageable: Secondary | ICD-10-CM | POA: Diagnosis not present

## 2017-07-24 DIAGNOSIS — I6522 Occlusion and stenosis of left carotid artery: Secondary | ICD-10-CM

## 2017-07-24 DIAGNOSIS — Z95828 Presence of other vascular implants and grafts: Secondary | ICD-10-CM | POA: Diagnosis not present

## 2017-07-24 DIAGNOSIS — I70235 Atherosclerosis of native arteries of right leg with ulceration of other part of foot: Secondary | ICD-10-CM | POA: Diagnosis not present

## 2017-07-24 DIAGNOSIS — I70234 Atherosclerosis of native arteries of right leg with ulceration of heel and midfoot: Secondary | ICD-10-CM | POA: Diagnosis not present

## 2017-07-24 DIAGNOSIS — L98499 Non-pressure chronic ulcer of skin of other sites with unspecified severity: Secondary | ICD-10-CM | POA: Diagnosis not present

## 2017-07-24 DIAGNOSIS — L97812 Non-pressure chronic ulcer of other part of right lower leg with fat layer exposed: Secondary | ICD-10-CM | POA: Diagnosis not present

## 2017-07-24 NOTE — Progress Notes (Signed)
POST OPERATIVE OFFICE NOTE    CC:  F/u for surgery  HPI:  This is a 62 y.o. female who is s/p left CEA 06/30/2017.  She is here for a post op visit.  She [name] [name] fever, chills, aphasia, amaurosis or weakness in her extremities.  Preoperatively her carotid stenosis was found secondary to bruit on exam.  S/P right fem-pop bypass 04/07/2017 secondary to ischemic foot changes.    She states that she is able to walk without symptoms of claudication.  Her below popliteal incision dehisced post op and is being treated by the wound center.  It is slowly healing as well as her toe wound.  A few days ago she admits to accidentally hitting her toes on the bed post and it is more tender today.    Allergies  Allergen Reactions  . Iohexol Rash    [name] [name] states she had a rash 35-40 yrs ago during a procedure.  Benadryl was given and [name] [name] was fine in 30 mins. She has had multiple CT's since with premeds and has done fine.  No anaphylaxis per [name] [name]. I updated this record. [name], RTRCT  03/06/17  . Penicillins Rash and Other (See Comments)    PATIENT HAS HAD A PCN REACTION WITH IMMEDIATE RASH, FACIAL/TONGUE/THROAT SWELLING, SOB, OR LIGHTHEADEDNESS WITH HYPOTENSION:  #  #  YES  #  #  Has patient had a PCN reaction causing severe rash involving mucus membranes or skin necrosis: No Has patient had a PCN reaction that required hospitalization: No Has patient had a PCN reaction occurring within the last 10 years: No Can take amoxicillin     . Morphine And Related Nausea And Vomiting    Makes her crawl out of her skin  . Sulfa Antibiotics Rash    Current Outpatient Medications  Medication Sig Dispense Refill  . albuterol (PROVENTIL HFA;VENTOLIN HFA) 108 (90 BASE) MCG/ACT inhaler Inhale 2 puffs into the lungs every 6 (six) hours as needed for wheezing.    Marland Kitchen aspirin 81 MG EC tablet Take 81 mg by mouth daily.      Marland Kitchen atorvastatin (LIPITOR) 20 MG tablet Take 1 tablet (20 mg total) by mouth at bedtime. (Patient taking  differently: Take 20 mg by mouth daily. ) 90 tablet 3  . CHANTIX CONTINUING MONTH PAK 1 MG tablet Take 1 mg by mouth 2 (two) times daily.  0  . CHANTIX STARTING MONTH PAK 0.5 MG X 11 & 1 MG X 42 tablet Take 0.5-1 mg by mouth daily.  1  . Cholecalciferol (VITAMIN D3) 1000 UNITS CAPS Take 1,000 Units by mouth 2 (two) times daily.     . clobetasol ointment (TEMOVATE) 7.25 % Apply 1 application topically 2 (two) times daily.  5  . folic acid (FOLVITE) 366 MCG tablet Take 800 mcg by mouth daily.     Marland Kitchen gabapentin (NEURONTIN) 300 MG capsule Take 1 capsule (300 mg total) by mouth 3 (three) times daily. 90 capsule 1  . hydrochlorothiazide (HYDRODIURIL) 25 MG tablet TAKE 1 TABLET DAILY 30 tablet 2  . insulin NPH-regular Human (NOVOLIN 70/30) (70-30) 100 UNIT/ML injection Inject 40 Units into the skin 3 (three) times daily.    Marland Kitchen lisinopril (PRINIVIL,ZESTRIL) 5 MG tablet Take 5 mg by mouth daily.    . sertraline (ZOLOFT) 100 MG tablet Take 100 mg by mouth once daily 180 tablet 1   No current facility-administered medications for this visit.      ROS:  See HPI  Physical Exam:  There were no vitals filed for this visit.  Incision:  Left CEA neck incision is healing well without hematoma, erythema or drainage.   Grip 5/5, no tongue deviation and smile is symmetric. Moving all 4 extremities equally.    Right LE incision are healed except for the below knee pop incision.  It has healed about 40%.  No erythema or drainage surrounding the incision.   Palpable DP pulse right LE. Heart RRR Lungs CTA, non labored breathing Abdomen:  Soft NTTP  LE Duplex:  07/24/2017 Revealed that the bypass is occluded.  Assessment/Plan:  This is a 62 y.o. female who is s/p: Left CEA Post op she was having swallowing difficulties.  She felt like the food took longer to "go down", she states this problem has resolved at this point.    S/P right Fem-pop by pass that is now occluded. She will continue going to the wound  center for incisional dehiscence and right toe debridement.  We will see her back in 3 weeks to check the second toe healing.   We have encouraged her to stop smoking, and to walk as much as she tolerates.   He options are to watch and wait verse angiogram followed by re-do bypass with propaten graft.  Dr. Trula Slade discussed this with her today.    F/U in 9 months for repeat carotid duplex.  Right carotid stenosis was 50% per duplex and CTA.     Roxy Horseman PA-C [facility] [phone]  Clinic MD:  Trula Slade  I agree with the above.  I have seen and evaluated the patient.  She is recovering nicely from her carotid endarterectomy.  She is being followed at the wound center for a wound on her right second toe.  She is status post right femoral-popliteal bypass graft in January.  Operative findings included a scarred and vein that did dilate nicely.  I had her duplex today and this shows that her bypass graft is occluded.  I suspect this is secondary to the caliber and quality of her vein.  We discussed options today.  Because her leg has improved over time with wound care, we will continue with local wound care to see if this can take care of itself.  If not, I will repeat her arteriogram with plans for prosthetic bypass graft.  She is given a follow-up in 3 weeks.  [name]

## 2017-07-31 DIAGNOSIS — L97512 Non-pressure chronic ulcer of other part of right foot with fat layer exposed: Secondary | ICD-10-CM | POA: Diagnosis not present

## 2017-07-31 DIAGNOSIS — I70235 Atherosclerosis of native arteries of right leg with ulceration of other part of foot: Secondary | ICD-10-CM | POA: Diagnosis not present

## 2017-07-31 DIAGNOSIS — T8131XD Disruption of external operation (surgical) wound, not elsewhere classified, subsequent encounter: Secondary | ICD-10-CM | POA: Diagnosis not present

## 2017-07-31 DIAGNOSIS — I70234 Atherosclerosis of native arteries of right leg with ulceration of heel and midfoot: Secondary | ICD-10-CM | POA: Diagnosis not present

## 2017-07-31 DIAGNOSIS — L97812 Non-pressure chronic ulcer of other part of right lower leg with fat layer exposed: Secondary | ICD-10-CM | POA: Diagnosis not present

## 2017-08-08 DIAGNOSIS — L97112 Non-pressure chronic ulcer of right thigh with fat layer exposed: Secondary | ICD-10-CM | POA: Diagnosis not present

## 2017-08-17 DIAGNOSIS — I70235 Atherosclerosis of native arteries of right leg with ulceration of other part of foot: Secondary | ICD-10-CM | POA: Diagnosis not present

## 2017-08-17 DIAGNOSIS — L89610 Pressure ulcer of right heel, unstageable: Secondary | ICD-10-CM | POA: Diagnosis not present

## 2017-08-17 DIAGNOSIS — L97112 Non-pressure chronic ulcer of right thigh with fat layer exposed: Secondary | ICD-10-CM | POA: Diagnosis not present

## 2017-08-17 DIAGNOSIS — L97512 Non-pressure chronic ulcer of other part of right foot with fat layer exposed: Secondary | ICD-10-CM | POA: Diagnosis not present

## 2017-08-21 ENCOUNTER — Encounter: Payer: Self-pay | Admitting: Surgery

## 2017-08-21 ENCOUNTER — Ambulatory Visit (INDEPENDENT_AMBULATORY_CARE_PROVIDER_SITE_OTHER): Payer: BLUE CROSS/BLUE SHIELD | Admitting: Surgery

## 2017-08-21 VITALS — BP 131/65 | HR 92 | Temp 97.1°F | Resp 18 | Ht 67.0 in | Wt 246.1 lb

## 2017-08-21 DIAGNOSIS — I7025 Atherosclerosis of native arteries of other extremities with ulceration: Secondary | ICD-10-CM

## 2017-08-21 NOTE — Progress Notes (Signed)
[facility] [place]  Patient name: [name] [name] MRN: [id number] DOB: [birth year] Sex: female   REASON FOR VISIT:    Follow up  [place] ILLNESS:    [name] [name] is a 62 y.o. female who is back today for follow-up.  She underwent right femoral-popliteal bypass graft on 04/07/2017 for ischemic changes to the right leg.  There was an inflammatory reaction around the vein was harvested however it dilated up nicely.  Unfortunately at her last visit the ultrasound showed that the bypass graft was occluded.  She has been able to nearly healed all of her wounds and only has a dry eschar on the tip of her right second toe.  I brought her back today to check on the appearance of her toe, given the unexpected ultrasound findings last month of occluded bypass.  She is also status post left carotid endarterectomy for asymptomatic stenosis on 06/30/2017.  Intraoperative findings included a 95% stenosis.   PAST MEDICAL HISTORY:   Past Medical History:  Diagnosis Date  . Abdominal abscess   . Allergic rhinitis   . Asthma   . Bacteremia 03/2017  . Carotid stenosis, left 04/2009   50-69%  . Colon polyps 12/05  . COPD (chronic obstructive pulmonary disease) ([place])   . Depression   . Diabetes mellitus    followed by Dr. Chalmers Cater  . Folate deficiency   . Hyperlipidemia   . Hypertension   . Lumbar disc disease    h/o hnp with repair  . Obesity, morbid (more than 100 lbs over ideal weight or BMI > 40) (HCC)   . Psoriasis   . Thrombocytopenia ([place])    work up with Dr. Griffith Citron neg in 2000  . Tobacco abuse      FAMILY HISTORY:   Family History  Problem Relation Age of Onset  . Asthma Mother   . Hypertension Mother   . Stroke Father   . Alcohol abuse Father   . Cancer Sister        brain stem tumor  . Suicidality Sister   . Cancer Sister        breast  . Suicidality Brother   . Dementia Sister     SOCIAL HISTORY:    Social History   Tobacco Use  . Smoking status: Current Some Day Smoker    Packs/day: 0.50    Years: 45.00    Pack years: 22.50    Types: Cigarettes  . Smokeless tobacco: Never Used  . Tobacco comment: 1 pk lasts 3-4 days. to start Chantix after surgery  Substance Use Topics  . Alcohol use: No     ALLERGIES:   Allergies  Allergen Reactions  . Iohexol Rash    Pt states she had a rash 35-40 yrs ago during a procedure.  Benadryl was given and pt was fine in 30 mins. She has had multiple CT's since with premeds and has done fine.  No anaphylaxis per pt. I updated this record. [name], RTRCT  03/06/17  . Penicillins Rash and Other (See Comments)    PATIENT HAS HAD A PCN REACTION WITH IMMEDIATE RASH, FACIAL/TONGUE/THROAT SWELLING, SOB, OR LIGHTHEADEDNESS WITH HYPOTENSION:  #  #  YES  #  #  Has patient had a PCN reaction causing severe rash involving mucus membranes or skin necrosis: No Has patient had a PCN reaction that required hospitalization: No Has patient had a PCN reaction occurring within the last 10 years: No Can take  amoxicillin     . Morphine And Related Nausea And Vomiting    Makes her crawl out of her skin  . Sulfa Antibiotics Rash     CURRENT MEDICATIONS:   Current Outpatient Medications  Medication Sig Dispense Refill  . albuterol (PROVENTIL HFA;VENTOLIN HFA) 108 (90 BASE) MCG/ACT inhaler Inhale 2 puffs into the lungs every 6 (six) hours as needed for wheezing.    Marland Kitchen aspirin 81 MG EC tablet Take 81 mg by mouth daily.      Marland Kitchen atorvastatin (LIPITOR) 20 MG tablet Take 1 tablet (20 mg total) by mouth at bedtime. (Patient taking differently: Take 20 mg by mouth daily. ) 90 tablet 3  . CHANTIX CONTINUING MONTH PAK 1 MG tablet Take 1 mg by mouth 2 (two) times daily.  0  . CHANTIX STARTING MONTH PAK 0.5 MG X 11 & 1 MG X 42 tablet Take 0.5-1 mg by mouth daily.  1  . Cholecalciferol (VITAMIN D3) 1000 UNITS CAPS Take 1,000 Units by mouth 2 (two) times daily.     .  clobetasol ointment (TEMOVATE) 1.09 % Apply 1 application topically 2 (two) times daily.  5  . folic acid (FOLVITE) 323 MCG tablet Take 800 mcg by mouth daily.     Marland Kitchen gabapentin (NEURONTIN) 300 MG capsule Take 1 capsule (300 mg total) by mouth 3 (three) times daily. 90 capsule 1  . hydrochlorothiazide (HYDRODIURIL) 25 MG tablet TAKE 1 TABLET DAILY 30 tablet 2  . insulin NPH-regular Human (NOVOLIN 70/30) (70-30) 100 UNIT/ML injection Inject 40 Units into the skin 3 (three) times daily.    Marland Kitchen lisinopril (PRINIVIL,ZESTRIL) 5 MG tablet Take 5 mg by mouth daily.    . sertraline (ZOLOFT) 100 MG tablet Take 100 mg by mouth once daily 180 tablet 1   No current facility-administered medications for this visit.     REVIEW OF SYSTEMS:   [X]  denotes positive finding, [ ]  denotes negative finding Cardiac  Comments:  Chest pain or chest pressure:    Shortness of breath upon exertion:    Short of breath when lying flat:    Irregular heart rhythm:        Vascular    Pain in calf, thigh, or hip brought on by ambulation:    Pain in feet at night that wakes you up from your sleep:     Blood clot in your veins:    Leg swelling:         Pulmonary    Oxygen at home:    Productive cough:     Wheezing:         Neurologic    Sudden weakness in arms or legs:     Sudden numbness in arms or legs:     Sudden onset of difficulty speaking or slurred speech:    Temporary loss of vision in one eye:     Problems with dizziness:         Gastrointestinal    Blood in stool:     Vomited blood:         Genitourinary    Burning when urinating:     Blood in urine:        Psychiatric    Major depression:         Hematologic    Bleeding problems:    Problems with blood clotting too easily:        Skin    Rashes or ulcers:        Constitutional  Fever or chills:      PHYSICAL EXAM:   Vitals:   08/21/17 1435 08/21/17 1438  BP: 137/78 131/65  Pulse: 92   Resp: 18   Temp: (!) 97.1 F (36.2 C)     TempSrc: Oral   SpO2: 94%   Weight: 246 lb 1.6 oz (111.6 kg)   Height: 5\' 7"  (1.702 m)     GENERAL: The patient is a well-nourished female, in no acute distress. The vital signs are documented above. CARDIAC: There is a regular rate and rhythm.  VASCULAR: Nonpalpable pedal pulse on the right PULMONARY: Non-labored respirations ABDOMEN: Soft and non-tender with normal pitched bowel sounds.  MUSCULOSKELETAL: There are no major deformities or cyanosis. NEUROLOGIC: No focal weakness or paresthesias are detected. SKIN: Small eschar at the tip of the right second toe PSYCHIATRIC: The patient has a normal affect.  STUDIES:   None  MEDICAL ISSUES:   Carotid: The patient is scheduled for follow-up in 6 months with repeat duplex  Lower extremity arterial insufficiency: The patient's bypass graft is known to be occluded.  Fortunately, she does not have any open wounds and only has a remnant of a dry eschar on the tip of her right second toe which should auto amputate with time.  No intervention is recommended at this time.  The patient knows to contact me if she develops any changes.  I am giving her clearance to return to work.    [name], MD [facility] [phone] Pager [phone]

## 2017-08-25 ENCOUNTER — Encounter: Payer: Self-pay | Admitting: Surgery

## 2017-09-11 DIAGNOSIS — L97112 Non-pressure chronic ulcer of right thigh with fat layer exposed: Secondary | ICD-10-CM | POA: Diagnosis not present

## 2017-09-11 DIAGNOSIS — L89610 Pressure ulcer of right heel, unstageable: Secondary | ICD-10-CM | POA: Diagnosis not present

## 2017-09-11 DIAGNOSIS — L97812 Non-pressure chronic ulcer of other part of right lower leg with fat layer exposed: Secondary | ICD-10-CM | POA: Diagnosis not present

## 2017-09-11 DIAGNOSIS — I70235 Atherosclerosis of native arteries of right leg with ulceration of other part of foot: Secondary | ICD-10-CM | POA: Diagnosis not present

## 2017-10-02 DIAGNOSIS — L97512 Non-pressure chronic ulcer of other part of right foot with fat layer exposed: Secondary | ICD-10-CM | POA: Diagnosis not present

## 2017-10-02 DIAGNOSIS — L97112 Non-pressure chronic ulcer of right thigh with fat layer exposed: Secondary | ICD-10-CM | POA: Diagnosis not present

## 2017-10-02 DIAGNOSIS — I70235 Atherosclerosis of native arteries of right leg with ulceration of other part of foot: Secondary | ICD-10-CM | POA: Diagnosis not present

## 2017-10-02 DIAGNOSIS — L89610 Pressure ulcer of right heel, unstageable: Secondary | ICD-10-CM | POA: Diagnosis not present

## 2017-12-11 DIAGNOSIS — E049 Nontoxic goiter, unspecified: Secondary | ICD-10-CM | POA: Diagnosis not present

## 2017-12-11 DIAGNOSIS — E1165 Type 2 diabetes mellitus with hyperglycemia: Secondary | ICD-10-CM | POA: Diagnosis not present

## 2017-12-11 DIAGNOSIS — I10 Essential (primary) hypertension: Secondary | ICD-10-CM | POA: Diagnosis not present

## 2017-12-11 DIAGNOSIS — E7800 Pure hypercholesterolemia, unspecified: Secondary | ICD-10-CM | POA: Diagnosis not present

## 2018-02-19 ENCOUNTER — Encounter (HOSPITAL_COMMUNITY): Payer: BLUE CROSS/BLUE SHIELD

## 2018-02-19 ENCOUNTER — Ambulatory Visit: Payer: BLUE CROSS/BLUE SHIELD | Admitting: Surgery

## 2018-05-22 DIAGNOSIS — E1165 Type 2 diabetes mellitus with hyperglycemia: Secondary | ICD-10-CM | POA: Diagnosis not present

## 2018-05-22 DIAGNOSIS — E7800 Pure hypercholesterolemia, unspecified: Secondary | ICD-10-CM | POA: Diagnosis not present

## 2018-05-22 DIAGNOSIS — I10 Essential (primary) hypertension: Secondary | ICD-10-CM | POA: Diagnosis not present

## 2018-05-29 DIAGNOSIS — I10 Essential (primary) hypertension: Secondary | ICD-10-CM | POA: Diagnosis not present

## 2018-05-29 DIAGNOSIS — E049 Nontoxic goiter, unspecified: Secondary | ICD-10-CM | POA: Diagnosis not present

## 2018-05-29 DIAGNOSIS — E7800 Pure hypercholesterolemia, unspecified: Secondary | ICD-10-CM | POA: Diagnosis not present

## 2018-05-29 DIAGNOSIS — E1165 Type 2 diabetes mellitus with hyperglycemia: Secondary | ICD-10-CM | POA: Diagnosis not present

## 2019-10-15 ENCOUNTER — Other Ambulatory Visit: Payer: Self-pay

## 2019-10-15 DIAGNOSIS — I6522 Occlusion and stenosis of left carotid artery: Secondary | ICD-10-CM

## 2019-10-15 DIAGNOSIS — I739 Peripheral vascular disease, unspecified: Secondary | ICD-10-CM

## 2019-10-28 ENCOUNTER — Other Ambulatory Visit: Payer: Self-pay

## 2019-10-28 ENCOUNTER — Ambulatory Visit (INDEPENDENT_AMBULATORY_CARE_PROVIDER_SITE_OTHER): Payer: Medicare Other | Admitting: Surgery

## 2019-10-28 ENCOUNTER — Ambulatory Visit (INDEPENDENT_AMBULATORY_CARE_PROVIDER_SITE_OTHER)
Admission: RE | Admit: 2019-10-28 | Discharge: 2019-10-28 | Disposition: A | Discharge status: Home / Self Care | Payer: Medicare Other | Source: Ambulatory Visit | Attending: Surgery | Admitting: Surgery

## 2019-10-28 ENCOUNTER — Encounter: Payer: Self-pay | Admitting: Surgery

## 2019-10-28 VITALS — BP 151/83 | HR 89 | Temp 97.8°F | Resp 20 | Ht 67.0 in | Wt 259.0 lb

## 2019-10-28 DIAGNOSIS — I6522 Occlusion and stenosis of left carotid artery: Secondary | ICD-10-CM

## 2019-10-28 DIAGNOSIS — I7025 Atherosclerosis of native arteries of other extremities with ulceration: Secondary | ICD-10-CM

## 2019-10-28 DIAGNOSIS — I739 Peripheral vascular disease, unspecified: Secondary | ICD-10-CM

## 2019-10-28 DIAGNOSIS — I6523 Occlusion and stenosis of bilateral carotid arteries: Secondary | ICD-10-CM | POA: Diagnosis not present

## 2019-10-28 NOTE — H&P (View-Only) (Signed)
[facility] [place]  Patient name: [name] [name] MRN: [id number] DOB: 1955/04/06 Sex: female   REASON FOR VISIT:    Follow up  [place] ILLNESS:    [name] [name] is a 64 y.o. female who is back today for follow-up.  She underwent right femoral-popliteal bypass graft on 04/07/2017 for ischemic changes to the right leg.  There was an inflammatory reaction around the vein was harvested however it dilated up nicely.  Unfortunately at her last visit the ultrasound showed that the bypass graft was occluded.  Her wounds have healed and so we elected to monitor this.  Now on the left side for about 3 months she has had worsening ulceration.   She has a history of a left carotid endarterectomy on 06/30/2017 for asymptomatic stenosis.  She is asymptomatic.  She denies numbness or weakness in either extremity.  She denies slurred speech.  She denies amaurosis fugax.  She takes a statin for hypercholesterolemia.  She is on ACE inhibitor for hypertension.  She is a diabetic.  She continues to smoke.   PAST MEDICAL HISTORY:   Past Medical History:  Diagnosis Date  . Abdominal abscess   . Allergic rhinitis   . Asthma   . Bacteremia 03/2017  . Carotid stenosis, left 04/2009   50-69%  . Colon polyps 12/05  . COPD (chronic obstructive pulmonary disease) ([place])   . Depression   . Diabetes mellitus    followed by Dr. Chalmers Cater  . Folate deficiency   . Hyperlipidemia   . Hypertension   . Lumbar disc disease    h/o hnp with repair  . Obesity, morbid (more than 100 lbs over ideal weight or BMI > 40) (HCC)   . Peripheral vascular disease ([place])   . Psoriasis   . Thrombocytopenia ([place])    work up with Dr. Griffith Citron neg in 2000  . Tobacco abuse      FAMILY HISTORY:   Family History  Problem Relation Age of Onset  . Asthma Mother   . Hypertension Mother   . Stroke Father   . Alcohol abuse Father   . Cancer Sister         brain stem tumor  . Suicidality Sister   . Cancer Sister        breast  . Suicidality Brother   . Dementia Sister     SOCIAL HISTORY:   Social History   Tobacco Use  . Smoking status: Current Some Day Smoker    Packs/day: 0.50    Years: 45.00    Pack years: 22.50    Types: Cigarettes  . Smokeless tobacco: Never Used  . Tobacco comment: 1 pk lasts 3-4 days. to start Chantix after surgery  Substance Use Topics  . Alcohol use: No     ALLERGIES:   Allergies  Allergen Reactions  . Iohexol Rash    Pt states she had a rash 35-40 yrs ago during a procedure.  Benadryl was given and pt was fine in 30 mins. She has had multiple CT's since with premeds and has done fine.  No anaphylaxis per pt. I updated this record. [name], RTRCT  03/06/17  . Penicillins Rash and Other (See Comments)    PATIENT HAS HAD A PCN REACTION WITH IMMEDIATE RASH, FACIAL/TONGUE/THROAT SWELLING, SOB, OR LIGHTHEADEDNESS WITH HYPOTENSION:  #  #  YES  #  #  Has patient had a PCN reaction causing severe rash involving mucus membranes or skin  necrosis: No Has patient had a PCN reaction that required hospitalization: No Has patient had a PCN reaction occurring within the last 10 years: No Can take amoxicillin     . Morphine And Related Nausea And Vomiting    Makes her crawl out of her skin  . Sulfa Antibiotics Rash     CURRENT MEDICATIONS:   Current Outpatient Medications  Medication Sig Dispense Refill  . albuterol (PROVENTIL HFA;VENTOLIN HFA) 108 (90 BASE) MCG/ACT inhaler Inhale 2 puffs into the lungs every 6 (six) hours as needed for wheezing.    Marland Kitchen aspirin 81 MG EC tablet Take 81 mg by mouth daily.      Marland Kitchen atorvastatin (LIPITOR) 20 MG tablet Take 1 tablet (20 mg total) by mouth at bedtime. (Patient taking differently: Take 20 mg by mouth daily. ) 90 tablet 3  . Cholecalciferol (VITAMIN D3) 1000 UNITS CAPS Take 1,000 Units by mouth 2 (two) times daily.     . clobetasol ointment (TEMOVATE) 1.96 % Apply  1 application topically 2 (two) times daily.  5  . FLOVENT HFA 220 MCG/ACT inhaler SMARTSIG:By Mouth    . folic acid (FOLVITE) 222 MCG tablet Take 800 mcg by mouth daily.     Marland Kitchen gabapentin (NEURONTIN) 300 MG capsule Take 1 capsule (300 mg total) by mouth 3 (three) times daily. 90 capsule 1  . hydrochlorothiazide (HYDRODIURIL) 25 MG tablet TAKE 1 TABLET DAILY 30 tablet 2  . insulin NPH-regular Human (NOVOLIN 70/30) (70-30) 100 UNIT/ML injection Inject 40 Units into the skin 3 (three) times daily.    Marland Kitchen lisinopril (ZESTRIL) 10 MG tablet Take 10 mg by mouth 2 (two) times daily.    . metFORMIN (GLUCOPHAGE-XR) 500 MG 24 hr tablet Take 500 mg by mouth 2 (two) times daily.    . pregabalin (LYRICA) 150 MG capsule Take 150 mg by mouth 3 (three) times daily.    . sertraline (ZOLOFT) 100 MG tablet Take 100 mg by mouth once daily 180 tablet 1   No current facility-administered medications for this visit.    REVIEW OF SYSTEMS:   [X]  denotes positive finding, [ ]  denotes negative finding Cardiac  Comments:  Chest pain or chest pressure:    Shortness of breath upon exertion:    Short of breath when lying flat:    Irregular heart rhythm:        Vascular    Pain in calf, thigh, or hip brought on by ambulation: x   Pain in feet at night that wakes you up from your sleep:  x   Blood clot in your veins:    Leg swelling:  x       Pulmonary    Oxygen at home:    Productive cough:     Wheezing:         Neurologic    Sudden weakness in arms or legs:     Sudden numbness in arms or legs:     Sudden onset of difficulty speaking or slurred speech:    Temporary loss of vision in one eye:     Problems with dizziness:         Gastrointestinal    Blood in stool:     Vomited blood:         Genitourinary    Burning when urinating:     Blood in urine:        Psychiatric    Major depression:         Hematologic  Bleeding problems:    Problems with blood clotting too easily:        Skin    Rashes  or ulcers: xx       Constitutional    Fever or chills:      PHYSICAL EXAM:   Vitals:   10/28/19 1446  BP: (!) 151/83  Pulse: 89  Resp: 20  Temp: 97.8 F (36.6 C)  SpO2: 92%  Weight: 259 lb (117.5 kg)  Height: 5\' 7"  (1.702 m)    GENERAL: The patient is a well-nourished female, in no acute distress. The vital signs are documented above. CARDIAC: There is a regular rate and rhythm.  VASCULAR: Nonpalpable pedal pulses PULMONARY: Non-labored respirations ABDOMEN: Soft and non-tender with normal pitched bowel sounds.  MUSCULOSKELETAL: There are no major deformities or cyanosis. NEUROLOGIC: No focal weakness or paresthesias are detected. SKIN: See photo below  PSYCHIATRIC: The patient has a normal affect.    STUDIES:   I have reviewed the following studies: ABI/TBIToday's ABIToday's TBIPrevious ABIPrevious TBI  +-------+-----------+-----------+------------+------------+  Right 0.52    0.49    0.43            +-------+-----------+-----------+------------+------------+  Left  0.23    -     0.26    0.19 pre-op   +-------+-----------+-----------+------------+------------+   Right toe pressure: 89 Left toe pressure: Absent   Carotid:  Right Carotid: Velocities in the right ICA are consistent with a 80-99%         stenosis.   Left Carotid: The ECA appears >50% stenosed. Patent carotid endarterectomy  site        with no evidence for restenosis.   Vertebrals: Right vertebral artery demonstrates antegrade flow. Left  vertebral        artery demonstrates atypical antegrade flow.  Subclavians: Normal flow hemodynamics were seen in bilateral subclavian        arteries.  MEDICAL ISSUES:   Atherosclerotic lower extremity vascular disease with ulceration, left leg: I discussed with her that this is a limb threatening situation.  She needs to undergo angiography to see what her revascularization  options are.  I am going to do this tomorrow.  This will be through a right femoral approach with aortogram and bilateral runoff.  I will treat her percutaneously if indicated.  Carotid disease: The patient has a greater than 80% right carotid stenosis which is asymptomatic.  I told her that I would do with her leg first and then address her carotid.    Leia Alf, MD, FACS [facility] [phone] Pager [phone]

## 2019-10-28 NOTE — Progress Notes (Signed)
[facility] [place]  Patient name: [name] [name] MRN: [id number] DOB: 1955-06-13 Sex: female   REASON FOR VISIT:    Follow up  [place] ILLNESS:    [name] [name] is a 64 y.o. female who is back today for follow-up.  She underwent right femoral-popliteal bypass graft on 04/07/2017 for ischemic changes to the right leg.  There was an inflammatory reaction around the vein was harvested however it dilated up nicely.  Unfortunately at her last visit the ultrasound showed that the bypass graft was occluded.  Her wounds have healed and so we elected to monitor this.  Now on the left side for about 3 months she has had worsening ulceration.   She has a history of a left carotid endarterectomy on 06/30/2017 for asymptomatic stenosis.  She is asymptomatic.  She denies numbness or weakness in either extremity.  She denies slurred speech.  She denies amaurosis fugax.  She takes a statin for hypercholesterolemia.  She is on ACE inhibitor for hypertension.  She is a diabetic.  She continues to smoke.   PAST MEDICAL HISTORY:   Past Medical History:  Diagnosis Date  . Abdominal abscess   . Allergic rhinitis   . Asthma   . Bacteremia 03/2017  . Carotid stenosis, left 04/2009   50-69%  . Colon polyps 12/05  . COPD (chronic obstructive pulmonary disease) ([place])   . Depression   . Diabetes mellitus    followed by Dr. Chalmers Cater  . Folate deficiency   . Hyperlipidemia   . Hypertension   . Lumbar disc disease    h/o hnp with repair  . Obesity, morbid (more than 100 lbs over ideal weight or BMI > 40) (HCC)   . Peripheral vascular disease ([place])   . Psoriasis   . Thrombocytopenia ([place])    work up with Dr. Griffith Citron neg in 2000  . Tobacco abuse      FAMILY HISTORY:   Family History  Problem Relation Age of Onset  . Asthma Mother   . Hypertension Mother   . Stroke Father   . Alcohol abuse Father   . Cancer Sister         brain stem tumor  . Suicidality Sister   . Cancer Sister        breast  . Suicidality Brother   . Dementia Sister     SOCIAL HISTORY:   Social History   Tobacco Use  . Smoking status: Current Some Day Smoker    Packs/day: 0.50    Years: 45.00    Pack years: 22.50    Types: Cigarettes  . Smokeless tobacco: Never Used  . Tobacco comment: 1 pk lasts 3-4 days. to start Chantix after surgery  Substance Use Topics  . Alcohol use: No     ALLERGIES:   Allergies  Allergen Reactions  . Iohexol Rash    Pt states she had a rash 35-40 yrs ago during a procedure.  Benadryl was given and pt was fine in 30 mins. She has had multiple CT's since with premeds and has done fine.  No anaphylaxis per pt. I updated this record. [name], RTRCT  03/06/17  . Penicillins Rash and Other (See Comments)    PATIENT HAS HAD A PCN REACTION WITH IMMEDIATE RASH, FACIAL/TONGUE/THROAT SWELLING, SOB, OR LIGHTHEADEDNESS WITH HYPOTENSION:  #  #  YES  #  #  Has patient had a PCN reaction causing severe rash involving mucus membranes or skin  necrosis: No Has patient had a PCN reaction that required hospitalization: No Has patient had a PCN reaction occurring within the last 10 years: No Can take amoxicillin     . Morphine And Related Nausea And Vomiting    Makes her crawl out of her skin  . Sulfa Antibiotics Rash     CURRENT MEDICATIONS:   Current Outpatient Medications  Medication Sig Dispense Refill  . albuterol (PROVENTIL HFA;VENTOLIN HFA) 108 (90 BASE) MCG/ACT inhaler Inhale 2 puffs into the lungs every 6 (six) hours as needed for wheezing.    Marland Kitchen aspirin 81 MG EC tablet Take 81 mg by mouth daily.      Marland Kitchen atorvastatin (LIPITOR) 20 MG tablet Take 1 tablet (20 mg total) by mouth at bedtime. (Patient taking differently: Take 20 mg by mouth daily. ) 90 tablet 3  . Cholecalciferol (VITAMIN D3) 1000 UNITS CAPS Take 1,000 Units by mouth 2 (two) times daily.     . clobetasol ointment (TEMOVATE) 7.62 % Apply  1 application topically 2 (two) times daily.  5  . FLOVENT HFA 220 MCG/ACT inhaler SMARTSIG:By Mouth    . folic acid (FOLVITE) 831 MCG tablet Take 800 mcg by mouth daily.     Marland Kitchen gabapentin (NEURONTIN) 300 MG capsule Take 1 capsule (300 mg total) by mouth 3 (three) times daily. 90 capsule 1  . hydrochlorothiazide (HYDRODIURIL) 25 MG tablet TAKE 1 TABLET DAILY 30 tablet 2  . insulin NPH-regular Human (NOVOLIN 70/30) (70-30) 100 UNIT/ML injection Inject 40 Units into the skin 3 (three) times daily.    Marland Kitchen lisinopril (ZESTRIL) 10 MG tablet Take 10 mg by mouth 2 (two) times daily.    . metFORMIN (GLUCOPHAGE-XR) 500 MG 24 hr tablet Take 500 mg by mouth 2 (two) times daily.    . pregabalin (LYRICA) 150 MG capsule Take 150 mg by mouth 3 (three) times daily.    . sertraline (ZOLOFT) 100 MG tablet Take 100 mg by mouth once daily 180 tablet 1   No current facility-administered medications for this visit.    REVIEW OF SYSTEMS:   [X]  denotes positive finding, [ ]  denotes negative finding Cardiac  Comments:  Chest pain or chest pressure:    Shortness of breath upon exertion:    Short of breath when lying flat:    Irregular heart rhythm:        Vascular    Pain in calf, thigh, or hip brought on by ambulation: x   Pain in feet at night that wakes you up from your sleep:  x   Blood clot in your veins:    Leg swelling:  x       Pulmonary    Oxygen at home:    Productive cough:     Wheezing:         Neurologic    Sudden weakness in arms or legs:     Sudden numbness in arms or legs:     Sudden onset of difficulty speaking or slurred speech:    Temporary loss of vision in one eye:     Problems with dizziness:         Gastrointestinal    Blood in stool:     Vomited blood:         Genitourinary    Burning when urinating:     Blood in urine:        Psychiatric    Major depression:         Hematologic  Bleeding problems:    Problems with blood clotting too easily:        Skin    Rashes  or ulcers: xx       Constitutional    Fever or chills:      PHYSICAL EXAM:   Vitals:   10/28/19 1446  BP: (!) 151/83  Pulse: 89  Resp: 20  Temp: 97.8 F (36.6 C)  SpO2: 92%  Weight: 259 lb (117.5 kg)  Height: 5\' 7"  (1.702 m)    GENERAL: The patient is a well-nourished female, in no acute distress. The vital signs are documented above. CARDIAC: There is a regular rate and rhythm.  VASCULAR: Nonpalpable pedal pulses PULMONARY: Non-labored respirations ABDOMEN: Soft and non-tender with normal pitched bowel sounds.  MUSCULOSKELETAL: There are no major deformities or cyanosis. NEUROLOGIC: No focal weakness or paresthesias are detected. SKIN: See photo below  PSYCHIATRIC: The patient has a normal affect.    STUDIES:   I have reviewed the following studies: ABI/TBIToday's ABIToday's TBIPrevious ABIPrevious TBI  +-------+-----------+-----------+------------+------------+  Right 0.52    0.49    0.43            +-------+-----------+-----------+------------+------------+  Left  0.23    -     0.26    0.19 pre-op   +-------+-----------+-----------+------------+------------+   Right toe pressure: 89 Left toe pressure: Absent   Carotid:  Right Carotid: Velocities in the right ICA are consistent with a 80-99%         stenosis.   Left Carotid: The ECA appears >50% stenosed. Patent carotid endarterectomy  site        with no evidence for restenosis.   Vertebrals: Right vertebral artery demonstrates antegrade flow. Left  vertebral        artery demonstrates atypical antegrade flow.  Subclavians: Normal flow hemodynamics were seen in bilateral subclavian        arteries.  MEDICAL ISSUES:   Atherosclerotic lower extremity vascular disease with ulceration, left leg: I discussed with her that this is a limb threatening situation.  She needs to undergo angiography to see what her revascularization  options are.  I am going to do this tomorrow.  This will be through a right femoral approach with aortogram and bilateral runoff.  I will treat her percutaneously if indicated.  Carotid disease: The patient has a greater than 80% right carotid stenosis which is asymptomatic.  I told her that I would do with her leg first and then address her carotid.    Leia Alf, MD, FACS [facility] [phone] Pager [phone]

## 2019-10-28 NOTE — H&P (View-Only) (Signed)
Vascular and Vein Specialist of   Patient name: [name] [name] MRN: [id number] DOB: 12-01-1955 Sex: female   REASON FOR VISIT:    Follow up  [place] ILLNESS:    [name] [name] is a 64 y.o. female who is back today for follow-up.  She underwent right femoral-popliteal bypass graft on 04/07/2017 for ischemic changes to the right leg.  There was an inflammatory reaction around the vein was harvested however it dilated up nicely.  Unfortunately at her last visit the ultrasound showed that the bypass graft was occluded.  Her wounds have healed and so we elected to monitor this.  Now on the left side for about 3 months she has had worsening ulceration.   She has a history of a left carotid endarterectomy on 06/30/2017 for asymptomatic stenosis.  She is asymptomatic.  She denies numbness or weakness in either extremity.  She denies slurred speech.  She denies amaurosis fugax.  She takes a statin for hypercholesterolemia.  She is on ACE inhibitor for hypertension.  She is a diabetic.  She continues to smoke.   PAST MEDICAL HISTORY:   Past Medical History:  Diagnosis Date  . Abdominal abscess   . Allergic rhinitis   . Asthma   . Bacteremia 03/2017  . Carotid stenosis, left 04/2009   50-69%  . Colon polyps 12/05  . COPD (chronic obstructive pulmonary disease) ([place])   . Depression   . Diabetes mellitus    followed by Dr. Chalmers Cater  . Folate deficiency   . Hyperlipidemia   . Hypertension   . Lumbar disc disease    h/o hnp with repair  . Obesity, morbid (more than 100 lbs over ideal weight or BMI > 40) (HCC)   . Peripheral vascular disease ([place])   . Psoriasis   . Thrombocytopenia ([place])    work up with Dr. Griffith Citron neg in 2000  . Tobacco abuse      FAMILY HISTORY:   Family History  Problem Relation Age of Onset  . Asthma Mother   . Hypertension Mother   . Stroke Father   . Alcohol abuse Father   . Cancer Sister         brain stem tumor  . Suicidality Sister   . Cancer Sister        breast  . Suicidality Brother   . Dementia Sister     SOCIAL HISTORY:   Social History   Tobacco Use  . Smoking status: Current Some Day Smoker    Packs/day: 0.50    Years: 45.00    Pack years: 22.50    Types: Cigarettes  . Smokeless tobacco: Never Used  . Tobacco comment: 1 pk lasts 3-4 days. to start Chantix after surgery  Substance Use Topics  . Alcohol use: No     ALLERGIES:   Allergies  Allergen Reactions  . Iohexol Rash    Pt states she had a rash 35-40 yrs ago during a procedure.  Benadryl was given and pt was fine in 30 mins. She has had multiple CT's since with premeds and has done fine.  No anaphylaxis per pt. I updated this record. [name], RTRCT  03/06/17  . Penicillins Rash and Other (See Comments)    PATIENT HAS HAD A PCN REACTION WITH IMMEDIATE RASH, FACIAL/TONGUE/THROAT SWELLING, SOB, OR LIGHTHEADEDNESS WITH HYPOTENSION:  #  #  YES  #  #  Has patient had a PCN reaction causing severe rash involving mucus membranes or skin  necrosis: No Has patient had a PCN reaction that required hospitalization: No Has patient had a PCN reaction occurring within the last 10 years: No Can take amoxicillin     . Morphine And Related Nausea And Vomiting    Makes her crawl out of her skin  . Sulfa Antibiotics Rash     CURRENT MEDICATIONS:   Current Outpatient Medications  Medication Sig Dispense Refill  . albuterol (PROVENTIL HFA;VENTOLIN HFA) 108 (90 BASE) MCG/ACT inhaler Inhale 2 puffs into the lungs every 6 (six) hours as needed for wheezing.    Marland Kitchen aspirin 81 MG EC tablet Take 81 mg by mouth daily.      Marland Kitchen atorvastatin (LIPITOR) 20 MG tablet Take 1 tablet (20 mg total) by mouth at bedtime. (Patient taking differently: Take 20 mg by mouth daily. ) 90 tablet 3  . Cholecalciferol (VITAMIN D3) 1000 UNITS CAPS Take 1,000 Units by mouth 2 (two) times daily.     . clobetasol ointment (TEMOVATE) 6.23 % Apply  1 application topically 2 (two) times daily.  5  . FLOVENT HFA 220 MCG/ACT inhaler SMARTSIG:By Mouth    . folic acid (FOLVITE) 762 MCG tablet Take 800 mcg by mouth daily.     Marland Kitchen gabapentin (NEURONTIN) 300 MG capsule Take 1 capsule (300 mg total) by mouth 3 (three) times daily. 90 capsule 1  . hydrochlorothiazide (HYDRODIURIL) 25 MG tablet TAKE 1 TABLET DAILY 30 tablet 2  . insulin NPH-regular Human (NOVOLIN 70/30) (70-30) 100 UNIT/ML injection Inject 40 Units into the skin 3 (three) times daily.    Marland Kitchen lisinopril (ZESTRIL) 10 MG tablet Take 10 mg by mouth 2 (two) times daily.    . metFORMIN (GLUCOPHAGE-XR) 500 MG 24 hr tablet Take 500 mg by mouth 2 (two) times daily.    . pregabalin (LYRICA) 150 MG capsule Take 150 mg by mouth 3 (three) times daily.    . sertraline (ZOLOFT) 100 MG tablet Take 100 mg by mouth once daily 180 tablet 1   No current facility-administered medications for this visit.    REVIEW OF SYSTEMS:   [X]  denotes positive finding, [ ]  denotes negative finding Cardiac  Comments:  Chest pain or chest pressure:    Shortness of breath upon exertion:    Short of breath when lying flat:    Irregular heart rhythm:        Vascular    Pain in calf, thigh, or hip brought on by ambulation: x   Pain in feet at night that wakes you up from your sleep:  x   Blood clot in your veins:    Leg swelling:  x       Pulmonary    Oxygen at home:    Productive cough:     Wheezing:         Neurologic    Sudden weakness in arms or legs:     Sudden numbness in arms or legs:     Sudden onset of difficulty speaking or slurred speech:    Temporary loss of vision in one eye:     Problems with dizziness:         Gastrointestinal    Blood in stool:     Vomited blood:         Genitourinary    Burning when urinating:     Blood in urine:        Psychiatric    Major depression:         Hematologic  Bleeding problems:    Problems with blood clotting too easily:        Skin    Rashes  or ulcers: xx       Constitutional    Fever or chills:      PHYSICAL EXAM:   Vitals:   10/28/19 1446  BP: (!) 151/83  Pulse: 89  Resp: 20  Temp: 97.8 F (36.6 C)  SpO2: 92%  Weight: 259 lb (117.5 kg)  Height: 5\' 7"  (1.702 m)    GENERAL: The patient is a well-nourished female, in no acute distress. The vital signs are documented above. CARDIAC: There is a regular rate and rhythm.  VASCULAR: Nonpalpable pedal pulses PULMONARY: Non-labored respirations ABDOMEN: Soft and non-tender with normal pitched bowel sounds.  MUSCULOSKELETAL: There are no major deformities or cyanosis. NEUROLOGIC: No focal weakness or paresthesias are detected. SKIN: See photo below  PSYCHIATRIC: The patient has a normal affect.    STUDIES:   I have reviewed the following studies: ABI/TBIToday's ABIToday's TBIPrevious ABIPrevious TBI  +-------+-----------+-----------+------------+------------+  Right 0.52    0.49    0.43            +-------+-----------+-----------+------------+------------+  Left  0.23    -     0.26    0.19 pre-op   +-------+-----------+-----------+------------+------------+   Right toe pressure: 89 Left toe pressure: Absent   Carotid:  Right Carotid: Velocities in the right ICA are consistent with a 80-99%         stenosis.   Left Carotid: The ECA appears >50% stenosed. Patent carotid endarterectomy  site        with no evidence for restenosis.   Vertebrals: Right vertebral artery demonstrates antegrade flow. Left  vertebral        artery demonstrates atypical antegrade flow.  Subclavians: Normal flow hemodynamics were seen in bilateral subclavian        arteries.  MEDICAL ISSUES:   Atherosclerotic lower extremity vascular disease with ulceration, left leg: I discussed with her that this is a limb threatening situation.  She needs to undergo angiography to see what her revascularization  options are.  I am going to do this tomorrow.  This will be through a right femoral approach with aortogram and bilateral runoff.  I will treat her percutaneously if indicated.  Carotid disease: The patient has a greater than 80% right carotid stenosis which is asymptomatic.  I told her that I would do with her leg first and then address her carotid.    Leia Alf, MD, FACS [facility] [phone] Pager [phone]

## 2019-10-28 NOTE — Patient Instructions (Signed)
Check

## 2019-10-29 ENCOUNTER — Ambulatory Visit (HOSPITAL_COMMUNITY)
Admission: RE | Admit: 2019-10-29 | Discharge: 2019-10-29 | Disposition: A | Discharge status: Home / Self Care | Payer: Medicare Other | Source: Home / Self Care | Attending: Surgery | Admitting: Surgery

## 2019-10-29 ENCOUNTER — Other Ambulatory Visit: Payer: Self-pay

## 2019-10-29 ENCOUNTER — Encounter (HOSPITAL_COMMUNITY): Payer: Self-pay | Admitting: Surgery

## 2019-10-29 ENCOUNTER — Encounter (HOSPITAL_COMMUNITY)
Admission: RE | Disposition: A | Discharge status: Home / Self Care | Payer: Self-pay | Source: Home / Self Care | Attending: Surgery

## 2019-10-29 DIAGNOSIS — J449 Chronic obstructive pulmonary disease, unspecified: Secondary | ICD-10-CM | POA: Insufficient documentation

## 2019-10-29 DIAGNOSIS — L97829 Non-pressure chronic ulcer of other part of left lower leg with unspecified severity: Secondary | ICD-10-CM | POA: Insufficient documentation

## 2019-10-29 DIAGNOSIS — Z91041 Radiographic dye allergy status: Secondary | ICD-10-CM | POA: Insufficient documentation

## 2019-10-29 DIAGNOSIS — Z885 Allergy status to narcotic agent status: Secondary | ICD-10-CM | POA: Insufficient documentation

## 2019-10-29 DIAGNOSIS — Z825 Family history of asthma and other chronic lower respiratory diseases: Secondary | ICD-10-CM | POA: Insufficient documentation

## 2019-10-29 DIAGNOSIS — I10 Essential (primary) hypertension: Secondary | ICD-10-CM | POA: Insufficient documentation

## 2019-10-29 DIAGNOSIS — Z6841 Body Mass Index (BMI) 40.0 and over, adult: Secondary | ICD-10-CM | POA: Insufficient documentation

## 2019-10-29 DIAGNOSIS — I70249 Atherosclerosis of native arteries of left leg with ulceration of unspecified site: Secondary | ICD-10-CM

## 2019-10-29 DIAGNOSIS — Z79899 Other long term (current) drug therapy: Secondary | ICD-10-CM | POA: Insufficient documentation

## 2019-10-29 DIAGNOSIS — I6522 Occlusion and stenosis of left carotid artery: Secondary | ICD-10-CM | POA: Insufficient documentation

## 2019-10-29 DIAGNOSIS — Z20822 Contact with and (suspected) exposure to covid-19: Secondary | ICD-10-CM | POA: Insufficient documentation

## 2019-10-29 DIAGNOSIS — E785 Hyperlipidemia, unspecified: Secondary | ICD-10-CM | POA: Insufficient documentation

## 2019-10-29 DIAGNOSIS — Z882 Allergy status to sulfonamides status: Secondary | ICD-10-CM | POA: Insufficient documentation

## 2019-10-29 DIAGNOSIS — Z7982 Long term (current) use of aspirin: Secondary | ICD-10-CM | POA: Insufficient documentation

## 2019-10-29 DIAGNOSIS — I70248 Atherosclerosis of native arteries of left leg with ulceration of other part of lower left leg: Secondary | ICD-10-CM | POA: Insufficient documentation

## 2019-10-29 DIAGNOSIS — Z8249 Family history of ischemic heart disease and other diseases of the circulatory system: Secondary | ICD-10-CM | POA: Insufficient documentation

## 2019-10-29 DIAGNOSIS — F17210 Nicotine dependence, cigarettes, uncomplicated: Secondary | ICD-10-CM | POA: Insufficient documentation

## 2019-10-29 DIAGNOSIS — Z880 Allergy status to penicillin: Secondary | ICD-10-CM | POA: Insufficient documentation

## 2019-10-29 DIAGNOSIS — Z7984 Long term (current) use of oral hypoglycemic drugs: Secondary | ICD-10-CM | POA: Insufficient documentation

## 2019-10-29 DIAGNOSIS — E11621 Type 2 diabetes mellitus with foot ulcer: Secondary | ICD-10-CM | POA: Insufficient documentation

## 2019-10-29 HISTORY — PX: ABDOMINAL AORTOGRAM W/LOWER EXTREMITY: CATH118223

## 2019-10-29 LAB — SARS CORONAVIRUS 2 BY RT PCR (HOSPITAL ORDER, PERFORMED IN ~~LOC~~ HOSPITAL LAB): SARS Coronavirus 2: NEGATIVE

## 2019-10-29 LAB — POCT I-STAT, CHEM 8
BUN: 11 mg/dL (ref 8–23)
Calcium, Ion: 1.11 mmol/L — ABNORMAL LOW (ref 1.15–1.40)
Chloride: 92 mmol/L — ABNORMAL LOW (ref 98–111)
Creatinine, Ser: 0.5 mg/dL (ref 0.44–1.00)
Glucose, Bld: 112 mg/dL — ABNORMAL HIGH (ref 70–99)
HCT: 45 % (ref 36.0–46.0)
Hemoglobin: 15.3 g/dL — ABNORMAL HIGH (ref 12.0–15.0)
Potassium: 3.9 mmol/L (ref 3.5–5.1)
Sodium: 132 mmol/L — ABNORMAL LOW (ref 135–145)
TCO2: 29 mmol/L (ref 22–32)

## 2019-10-29 SURGERY — ABDOMINAL AORTOGRAM W/LOWER EXTREMITY
Anesthesia: LOCAL

## 2019-10-29 MED ORDER — HEPARIN (PORCINE) IN NACL 1000-0.9 UT/500ML-% IV SOLN
INTRAVENOUS | Status: AC
  Filled 2019-10-29: qty 1000

## 2019-10-29 MED ORDER — LABETALOL HCL 5 MG/ML IV SOLN
INTRAVENOUS | Status: AC
  Filled 2019-10-29: qty 4

## 2019-10-29 MED ORDER — SODIUM CHLORIDE 0.9 % WEIGHT BASED INFUSION: 1.0000 mL/kg/h | INTRAVENOUS | Status: DC

## 2019-10-29 MED ORDER — MIDAZOLAM HCL 2 MG/2ML IJ SOLN
INTRAMUSCULAR | Status: AC
  Filled 2019-10-29: qty 2

## 2019-10-29 MED ORDER — SODIUM CHLORIDE 0.9 % IV SOLN: 250.0000 mL | INTRAVENOUS | Status: DC | PRN

## 2019-10-29 MED ORDER — HYDRALAZINE HCL 20 MG/ML IJ SOLN: 5.0000 mg | INTRAMUSCULAR | Status: DC | PRN

## 2019-10-29 MED ORDER — METHYLPREDNISOLONE SODIUM SUCC 125 MG IJ SOLR
INTRAMUSCULAR | Status: AC
  Filled 2019-10-29: qty 2

## 2019-10-29 MED ORDER — SODIUM CHLORIDE 0.9% FLUSH: 3.0000 mL | INTRAVENOUS | Status: DC | PRN

## 2019-10-29 MED ORDER — OXYCODONE HCL 5 MG PO TABS
ORAL_TABLET | ORAL | Status: AC
  Filled 2019-10-29: qty 2

## 2019-10-29 MED ORDER — SODIUM CHLORIDE 0.9 % IV SOLN: INTRAVENOUS | Status: DC

## 2019-10-29 MED ORDER — MIDAZOLAM HCL 2 MG/2ML IJ SOLN
INTRAMUSCULAR | Status: DC | PRN
  Administered 2019-10-29 (×2): 0.5 mg via INTRAVENOUS
  Administered 2019-10-29: 2 mg via INTRAVENOUS

## 2019-10-29 MED ORDER — LIDOCAINE HCL (PF) 1 % IJ SOLN
INTRAMUSCULAR | Status: AC
  Filled 2019-10-29: qty 30

## 2019-10-29 MED ORDER — LABETALOL HCL 5 MG/ML IV SOLN
10.0000 mg | INTRAVENOUS | Status: DC | PRN
  Administered 2019-10-29: 10 mg via INTRAVENOUS

## 2019-10-29 MED ORDER — IODIXANOL 320 MG/ML IV SOLN
INTRAVENOUS | Status: DC | PRN
  Administered 2019-10-29: 130 mL via INTRA_ARTERIAL

## 2019-10-29 MED ORDER — DIPHENHYDRAMINE HCL 50 MG/ML IJ SOLN
INTRAMUSCULAR | Status: AC
  Administered 2019-10-29: 25 mg
  Filled 2019-10-29: qty 1

## 2019-10-29 MED ORDER — OXYCODONE HCL 5 MG PO TABS: 5.0000 mg | ORAL_TABLET | Freq: Three times a day (TID) | ORAL | 0 refills | Status: DC | PRN

## 2019-10-29 MED ORDER — METHYLPREDNISOLONE SODIUM SUCC 125 MG IJ SOLR
125.0000 mg | Freq: Once | INTRAMUSCULAR | Status: AC
  Administered 2019-10-29: 125 mg via INTRAVENOUS

## 2019-10-29 MED ORDER — ONDANSETRON HCL 4 MG/2ML IJ SOLN: 4.0000 mg | Freq: Four times a day (QID) | INTRAMUSCULAR | Status: DC | PRN

## 2019-10-29 MED ORDER — HEPARIN (PORCINE) IN NACL 1000-0.9 UT/500ML-% IV SOLN
INTRAVENOUS | Status: DC | PRN
  Administered 2019-10-29 (×2): 500 mL

## 2019-10-29 MED ORDER — DIPHENHYDRAMINE HCL 50 MG/ML IJ SOLN: 25.0000 mg | Freq: Once | INTRAMUSCULAR | Status: DC

## 2019-10-29 MED ORDER — ACETAMINOPHEN 325 MG PO TABS: 650.0000 mg | ORAL_TABLET | ORAL | Status: DC | PRN

## 2019-10-29 MED ORDER — FENTANYL CITRATE (PF) 100 MCG/2ML IJ SOLN
INTRAMUSCULAR | Status: DC | PRN
  Administered 2019-10-29: 50 ug via INTRAVENOUS
  Administered 2019-10-29: 25 ug via INTRAVENOUS

## 2019-10-29 MED ORDER — OXYCODONE HCL 5 MG PO TABS
5.0000 mg | ORAL_TABLET | ORAL | Status: DC | PRN
  Administered 2019-10-29: 10 mg via ORAL

## 2019-10-29 MED ORDER — FENTANYL CITRATE (PF) 100 MCG/2ML IJ SOLN
INTRAMUSCULAR | Status: AC
  Filled 2019-10-29: qty 2

## 2019-10-29 MED ORDER — SODIUM CHLORIDE 0.9% FLUSH: 3.0000 mL | Freq: Two times a day (BID) | INTRAVENOUS | Status: DC

## 2019-10-29 MED ORDER — LIDOCAINE HCL (PF) 1 % IJ SOLN
INTRAMUSCULAR | Status: DC | PRN
  Administered 2019-10-29: 15 mL via INTRADERMAL

## 2019-10-29 SURGICAL SUPPLY — 14 items
BAG SNAP BAND KOVER 36X36 (MISCELLANEOUS) ×2 IMPLANT
CATH OMNI FLUSH 5F 65CM (CATHETERS) ×1 IMPLANT
COVER DOME SNAP 22 D (MISCELLANEOUS) ×1 IMPLANT
DEVICE CLOSURE MYNXGRIP 5F (Vascular Products) ×1 IMPLANT
KIT MICROPUNCTURE NIT STIFF (SHEATH) ×1 IMPLANT
KIT PV (KITS) ×2 IMPLANT
PROTECTION STATION PRESSURIZED (MISCELLANEOUS) ×2
SHEATH PINNACLE 5F 10CM (SHEATH) ×1 IMPLANT
SHEATH PROBE COVER 6X72 (BAG) ×1 IMPLANT
STATION PROTECTION PRESSURIZED (MISCELLANEOUS) IMPLANT
SYR MEDRAD MARK V 150ML (SYRINGE) ×1 IMPLANT
TRANSDUCER W/STOPCOCK (MISCELLANEOUS) ×2 IMPLANT
TRAY PV CATH (CUSTOM PROCEDURE TRAY) ×2 IMPLANT
WIRE BENTSON .035X145CM (WIRE) ×1 IMPLANT

## 2019-10-29 NOTE — Interval H&P Note (Signed)
History and Physical Interval Note:  10/29/2019 10:18 PM  [name] [name]  has presented today for surgery, with the diagnosis of claudication.  The various methods of treatment have been discussed with the patient and family. After consideration of risks, benefits and other options for treatment, the patient has consented to  Procedure(s): ABDOMINAL AORTOGRAM W/LOWER EXTREMITY (N/A) as a surgical intervention.  The patient's history has been reviewed, patient examined, no change in status, stable for surgery.  I have reviewed the patient's chart and labs.  Questions were answered to the patient's satisfaction.     [name]

## 2019-10-29 NOTE — Op Note (Signed)
° ° °  Patient name: [name] [name] MRN: [id number] DOB: [birth year] Sex: female  10/29/2019 Pre-operative Diagnosis: Left leg ulcers Post-operative diagnosis:  Same Surgeon:  [name] Procedure Performed:  1.  Ultrasound-guided access, right femoral artery  2.  Abdominal aortogram  3.  Bilateral lower extremity runoff  4.  Second-order catheterization  5.  Conscious sedation, 54 minutes     Indications: The patient has a history of a right femoral-popliteal bypass graft.  She has new ulceration on the left and comes in for arteriogram.  Procedure:  The patient was identified in the holding area and taken to room 8.  The patient was then placed supine on the table and prepped and draped in the usual sterile fashion.  A time out was called.  Conscious sedation was administered with the use of IV fentanyl and Versed under continuous physician and nurse monitoring.  Heart rate, blood pressure, and oxygen saturation were continuously monitored.  Total sedation time was 42minutes.  Ultrasound was used to evaluate the right common femoral artery.  It was patent .  A digital ultrasound image was acquired.  A micropuncture needle was used to access the right common femoral artery under ultrasound guidance.  An 018 wire was advanced without resistance and a micropuncture sheath was placed.  The 018 wire was removed and a benson wire was placed.  The micropuncture sheath was exchanged for a 5 french sheath.  An omniflush catheter was advanced over the wire to the level of L-1.  An abdominal angiogram was obtained.  Next, using the omniflush catheter and a benson wire, the aortic bifurcation was crossed and the catheter was placed into theleft external iliac artery and left runoff was obtained.  right runoff was performed via retrograde sheath injections.  Findings:   Aortogram: No significant renal artery stenosis was identified.  The infrarenal abdominal aorta is widely patent.  Bilateral common and  external iliac arteries widely patent.  Right Lower Extremity: The right common femoral and profundofemoral artery widely patent.  The superficial femoral artery is occluded.  There is a vein bypass originating in the common femoral artery with a widely patent proximal and distal anastomosis.  In the midportion there appears to be a retained valve with approximately 60% stenosis.  There is three-vessel runoff.  Left Lower Extremity: The left common femoral artery is patent however there is severe calcification at its distal extent with moderate stenosis.  The profundofemoral artery is patent and reconstitutes a diseased above-knee popliteal artery.  The below-knee popliteal artery is widely patent.  There is three-vessel runoff to the ankle.  The posterior tibial and anterior tibial reconstituted the plantar arch  Intervention: None.  A minx was used for closure  Impression:  #1 flush occlusion of the left superficial femoral artery with reconstitution of a diseased above-knee popliteal artery and three-vessel runoff  #2 60% stenosis within the mid to distal portion of the right femoral-popliteal bypass graft  #3 the patient will be brought back for left femoral-popliteal bypass graft and angioplasty of the bypass graft stenosis    V. [name], M.D., [facility] [place] Office: [phone] Pager:  [phone]

## 2019-10-29 NOTE — Progress Notes (Signed)
Dr Trula Slade in and notified client's O2 sats decrease to low 80's when asleep; no new orders and ok to d/c home to return tomorrow for surgery

## 2019-10-29 NOTE — Progress Notes (Signed)
Ms Bynum denies chest pain or shortness of breath. Patient tested negative for Covid_8/10/21_ and has been in quarantine since that time. Patient has type II diabetes, patient reports that CBGs run 100, 112. Patient had taken 70/30 Insulin tonight- she took 50 units instead of 75 units..  I instructed patient to not take Insulin or Metformin in am. I instructed patient to check CBG after awaking and every 2 hours until arrival  to the hospital.  I Instructed patient if CBG is less than 70 to drink or 1/2 cup of a clear juice. Recheck CBG in 15 minutes then call pre- op desk at [phone] for further instructions.

## 2019-10-29 NOTE — Discharge Instructions (Signed)
NO METFORMIN/ GLUCOPHAGE FOR 2 DAYS  Femoral Site Care This sheet gives you information about how to care for yourself after your procedure. Your health care provider may also give you more specific instructions. If you have problems or questions, contact your health care provider. What can I expect after the procedure? After the procedure, it is common to have:  Bruising that usually fades within 1-2 weeks.  Tenderness at the site. Follow these instructions at home: Wound care  Follow instructions from your health care provider about how to take care of your insertion site. Make sure you: ? Wash your hands with soap and water before you change your bandage (dressing). If soap and water are not available, use hand sanitizer. ? Change your dressing as told by your health care provider. ? Leave stitches (sutures), skin glue, or adhesive strips in place. These skin closures may need to stay in place for 2 weeks or longer. If adhesive strip edges start to loosen and curl up, you may trim the loose edges. Do not remove adhesive strips completely unless your health care provider tells you to do that.  Do not take baths, swim, or use a hot tub until your health care provider approves.  You may shower 24-48 hours after the procedure or as told by your health care provider. ? Gently wash the site with plain soap and water. ? Pat the area dry with a clean towel. ? Do not rub the site. This may cause bleeding.  Do not apply powder or lotion to the site. Keep the site clean and dry.  Check your femoral site every day for signs of infection. Check for: ? Redness, swelling, or pain. ? Fluid or blood. ? Warmth. ? Pus or a bad smell. Activity  For the first 2-3 days after your procedure, or as long as directed: ? Avoid climbing stairs as much as possible. ? Do not squat.  Do not lift anything that is heavier than 10 lb (4.5 kg), or the limit that you are told, until your health care provider  says that it is safe.  Rest as directed. ? Avoid sitting for a long time without moving. Get up to take short walks every 1-2 hours.  Do not drive for 24 hours if you were given a medicine to help you relax (sedative). General instructions  Take over-the-counter and prescription medicines only as told by your health care provider.  Keep all follow-up visits as told by your health care provider. This is important. Contact a health care provider if you have:  A fever or chills.  You have redness, swelling, or pain around your insertion site. Get help right away if:  The catheter insertion area swells very fast.  You pass out.  You suddenly start to sweat or your skin gets clammy.  The catheter insertion area is bleeding, and the bleeding does not stop when you hold steady pressure on the area.  The area near or just beyond the catheter insertion site becomes pale, cool, tingly, or numb. These symptoms may represent a serious problem that is an emergency. Do not wait to see if the symptoms will go away. Get medical help right away. Call your local emergency services (911 in the [place] Do not drive yourself to the hospital. Summary  After the procedure, it is common to have bruising that usually fades within 1-2 weeks.  Check your femoral site every day for signs of infection.  Do not lift anything that is heavier  than 10 lb (4.5 kg), or the limit that you are told, until your health care provider says that it is safe. This information is not intended to replace advice given to you by your health care provider. Make sure you discuss any questions you have with your health care provider. Document Revised: 03/20/2017 Document Reviewed: 03/20/2017 Elsevier Patient Education  2020 [employer].

## 2019-10-30 ENCOUNTER — Inpatient Hospital Stay (HOSPITAL_COMMUNITY): Payer: Medicare Other | Admitting: Certified Registered Nurse Anesthetist

## 2019-10-30 ENCOUNTER — Encounter (HOSPITAL_COMMUNITY)
Admission: RE | Disposition: A | Discharge status: Home / Self Care | Payer: Self-pay | Source: Home / Self Care | Attending: Surgery

## 2019-10-30 ENCOUNTER — Inpatient Hospital Stay (HOSPITAL_COMMUNITY): Payer: Medicare Other

## 2019-10-30 ENCOUNTER — Inpatient Hospital Stay (HOSPITAL_COMMUNITY)
Admission: RE | Admit: 2019-10-30 | Discharge: 2019-11-16 | DRG: 270 | Disposition: A | Discharge status: Home Health | Payer: Medicare Other | Attending: Surgery | Admitting: Surgery

## 2019-10-30 ENCOUNTER — Encounter (HOSPITAL_COMMUNITY): Payer: Self-pay | Admitting: Surgery

## 2019-10-30 DIAGNOSIS — E538 Deficiency of other specified B group vitamins: Secondary | ICD-10-CM | POA: Diagnosis present

## 2019-10-30 DIAGNOSIS — I739 Peripheral vascular disease, unspecified: Secondary | ICD-10-CM | POA: Diagnosis present

## 2019-10-30 DIAGNOSIS — J441 Chronic obstructive pulmonary disease with (acute) exacerbation: Secondary | ICD-10-CM

## 2019-10-30 DIAGNOSIS — J189 Pneumonia, unspecified organism: Secondary | ICD-10-CM | POA: Diagnosis not present

## 2019-10-30 DIAGNOSIS — Z6841 Body Mass Index (BMI) 40.0 and over, adult: Secondary | ICD-10-CM | POA: Diagnosis not present

## 2019-10-30 DIAGNOSIS — R0602 Shortness of breath: Secondary | ICD-10-CM | POA: Diagnosis not present

## 2019-10-30 DIAGNOSIS — F419 Anxiety disorder, unspecified: Secondary | ICD-10-CM | POA: Diagnosis not present

## 2019-10-30 DIAGNOSIS — E11621 Type 2 diabetes mellitus with foot ulcer: Secondary | ICD-10-CM | POA: Diagnosis present

## 2019-10-30 DIAGNOSIS — I9581 Postprocedural hypotension: Secondary | ICD-10-CM | POA: Diagnosis not present

## 2019-10-30 DIAGNOSIS — Z885 Allergy status to narcotic agent status: Secondary | ICD-10-CM

## 2019-10-30 DIAGNOSIS — E785 Hyperlipidemia, unspecified: Secondary | ICD-10-CM | POA: Diagnosis present

## 2019-10-30 DIAGNOSIS — L97429 Non-pressure chronic ulcer of left heel and midfoot with unspecified severity: Secondary | ICD-10-CM | POA: Diagnosis present

## 2019-10-30 DIAGNOSIS — D62 Acute posthemorrhagic anemia: Secondary | ICD-10-CM | POA: Diagnosis not present

## 2019-10-30 DIAGNOSIS — I97191 Other postprocedural cardiac functional disturbances following other surgery: Secondary | ICD-10-CM | POA: Diagnosis not present

## 2019-10-30 DIAGNOSIS — Z9889 Other specified postprocedural states: Secondary | ICD-10-CM

## 2019-10-30 DIAGNOSIS — I4510 Unspecified right bundle-branch block: Secondary | ICD-10-CM | POA: Diagnosis present

## 2019-10-30 DIAGNOSIS — F329 Major depressive disorder, single episode, unspecified: Secondary | ICD-10-CM | POA: Diagnosis present

## 2019-10-30 DIAGNOSIS — I5042 Chronic combined systolic (congestive) and diastolic (congestive) heart failure: Secondary | ICD-10-CM

## 2019-10-30 DIAGNOSIS — E1151 Type 2 diabetes mellitus with diabetic peripheral angiopathy without gangrene: Secondary | ICD-10-CM | POA: Diagnosis present

## 2019-10-30 DIAGNOSIS — Z20822 Contact with and (suspected) exposure to covid-19: Secondary | ICD-10-CM | POA: Diagnosis present

## 2019-10-30 DIAGNOSIS — L409 Psoriasis, unspecified: Secondary | ICD-10-CM | POA: Diagnosis present

## 2019-10-30 DIAGNOSIS — I5033 Acute on chronic diastolic (congestive) heart failure: Secondary | ICD-10-CM | POA: Diagnosis not present

## 2019-10-30 DIAGNOSIS — Z8719 Personal history of other diseases of the digestive system: Secondary | ICD-10-CM

## 2019-10-30 DIAGNOSIS — Z91041 Radiographic dye allergy status: Secondary | ICD-10-CM

## 2019-10-30 DIAGNOSIS — Z823 Family history of stroke: Secondary | ICD-10-CM

## 2019-10-30 DIAGNOSIS — Y95 Nosocomial condition: Secondary | ICD-10-CM | POA: Diagnosis present

## 2019-10-30 DIAGNOSIS — Z8249 Family history of ischemic heart disease and other diseases of the circulatory system: Secondary | ICD-10-CM

## 2019-10-30 DIAGNOSIS — J9601 Acute respiratory failure with hypoxia: Secondary | ICD-10-CM | POA: Diagnosis not present

## 2019-10-30 DIAGNOSIS — I70262 Atherosclerosis of native arteries of extremities with gangrene, left leg: Secondary | ICD-10-CM | POA: Diagnosis present

## 2019-10-30 DIAGNOSIS — Z794 Long term (current) use of insulin: Secondary | ICD-10-CM

## 2019-10-30 DIAGNOSIS — E872 Acidosis: Secondary | ICD-10-CM | POA: Diagnosis not present

## 2019-10-30 DIAGNOSIS — J439 Emphysema, unspecified: Secondary | ICD-10-CM | POA: Diagnosis present

## 2019-10-30 DIAGNOSIS — E871 Hypo-osmolality and hyponatremia: Secondary | ICD-10-CM | POA: Diagnosis not present

## 2019-10-30 DIAGNOSIS — E1142 Type 2 diabetes mellitus with diabetic polyneuropathy: Secondary | ICD-10-CM | POA: Diagnosis present

## 2019-10-30 DIAGNOSIS — I70239 Atherosclerosis of native arteries of right leg with ulceration of unspecified site: Secondary | ICD-10-CM

## 2019-10-30 DIAGNOSIS — I6523 Occlusion and stenosis of bilateral carotid arteries: Secondary | ICD-10-CM | POA: Diagnosis present

## 2019-10-30 DIAGNOSIS — I214 Non-ST elevation (NSTEMI) myocardial infarction: Secondary | ICD-10-CM | POA: Diagnosis not present

## 2019-10-30 DIAGNOSIS — E7800 Pure hypercholesterolemia, unspecified: Secondary | ICD-10-CM | POA: Diagnosis present

## 2019-10-30 DIAGNOSIS — I110 Hypertensive heart disease with heart failure: Secondary | ICD-10-CM | POA: Diagnosis present

## 2019-10-30 DIAGNOSIS — Z79899 Other long term (current) drug therapy: Secondary | ICD-10-CM

## 2019-10-30 DIAGNOSIS — I248 Other forms of acute ischemic heart disease: Secondary | ICD-10-CM | POA: Diagnosis present

## 2019-10-30 DIAGNOSIS — Z825 Family history of asthma and other chronic lower respiratory diseases: Secondary | ICD-10-CM

## 2019-10-30 DIAGNOSIS — I4891 Unspecified atrial fibrillation: Secondary | ICD-10-CM | POA: Diagnosis not present

## 2019-10-30 DIAGNOSIS — I70461 Atherosclerosis of autologous vein bypass graft(s) of the extremities with gangrene, right leg: Secondary | ICD-10-CM | POA: Diagnosis present

## 2019-10-30 DIAGNOSIS — R0603 Acute respiratory distress: Secondary | ICD-10-CM | POA: Diagnosis not present

## 2019-10-30 DIAGNOSIS — R0600 Dyspnea, unspecified: Secondary | ICD-10-CM

## 2019-10-30 DIAGNOSIS — E1152 Type 2 diabetes mellitus with diabetic peripheral angiopathy with gangrene: Secondary | ICD-10-CM | POA: Diagnosis present

## 2019-10-30 DIAGNOSIS — E1165 Type 2 diabetes mellitus with hyperglycemia: Secondary | ICD-10-CM | POA: Diagnosis not present

## 2019-10-30 DIAGNOSIS — F22 Delusional disorders: Secondary | ICD-10-CM | POA: Diagnosis not present

## 2019-10-30 DIAGNOSIS — I70249 Atherosclerosis of native arteries of left leg with ulceration of unspecified site: Secondary | ICD-10-CM

## 2019-10-30 DIAGNOSIS — F17210 Nicotine dependence, cigarettes, uncomplicated: Secondary | ICD-10-CM | POA: Diagnosis present

## 2019-10-30 DIAGNOSIS — Z880 Allergy status to penicillin: Secondary | ICD-10-CM

## 2019-10-30 DIAGNOSIS — Z9049 Acquired absence of other specified parts of digestive tract: Secondary | ICD-10-CM

## 2019-10-30 DIAGNOSIS — Y838 Other surgical procedures as the cause of abnormal reaction of the patient, or of later complication, without mention of misadventure at the time of the procedure: Secondary | ICD-10-CM | POA: Diagnosis not present

## 2019-10-30 DIAGNOSIS — R0902 Hypoxemia: Secondary | ICD-10-CM

## 2019-10-30 DIAGNOSIS — R609 Edema, unspecified: Secondary | ICD-10-CM | POA: Diagnosis not present

## 2019-10-30 DIAGNOSIS — Z882 Allergy status to sulfonamides status: Secondary | ICD-10-CM

## 2019-10-30 DIAGNOSIS — J811 Chronic pulmonary edema: Secondary | ICD-10-CM

## 2019-10-30 DIAGNOSIS — J95821 Acute postprocedural respiratory failure: Secondary | ICD-10-CM | POA: Diagnosis not present

## 2019-10-30 DIAGNOSIS — Z7982 Long term (current) use of aspirin: Secondary | ICD-10-CM

## 2019-10-30 HISTORY — PX: FEMORAL-POPLITEAL BYPASS GRAFT: SHX937

## 2019-10-30 HISTORY — DX: Unspecified osteoarthritis, unspecified site: M19.90

## 2019-10-30 HISTORY — PX: ANGIOPLASTY: SHX39

## 2019-10-30 HISTORY — DX: Dyspnea, unspecified: R06.00

## 2019-10-30 LAB — COMPREHENSIVE METABOLIC PANEL
ALT: 19 U/L (ref 0–44)
AST: 25 U/L (ref 15–41)
Albumin: 3.7 g/dL (ref 3.5–5.0)
Alkaline Phosphatase: 70 U/L (ref 38–126)
Anion gap: 12 (ref 5–15)
BUN: 9 mg/dL (ref 8–23)
CO2: 26 mmol/L (ref 22–32)
Calcium: 9.5 mg/dL (ref 8.9–10.3)
Chloride: 97 mmol/L — ABNORMAL LOW (ref 98–111)
Creatinine, Ser: 0.43 mg/dL — ABNORMAL LOW (ref 0.44–1.00)
GFR calc Af Amer: >60 mL/min (ref >60)
GFR calc non Af Amer: >60 mL/min (ref >60)
Glucose, Bld: 156 mg/dL — ABNORMAL HIGH (ref 70–99)
Potassium: 3.6 mmol/L (ref 3.5–5.1)
Sodium: 135 mmol/L (ref 135–145)
Total Bilirubin: 0.6 mg/dL (ref 0.3–1.2)
Total Protein: 6.5 g/dL (ref 6.5–8.1)

## 2019-10-30 LAB — CBC
HCT: 44.9 % (ref 36.0–46.0)
HCT: 28.3 % — ABNORMAL LOW (ref 36.0–46.0)
Hemoglobin: 13.9 g/dL (ref 12.0–15.0)
Hemoglobin: 8.5 g/dL — ABNORMAL LOW (ref 12.0–15.0)
MCH: 25.9 pg — ABNORMAL LOW (ref 26.0–34.0)
MCH: 25.6 pg — ABNORMAL LOW (ref 26.0–34.0)
MCHC: 31 g/dL (ref 30.0–36.0)
MCHC: 30 g/dL (ref 30.0–36.0)
MCV: 83.8 fL (ref 80.0–100.0)
MCV: 85.2 fL (ref 80.0–100.0)
Platelets: 134 10*3/uL — ABNORMAL LOW (ref 150–400)
Platelets: 138 10*3/uL — ABNORMAL LOW (ref 150–400)
RBC: 5.36 MIL/uL — ABNORMAL HIGH (ref 3.87–5.11)
RBC: 3.32 MIL/uL — ABNORMAL LOW (ref 3.87–5.11)
RDW: 15.4 % (ref 11.5–15.5)
RDW: 15.7 % — ABNORMAL HIGH (ref 11.5–15.5)
WBC: 10.7 10*3/uL — ABNORMAL HIGH (ref 4.0–10.5)
WBC: 16.7 10*3/uL — ABNORMAL HIGH (ref 4.0–10.5)
nRBC: 0 % (ref 0.0–0.2)
nRBC: 0 % (ref 0.0–0.2)

## 2019-10-30 LAB — POCT I-STAT 7, (LYTES, BLD GAS, ICA,H+H)
Acid-Base Excess: 2 mmol/L (ref 0.0–2.0)
Acid-Base Excess: 3 mmol/L — ABNORMAL HIGH (ref 0.0–2.0)
Bicarbonate: 28.2 mmol/L — ABNORMAL HIGH (ref 20.0–28.0)
Bicarbonate: 30.4 mmol/L — ABNORMAL HIGH (ref 20.0–28.0)
Calcium, Ion: 1.19 mmol/L (ref 1.15–1.40)
Calcium, Ion: 1.21 mmol/L (ref 1.15–1.40)
HCT: 40 % (ref 36.0–46.0)
HCT: 39 % (ref 36.0–46.0)
Hemoglobin: 13.6 g/dL (ref 12.0–15.0)
Hemoglobin: 13.3 g/dL (ref 12.0–15.0)
O2 Saturation: 96 %
O2 Saturation: 99 %
Patient temperature: 36
Patient temperature: 36.6
Potassium: 3.4 mmol/L — ABNORMAL LOW (ref 3.5–5.1)
Potassium: 4.2 mmol/L (ref 3.5–5.1)
Sodium: 135 mmol/L (ref 135–145)
Sodium: 132 mmol/L — ABNORMAL LOW (ref 135–145)
TCO2: 30 mmol/L (ref 22–32)
TCO2: 32 mmol/L (ref 22–32)
pCO2 arterial: 48.9 mmHg — ABNORMAL HIGH (ref 32.0–48.0)
pCO2 arterial: 55.8 mmHg — ABNORMAL HIGH (ref 32.0–48.0)
pH, Arterial: 7.364 (ref 7.350–7.450)
pH, Arterial: 7.343 — ABNORMAL LOW (ref 7.350–7.450)
pO2, Arterial: 80 mmHg — ABNORMAL LOW (ref 83.0–108.0)
pO2, Arterial: 146 mmHg — ABNORMAL HIGH (ref 83.0–108.0)

## 2019-10-30 LAB — GLUCOSE, CAPILLARY
Glucose-Capillary: 161 mg/dL — ABNORMAL HIGH (ref 70–99)
Glucose-Capillary: 232 mg/dL — ABNORMAL HIGH (ref 70–99)
Glucose-Capillary: 227 mg/dL — ABNORMAL HIGH (ref 70–99)

## 2019-10-30 LAB — POCT I-STAT, CHEM 8
BUN: 12 mg/dL (ref 8–23)
Calcium, Ion: 1.19 mmol/L (ref 1.15–1.40)
Chloride: 95 mmol/L — ABNORMAL LOW (ref 98–111)
Creatinine, Ser: 0.5 mg/dL (ref 0.44–1.00)
Glucose, Bld: 259 mg/dL — ABNORMAL HIGH (ref 70–99)
HCT: 30 % — ABNORMAL LOW (ref 36.0–46.0)
Hemoglobin: 10.2 g/dL — ABNORMAL LOW (ref 12.0–15.0)
Potassium: 4.2 mmol/L (ref 3.5–5.1)
Sodium: 133 mmol/L — ABNORMAL LOW (ref 135–145)
TCO2: 28 mmol/L (ref 22–32)

## 2019-10-30 LAB — SURGICAL PCR SCREEN
MRSA, PCR: NEGATIVE
Staphylococcus aureus: NEGATIVE

## 2019-10-30 LAB — POCT ACTIVATED CLOTTING TIME
Activated Clotting Time: 285 seconds
Activated Clotting Time: 191 seconds
Activated Clotting Time: 235 seconds
Activated Clotting Time: 186 seconds

## 2019-10-30 LAB — PROTIME-INR
INR: 1.1 (ref 0.8–1.2)
Prothrombin Time: 13.4 seconds (ref 11.4–15.2)

## 2019-10-30 LAB — APTT: aPTT: 35 seconds (ref 24–36)

## 2019-10-30 SURGERY — BYPASS GRAFT FEMORAL-POPLITEAL ARTERY
Anesthesia: General | Site: Leg Upper | Laterality: Right

## 2019-10-30 MED ORDER — HYDROMORPHONE HCL 1 MG/ML IJ SOLN
INTRAMUSCULAR | Status: DC | PRN
  Administered 2019-10-30 (×2): .25 mg via INTRAVENOUS
  Administered 2019-10-30: .5 mg via INTRAVENOUS

## 2019-10-30 MED ORDER — EPHEDRINE 5 MG/ML INJ
INTRAVENOUS | Status: AC
  Filled 2019-10-30: qty 10

## 2019-10-30 MED ORDER — 0.9 % SODIUM CHLORIDE (POUR BTL) OPTIME
TOPICAL | Status: DC | PRN
  Administered 2019-10-30: 1000 mL

## 2019-10-30 MED ORDER — PROPOFOL 10 MG/ML IV BOLUS
INTRAVENOUS | Status: AC
  Filled 2019-10-30: qty 20

## 2019-10-30 MED ORDER — PHENYLEPHRINE 40 MCG/ML (10ML) SYRINGE FOR IV PUSH (FOR BLOOD PRESSURE SUPPORT)
PREFILLED_SYRINGE | INTRAVENOUS | Status: AC
  Filled 2019-10-30: qty 10

## 2019-10-30 MED ORDER — PHENYLEPHRINE HCL-NACL 10-0.9 MG/250ML-% IV SOLN
INTRAVENOUS | Status: DC | PRN
  Administered 2019-10-30: 75 ug/min via INTRAVENOUS

## 2019-10-30 MED ORDER — SODIUM CHLORIDE 0.9 % IV SOLN
500.0000 mL | Freq: Once | INTRAVENOUS | Status: AC | PRN
  Administered 2019-10-30: 500 mL via INTRAVENOUS

## 2019-10-30 MED ORDER — ALBUTEROL SULFATE (2.5 MG/3ML) 0.083% IN NEBU
2.5000 mg | INHALATION_SOLUTION | Freq: Four times a day (QID) | RESPIRATORY_TRACT | Status: DC | PRN
  Administered 2019-11-02 – 2019-11-05 (×8): 2.5 mg via RESPIRATORY_TRACT
  Filled 2019-10-30 (×9): qty 3

## 2019-10-30 MED ORDER — ONDANSETRON HCL 4 MG/2ML IJ SOLN: 4.0000 mg | Freq: Four times a day (QID) | INTRAMUSCULAR | Status: DC | PRN

## 2019-10-30 MED ORDER — DIPHENHYDRAMINE HCL 50 MG/ML IJ SOLN
INTRAMUSCULAR | Status: DC | PRN
Start: 2019-10-30 — End: 2019-10-30
  Administered 2019-10-30: 12.5 mg via INTRAVENOUS

## 2019-10-30 MED ORDER — MORPHINE SULFATE (PF) 2 MG/ML IV SOLN
2.0000 mg | INTRAVENOUS | Status: DC | PRN
  Filled 2019-10-30: qty 1

## 2019-10-30 MED ORDER — ACETAMINOPHEN 650 MG RE SUPP: 325.0000 mg | RECTAL | Status: DC | PRN

## 2019-10-30 MED ORDER — ONDANSETRON HCL 4 MG/2ML IJ SOLN
INTRAMUSCULAR | Status: DC | PRN
  Administered 2019-10-30: 4 mg via INTRAVENOUS

## 2019-10-30 MED ORDER — SODIUM CHLORIDE 0.9 % IV SOLN
INTRAVENOUS | Status: DC | PRN
  Administered 2019-10-30: 500 mL

## 2019-10-30 MED ORDER — HYDROMORPHONE HCL 1 MG/ML IJ SOLN
INTRAMUSCULAR | Status: AC
  Filled 2019-10-30: qty 0.5

## 2019-10-30 MED ORDER — PROPOFOL 10 MG/ML IV BOLUS
INTRAVENOUS | Status: DC | PRN
  Administered 2019-10-30: 30 mg via INTRAVENOUS
  Administered 2019-10-30: 120 mg via INTRAVENOUS

## 2019-10-30 MED ORDER — ALBUTEROL SULFATE (2.5 MG/3ML) 0.083% IN NEBU: 2.5000 mg | INHALATION_SOLUTION | Freq: Once | RESPIRATORY_TRACT | Status: AC

## 2019-10-30 MED ORDER — EPHEDRINE SULFATE-NACL 50-0.9 MG/10ML-% IV SOSY
PREFILLED_SYRINGE | INTRAVENOUS | Status: DC | PRN
  Administered 2019-10-30 (×4): 10 mg via INTRAVENOUS
  Administered 2019-10-30: 5 mg via INTRAVENOUS

## 2019-10-30 MED ORDER — POTASSIUM CHLORIDE CRYS ER 20 MEQ PO TBCR
20.0000 meq | EXTENDED_RELEASE_TABLET | Freq: Every day | ORAL | Status: DC | PRN
  Filled 2019-10-30: qty 1

## 2019-10-30 MED ORDER — ORAL CARE MOUTH RINSE: 15.0000 mL | Freq: Once | OROMUCOSAL | Status: AC

## 2019-10-30 MED ORDER — ROCURONIUM BROMIDE 100 MG/10ML IV SOLN
INTRAVENOUS | Status: DC | PRN
  Administered 2019-10-30: 70 mg via INTRAVENOUS
  Administered 2019-10-30: 20 mg via INTRAVENOUS
  Administered 2019-10-30: 10 mg via INTRAVENOUS
  Administered 2019-10-30: 20 mg via INTRAVENOUS
  Administered 2019-10-30: 10 mg via INTRAVENOUS
  Administered 2019-10-30: 20 mg via INTRAVENOUS
  Administered 2019-10-30: 30 mg via INTRAVENOUS
  Administered 2019-10-30: 20 mg via INTRAVENOUS
  Administered 2019-10-30 (×2): 10 mg via INTRAVENOUS

## 2019-10-30 MED ORDER — CHLORHEXIDINE GLUCONATE 0.12 % MT SOLN
OROMUCOSAL | Status: AC
  Administered 2019-10-30: 15 mL via OROMUCOSAL
  Filled 2019-10-30: qty 15

## 2019-10-30 MED ORDER — CHLORHEXIDINE GLUCONATE 0.12 % MT SOLN: 15.0000 mL | Freq: Once | OROMUCOSAL | Status: AC

## 2019-10-30 MED ORDER — SODIUM CHLORIDE 0.9 % IV SOLN
INTRAVENOUS | Status: AC
  Filled 2019-10-30: qty 1.2

## 2019-10-30 MED ORDER — PHENOL 1.4 % MT LIQD: 1.0000 | OROMUCOSAL | Status: DC | PRN

## 2019-10-30 MED ORDER — ROCURONIUM BROMIDE 10 MG/ML (PF) SYRINGE
PREFILLED_SYRINGE | INTRAVENOUS | Status: AC
  Filled 2019-10-30: qty 10

## 2019-10-30 MED ORDER — SUGAMMADEX SODIUM 200 MG/2ML IV SOLN
INTRAVENOUS | Status: DC | PRN
  Administered 2019-10-30: 200 mg via INTRAVENOUS

## 2019-10-30 MED ORDER — MIDAZOLAM HCL 5 MG/5ML IJ SOLN
INTRAMUSCULAR | Status: DC | PRN
  Administered 2019-10-30: 2 mg via INTRAVENOUS

## 2019-10-30 MED ORDER — VANCOMYCIN HCL 1500 MG/300ML IV SOLN
1500.0000 mg | Freq: Once | INTRAVENOUS | Status: DC
  Filled 2019-10-30: qty 300

## 2019-10-30 MED ORDER — GABAPENTIN 300 MG PO CAPS
300.0000 mg | ORAL_CAPSULE | Freq: Three times a day (TID) | ORAL | Status: DC
  Administered 2019-10-30 – 2019-11-16 (×49): 300 mg via ORAL
  Filled 2019-10-30 (×50): qty 1

## 2019-10-30 MED ORDER — GUAIFENESIN-DM 100-10 MG/5ML PO SYRP
15.0000 mL | ORAL_SOLUTION | ORAL | Status: DC | PRN
  Administered 2019-10-31 – 2019-11-02 (×4): 15 mL via ORAL
  Filled 2019-10-30 (×4): qty 15

## 2019-10-30 MED ORDER — SODIUM CHLORIDE (PF) 0.9 % IJ SOLN
INTRAVENOUS | Status: DC | PRN
  Administered 2019-10-30: 15 mL via INTRAMUSCULAR

## 2019-10-30 MED ORDER — SODIUM CHLORIDE 0.9 % IV SOLN
INTRAVENOUS | Status: DC
  Administered 2019-10-31: 1000 mL via INTRAVENOUS

## 2019-10-30 MED ORDER — PHENYLEPHRINE HCL-NACL 10-0.9 MG/250ML-% IV SOLN
INTRAVENOUS | Status: AC
  Filled 2019-10-30: qty 250

## 2019-10-30 MED ORDER — CHLORHEXIDINE GLUCONATE CLOTH 2 % EX PADS: 6.0000 | MEDICATED_PAD | Freq: Once | CUTANEOUS | Status: DC

## 2019-10-30 MED ORDER — MIDAZOLAM HCL 2 MG/2ML IJ SOLN
INTRAMUSCULAR | Status: AC
  Filled 2019-10-30: qty 2

## 2019-10-30 MED ORDER — VANCOMYCIN HCL IN DEXTROSE 1-5 GM/200ML-% IV SOLN
1000.0000 mg | INTRAVENOUS | Status: AC
  Administered 2019-10-30: 1000 mg via INTRAVENOUS
  Filled 2019-10-30: qty 200

## 2019-10-30 MED ORDER — INSULIN ASPART PROT & ASPART (70-30 MIX) 100 UNIT/ML ~~LOC~~ SUSP
75.0000 [IU] | Freq: Two times a day (BID) | SUBCUTANEOUS | Status: DC
  Administered 2019-10-31 – 2019-11-02 (×4): 75 [IU] via SUBCUTANEOUS
  Administered 2019-11-02: 25 [IU] via SUBCUTANEOUS
  Administered 2019-11-03 – 2019-11-05 (×4): 75 [IU] via SUBCUTANEOUS
  Filled 2019-10-30 (×2): qty 10

## 2019-10-30 MED ORDER — MAGNESIUM SULFATE 2 GM/50ML IV SOLN: 2.0000 g | Freq: Every day | INTRAVENOUS | Status: DC | PRN

## 2019-10-30 MED ORDER — FENTANYL CITRATE (PF) 100 MCG/2ML IJ SOLN
25.0000 ug | INTRAMUSCULAR | Status: DC | PRN
  Administered 2019-10-30 (×2): 25 ug via INTRAVENOUS

## 2019-10-30 MED ORDER — ONDANSETRON HCL 4 MG/2ML IJ SOLN
INTRAMUSCULAR | Status: AC
  Filled 2019-10-30: qty 2

## 2019-10-30 MED ORDER — HEMOSTATIC AGENTS (NO CHARGE) OPTIME
TOPICAL | Status: DC | PRN
  Administered 2019-10-30: 1 via TOPICAL

## 2019-10-30 MED ORDER — DEXAMETHASONE SODIUM PHOSPHATE 10 MG/ML IJ SOLN
INTRAMUSCULAR | Status: AC
  Filled 2019-10-30: qty 1

## 2019-10-30 MED ORDER — DEXMEDETOMIDINE (PRECEDEX) IN NS 20 MCG/5ML (4 MCG/ML) IV SYRINGE
PREFILLED_SYRINGE | INTRAVENOUS | Status: DC | PRN
  Administered 2019-10-30: 8 ug via INTRAVENOUS
  Administered 2019-10-30: 12 ug via INTRAVENOUS

## 2019-10-30 MED ORDER — ALUM & MAG HYDROXIDE-SIMETH 200-200-20 MG/5ML PO SUSP: 15.0000 mL | ORAL | Status: DC | PRN

## 2019-10-30 MED ORDER — LIDOCAINE 2% (20 MG/ML) 5 ML SYRINGE
INTRAMUSCULAR | Status: DC | PRN
  Administered 2019-10-30: 60 mg via INTRAVENOUS

## 2019-10-30 MED ORDER — LACTATED RINGERS IV SOLN: INTRAVENOUS | Status: DC | PRN

## 2019-10-30 MED ORDER — LABETALOL HCL 5 MG/ML IV SOLN: 10.0000 mg | INTRAVENOUS | Status: DC | PRN

## 2019-10-30 MED ORDER — DEXAMETHASONE SODIUM PHOSPHATE 10 MG/ML IJ SOLN
INTRAMUSCULAR | Status: DC | PRN
  Administered 2019-10-30: 5 mg via INTRAVENOUS

## 2019-10-30 MED ORDER — LIDOCAINE 2% (20 MG/ML) 5 ML SYRINGE
INTRAMUSCULAR | Status: AC
  Filled 2019-10-30: qty 5

## 2019-10-30 MED ORDER — OXYCODONE-ACETAMINOPHEN 5-325 MG PO TABS
1.0000 | ORAL_TABLET | ORAL | Status: DC | PRN
  Administered 2019-10-31: 1 via ORAL
  Administered 2019-10-31: 2 via ORAL
  Administered 2019-10-31 – 2019-11-01 (×6): 1 via ORAL
  Administered 2019-11-02: 2 via ORAL
  Administered 2019-11-02 – 2019-11-04 (×5): 1 via ORAL
  Administered 2019-11-04 (×2): 2 via ORAL
  Administered 2019-11-06 – 2019-11-10 (×4): 1 via ORAL
  Administered 2019-11-12: 2 via ORAL
  Administered 2019-11-13 (×2): 1 via ORAL
  Filled 2019-10-30 (×4): qty 1
  Filled 2019-10-30: qty 2
  Filled 2019-10-30 (×2): qty 1
  Filled 2019-10-30: qty 2
  Filled 2019-10-30: qty 1
  Filled 2019-10-30: qty 2
  Filled 2019-10-30: qty 1
  Filled 2019-10-30: qty 2
  Filled 2019-10-30 (×3): qty 1
  Filled 2019-10-30: qty 2
  Filled 2019-10-30 (×8): qty 1

## 2019-10-30 MED ORDER — INSULIN ASPART 100 UNIT/ML ~~LOC~~ SOLN
5.0000 [IU] | SUBCUTANEOUS | Status: AC
  Administered 2019-10-30: 5 [IU] via INTRAVENOUS
  Filled 2019-10-30: qty 0.05

## 2019-10-30 MED ORDER — PROTAMINE SULFATE 10 MG/ML IV SOLN
INTRAVENOUS | Status: DC | PRN
  Administered 2019-10-30: 50 mg via INTRAVENOUS

## 2019-10-30 MED ORDER — PHENYLEPHRINE 40 MCG/ML (10ML) SYRINGE FOR IV PUSH (FOR BLOOD PRESSURE SUPPORT)
PREFILLED_SYRINGE | INTRAVENOUS | Status: DC | PRN
  Administered 2019-10-30 (×4): 80 ug via INTRAVENOUS
  Administered 2019-10-30: 120 ug via INTRAVENOUS
  Administered 2019-10-30 (×2): 80 ug via INTRAVENOUS
  Administered 2019-10-30: 40 ug via INTRAVENOUS
  Administered 2019-10-30 (×2): 80 ug via INTRAVENOUS
  Administered 2019-10-30: 40 ug via INTRAVENOUS
  Administered 2019-10-30: 80 ug via INTRAVENOUS
  Administered 2019-10-30: 120 ug via INTRAVENOUS
  Administered 2019-10-30: 40 ug via INTRAVENOUS
  Administered 2019-10-30: 120 ug via INTRAVENOUS

## 2019-10-30 MED ORDER — FENTANYL CITRATE (PF) 100 MCG/2ML IJ SOLN
INTRAMUSCULAR | Status: AC
  Administered 2019-10-30: 50 ug via INTRAVENOUS
  Filled 2019-10-30: qty 2

## 2019-10-30 MED ORDER — VANCOMYCIN HCL 1500 MG/300ML IV SOLN
1500.0000 mg | Freq: Once | INTRAVENOUS | Status: AC
  Administered 2019-10-30: 1500 mg via INTRAVENOUS
  Filled 2019-10-30 (×2): qty 300

## 2019-10-30 MED ORDER — VASOPRESSIN 20 UNIT/ML IV SOLN
INTRAVENOUS | Status: DC | PRN
  Administered 2019-10-30 (×2): 1 [IU] via INTRAVENOUS

## 2019-10-30 MED ORDER — PREGABALIN 75 MG PO CAPS
150.0000 mg | ORAL_CAPSULE | Freq: Two times a day (BID) | ORAL | Status: DC
  Administered 2019-10-30 – 2019-11-01 (×5): 150 mg via ORAL
  Filled 2019-10-30 (×4): qty 2
  Filled 2019-10-30: qty 6

## 2019-10-30 MED ORDER — ATORVASTATIN CALCIUM 10 MG PO TABS
20.0000 mg | ORAL_TABLET | Freq: Every day | ORAL | Status: DC
  Administered 2019-10-30 – 2019-11-04 (×6): 20 mg via ORAL
  Filled 2019-10-30 (×6): qty 2

## 2019-10-30 MED ORDER — HYDROCHLOROTHIAZIDE 25 MG PO TABS
25.0000 mg | ORAL_TABLET | Freq: Every day | ORAL | Status: DC
  Filled 2019-10-30 (×2): qty 1

## 2019-10-30 MED ORDER — LISINOPRIL 10 MG PO TABS
10.0000 mg | ORAL_TABLET | Freq: Two times a day (BID) | ORAL | Status: DC
  Filled 2019-10-30 (×2): qty 1

## 2019-10-30 MED ORDER — SODIUM CHLORIDE 0.9 % IV SOLN: INTRAVENOUS | Status: DC

## 2019-10-30 MED ORDER — FENTANYL CITRATE (PF) 250 MCG/5ML IJ SOLN
INTRAMUSCULAR | Status: AC
  Filled 2019-10-30: qty 5

## 2019-10-30 MED ORDER — ACETAMINOPHEN 325 MG PO TABS
325.0000 mg | ORAL_TABLET | ORAL | Status: DC | PRN
  Administered 2019-10-30 – 2019-11-11 (×5): 650 mg via ORAL
  Filled 2019-10-30 (×6): qty 2

## 2019-10-30 MED ORDER — ALBUTEROL SULFATE HFA 108 (90 BASE) MCG/ACT IN AERS: 2.0000 | INHALATION_SPRAY | Freq: Four times a day (QID) | RESPIRATORY_TRACT | Status: DC | PRN

## 2019-10-30 MED ORDER — FENTANYL CITRATE (PF) 250 MCG/5ML IJ SOLN
INTRAMUSCULAR | Status: DC | PRN
  Administered 2019-10-30: 50 ug via INTRAVENOUS
  Administered 2019-10-30: 100 ug via INTRAVENOUS
  Administered 2019-10-30: 50 ug via INTRAVENOUS
  Administered 2019-10-30: 100 ug via INTRAVENOUS

## 2019-10-30 MED ORDER — SERTRALINE HCL 100 MG PO TABS
100.0000 mg | ORAL_TABLET | Freq: Every day | ORAL | Status: DC
  Administered 2019-10-31 – 2019-11-16 (×17): 100 mg via ORAL
  Filled 2019-10-30 (×17): qty 1

## 2019-10-30 MED ORDER — SENNOSIDES-DOCUSATE SODIUM 8.6-50 MG PO TABS: 1.0000 | ORAL_TABLET | Freq: Every evening | ORAL | Status: DC | PRN

## 2019-10-30 MED ORDER — LACTATED RINGERS IV SOLN: INTRAVENOUS | Status: DC

## 2019-10-30 MED ORDER — ASPIRIN EC 81 MG PO TBEC
81.0000 mg | DELAYED_RELEASE_TABLET | Freq: Every day | ORAL | Status: DC
  Administered 2019-10-31 – 2019-11-16 (×17): 81 mg via ORAL
  Filled 2019-10-30 (×17): qty 1

## 2019-10-30 MED ORDER — ALBUMIN HUMAN 5 % IV SOLN
12.5000 g | Freq: Once | INTRAVENOUS | Status: AC
  Administered 2019-10-30: 12.5 g via INTRAVENOUS

## 2019-10-30 MED ORDER — METOPROLOL TARTRATE 5 MG/5ML IV SOLN: 2.0000 mg | INTRAVENOUS | Status: DC | PRN

## 2019-10-30 MED ORDER — ALBUTEROL SULFATE (2.5 MG/3ML) 0.083% IN NEBU
INHALATION_SOLUTION | RESPIRATORY_TRACT | Status: AC
  Administered 2019-10-30: 2.5 mg via RESPIRATORY_TRACT
  Filled 2019-10-30: qty 3

## 2019-10-30 MED ORDER — ALBUTEROL SULFATE HFA 108 (90 BASE) MCG/ACT IN AERS
INHALATION_SPRAY | RESPIRATORY_TRACT | Status: DC | PRN
Start: 2019-10-30 — End: 2019-10-30
  Administered 2019-10-30 (×2): 5 via RESPIRATORY_TRACT

## 2019-10-30 MED ORDER — PANTOPRAZOLE SODIUM 40 MG PO TBEC
40.0000 mg | DELAYED_RELEASE_TABLET | Freq: Every day | ORAL | Status: DC
  Administered 2019-10-30 – 2019-11-16 (×18): 40 mg via ORAL
  Filled 2019-10-30 (×18): qty 1

## 2019-10-30 MED ORDER — CHLORHEXIDINE GLUCONATE CLOTH 2 % EX PADS
6.0000 | MEDICATED_PAD | Freq: Every day | CUTANEOUS | Status: DC
  Administered 2019-10-30 – 2019-11-16 (×14): 6 via TOPICAL

## 2019-10-30 MED ORDER — ALBUMIN HUMAN 5 % IV SOLN: INTRAVENOUS | Status: DC | PRN

## 2019-10-30 MED ORDER — VASOPRESSIN 20 UNIT/ML IV SOLN
INTRAVENOUS | Status: AC
  Filled 2019-10-30: qty 1

## 2019-10-30 MED ORDER — HEPARIN SODIUM (PORCINE) 1000 UNIT/ML IJ SOLN
INTRAMUSCULAR | Status: DC | PRN
Start: 2019-10-30 — End: 2019-10-30
  Administered 2019-10-30: 10000 [IU] via INTRAVENOUS
  Administered 2019-10-30: 1000 [IU] via INTRAVENOUS
  Administered 2019-10-30: 2000 [IU] via INTRAVENOUS

## 2019-10-30 MED ORDER — BISACODYL 10 MG RE SUPP: 10.0000 mg | Freq: Every day | RECTAL | Status: DC | PRN

## 2019-10-30 MED ORDER — HYDRALAZINE HCL 20 MG/ML IJ SOLN: 5.0000 mg | INTRAMUSCULAR | Status: DC | PRN

## 2019-10-30 MED ORDER — DOCUSATE SODIUM 100 MG PO CAPS
100.0000 mg | ORAL_CAPSULE | Freq: Every day | ORAL | Status: DC
  Administered 2019-11-01 – 2019-11-16 (×13): 100 mg via ORAL
  Filled 2019-10-30 (×15): qty 1

## 2019-10-30 SURGICAL SUPPLY — 69 items
ADH SKN CLS APL DERMABOND .7 (GAUZE/BANDAGES/DRESSINGS) ×2
BALLN MUSTANG 5.0X40 135 (BALLOONS) ×3
BALLOON MUSTANG 5.0X40 135 (BALLOONS) IMPLANT
BANDAGE ESMARK 6X9 LF (GAUZE/BANDAGES/DRESSINGS) IMPLANT
BNDG CMPR 9X6 STRL LF SNTH (GAUZE/BANDAGES/DRESSINGS) ×2
BNDG ESMARK 6X9 LF (GAUZE/BANDAGES/DRESSINGS) ×3
CANISTER SUCT 3000ML PPV (MISCELLANEOUS) ×3 IMPLANT
CANNULA VESSEL 3MM 2 BLNT TIP (CANNULA) ×3 IMPLANT
CATH ANGIO 5F BER2 100CM (CATHETERS) ×1 IMPLANT
CATH EMB 4FR 40CM (CATHETERS) ×2 IMPLANT
CATH OMNI FLUSH .035X70CM (CATHETERS) ×1 IMPLANT
CLIP VESOCCLUDE MED 24/CT (CLIP) ×3 IMPLANT
CLIP VESOCCLUDE SM WIDE 24/CT (CLIP) ×3 IMPLANT
CUFF TOURN SGL QUICK 34 (TOURNIQUET CUFF) ×3
CUFF TRNQT CYL 34X4.125X (TOURNIQUET CUFF) IMPLANT
DCB RANGER 5.0X40 135 (BALLOONS) IMPLANT
DERMABOND ADVANCED (GAUZE/BANDAGES/DRESSINGS) ×1
DERMABOND ADVANCED .7 DNX12 (GAUZE/BANDAGES/DRESSINGS) ×2 IMPLANT
ELECT REM PT RETURN 9FT ADLT (ELECTROSURGICAL) ×3
ELECTRODE REM PT RTRN 9FT ADLT (ELECTROSURGICAL) ×2 IMPLANT
GLIDEWIRE ADV .035X260CM (WIRE) ×1 IMPLANT
GLOVE BIO SURGEON STRL SZ 6.5 (GLOVE) ×1 IMPLANT
GLOVE BIOGEL PI IND STRL 7.5 (GLOVE) ×2 IMPLANT
GLOVE BIOGEL PI INDICATOR 7.5 (GLOVE) ×1
GLOVE SURG SS PI 7.5 STRL IVOR (GLOVE) ×3 IMPLANT
GOWN STRL REUS W/ TWL LRG LVL3 (GOWN DISPOSABLE) ×4 IMPLANT
GOWN STRL REUS W/ TWL XL LVL3 (GOWN DISPOSABLE) ×2 IMPLANT
GOWN STRL REUS W/TWL LRG LVL3 (GOWN DISPOSABLE) ×6
GOWN STRL REUS W/TWL XL LVL3 (GOWN DISPOSABLE) ×3
GUIDEWIRE BENTSON (WIRE) ×1 IMPLANT
HEMOSTAT SNOW SURGICEL 2X4 (HEMOSTASIS) IMPLANT
KIT BASIN OR (CUSTOM PROCEDURE TRAY) ×3 IMPLANT
KIT DRSG PREVENA PLUS 7DAY 125 (MISCELLANEOUS) ×1 IMPLANT
KIT ENCORE 26 ADVANTAGE (KITS) ×1 IMPLANT
KIT PREVENA INCISION MGT 13 (CANNISTER) ×1 IMPLANT
KIT TURNOVER KIT B (KITS) ×3 IMPLANT
MARKER GRAFT CORONARY BYPASS (MISCELLANEOUS) IMPLANT
NS IRRIG 1000ML POUR BTL (IV SOLUTION) ×6 IMPLANT
PACK PERIPHERAL VASCULAR (CUSTOM PROCEDURE TRAY) ×3 IMPLANT
PAD ARMBOARD 7.5X6 YLW CONV (MISCELLANEOUS) ×6 IMPLANT
POWDER SURGICEL 3.0 GRAM (HEMOSTASIS) ×1 IMPLANT
RANGER DCB 5.0X40 135 (BALLOONS)
SET COLLECT BLD 21X3/4 12 (NEEDLE) IMPLANT
SET MICROPUNCTURE 5F STIFF (MISCELLANEOUS) ×1 IMPLANT
SHEATH PINNACLE ST 6F 45CM (SHEATH) ×1 IMPLANT
SPONGE LAP 18X18 RF (DISPOSABLE) ×1 IMPLANT
STOPCOCK 4 WAY LG BORE MALE ST (IV SETS) ×1 IMPLANT
SUT ETHILON 3 0 PS 1 (SUTURE) IMPLANT
SUT GORETEX 6.0 TT13 (SUTURE) IMPLANT
SUT GORETEX 6.0 TT9 (SUTURE) IMPLANT
SUT PROLENE 5 0 C 1 24 (SUTURE) ×7 IMPLANT
SUT PROLENE 6 0 BV (SUTURE) ×3 IMPLANT
SUT PROLENE 7 0 BV 1 (SUTURE) ×1 IMPLANT
SUT PROLENE 7 0 BV1 MDA (SUTURE) ×1 IMPLANT
SUT SILK 2 0 SH (SUTURE) ×4 IMPLANT
SUT SILK 3 0 (SUTURE) ×12
SUT SILK 3-0 18XBRD TIE 12 (SUTURE) IMPLANT
SUT VIC AB 2-0 CT1 27 (SUTURE) ×12
SUT VIC AB 2-0 CT1 TAPERPNT 27 (SUTURE) ×4 IMPLANT
SUT VIC AB 3-0 SH 27 (SUTURE) ×12
SUT VIC AB 3-0 SH 27X BRD (SUTURE) ×4 IMPLANT
SUT VICRYL 4-0 PS2 18IN ABS (SUTURE) ×8 IMPLANT
TAPE VIPERTRACK RADIOPAQ 30X (MISCELLANEOUS) IMPLANT
TAPE VIPERTRACK RADIOPAQUE (MISCELLANEOUS) ×3
TOWEL GREEN STERILE (TOWEL DISPOSABLE) ×3 IMPLANT
TRAY FOLEY MTR SLVR 16FR STAT (SET/KITS/TRAYS/PACK) ×3 IMPLANT
UNDERPAD 30X36 HEAVY ABSORB (UNDERPADS AND DIAPERS) ×3 IMPLANT
WATER STERILE IRR 1000ML POUR (IV SOLUTION) ×3 IMPLANT
WIRE SPARTACORE .014X300CM (WIRE) ×1 IMPLANT

## 2019-10-30 NOTE — Op Note (Signed)
Patient name: [name] [name] MRN: [id number] DOB: 1955-12-13 Sex: female  10/30/2019 Pre-operative Diagnosis: Left leg ulcer Post-operative diagnosis:  Same Surgeon:  [name] Assistants: Risa Grill, PA Procedure:   #1: Left femoral to below-knee popliteal artery bypass with ipsilateral nonreversed saphenous vein   #2: Left external iliac, common femoral, profundofemoral endarterectomy   #3: Right lower extremity angiogram   #4: Balloon angioplasty, right femoral-popliteal bypass   #5: Left groin Prevena wound VAC Anesthesia: General Blood Loss: 500 cc Specimens: None  Findings: Extensive calcific plaque within the left external iliac common femoral and profundofemoral artery which necessitated endarterectomy.  She had an excellent vein measuring 4-5 mm.  The distal anastomosis was to the below-knee popliteal artery which was calcified without stenosis.  The right femoral-popliteal bypass graft had 2 areas of stenosis which correlated with venous valves.  These were angioplastied with a 5 mm balloon  Indications: The patient has developed extensive ulceration to the left leg.  Arteriogram yesterday showed a stenotic common femoral artery and occluded superficial femoral artery with reconstitution of the popliteal artery.  She was also found to have bypass graft stenosis on the right.  She comes in today for surgical repair.  Procedure:  The patient was identified in the holding area and taken to [place] [facility]  The patient was then placed supine on the table. general anesthesia was administered.  The patient was prepped and draped in the usual sterile fashion.  A time out was called and antibiotics were administered.  A PA was necessary to expedite the procedure as well as assist with suction, retraction, suture closure, and other technical details.  I first mapped out the left saphenous vein with ultrasound.  This was a healthy appearing vein measuring 4 to 5 mm.  A longitudinal  incision was made in the left groin.  Cautery was used about subtenons tissue down to the femoral sheath which was opened sharply.  I exposed the common femoral artery from the inguinal ligament down to the bifurcation.  The profundofemoral and superficial femoral artery were individually isolated.  I then identified the saphenofemoral junction and then proceeded to dissect out the saphenous vein.  Skip incisions were made throughout the lower leg to harvest the vein.  Side branches were ligated between silk ties.  Using the same incision below the knee after fully exposing the saphenous vein, I entered the popliteal space and dissected out the below-knee popliteal artery.  I did take down some of the soleal attachments to the tibia.  The anterior tibial vein was not divided.  There was a soft spot on the below-knee popliteal artery anteriorly in the proximal portion however distally it was heavily calcified.  At this point I removed the saphenous vein by ligating it distally with a 2-0 silk tie.  The saphenofemoral junction was ligated and oversewn with 5-0 Prolene in 2 layers.  I then created a subsartorial tunnel with a curved Gore tunneler.  At this point the patient was fully heparinized.  After the heparin circulated, I cannulated the left common femoral artery with a micropuncture needle.  A 018 wire was advanced followed by micropuncture sheath.  A Bentson wire was inserted, and a 6 French 45 cm sheath was placed.  Using an Omni Flush catheter and a Glidewire advantage the aortic bifurcation was crossed.  The sheath was then advanced over the bifurcation into the right femoral-popliteal bypass graft.  Contrast injections were then performed to the sheath which  located 2 areas of stenosis which look like the correlated with venous valves.  The Glidewire advantage wire was advanced across these areas.  The stenosis within each valve was greater than 50%.  I used a 5 x 40 Mustang balloon to perform balloon  angioplasty of both areas.  Follow-up imaging revealed resolution of the stenosis, now less than 10%.  I then removed the sheath and occluded the common femoral artery by securing the vessel loop.  The profundofemoral artery was occluded with a baby Gregory clamp.  I then made a longitudinal arteriotomy in the common femoral artery with Potts scissors.  This went down onto the origin of the profundofemoral artery for approximately 4 mm.  I then performed endarterectomy with a Marlene Bast elevator, removing the plaque within the common femoral artery and the proximal profundofemoral artery.  I then inserted a #4 Fogarty balloon catheter into the left external iliac artery and used a hemostat to remove plaque up into the distal external iliac artery.  At this point, I had excellent inflow.  The vein was then brought onto the table.  It was prepared and distended to 5 to 6 mm throughout.  The proximal portion of the vein was spatulated to fit the size of the arteriotomy and a running anastomosis was created with 5-0 Prolene.  Clamps were released.  I then used a Mills valvulotome to lyse the valves.  2 passes were performed.  There was excellent pulsatile flow through the vein graft.  The vein graft was marked for orientation and then brought through the previously created tunnel making sure to maintain proper orientation.  A tourniquet was placed on the upper thigh and the leg was exsanguinated with an Esmarch.  The tourniquet was taken to 250 mm of pressure.  I then opened the below-knee popliteal artery with an 11 blade and extended longitudinally with Potts scissors.  Distally I opened the heavily calcified area on the distal below-knee popliteal artery.  This was not stenotic.  I then straighten the leg and cut the vein graft to the appropriate length and spatulated to fit the size of the arteriotomy.  A running anastomosis was then created with 6-0 Prolene.  Prior to completion the tourniquet was let down and  the appropriate flushing maneuvers were performed.  The anastomosis was then secured.  The patient then had graft dependent Doppler signals in the posterior tibial and peroneal artery.  The patient's heparin was then reversed with 50 mg of protamine.  The wounds were irrigated.  Once hemostasis was satisfactory, the vein incisions were closed with 2 layers of 3-0 Vicryl.  The below-knee incision was closed by reapproximating the fascia with 2-0 Vicryl the subcutaneous tissue with 3-0 Vicryl and the skin with 4-0 Vicryl.  The groin was closed by reapproximating the femoral sheath with 2-0 Vicryl.  The subcutaneous tissue was closed with 2-0 Vicryl and the skin was closed with 3-0 Vicryl.  A Prevena wound VAC was placed on the groin incision.  Dermabond was placed on the other incisions.  The patient was successfully extubated and taken recovery in stable condition.  There were no immediate complications.   Disposition: To PACU stable.   Theotis Burrow, M.D., [facility] [place] Office: [phone] Pager:  [phone]

## 2019-10-30 NOTE — Anesthesia Procedure Notes (Signed)
Procedure Name: Intubation Date/Time: 10/30/2019 8:25 AM Performed by: Alain Marion, CRNA Pre-anesthesia Checklist: Patient identified, Emergency Drugs available, Suction available and Patient being monitored Patient Re-evaluated:Patient Re-evaluated prior to induction Oxygen Delivery Method: Circle System Utilized Preoxygenation: Pre-oxygenation with 100% oxygen Induction Type: IV induction Ventilation: Mask ventilation without difficulty and Oral airway inserted - appropriate to patient size Laryngoscope Size: Mac and 3 Grade View: Grade I Tube type: Oral Tube size: 7.5 mm Number of attempts: 1 Airway Equipment and Method: Stylet and Oral airway Placement Confirmation: ETT inserted through vocal cords under direct vision,  positive ETCO2 and breath sounds checked- equal and bilateral Secured at: 22 cm Tube secured with: Tape Dental Injury: Teeth and Oropharynx as per pre-operative assessment  Comments: Inserted by Edmund Hilda, SRNA under direct supervision.

## 2019-10-30 NOTE — Progress Notes (Signed)
  Day of Surgery Note    Subjective:  Resting with some post-op pain in PACU   Vitals:   10/30/19 0704  BP: (!) 152/52  Pulse: 94  Resp: 18  Temp: 97.7 F (36.5 C)  SpO2: 94%    Incisions:   Prevena in place on left groin incision. No bleeding. Thigh and lower leg incisions well approximated without bleeding or hematoma Extremities:  Moves left foot on command. Warm to touch with some duskiness to toes.  Monophasic Doppler signals of AT and PT> Venous signals are prominent, but I can hear pulsitile flow c/w HR. Cardiac:  RRR (HR=106) Lungs:  Non-labored   Assessment/Plan:  This is a 64 y.o. female who is s/p left femoral to BK popliteal bypass with GS graft. -To 4E when bed available. Allergy to PCN and received 1 gram of Vancomycin pre-op.  Discussed with pharmacist>>will dose additional 1.5g twelve hours after first dose  Risa Grill, PA-C 10/30/2019 4:19 PM [phone]

## 2019-10-30 NOTE — Anesthesia Procedure Notes (Signed)
Arterial Line Insertion Start/End8/01/2020 8:20 AM Performed by: Alain Marion, CRNA, CRNA  Preanesthetic checklist: patient identified, IV checked, site marked, risks and benefits discussed, surgical consent, monitors and equipment checked, pre-op evaluation, timeout performed and anesthesia consent Lidocaine 1% used for infiltration Right, radial was placed Catheter size: 20 G Hand hygiene performed  and maximum sterile barriers used   Attempts: 1 Procedure performed without using ultrasound guided technique. Following insertion, dressing applied and Biopatch. Post procedure assessment: normal and unchanged  Patient tolerated the procedure well with no immediate complications.

## 2019-10-30 NOTE — Anesthesia Preprocedure Evaluation (Addendum)
Anesthesia Evaluation  Patient identified by MRN, date of birth, ID band Patient awake    Reviewed: Allergy & Precautions, NPO status , Patient's Chart, lab work & pertinent test results  Airway Mallampati: I  TM Distance: >3 FB Neck ROM: Full    Dental  (+) Edentulous Upper, Edentulous Lower   Pulmonary asthma , COPD,  COPD inhaler, Current Smoker,     + decreased breath sounds      Cardiovascular hypertension, Pt. on medications + Peripheral Vascular Disease   Rhythm:Regular Rate:Normal     Neuro/Psych PSYCHIATRIC DISORDERS Depression    GI/Hepatic negative GI ROS, Neg liver ROS,   Endo/Other  diabetes, Type 2, Insulin Dependent, Oral Hypoglycemic Agents  Renal/GU negative Renal ROS     Musculoskeletal   Abdominal   Peds  Hematology   Anesthesia Other Findings Coarse breath sounds improved with cough.   Reproductive/Obstetrics                            Anesthesia Physical Anesthesia Plan  ASA: III  Anesthesia Plan: General   Post-op Pain Management:    Induction: Intravenous  PONV Risk Score and Plan: 3 and Ondansetron, Dexamethasone and Midazolam  Airway Management Planned: Oral ETT  Additional Equipment: Arterial line  Intra-op Plan:   Post-operative Plan: Extubation in OR  Informed Consent:     Dental advisory given  Plan Discussed with: CRNA  Anesthesia Plan Comments: (Breathing treatment in preop.  Myocardial View:   Nuclear stress EF: 58%.  There was no ST segment deviation noted during stress.  Defect 1: There is a small, fixed defect of moderate severity present in the apical inferior and apical lateral location. This could be consistent with prior infarct. However, given normal wall motion, favor artifact.  The left ventricular ejection fraction is normal (55-65%).  This is a low risk study.   Echo:  - Left ventricle: The cavity size was normal.  Wall thickness was  increased in a pattern of moderate LVH. Systolic function was  vigorous. The estimated ejection fraction was in the range of 65%  to 70%. Wall motion was normal; there were no regional wall  motion abnormalities. Doppler parameters are consistent with  abnormal left ventricular relaxation (grade 1 diastolic  dysfunction). The E/e&' ratio is between 8-15, suggesting  indeterminate LV filling pressure.  - Aortic valve: Calcified leaflets with very mild aortic stenosis.  Mean gradient (S): 11 mm Hg. Peak gradient (S): 21 mm Hg. Valve  area (VTI): 2.1 cm^2. Valve area (Vmax): 2.25 cm^2. Valve area  (Vmean): 2.19 cm^2.  - Aorta: Ascending aortic diameter: 39 mm (S).  - Ascending aorta: The ascending aorta was mildly dilated.  - Mitral valve: Mildly thickened leaflets . There was trivial  regurgitation.  - Left atrium: The atrium was normal in size.  - Inferior vena cava: The vessel was normal in size. The  respirophasic diameter changes were in the normal range (>= 50%),  consistent with normal central venous pressure. )      Anesthesia Quick Evaluation

## 2019-10-30 NOTE — Progress Notes (Signed)
Nurse unable to find pulse with doppler. PA Notified  PA Katharine Look assessed the patient left leg with a doppler. PA indicated that graft was very deep but she felt she could hear a pulse over the sound of venous blood flow. Nurse will continue to assesses for improved blood flow during PACU stay.

## 2019-10-30 NOTE — Transfer of Care (Signed)
Immediate Anesthesia Transfer of Care Note  Patient: [name] [name]  Procedure(s) Performed: BYPASS GRAFT FEMORAL-POPLITEAL ARTERY (Left Leg Upper) RIGHT LEG BALLOON  ANGIOPLASTY SFA (Right Leg Upper)  Patient Location: PACU  Anesthesia Type:General  Level of Consciousness: drowsy, patient cooperative and responds to stimulation  Airway & Oxygen Therapy: Patient Spontanous Breathing and Patient connected to face mask oxygen  Post-op Assessment: Report given to RN, Post -op Vital signs reviewed and stable and Patient moving all extremities X 4  Post vital signs: Reviewed and stable  Last Vitals:  Vitals Value Taken Time  BP 129/77 10/30/19 1546  Temp    Pulse 106 10/30/19 1557  Resp 32 10/30/19 1557  SpO2 100 % 10/30/19 1557  Vitals shown include unvalidated device data.  Last Pain:  Vitals:   10/30/19 0735  TempSrc:   PainSc: 8       Patients Stated Pain Goal: 6 (20/72/18 2883)  Complications: No complications documented.

## 2019-10-30 NOTE — Interval H&P Note (Signed)
History and Physical Interval Note:  10/30/2019 8:36 PM  [name] [name]  has presented today for surgery, with the diagnosis of LEFT LEG ULCER, OCCLUSION OF RIGHT FEMORAL-POPLITEAL BYPASS GRAFT.  The various methods of treatment have been discussed with the patient and family. After consideration of risks, benefits and other options for treatment, the patient has consented to  Procedure(s): BYPASS GRAFT FEMORAL-POPLITEAL ARTERY (Left) RIGHT LEG BALLOON  ANGIOPLASTY SFA (Right) as a surgical intervention.  The patient's history has been reviewed, patient examined, no change in status, stable for surgery.  I have reviewed the patient's chart and labs.  Questions were answered to the patient's satisfaction.     [name]

## 2019-10-31 ENCOUNTER — Encounter (HOSPITAL_COMMUNITY): Payer: Self-pay | Admitting: Surgery

## 2019-10-31 LAB — GLUCOSE, CAPILLARY
Glucose-Capillary: 174 mg/dL — ABNORMAL HIGH (ref 70–99)
Glucose-Capillary: 202 mg/dL — ABNORMAL HIGH (ref 70–99)
Glucose-Capillary: 208 mg/dL — ABNORMAL HIGH (ref 70–99)
Glucose-Capillary: 161 mg/dL — ABNORMAL HIGH (ref 70–99)

## 2019-10-31 LAB — LIPID PANEL
Cholesterol: 79 mg/dL (ref 0–200)
HDL: 26 mg/dL — ABNORMAL LOW (ref >40)
LDL Cholesterol: 38 mg/dL (ref 0–99)
Total CHOL/HDL Ratio: 3 RATIO
Triglycerides: 73 mg/dL (ref <150)
VLDL: 15 mg/dL (ref 0–40)

## 2019-10-31 LAB — BASIC METABOLIC PANEL
Anion gap: 7 (ref 5–15)
BUN: 12 mg/dL (ref 8–23)
CO2: 27 mmol/L (ref 22–32)
Calcium: 8.1 mg/dL — ABNORMAL LOW (ref 8.9–10.3)
Chloride: 100 mmol/L (ref 98–111)
Creatinine, Ser: 0.6 mg/dL (ref 0.44–1.00)
GFR calc Af Amer: >60 mL/min (ref >60)
GFR calc non Af Amer: >60 mL/min (ref >60)
Glucose, Bld: 159 mg/dL — ABNORMAL HIGH (ref 70–99)
Potassium: 4.1 mmol/L (ref 3.5–5.1)
Sodium: 134 mmol/L — ABNORMAL LOW (ref 135–145)

## 2019-10-31 LAB — CBC
HCT: 24.7 % — ABNORMAL LOW (ref 36.0–46.0)
Hemoglobin: 7.4 g/dL — ABNORMAL LOW (ref 12.0–15.0)
MCH: 25.4 pg — ABNORMAL LOW (ref 26.0–34.0)
MCHC: 30 g/dL (ref 30.0–36.0)
MCV: 84.9 fL (ref 80.0–100.0)
Platelets: 114 10*3/uL — ABNORMAL LOW (ref 150–400)
RBC: 2.91 MIL/uL — ABNORMAL LOW (ref 3.87–5.11)
RDW: 15.8 % — ABNORMAL HIGH (ref 11.5–15.5)
WBC: 13 10*3/uL — ABNORMAL HIGH (ref 4.0–10.5)
nRBC: 0 % (ref 0.0–0.2)

## 2019-10-31 LAB — URINALYSIS, ROUTINE W REFLEX MICROSCOPIC
Bacteria, UA: NONE SEEN
Bilirubin Urine: NEGATIVE
Glucose, UA: NEGATIVE mg/dL
Ketones, ur: NEGATIVE mg/dL
Nitrite: NEGATIVE
Protein, ur: NEGATIVE mg/dL
Specific Gravity, Urine: 1.021 (ref 1.005–1.030)
pH: 6 (ref 5.0–8.0)

## 2019-10-31 MED ORDER — CLOPIDOGREL BISULFATE 75 MG PO TABS
75.0000 mg | ORAL_TABLET | Freq: Every day | ORAL | Status: DC
  Administered 2019-10-31 – 2019-11-16 (×17): 75 mg via ORAL
  Filled 2019-10-31 (×17): qty 1

## 2019-10-31 NOTE — Progress Notes (Addendum)
Inpatient Diabetes Program Recommendations  AACE/ADA: New Consensus Statement on Inpatient Glycemic Control (2015)  Target Ranges:  Prepandial:   less than 140 mg/dL      Peak postprandial:   less than 180 mg/dL (1-2 hours)      Critically ill patients:  140 - 180 mg/dL   Lab Results  Component Value Date   GLUCAP 202 (H) 10/31/2019   HGBA1C 8.0 (H) 01/28/2013    Review of Glycemic Control Results for [name] [name], [name] [name] (MRN [id number]) as of 10/31/2019 11:56  Ref. Range 10/30/2019 07:10 10/30/2019 14:14 10/30/2019 21:25 10/31/2019 06:13 10/31/2019 11:33  Glucose-Capillary Latest Ref Range: 70 - 99 mg/dL 161 (H) 232 (H) 227 (H) 174 (H) 202 (H)   Diabetes history:  DM2 Outpatient Diabetes medications:  70/30 75 units BID + Metformin 500 mg BID  Current orders for Inpatient glycemic control:  70/30 75 units BID  Inpatient Diabetes Program Recommendations:    Novolog 0-9 unit TID  Current A1C  Will continue to follow while inpatient.  Thank you, Reche Dixon, RN, BSN Diabetes Coordinator Inpatient Diabetes Program [phone] (team pager from 8a-5p)

## 2019-10-31 NOTE — Evaluation (Signed)
Physical Therapy Evaluation Patient Details Name: [name] [name] MRN: [id number] DOB: 09/25/1955 Today's Date: 10/31/2019   History of Present Illness  64 y.o. female presenting with L leg ulcer, occlusion of R femoral-popliteal bypass graft s/p bypass graft femoral-popliteal artery (L) and R leg balloon angioplasty SFA (R). PMHx significant for R femoral-popliteal bypass graf on 04/07/2017, L carotid endartectomy on 06/30/2017, COPD, depression, DM, HLD, HTN, obesity, and tobacco use.   Clinical Impression  Pt was seen for minimal walking on side of bed, with care to avoid overdoing.  Pt is expecting to go home with family to help and will expect this as well, but do not recommend if she continues to have incision issues.  Follow acutely as tolerated for gait and transfers.    Follow Up Recommendations Home health PT;Supervision for mobility/OOB    Equipment Recommendations  Rolling walker with 5" wheels    Recommendations for Other Services       Precautions / Restrictions Precautions Precautions: Fall Precaution Comments: wound vac and mointor vitals Restrictions Weight Bearing Restrictions: Yes RLE Weight Bearing: Weight bearing as tolerated LLE Weight Bearing: Weight bearing as tolerated      Mobility  Bed Mobility Overal bed mobility: Needs Assistance Bed Mobility: Supine to Sit     Supine to sit: Min assist        Transfers Overall transfer level: Needs assistance Equipment used: Rolling walker (2 wheeled) Transfers: Sit to/from Stand Sit to Stand: Min assist         General transfer comment: min assist to support her efforts  Ambulation/Gait Ambulation/Gait assistance: Min guard;Min assist Gait Distance (Feet): 6 Feet Assistive device: Rolling walker (2 wheeled);1 person hand held assist Gait Pattern/deviations: Step-to pattern;Narrow base of support;Decreased stride length Gait velocity: reduced Gait velocity interpretation: <1.31 ft/sec, indicative of  household ambulator General Gait Details: sidesteps on side of bed  Stairs            Wheelchair Mobility    Modified Rankin (Stroke Patients Only)       Balance Overall balance assessment: Needs assistance Sitting-balance support: Feet supported Sitting balance-Leahy Scale: Good     Standing balance support: Bilateral upper extremity supported Standing balance-Leahy Scale: Fair Standing balance comment: fair with UE support on RW                             Pertinent Vitals/Pain Pain Assessment: Faces Faces Pain Scale: Hurts a little bit Pain Location: LLE Pain Descriptors / Indicators: Guarding Pain Intervention(s): Premedicated before session;Repositioned    Home Living Family/patient expects to be discharged to:: Private residence Living Arrangements: Children Available Help at Discharge: Family;Available 24 hours/day Type of Home: Apartment Home Access: Ramped entrance     Home Layout: One level Home Equipment: Walker - 2 wheels;Bedside commode      Prior Function Level of Independence: Independent with assistive device(s)         Comments: I with her own care     Hand Dominance   Dominant Hand: Right    Extremity/Trunk Assessment   Upper Extremity Assessment Upper Extremity Assessment: Defer to OT evaluation    Lower Extremity Assessment Lower Extremity Assessment: Generalized weakness    Cervical / Trunk Assessment Cervical / Trunk Assessment: Kyphotic  Communication   Communication: No difficulties  Cognition Arousal/Alertness: Awake/alert Behavior During Therapy: WFL for tasks assessed/performed Overall Cognitive Status: Within Functional Limits for tasks assessed  General Comments General comments (skin integrity, edema, etc.): pt is tired from earlier seeing OT and general hosp staff arrivals for her care    Exercises     Assessment/Plan    PT Assessment  Patient needs continued PT services  PT Problem List Decreased strength;Decreased range of motion;Decreased activity tolerance;Decreased balance;Decreased mobility;Decreased coordination;Decreased safety awareness;Decreased knowledge of use of DME;Cardiopulmonary status limiting activity;Decreased skin integrity;Pain       PT Treatment Interventions DME instruction;Gait training;Stair training;Functional mobility training;Therapeutic activities;Therapeutic exercise;Balance training;Neuromuscular re-education;Patient/family education    PT Goals (Current goals can be found in the Care Plan section)  Acute Rehab PT Goals Patient Stated Goal: To return home PT Goal Formulation: With patient Time For Goal Achievement: 11/07/19 Potential to Achieve Goals: Good    Frequency Min 4X/week   Barriers to discharge Inaccessible home environment;Decreased caregiver support home alone with family able to help    Co-evaluation               AM-PAC PT "6 Clicks" Mobility  Outcome Measure Help needed turning from your back to your side while in a flat bed without using bedrails?: A Little Help needed moving from lying on your back to sitting on the side of a flat bed without using bedrails?: A Little Help needed moving to and from a bed to a chair (including a wheelchair)?: A Little Help needed standing up from a chair using your arms (e.g., wheelchair or bedside chair)?: A Little Help needed to walk in hospital room?: A Little   6 Click Score: 15    End of Session Equipment Utilized During Treatment: Gait belt;Oxygen Activity Tolerance: Patient tolerated treatment well;Treatment limited secondary to medical complications (Comment) Patient left: in bed;with call bell/phone within reach Nurse Communication: Mobility status PT Visit Diagnosis: Unsteadiness on feet (R26.81);Muscle weakness (generalized) (M62.81)    Time: 4944-9675 PT Time Calculation (min) (ACUTE ONLY): 31  min   Charges:   PT Evaluation $PT Eval Moderate Complexity: 1 Mod PT Treatments $Gait Training: 8-22 mins       Ramond Dial 10/31/2019, 10:03 PM  Mee Hives, PT MS Acute Rehab Dept. Number: [place] [facility] [place]

## 2019-10-31 NOTE — Progress Notes (Signed)
Pt has small amount of blood oozing from upper and middle thigh incision as well as calf incision. Bruising noted at upper thigh/groin area.  Called PA Katharine Look to make aware.  Pt was up in chair and not wanting legs to be elevated.  I assisted pt to bed to assist with compliance with leg elevation. Pt resting with call bell within reach.  Will continue to monitor.

## 2019-10-31 NOTE — Progress Notes (Signed)
Pt refused 73/30 insulin this morning stating that she would not take that at home if she was not eating. Pt stated she will take evening dose because she does not usually eat lunch. Pt resting with call bell within reach.  Will continue to monitor.'

## 2019-10-31 NOTE — Progress Notes (Signed)
PHARMACIST LIPID MONITORING   [name] [name] is a 64 y.o. female admitted on 10/30/2019 with occlusion of R fem-pop bypass graft.  Pharmacy has been consulted to optimize lipid-lowering therapy with the indication of secondary prevention for clinical ASCVD.  Recent Labs:  Lipid Panel (last 6 months):   Lab Results  Component Value Date   CHOL 79 10/31/2019   TRIG 73 10/31/2019   HDL 26 (L) 10/31/2019   CHOLHDL 3.0 10/31/2019   VLDL 15 10/31/2019   LDLCALC 38 10/31/2019    Hepatic function panel (last 6 months):   Lab Results  Component Value Date   AST 25 10/30/2019   ALT 19 10/30/2019   ALKPHOS 70 10/30/2019   BILITOT 0.6 10/30/2019    SCr (since admission):   Serum creatinine: 0.6 mg/dL 10/31/19 0450 Estimated creatinine clearance: 91.3 mL/min  Current lipid-lowering therapy: Atorvastatin 20 mg daily   Assessment: Patient is excluded from the protocol due to LDL being controlled at 38   Recommendation per protocol:  Continue current lipid-lowering therapy.  Follow-up labs after discharge:    Liver function panel and lipid panel in 8-12 weeks then annually  Plan: Continue current lipid-lowering therapy Pharmacy to sign off   Albertina Parr, PharmD., BCPS, BCCCP Clinical Pharmacist Clinical phone for 10/31/19 until 3:30pm: [phone] If after 3:30pm, please refer to [facility] for unit-specific pharmacist

## 2019-10-31 NOTE — Evaluation (Signed)
Occupational Therapy Evaluation Patient Details Name: [name] [name] MRN: [id number] DOB: 1955-03-22 Today's Date: 10/31/2019    History of Present Illness 64 y.o. female presenting with L leg ulcer, occlusion of R femoral-popliteal bypass graft s/p bypass graft femoral-popliteal artery (L) and R leg balloon angioplasty SFA (R). PMHx significant for R femoral-popliteal bypass graf on 04/07/2017, L carotid endartectomy on 06/30/2017, COPD, depression, DM, HLD, HTN, obesity, and tobacco use.    Clinical Impression   PTA patient was living in a 1st floor apartment with level entry and was independent with BADLs/IADLs at baseline including meal prep, homemaking tasks and driving. Patient currently presents below baseline level of function requiring Min A grossly for functional transfers and mobility with use of RW, set-up assist grossly for UB BADLs, and Max A grossly for LB BADLs. Patient would benefit from continued acute OT services to increase safety and independence with self-care tasks in prep for safe d/c home with recommendation for HHOT. Patient reports ability to have 24hr assist from drt, gtr, and sister upon d/c.     Follow Up Recommendations  Home health OT;Supervision/Assistance - 24 hour    Equipment Recommendations  3 in 1 bedside commode    Recommendations for Other Services       Precautions / Restrictions Precautions Precautions: Fall Precaution Comments: Wound vac, Monitor vitals Restrictions Weight Bearing Restrictions: Yes RLE Weight Bearing: Weight bearing as tolerated LLE Weight Bearing: Weight bearing as tolerated      Mobility Bed Mobility Overal bed mobility: Needs Assistance Bed Mobility: Sit to Supine       Sit to supine: Min assist;HOB elevated   General bed mobility comments: To advance LLE toward EOB with increased time/effort and use of bed rail.   Transfers Overall transfer level: Needs assistance Equipment used: Rolling walker (2  wheeled) Transfers: Sit to/from [facility] Sit to Stand: Min assist Stand pivot transfers: Min assist       General transfer comment: Min A for sit to stand from elevated EOB and Min A for stand-pivot transfer to recliner.     Balance Overall balance assessment: Needs assistance Sitting-balance support: No upper extremity supported;Feet supported Sitting balance-Leahy Scale: Good     Standing balance support: Bilateral upper extremity supported Standing balance-Leahy Scale: Fair Standing balance comment: At least unilateral UE support on RW                           ADL either performed or assessed with clinical judgement   ADL Overall ADL's : Needs assistance/impaired     Grooming: Set up;Sitting;Brushing hair Grooming Details (indicate cue type and reason): Patient completed 1/3 grooming tasks seated EOB with set-up assist.              Lower Body Dressing: Moderate assistance;Sitting/lateral leans;Sit to/from stand Lower Body Dressing Details (indicate cue type and reason): Max A to don footwear seated EOB.  Toilet Transfer: Minimal Insurance claims handler Details (indicate cue type and reason): Simulated with stand-pivot transfer to recliner            General ADL Comments: Patient on 3L via [place] upon entry. Seated EOB on RA desat to 87%. Placed patient on 1.5L with SpO2 8-91% but questionable accuracy.      Vision Baseline Vision/History: Wears glasses Wears Glasses: Reading only Patient Visual Report: No change from baseline Vision Assessment?: No apparent visual deficits     Perception     Praxis  Pertinent Vitals/Pain Pain Assessment: 0-10 Pain Score: 3  Pain Location: LLE Pain Descriptors / Indicators: Aching Pain Intervention(s): Monitored during session;Premedicated before session     Hand Dominance Right   Extremity/Trunk Assessment Upper Extremity Assessment Upper Extremity Assessment: RUE  deficits/detail;LUE deficits/detail RUE Deficits / Details: AROM WFL RUE Sensation: history of peripheral neuropathy LUE Deficits / Details: AROM WFL  LUE Sensation: history of peripheral neuropathy   Lower Extremity Assessment Lower Extremity Assessment: Defer to PT evaluation       Communication Communication Communication: No difficulties   Cognition Arousal/Alertness: Awake/alert Behavior During Therapy: WFL for tasks assessed/performed Overall Cognitive Status: Within Functional Limits for tasks assessed                                     General Comments  Bleeding at incision on L thigh. RN aware.     Exercises     Shoulder Instructions      Home Living Family/patient expects to be discharged to:: Private residence Living Arrangements: Children Available Help at Discharge: Family;Available 24 hours/day (Daughter works 3 days a week. Gtr and sister avaiable PRN. ) Type of Home: Apartment Home Access: Ramped entrance     Home Layout: One level     Bathroom Shower/Tub: Teacher, early years/pre: Standard Bathroom Accessibility: Yes How Accessible: Accessible via walker Home Equipment: Walker - 2 wheels;Bedside commode          Prior Functioning/Environment Level of Independence: Independent with assistive device(s)        Comments: Independent with BADLs/IADLs including driving.         OT Problem List: Decreased activity tolerance;Impaired balance (sitting and/or standing);Decreased coordination;Decreased knowledge of use of DME or AE;Pain      OT Treatment/Interventions: Self-care/ADL training;Therapeutic exercise;Energy conservation;DME and/or AE instruction;Therapeutic activities;Patient/family education;Balance training    OT Goals(Current goals can be found in the care plan section) Acute Rehab OT Goals Patient Stated Goal: To return home OT Goal Formulation: With patient Time For Goal Achievement: 11/14/19 Potential  to Achieve Goals: Good ADL Goals Pt Will Perform Lower Body Bathing: with modified independence;with adaptive equipment;sitting/lateral leans;sit to/from stand Pt Will Perform Lower Body Dressing: with modified independence;with adaptive equipment;sitting/lateral leans;sit to/from stand Pt Will Transfer to Toilet: with modified independence;ambulating Pt Will Perform Toileting - Clothing Manipulation and hygiene: with modified independence;sitting/lateral leans;sit to/from stand  OT Frequency: Min 2X/week   Barriers to D/C:            Co-evaluation              AM-PAC OT "6 Clicks" Daily Activity     Outcome Measure Help from another person eating meals?: None Help from another person taking care of personal grooming?: A Little Help from another person toileting, which includes using toliet, bedpan, or urinal?: A Little Help from another person bathing (including washing, rinsing, drying)?: A Lot Help from another person to put on and taking off regular upper body clothing?: A Little Help from another person to put on and taking off regular lower body clothing?: A Lot 6 Click Score: 17   End of Session Equipment Utilized During Treatment: Rolling walker Nurse Communication: Mobility status  Activity Tolerance: Patient tolerated treatment well Patient left: in chair;with call bell/phone within reach;with chair alarm set  OT Visit Diagnosis: Unsteadiness on feet (R26.81);Pain Pain - Right/Left: Left Pain - part of body: Leg  Time: 1855-0158 OT Time Calculation (min): 34 min Charges:  OT General Charges $OT Visit: 1 Visit OT Evaluation $OT Eval Moderate Complexity: 1 Mod OT Treatments $Therapeutic Activity: 8-22 mins  Jason Hauge H. OTR/L Supplemental OT, Department of rehab services [phone]  Adiana Smelcer R H. 10/31/2019, 10:39 AM

## 2019-10-31 NOTE — Anesthesia Postprocedure Evaluation (Signed)
Anesthesia Post Note  Patient: [name] [name]  Procedure(s) Performed: BYPASS GRAFT FEMORAL-POPLITEAL ARTERY (Left Leg Upper) RIGHT LEG BALLOON  ANGIOPLASTY SFA (Right Leg Upper)     Patient location during evaluation: PACU Anesthesia Type: General Level of consciousness: awake and alert Pain management: pain level controlled Vital Signs Assessment: post-procedure vital signs reviewed and stable Respiratory status: spontaneous breathing, nonlabored ventilation, respiratory function stable and patient connected to nasal cannula oxygen Cardiovascular status: blood pressure returned to baseline and stable Postop Assessment: no apparent nausea or vomiting Anesthetic complications: no Comments: Repeat Hgb 8.5, vitals stable.    No complications documented.                Effie Berkshire

## 2019-10-31 NOTE — Progress Notes (Signed)
Mobility Specialist - Progress Note   10/31/19 1439  Mobility  Activity  (cancel)   RN instructed not to see pt for now due to incisional bleeding earlier today.   Pricilla Handler Mobility Specialist Mobility Specialist Phone: [phone]

## 2019-10-31 NOTE — Progress Notes (Addendum)
Progress Note    10/31/2019 7:56 AM 1 Day Post-Op  Subjective:  Pain controlled. Voiding spontaneously. Denies CP, SOB or nausea  Was given NS bolus around 3:30am for soft BP. Vitals:   10/31/19 0500 10/31/19 0700  BP: 114/61 106/88  Pulse: (!) 112 (!) 101  Resp: 18 16  Temp: 97.9 F (36.6 C) 97.9 F (36.6 C)  SpO2: 97% 96%    Physical Exam: Cardiac:  RRR Lungs:  Bilateral rhonchi Incisions:  Prevena over left groin with good seal. LE incisions well approximated without bleeding or hematoma. Extremities:  BLE well perfused. Motor and sensation intact. Right: + peroneal and DP Doppler signals Left: + AT and PR Doppler signals   CBC    Component Value Date/Time   WBC 13.0 (H) 10/31/2019 0450   RBC 2.91 (L) 10/31/2019 0450   HGB 7.4 (L) 10/31/2019 0450   HCT 24.7 (L) 10/31/2019 0450   PLT 114 (L) 10/31/2019 0450   MCV 84.9 10/31/2019 0450   MCH 25.4 (L) 10/31/2019 0450   MCHC 30.0 10/31/2019 0450   RDW 15.8 (H) 10/31/2019 0450   LYMPHSABS 1.6 04/04/2017 1347   MONOABS 0.7 04/04/2017 1347   EOSABS 0.1 04/04/2017 1347   BASOSABS 0.0 04/04/2017 1347    BMET    Component Value Date/Time   NA 134 (L) 10/31/2019 0450   K 4.1 10/31/2019 0450   CL 100 10/31/2019 0450   CO2 27 10/31/2019 0450   GLUCOSE 159 (H) 10/31/2019 0450   BUN 12 10/31/2019 0450   CREATININE 0.60 10/31/2019 0450   CREATININE 0.69 01/28/2013 1110   CALCIUM 8.1 (L) 10/31/2019 0450   GFRNONAA >60 10/31/2019 0450   GFRAA >60 10/31/2019 0450     Intake/Output Summary (Last 24 hours) at 10/31/2019 0756 Last data filed at 10/31/2019 7341 Gross per 24 hour  Intake 5614.66 ml  Output 1890 ml  Net 3724.66 ml    HOSPITAL MEDICATIONS Scheduled Meds: . aspirin EC  81 mg Oral Daily  . atorvastatin  20 mg Oral Daily  . Chlorhexidine Gluconate Cloth  6 each Topical Daily  . docusate sodium  100 mg Oral Daily  . gabapentin  300 mg Oral TID  . hydrochlorothiazide  25 mg Oral Daily  . insulin  aspart protamine- aspart  75 Units Subcutaneous BID WC  . lisinopril  10 mg Oral BID  . pantoprazole  40 mg Oral Daily  . pregabalin  150 mg Oral BID  . sertraline  100 mg Oral Daily   Continuous Infusions: . sodium chloride 125 mL/hr at 10/31/19 0618  . magnesium sulfate bolus IVPB     PRN Meds:.acetaminophen **OR** acetaminophen, albuterol, alum & mag hydroxide-simeth, bisacodyl, guaiFENesin-dextromethorphan, hydrALAZINE, labetalol, magnesium sulfate bolus IVPB, metoprolol tartrate, morphine injection, ondansetron, oxyCODONE-acetaminophen, phenol, potassium chloride, senna-docusate  Assessment: Left fem-BK pop bypass with vein and angioplasty of right fem-pop bypass. Post-op hypotension: continue to hold anti-hypertensive meds and monitor  Acute blood loss anemia: Hgb 7.4   Plan: -Watch BP. May require transfusion. Recheck CBC in am or sooner if BP remains soft    Risa Grill, PA-C [facility] [phone] 10/31/2019  7:56 AM   I agree with the above.  I have seen and evaluated the patient.  She is recovering nicely from her surgery yesterday.  Her incisions are clean dry and intact.  She has good pedal Doppler signals bilaterally.  I will start her back on Plavix today.  I have encouraged her to ambulate in the  hallway today.  [name]

## 2019-10-31 NOTE — Progress Notes (Signed)
Plan of care reviewed. Pt appeared alert and oriented x 4. Left groin with wound vac, seal intact, dressing dry and clean, no drainage presented in canister. Pt had good and strong DP, PT and Popliteal pulses bilaterally detected by doppler.  BP 92/49 -97/ 50 mmHg, (MAP61-64),  HR 110-115, ST on monitor,  SPO2 93-96% on 3 LPM of O2 NCL.  Auscultated lung sound with small rhonchi upper bilaterally. No wheezing. Afebrile, temp 97.8 - 98.0 F orally.   We held her Lisinopril which scheduled at bedtime.  NSS 500 ml bolus  x 2 per order. 1st bolus at 08:56 pm, and  2nd  Bolus at 01:27am After 2 bolus, her BP remained in 91/63-112/53 mmHg, A- line 109/46 -115/50 mmHg.  Concerning Narcotics may drop her BP more. Her pain tolerated well with Tylenol, Gabapentin, and Lyrica. She's able to sleep well. We will continue to monitor.  Kennyth Lose, RN

## 2019-11-01 ENCOUNTER — Encounter (HOSPITAL_COMMUNITY): Payer: Medicare Other

## 2019-11-01 LAB — CBC
HCT: 23.6 % — ABNORMAL LOW (ref 36.0–46.0)
Hemoglobin: 7.2 g/dL — ABNORMAL LOW (ref 12.0–15.0)
MCH: 26 pg (ref 26.0–34.0)
MCHC: 30.5 g/dL (ref 30.0–36.0)
MCV: 85.2 fL (ref 80.0–100.0)
Platelets: 121 10*3/uL — ABNORMAL LOW (ref 150–400)
RBC: 2.77 MIL/uL — ABNORMAL LOW (ref 3.87–5.11)
RDW: 15.9 % — ABNORMAL HIGH (ref 11.5–15.5)
WBC: 14.2 10*3/uL — ABNORMAL HIGH (ref 4.0–10.5)
nRBC: 0 % (ref 0.0–0.2)

## 2019-11-01 LAB — GLUCOSE, CAPILLARY
Glucose-Capillary: 133 mg/dL — ABNORMAL HIGH (ref 70–99)
Glucose-Capillary: 114 mg/dL — ABNORMAL HIGH (ref 70–99)
Glucose-Capillary: 157 mg/dL — ABNORMAL HIGH (ref 70–99)
Glucose-Capillary: 139 mg/dL — ABNORMAL HIGH (ref 70–99)

## 2019-11-01 LAB — TROPONIN I (HIGH SENSITIVITY): Troponin I (High Sensitivity): 2192 ng/L (ref <18)

## 2019-11-01 MED ORDER — DILTIAZEM HCL-DEXTROSE 125-5 MG/125ML-% IV SOLN (PREMIX)
10.0000 mg/h | INTRAVENOUS | Status: DC
  Administered 2019-11-01: 5 mg/h via INTRAVENOUS
  Administered 2019-11-02: 15 mg/h via INTRAVENOUS
  Administered 2019-11-02 – 2019-11-04 (×5): 10 mg/h via INTRAVENOUS
  Filled 2019-11-01 (×7): qty 125

## 2019-11-01 NOTE — Progress Notes (Signed)
Mobility Specialist: Progress Note    11/01/19 1341  Mobility  Activity Transferred:  Chair to bed  Level of Assistance Moderate assist, patient does 50-74%  Assistive Device Front wheel walker  Distance Ambulated (ft) 2 ft  Mobility Response Tolerated fair  Mobility performed by Mobility specialist  Bed Position Semi-fowlers  $Mobility charge 1 Mobility   Pre-Mobility: 117 HR, 138/114 BP, 95% SpO2 During Mobility: 132 HR Post-Mobility: 126 HR, 129/47 BP, 94% SpO2  Pt required mod assist to stand w/ assist steadying while standing and transferring to bed. Pt c/o of soreness in L leg and foot. Pt didn't feel like she could walk but was agreeable to standing at chair before transfer. Pt stood for 30-45 seconds and said she didn't feel comfortable and wanted to transfer and sit down. Pt said she wants to try to walk tomorrow.   [facility] Recardo Linn Mobility Specialist

## 2019-11-01 NOTE — Progress Notes (Signed)
Dr. Oneida Alar called and made aware of Trop. 2192. Erin Hearing. with rapid response called and made aware of tonight's events

## 2019-11-01 NOTE — Progress Notes (Addendum)
[facility] [place]  Subjective  - Doing well over all, soreness, but her left foot feels btter.   Objective (!) 120/56 89 97.9 F (36.6 C) (Oral) 16 97%  Intake/Output Summary (Last 24 hours) at 11/01/2019 0752 Last data filed at 11/01/2019 0210 Gross per 24 hour  Intake 480 ml  Output 250 ml  Net 230 ml    SS/bloody drainage at all leg incisions , right groin soft without hematoma. Doppler signals right peroneal and DP intact Heart RRR Lungs non labored breathing  Assessment/Planning: POD #2 Left fem-BK pop bypass with vein and angioplasty of right fem-pop bypass.  Acute blood loss anemia with HGB 7.2, asymptomatic will cont. To watch BP stable with systolic 416.  Cont. To hold antihypertensive medications for now.  Roxy Horseman 11/01/2019 7:52 AM --  Laboratory Lab Results: Recent Labs    10/31/19 0450 11/01/19 0234  WBC 13.0* 14.2*  HGB 7.4* 7.2*  HCT 24.7* 23.6*  PLT 114* 121*   BMET Recent Labs    10/30/19 0651 10/30/19 1052 10/30/19 1549 10/31/19 0450  NA 135   < > 133* 134*  K 3.6   < > 4.2 4.1  CL 97*  --  95* 100  CO2 26  --   --  27  GLUCOSE 156*  --  259* 159*  BUN 9  --  12 12  CREATININE 0.43*  --  0.50 0.60  CALCIUM 9.5  --   --  8.1*   < > = values in this interval not displayed.    COAG Lab Results  Component Value Date   INR 1.1 10/30/2019   INR 1.08 06/26/2017   INR 1.02 04/07/2017   No results found for: PTT  POD#2.  Plavix started yesterday.  She needs to ambulate.  She is having some drainage in her proximal incision.  Hopefully home over the weekend  [facility]

## 2019-11-01 NOTE — Progress Notes (Signed)
Patient O2 sat dropped to 83% with good pleth. When I entered the room the was patient sleeping and the nasal cannula had fallen out of her nose. Once the nasal cannula had been put back in her nostril the saturation returned to normal at 95% [employer] 3:28 PM

## 2019-11-01 NOTE — Progress Notes (Signed)
Physical Therapy Treatment Patient Details Name: [name] [name] MRN: [id number] DOB: 1955/04/19 Today's Date: 11/01/2019    History of Present Illness 64 y.o. female presenting with L leg ulcer, occlusion of R femoral-popliteal bypass graft s/p bypass graft femoral-popliteal artery (L) and R leg balloon angioplasty SFA (R). PMHx significant for R femoral-popliteal bypass graf on 04/07/2017, L carotid endartectomy on 06/30/2017, COPD, depression, DM, HLD, HTN, obesity, and tobacco use.     PT Comments    Pt was seen for mobility on bed to reposition for comfort and function, and then to do there exercise to both legs.  Her fatigue from being up was an issue with being able to walk, but has asked to try to do this next session.  Talked with her about possibly being able to walk with PT over the weekend, but can ask nursing to help as well.   Follow Up Recommendations  Home health PT;Supervision for mobility/OOB     Equipment Recommendations  Rolling walker with 5" wheels    Recommendations for Other Services       Precautions / Restrictions Precautions Precautions: Fall Precaution Comments: wound vac Restrictions Weight Bearing Restrictions: No RLE Weight Bearing: Weight bearing as tolerated LLE Weight Bearing: Weight bearing as tolerated    Mobility  Bed Mobility Overal bed mobility: Needs Assistance Bed Mobility: Rolling (scooting up ) Rolling: Min guard         General bed mobility comments: scooting with mod assist  Transfers                 General transfer comment: declined OOB  Ambulation/Gait                 Stairs             Wheelchair Mobility    Modified Rankin (Stroke Patients Only)       Balance Overall balance assessment: Needs assistance Sitting-balance support: Feet supported Sitting balance-Leahy Scale: Good                                      Cognition Arousal/Alertness: Awake/alert Behavior During  Therapy: WFL for tasks assessed/performed Overall Cognitive Status: Within Functional Limits for tasks assessed                                        Exercises General Exercises - Lower Extremity Ankle Circles/Pumps: AROM;5 reps Quad Sets: AROM;10 reps Gluteal Sets: AROM;10 reps Heel Slides: AROM;10 reps Hip ABduction/ADduction: AROM;10 reps Straight Leg Raises: AAROM;10 reps    General Comments General comments (skin integrity, edema, etc.): pt reports she has been OOB a great deal today      Pertinent Vitals/Pain Pain Assessment: Faces Faces Pain Scale: Hurts a little bit Pain Location: LLE Pain Descriptors / Indicators: Sore Pain Intervention(s): Premedicated before session    Home Living                      Prior Function            PT Goals (current goals can now be found in the care plan section) Acute Rehab PT Goals Patient Stated Goal: To return home Progress towards PT goals: Progressing toward goals    Frequency    Min 4X/week      PT Plan  Current plan remains appropriate    Co-evaluation              AM-PAC PT "6 Clicks" Mobility   Outcome Measure  Help needed turning from your back to your side while in a flat bed without using bedrails?: A Little Help needed moving from lying on your back to sitting on the side of a flat bed without using bedrails?: A Little Help needed moving to and from a bed to a chair (including a wheelchair)?: A Little Help needed standing up from a chair using your arms (e.g., wheelchair or bedside chair)?: A Little Help needed to walk in hospital room?: A Little Help needed climbing 3-5 steps with a railing? : A Lot 6 Click Score: 17    End of Session Equipment Utilized During Treatment: Gait belt;Oxygen Activity Tolerance: Patient tolerated treatment well;Treatment limited secondary to medical complications (Comment) Patient left: in bed;with call bell/phone within reach Nurse  Communication: Mobility status PT Visit Diagnosis: Unsteadiness on feet (R26.81);Muscle weakness (generalized) (M62.81)     Time: 8502-7741 PT Time Calculation (min) (ACUTE ONLY): 12 min  Charges:  $Therapeutic Exercise: 8-22 mins                   Ramond Dial 11/01/2019, 9:18 PM  Mee Hives, PT MS Acute Rehab Dept. Number: [place] [facility] [place]

## 2019-11-01 NOTE — Progress Notes (Signed)
Patient resting in bed and went into At Fib RVR 140-150's Patient voiced no complaints skin warm and dry resp. Even and unlab. Tim R.N. aware and will come see patient V.S. Done 130/70 R-20 02 at 3 l/n.c. 93% sats. Vitals done by [employer]. Dr fields was page at 21:45 Please see orders to start Cardizem Drip and EKG and Tropine cycle. Will start Cardizem drip when arrives from Pharm.

## 2019-11-02 ENCOUNTER — Inpatient Hospital Stay (HOSPITAL_COMMUNITY): Payer: Medicare Other

## 2019-11-02 LAB — COMPREHENSIVE METABOLIC PANEL
ALT: 31 U/L (ref 0–44)
AST: 89 U/L — ABNORMAL HIGH (ref 15–41)
Albumin: 2.8 g/dL — ABNORMAL LOW (ref 3.5–5.0)
Alkaline Phosphatase: 42 U/L (ref 38–126)
Anion gap: 7 (ref 5–15)
BUN: 16 mg/dL (ref 8–23)
CO2: 23 mmol/L (ref 22–32)
Calcium: 8.1 mg/dL — ABNORMAL LOW (ref 8.9–10.3)
Chloride: 101 mmol/L (ref 98–111)
Creatinine, Ser: 0.55 mg/dL (ref 0.44–1.00)
GFR calc Af Amer: >60 mL/min (ref >60)
GFR calc non Af Amer: >60 mL/min (ref >60)
Glucose, Bld: 171 mg/dL — ABNORMAL HIGH (ref 70–99)
Potassium: 4.4 mmol/L (ref 3.5–5.1)
Sodium: 131 mmol/L — ABNORMAL LOW (ref 135–145)
Total Bilirubin: 0.7 mg/dL (ref 0.3–1.2)
Total Protein: 5 g/dL — ABNORMAL LOW (ref 6.5–8.1)

## 2019-11-02 LAB — BLOOD GAS, ARTERIAL
Acid-base deficit: 1.3 mmol/L (ref 0.0–2.0)
Bicarbonate: 23.7 mmol/L (ref 20.0–28.0)
Drawn by: 36527
FIO2: 36
O2 Saturation: 51.6 %
Patient temperature: 37.5
pCO2 arterial: 46.7 mmHg (ref 32.0–48.0)
pH, Arterial: 7.329 — ABNORMAL LOW (ref 7.350–7.450)
pO2, Arterial: 31.1 mmHg — CL (ref 83.0–108.0)

## 2019-11-02 LAB — CBC
HCT: 20.4 % — ABNORMAL LOW (ref 36.0–46.0)
HCT: 25.9 % — ABNORMAL LOW (ref 36.0–46.0)
Hemoglobin: 6.3 g/dL — CL (ref 12.0–15.0)
Hemoglobin: 8.1 g/dL — ABNORMAL LOW (ref 12.0–15.0)
MCH: 26.1 pg (ref 26.0–34.0)
MCH: 26.6 pg (ref 26.0–34.0)
MCHC: 30.9 g/dL (ref 30.0–36.0)
MCHC: 31.3 g/dL (ref 30.0–36.0)
MCV: 84.6 fL (ref 80.0–100.0)
MCV: 84.9 fL (ref 80.0–100.0)
Platelets: 105 10*3/uL — ABNORMAL LOW (ref 150–400)
Platelets: 116 10*3/uL — ABNORMAL LOW (ref 150–400)
RBC: 2.41 MIL/uL — ABNORMAL LOW (ref 3.87–5.11)
RBC: 3.05 MIL/uL — ABNORMAL LOW (ref 3.87–5.11)
RDW: 16 % — ABNORMAL HIGH (ref 11.5–15.5)
RDW: 16.7 % — ABNORMAL HIGH (ref 11.5–15.5)
WBC: 12.7 10*3/uL — ABNORMAL HIGH (ref 4.0–10.5)
WBC: 14.6 10*3/uL — ABNORMAL HIGH (ref 4.0–10.5)
nRBC: 0.2 % (ref 0.0–0.2)
nRBC: 0.2 % (ref 0.0–0.2)

## 2019-11-02 LAB — GLUCOSE, CAPILLARY
Glucose-Capillary: 175 mg/dL — ABNORMAL HIGH (ref 70–99)
Glucose-Capillary: 210 mg/dL — ABNORMAL HIGH (ref 70–99)
Glucose-Capillary: 225 mg/dL — ABNORMAL HIGH (ref 70–99)
Glucose-Capillary: 202 mg/dL — ABNORMAL HIGH (ref 70–99)

## 2019-11-02 LAB — PREPARE RBC (CROSSMATCH)

## 2019-11-02 LAB — TROPONIN I (HIGH SENSITIVITY)
Troponin I (High Sensitivity): 5130 ng/L (ref <18)
Troponin I (High Sensitivity): 5084 ng/L (ref <18)

## 2019-11-02 MED ORDER — VANCOMYCIN HCL 2000 MG/400ML IV SOLN
2000.0000 mg | Freq: Once | INTRAVENOUS | Status: AC
  Administered 2019-11-02: 2000 mg via INTRAVENOUS
  Filled 2019-11-02: qty 400

## 2019-11-02 MED ORDER — SODIUM CHLORIDE 0.9 % IV SOLN
INTRAVENOUS | Status: DC | PRN
  Administered 2019-11-02: 250 mL via INTRAVENOUS
  Administered 2019-11-03: 500 mL via INTRAVENOUS

## 2019-11-02 MED ORDER — VANCOMYCIN HCL IN DEXTROSE 1-5 GM/200ML-% IV SOLN
1000.0000 mg | Freq: Two times a day (BID) | INTRAVENOUS | Status: DC
  Administered 2019-11-03 (×2): 1000 mg via INTRAVENOUS
  Filled 2019-11-02 (×4): qty 200

## 2019-11-02 MED ORDER — FUROSEMIDE 10 MG/ML IJ SOLN
40.0000 mg | Freq: Once | INTRAMUSCULAR | Status: AC
  Administered 2019-11-02: 40 mg via INTRAVENOUS
  Filled 2019-11-02: qty 4

## 2019-11-02 MED ORDER — FUROSEMIDE 10 MG/ML IJ SOLN
80.0000 mg | Freq: Once | INTRAMUSCULAR | Status: AC
  Administered 2019-11-02: 80 mg via INTRAVENOUS
  Filled 2019-11-02: qty 8

## 2019-11-02 MED ORDER — SODIUM CHLORIDE 0.9% IV SOLUTION: Freq: Once | INTRAVENOUS | Status: AC

## 2019-11-02 MED ORDER — POTASSIUM CHLORIDE CRYS ER 20 MEQ PO TBCR
40.0000 meq | EXTENDED_RELEASE_TABLET | Freq: Once | ORAL | Status: AC
  Administered 2019-11-02: 40 meq via ORAL
  Filled 2019-11-02: qty 2

## 2019-11-02 MED ORDER — SODIUM CHLORIDE 0.9 % IV SOLN
2.0000 g | Freq: Three times a day (TID) | INTRAVENOUS | Status: DC
  Administered 2019-11-02 – 2019-11-05 (×8): 2 g via INTRAVENOUS
  Filled 2019-11-02 (×11): qty 2

## 2019-11-02 MED ORDER — SODIUM CHLORIDE 0.9% FLUSH: 10.0000 mL | INTRAVENOUS | Status: DC | PRN

## 2019-11-02 NOTE — Progress Notes (Signed)
Mobility Specialist: Progress Note   11/02/19 1529  Mobility  Activity Transferred to/from [facility]  Level of Assistance Moderate assist, patient does 50-74%  Assistive Device Front wheel walker  Mobility Response Tolerated fair  Mobility performed by Mobility specialist  Bed Position Semi-fowlers  $Mobility charge 1 Mobility   Pt tolerated transfer fair. We transferred from the chair to the [facility] and then went to the bed with assistance from RN.   [facility] Mobility Specialist

## 2019-11-02 NOTE — Progress Notes (Signed)
PT Cancellation Note  Patient Details Name: [name] [name] MRN: [id number] DOB: 09/17/1955   Cancelled Treatment:    Reason Eval/Treat Not Completed: Medical issues which prohibited therapy.  Transfusing and will wait for cardiology consult.  Suspected MI.   Ramond Dial 11/02/2019, 10:48 AM   Mee Hives, PT MS Acute Rehab Dept. Number: [place] [facility] [place]

## 2019-11-02 NOTE — Progress Notes (Addendum)
Pt with some shortness of breath this afternoon after blood transfusion earlier.  She is probably hypovolemic from blood and several days of IV fluid  She needs a foley catheter now to monitor urine output Check arterial blood gas Will give 80 mg lasix now Needs repeat BMET this evening to check creatinine and potassium May need transfer to ICU if not improved soon She remains in sinus rhythm in Diltiazem need to turn down to 10 mg with hypotension Troponin seems to have peaked and is trending down most likely from demand ischemia during rapid afib event earlier.  Continue to trend troponin if continues to rise will consult cardiology  Chest xray also shows blossoming right lung pneumonia will start antibiotics  Needs respiratory culture  [name], MD [facility] [place] Office: [phone]

## 2019-11-02 NOTE — Progress Notes (Signed)
S.R. 90's

## 2019-11-02 NOTE — Progress Notes (Addendum)
[facility] [place]  Subjective  - Long night, feels fine over all.   Objective (!) 122/54 88 98.2 F (36.8 C) (Oral) (!) 22 92%  Intake/Output Summary (Last 24 hours) at 11/02/2019 0741 Last data filed at 11/02/2019 2409 Gross per 24 hour  Intake 2858.67 ml  Output 570 ml  Net 2288.67 ml    Doppler signals right peroneal and DP intact, motor intact, sensation intact. Left groin withPrevena  vac, no drainage in canister. Multiple small wounds left foot and ankle, second toe tip dry gangrene. Lungs non labored breathing, O2 support SAT 92% Speech clear and oriented X 3    Assessment/Planning: POD # 3   #1: Left femoral to below-knee popliteal artery bypass with ipsilateral nonreversed saphenous vein                         #2: Left external iliac, common femoral, profundofemoral endarterectomy                         #3: Right lower extremity angiogram                         #4: Balloon angioplasty, right femoral-popliteal bypass                         #5: Left groin Prevena wound VAC   Patent doppler signals left LE Patient went into rapid afib and elevated troponin.  Cardizem drip hanging 15 mg/hr.  Patinet converted to NS.  Receiving second unit PRBC currently.  HGB was 6.3 this am symptomatic over night. EBL 800 ml intraoperatively.  Acute blood loss anemia.  CBC ordered after PRBC complete.    Troponin 2192 No CP/SOB, A & O x3, NSR HR 88, BP 122/54  Add: EXAM: CHEST  1 VIEW  COMPARISON:  02/18/2013  FINDINGS: Cardiac shadow is within normal limits. The lungs are well aerated bilaterally. Patchy airspace opacity is noted throughout both lungs as well as some interstitial edema. No sizable effusion is noted.  IMPRESSION: Changes of mild CHF and some patchy pneumonic infiltrate. Follow-up examination is recommended.  D/C NS IV fluids.  Roxy Horseman 11/02/2019 7:41 AM --  Agree with above.  Will check post transfusion  labs.  Continue to trend troponin Will probably need some diuresis post transfusion Currently in sinus rhythm continue Dilt drip Hyponatremia probably secondary to IV fluid trend for now many need normal saline IV if continues downward  Peripheral neuropathy.  Pt currently on lyrica and gabapentin.  D/w pt she prefers gabapentin. Will d/c lyrica and do gabapentin 100 mg TID  [name], MD [facility] [place]: [phone]    Laboratory Lab Results: Recent Labs    11/01/19 0234 11/02/19 0209  WBC 14.2* 12.7*  HGB 7.2* 6.3*  HCT 23.6* 20.4*  PLT 121* 105*   BMET Recent Labs    10/31/19 0450 11/02/19 0209  NA 134* 131*  K 4.1 4.4  CL 100 101  CO2 27 23  GLUCOSE 159* 171*  BUN 12 16  CREATININE 0.60 0.55  CALCIUM 8.1* 8.1*    COAG Lab Results  Component Value Date   INR 1.1 10/30/2019   INR 1.08 06/26/2017   INR 1.02 04/07/2017   No results found for: PTT

## 2019-11-02 NOTE — Progress Notes (Signed)
Called rapid response for assessment of pt with worsening respiratory distress complicated by anxiety attack. Patient feels she can not breathe and she has not been having breathing issues until this surgery. Feels we have given her Covid and are not taking care of her.  Patient needed foley but needed to get assistance due to size and breathing difficulty.  Patient feels hospital is short staffed and does not have materials needed to care for patient.  Reassured patient. Pt called daughter and spoke to her about anxiety and let her know about foley and fluid build up. Pt resting with call bell within reach.  Will continue to monitor.

## 2019-11-02 NOTE — Progress Notes (Addendum)
Pharmacy Antibiotic Note  [name] [name] is a 64 y.o. female admitted on 10/30/2019 with pneumonia.  Pharmacy has been consulted for vancomycin and cefepime dosing.  S/p Left fem-BK pop bypass with vein and angioplasty of right fem-pop bypass on 8/11. WBC 14.6, afebrile. CXR showing moderate severity infiltrate within mid R lung associated with SOB. Has PCN allergy listed as rash - has tolerated ceftriaxone and cefepime in the past. Scr 0.55 (CrCl 91 mL/min).   Plan: Vancomycin 2g IV once then 1g IV every 12 hours Cefepime 2 g IV every 8 hours Monitor renal fx, cx results, clinical pic, and levels as appropriate   Height: 5\' 7"  (170.2 cm) Weight: 116.6 kg (257 lb) IBW/kg (Calculated) : 61.6  Temp (24hrs), Avg:98.3 F (36.8 C), Min:97.8 F (36.6 C), Max:99.5 F (37.5 C)  Recent Labs  Lab 10/29/19 0615 10/30/19 0651 10/30/19 0651 10/30/19 1549 10/30/19 1725 10/31/19 0450 11/01/19 0234 11/02/19 0209 11/02/19 1329  WBC  --  10.7*   < >  --  16.7* 13.0* 14.2* 12.7* 14.6*  CREATININE 0.50 0.43*  --  0.50  --  0.60  --  0.55  --    < > = values in this interval not displayed.    Estimated Creatinine Clearance: 91.3 mL/min (by C-G formula based on SCr of 0.55 mg/dL).    Allergies  Allergen Reactions  . Iohexol Rash    Pt states she had a rash 35-40 yrs ago during a procedure.  Benadryl was given and pt was fine in 30 mins. She has had multiple CT's since with premeds and has done fine.  No anaphylaxis per pt. I updated this record. [name], RTRCT  03/06/17  . Penicillins Rash and Other (See Comments)    PATIENT HAS HAD A PCN REACTION WITH IMMEDIATE RASH, FACIAL/TONGUE/THROAT SWELLING, SOB, OR LIGHTHEADEDNESS WITH HYPOTENSION:  #  #  YES  #  #  Has patient had a PCN reaction causing severe rash involving mucus membranes or skin necrosis: No Has patient had a PCN reaction that required hospitalization: No Has patient had a PCN reaction occurring within the last 10 years:  No Can take amoxicillin     . Morphine And Related Nausea And Vomiting    Makes her crawl out of her skin  . Sulfa Antibiotics Rash    Antimicrobials this admission: Vancomycin 8/14 >>  Cefepime 8/14 >>   Dose adjustments this admission: N/A   Microbiology results: 8/11 MRSA PCR: neg  Thank you for allowing pharmacy to be a part of this patient's care.  Antonietta Jewel, PharmD, [place] Clinical Pharmacist  Phone: [phone] 11/02/2019 6:23 PM  Please check AMION for all [place] phone numbers After 10:00 PM, call [place] [phone]

## 2019-11-02 NOTE — Progress Notes (Signed)
[facility] [place]  Subjective  - feels ok   Objective 104/61 (!) 124 98.3 F (36.8 C) (Oral) 17 92%  Intake/Output Summary (Last 24 hours) at 11/02/2019 0057 Last data filed at 11/01/2019 1900 Gross per 24 hour  Intake 840 ml  Output 550 ml  Net 290 ml   Left leg edematous left foot pink warm No focal hematoma  Troponin 2192  Assessment/Planning: Called to see pt with rapid afib and elevated troponin.  She currently in asymptomatic with no chest pain nausea vomiting or shortness of breath.  She has had borderline hypotension with her BP meds held over the last 36 hours.  Her HR is still 130s 140s on 15 mg/hr diltiazem. Will check hemoglobin but proceed with transfusion now since possible MI and relative hypotension  Continue to cycle cardiac enzymes  She does not appear to have ST changes so I do not believe she needs emergent cath.  Will try to adjust physiologic parameters and will call cardiology later this morning if rate control does not improve  [name] 11/02/2019 12:57 AM --  Laboratory Lab Results: Recent Labs    10/31/19 0450 11/01/19 0234  WBC 13.0* 14.2*  HGB 7.4* 7.2*  HCT 24.7* 23.6*  PLT 114* 121*   BMET Recent Labs    10/30/19 0651 10/30/19 1052 10/30/19 1549 10/31/19 0450  NA 135   < > 133* 134*  K 3.6   < > 4.2 4.1  CL 97*  --  95* 100  CO2 26  --   --  27  GLUCOSE 156*  --  259* 159*  BUN 9  --  12 12  CREATININE 0.43*  --  0.50 0.60  CALCIUM 9.5  --   --  8.1*   < > = values in this interval not displayed.    COAG Lab Results  Component Value Date   INR 1.1 10/30/2019   INR 1.08 06/26/2017   INR 1.02 04/07/2017   No results found for: PTT

## 2019-11-02 NOTE — Progress Notes (Signed)
Lab called with critical PO2 of 31.1.  Respiratory was not sure when drawn if actually arterial. Pt is sating mid-upper 90's on 4 liters without distress currently. Payton Emerald, RN

## 2019-11-02 NOTE — Significant Event (Signed)
Rapid Response Event Note   Reason for Call : Respiratory distress   Initial Focused Assessment: Patient was receiving a neb treatment when we walked into the room.  She was coughing really hard with a productive cough.  The patient is receiving antibiotics for pneumonia.  The patient was wheezing bilaterally.  Her lower extremities were edematous and the left leg had 3+ nonpitting edema, and her right leg was 2+.    BP 147/54 (79) Pulse 80 Resp 25 O2 95    Interventions:  80 mg of Lasix was given, ABG was collected but it might have been venous, a foley catheter was placed, we repositioned her, we put a clean bed pad under her, and emotional support was given.  After some time the patient was able to calm down and her respiratory distress subsided. When we were leaving the patient thanked [place] for our help and was much more relaxed   Plan of Care:  We discussed with the nurse to follow up with [place] if the patient has further trouble breathing.     Event Summary:   MD Notified:  Call Time: [place] Time: 2179 End Time: 1830  Venetia Maxon, RN

## 2019-11-02 NOTE — Progress Notes (Signed)
Patient converted to S.T. 112

## 2019-11-02 NOTE — Progress Notes (Signed)
Not able to do Trop. Due to patient is receiving blood.

## 2019-11-03 ENCOUNTER — Inpatient Hospital Stay (HOSPITAL_COMMUNITY): Payer: Medicare Other

## 2019-11-03 DIAGNOSIS — R0603 Acute respiratory distress: Secondary | ICD-10-CM | POA: Diagnosis not present

## 2019-11-03 DIAGNOSIS — J441 Chronic obstructive pulmonary disease with (acute) exacerbation: Secondary | ICD-10-CM | POA: Diagnosis not present

## 2019-11-03 DIAGNOSIS — J9601 Acute respiratory failure with hypoxia: Secondary | ICD-10-CM | POA: Diagnosis not present

## 2019-11-03 LAB — CBC
HCT: 26.2 % — ABNORMAL LOW (ref 36.0–46.0)
Hemoglobin: 8.1 g/dL — ABNORMAL LOW (ref 12.0–15.0)
MCH: 26.4 pg (ref 26.0–34.0)
MCHC: 30.9 g/dL (ref 30.0–36.0)
MCV: 85.3 fL (ref 80.0–100.0)
Platelets: 124 10*3/uL — ABNORMAL LOW (ref 150–400)
RBC: 3.07 MIL/uL — ABNORMAL LOW (ref 3.87–5.11)
RDW: 16.9 % — ABNORMAL HIGH (ref 11.5–15.5)
WBC: 16.1 10*3/uL — ABNORMAL HIGH (ref 4.0–10.5)
nRBC: 0.2 % (ref 0.0–0.2)

## 2019-11-03 LAB — BASIC METABOLIC PANEL
Anion gap: 7 (ref 5–15)
Anion gap: 8 (ref 5–15)
BUN: 24 mg/dL — ABNORMAL HIGH (ref 8–23)
BUN: 26 mg/dL — ABNORMAL HIGH (ref 8–23)
CO2: 24 mmol/L (ref 22–32)
CO2: 23 mmol/L (ref 22–32)
Calcium: 8.6 mg/dL — ABNORMAL LOW (ref 8.9–10.3)
Calcium: 8.6 mg/dL — ABNORMAL LOW (ref 8.9–10.3)
Chloride: 100 mmol/L (ref 98–111)
Chloride: 99 mmol/L (ref 98–111)
Creatinine, Ser: 0.76 mg/dL (ref 0.44–1.00)
Creatinine, Ser: 0.67 mg/dL (ref 0.44–1.00)
GFR calc Af Amer: >60 mL/min (ref >60)
GFR calc Af Amer: >60 mL/min (ref >60)
GFR calc non Af Amer: >60 mL/min (ref >60)
GFR calc non Af Amer: >60 mL/min (ref >60)
Glucose, Bld: 151 mg/dL — ABNORMAL HIGH (ref 70–99)
Glucose, Bld: 181 mg/dL — ABNORMAL HIGH (ref 70–99)
Potassium: 4.4 mmol/L (ref 3.5–5.1)
Potassium: 4.4 mmol/L (ref 3.5–5.1)
Sodium: 131 mmol/L — ABNORMAL LOW (ref 135–145)
Sodium: 130 mmol/L — ABNORMAL LOW (ref 135–145)

## 2019-11-03 LAB — CBC WITH DIFFERENTIAL/PLATELET
Abs Immature Granulocytes: 0.29 10*3/uL — ABNORMAL HIGH (ref 0.00–0.07)
Basophils Absolute: 0 10*3/uL (ref 0.0–0.1)
Basophils Relative: 0 %
Eosinophils Absolute: 0.1 10*3/uL (ref 0.0–0.5)
Eosinophils Relative: 0 %
HCT: 26.3 % — ABNORMAL LOW (ref 36.0–46.0)
Hemoglobin: 8.3 g/dL — ABNORMAL LOW (ref 12.0–15.0)
Immature Granulocytes: 2 %
Lymphocytes Relative: 9 %
Lymphs Abs: 1.7 10*3/uL (ref 0.7–4.0)
MCH: 27 pg (ref 26.0–34.0)
MCHC: 31.6 g/dL (ref 30.0–36.0)
MCV: 85.7 fL (ref 80.0–100.0)
Monocytes Absolute: 1.5 10*3/uL — ABNORMAL HIGH (ref 0.1–1.0)
Monocytes Relative: 8 %
Neutro Abs: 14.5 10*3/uL — ABNORMAL HIGH (ref 1.7–7.7)
Neutrophils Relative %: 81 %
Platelets: 139 10*3/uL — ABNORMAL LOW (ref 150–400)
RBC: 3.07 MIL/uL — ABNORMAL LOW (ref 3.87–5.11)
RDW: 16.7 % — ABNORMAL HIGH (ref 11.5–15.5)
WBC: 18 10*3/uL — ABNORMAL HIGH (ref 4.0–10.5)
nRBC: 0.3 % — ABNORMAL HIGH (ref 0.0–0.2)

## 2019-11-03 LAB — BPAM RBC
Blood Product Expiration Date: 202109112359
Blood Product Expiration Date: 202109112359
ISSUE DATE / TIME: 202108140259
ISSUE DATE / TIME: 202108140618
Unit Type and Rh: 5100
Unit Type and Rh: 5100

## 2019-11-03 LAB — EXPECTORATED SPUTUM ASSESSMENT W GRAM STAIN, RFLX TO RESP C

## 2019-11-03 LAB — TYPE AND SCREEN
ABO/RH(D): O POS
Antibody Screen: NEGATIVE
Unit division: 0
Unit division: 0

## 2019-11-03 LAB — BLOOD GAS, ARTERIAL
Acid-base deficit: 2.3 mmol/L — ABNORMAL HIGH (ref 0.0–2.0)
Bicarbonate: 22.7 mmol/L (ref 20.0–28.0)
Drawn by: 31101
FIO2: 44
O2 Saturation: 69.7 %
Patient temperature: 36.7
pCO2 arterial: 43.1 mmHg (ref 32.0–48.0)
pH, Arterial: 7.339 — ABNORMAL LOW (ref 7.350–7.450)
pO2, Arterial: 38.2 mmHg — CL (ref 83.0–108.0)

## 2019-11-03 LAB — GLUCOSE, CAPILLARY
Glucose-Capillary: 188 mg/dL — ABNORMAL HIGH (ref 70–99)
Glucose-Capillary: 196 mg/dL — ABNORMAL HIGH (ref 70–99)
Glucose-Capillary: 176 mg/dL — ABNORMAL HIGH (ref 70–99)
Glucose-Capillary: 173 mg/dL — ABNORMAL HIGH (ref 70–99)
Glucose-Capillary: 193 mg/dL — ABNORMAL HIGH (ref 70–99)
Glucose-Capillary: 213 mg/dL — ABNORMAL HIGH (ref 70–99)

## 2019-11-03 LAB — TROPONIN I (HIGH SENSITIVITY): Troponin I (High Sensitivity): 4373 ng/L (ref <18)

## 2019-11-03 MED ORDER — METHYLPREDNISOLONE SODIUM SUCC 40 MG IJ SOLR: 30.0000 mg | Freq: Every day | INTRAMUSCULAR | Status: DC

## 2019-11-03 MED ORDER — NICOTINE 21 MG/24HR TD PT24
21.0000 mg | MEDICATED_PATCH | Freq: Every day | TRANSDERMAL | Status: DC
  Administered 2019-11-03 – 2019-11-16 (×14): 21 mg via TRANSDERMAL
  Filled 2019-11-03 (×14): qty 1

## 2019-11-03 MED ORDER — FUROSEMIDE 10 MG/ML IJ SOLN
80.0000 mg | Freq: Once | INTRAMUSCULAR | Status: AC
  Administered 2019-11-03: 80 mg via INTRAVENOUS
  Filled 2019-11-03: qty 8

## 2019-11-03 MED ORDER — METHYLPREDNISOLONE SODIUM SUCC 125 MG IJ SOLR
INTRAMUSCULAR | Status: AC
  Administered 2019-11-03: 60 mg via INTRAVENOUS
  Filled 2019-11-03: qty 2

## 2019-11-03 MED ORDER — METHYLPREDNISOLONE SODIUM SUCC 125 MG IJ SOLR: 60.0000 mg | Freq: Every day | INTRAMUSCULAR | Status: DC

## 2019-11-03 MED ORDER — INSULIN ASPART 100 UNIT/ML ~~LOC~~ SOLN
3.0000 [IU] | SUBCUTANEOUS | Status: DC
  Administered 2019-11-03: 6 [IU] via SUBCUTANEOUS
  Administered 2019-11-03: 9 [IU] via SUBCUTANEOUS
  Administered 2019-11-03 (×2): 6 [IU] via SUBCUTANEOUS
  Administered 2019-11-04: 3 [IU] via SUBCUTANEOUS
  Administered 2019-11-04 – 2019-11-05 (×4): 6 [IU] via SUBCUTANEOUS

## 2019-11-03 MED ORDER — METHYLPREDNISOLONE SODIUM SUCC 40 MG IJ SOLR: 10.0000 mg | Freq: Every day | INTRAMUSCULAR | Status: DC

## 2019-11-03 MED ORDER — DIPHENHYDRAMINE HCL 50 MG/ML IJ SOLN
50.0000 mg | Freq: Once | INTRAMUSCULAR | Status: AC
  Administered 2019-11-03: 50 mg via INTRAVENOUS
  Filled 2019-11-03: qty 1

## 2019-11-03 MED ORDER — METHYLPREDNISOLONE SODIUM SUCC 125 MG IJ SOLR: 50.0000 mg | Freq: Every day | INTRAMUSCULAR | Status: DC

## 2019-11-03 MED ORDER — ALBUTEROL SULFATE (2.5 MG/3ML) 0.083% IN NEBU
INHALATION_SOLUTION | RESPIRATORY_TRACT | Status: AC
  Administered 2019-11-03: 2.5 mg
  Filled 2019-11-03: qty 3

## 2019-11-03 MED ORDER — METHYLPREDNISOLONE SODIUM SUCC 40 MG IJ SOLR: 40.0000 mg | Freq: Every day | INTRAMUSCULAR | Status: DC

## 2019-11-03 MED ORDER — IPRATROPIUM-ALBUTEROL 0.5-2.5 (3) MG/3ML IN SOLN
3.0000 mL | RESPIRATORY_TRACT | Status: DC
  Administered 2019-11-03 – 2019-11-04 (×4): 3 mL via RESPIRATORY_TRACT
  Filled 2019-11-03 (×4): qty 3

## 2019-11-03 MED ORDER — IOHEXOL 350 MG/ML SOLN
75.0000 mL | Freq: Once | INTRAVENOUS | Status: AC | PRN
  Administered 2019-11-03: 75 mL via INTRAVENOUS

## 2019-11-03 MED ORDER — METHYLPREDNISOLONE SODIUM SUCC 40 MG IJ SOLR: 20.0000 mg | Freq: Every day | INTRAMUSCULAR | Status: DC

## 2019-11-03 NOTE — Progress Notes (Signed)
Last ABG sample appears to be mix venous according to the po2 result. Spoke with lab and RN. Stated to RN to notify if MD wants to [place] pt.

## 2019-11-03 NOTE — Progress Notes (Signed)
PCCM interval progress note:  Asked to evaluate patient after transfer as sats had dropped after transfer to the ICU.  On evaluation, sats had recovered on 15L HFNC, pt in no distress.  Repeat Covid-19 pending.   Otilio Carpen Miesha Bachmann, PA-C

## 2019-11-03 NOTE — Significant Event (Addendum)
Rapid Response Event Note   Reason for Call :  Respiratory Distress  Initial Focused Assessment:  Patient had audible wheezes when we walked into the room.  She was also coughing and able to clear her secretions.  Her vital signs looked good and she was alert.  Patient stated that she had not slept well overnight.  She still appeared to be volume overloaded, especially in her lower extremities.  She did not appear to be in acute respiratory distress at the time.  Patient was on 5L of O2 via nasal cannula.  I was able to get the patient to use the incentive spirometer for me and she was pulling 500-700 cc of air with coughing intervals.  She agreed to continue to use it.    BP 142/74 (89) Pulse 89 Resp 23 o2 91 ([place])    Interventions:  Incentive spirometer, duoneb ordered and given, bed pad changed, 80 mg of IV lasix given, solumedrol given and scheduled, benadryl to be given before CT scan, echo ordered, and PCCM came by to assess the patient.  Before leaving the patient stated that she was breathing better and her wheezing was still present but was less audible.  The daughter was bedside with her mother.  Plan of Care:  Patient is going to remain on the unit vs transfer to ICU   Event Summary:   MD Notified: 1050 Call Time: 0830 Arrival Time: 0830 End Time:  Venetia Maxon, RN

## 2019-11-03 NOTE — Consult Note (Signed)
NAME:  [name] [name], MRN:  [id number], DOB:  04-07-1955, LOS: 4 ADMISSION DATE:  10/30/2019, CONSULTATION DATE:  11/03/2018 REFERRING MD:  Eudelia Bunch, CHIEF COMPLAINT:  SOB with progressive hypoxemia   Brief History   This is a 64 y.o. W F admitted for surgical management of a left leg ulcer and occlusion of a right femoral-popliteal bypass graft. The patient underwent surgical intervention on 8/11 with: 1) Left femoral to below-knee popliteal artery bypass with ipsilateral nonreversed saphenous vein, 2) Left external iliac, common femoral, profundofemoral endarterectomy, 3) Right lower extremity angiogram, 4) Balloon angioplasty, right femoral-popliteal bypass and, 5) Left groin Prevena wound VAC.  On 8/14, the patient had an episode of A-Fib with RVR with an elevated troponin (5084) that converted to NSR with Cardizem. ECG 8/1-8/14 shows a new RBBB c/w ECG 04/04/2017. The patient, although obese, has not been on consistent DVT prophylaxis and currently not on SCDs or anticoagulants. CXR (0805) showed patchy airspace opacity is noted throughout both lungs as well as some interstitial edema. However, later CXR at 1613 progressed with "moderate severity infiltrate is noted within the mid right lung and lateral aspect of the right lung base, which is increased in severity when compared to the prior exam." Cefipime and vanc was initiated for possible HAP with WBC albeit elevated but showing no consistent trend from 8/11 thru 8/15.  Early AM was seen by RRT for SOB and given a Duoneb. Patient remains borderline hypoxemic on nasal cannula. She does acknowledge at least a 40 pk-yr tobacco hx and continues to smoke 1ppd. She is currently managed OPD with Flovent + albuterol. Although she is net 5780 mL since admission, she was net negative during the period that her CXR worsened. PCCM is being consulted for progressive hypoxemia and SOB  Past Medical History   Past Medical History:  Diagnosis Date  .  Abdominal abscess   . Allergic rhinitis   . Asthma   . Bacteremia 03/2017  . Carotid stenosis, left 04/2009   50-69%  . Colon polyps 12/05  . COPD (chronic obstructive pulmonary disease) ([place])   . Depression   . Diabetes mellitus    followed by Dr. Chalmers Cater  . Folate deficiency   . Hyperlipidemia   . Hypertension   . Lumbar disc disease    h/o hnp with repair  . Obesity, morbid (more than 100 lbs over ideal weight or BMI > 40) ([facility])   . Peripheral vascular disease ([place])   . Psoriasis   . Thrombocytopenia ([place])    work up with Dr. Griffith Citron neg in 2000  . Tobacco abuse     Consults:  8/15 PCCM  Procedures:  As noted above  Micro Data:  8/15: Sputum culture  Antimicrobials:  8/14 Cefipime 8/14 Vanc   Interim history/subjective:  Increasing SOB over past 2 days  Objective   Blood pressure 104/75, pulse 93, temperature 98.4 F (36.9 C), temperature source Oral, resp. rate (!) 21, height 5\' 7"  (1.702 m), weight 129.3 kg, SpO2 94 %.        Intake/Output Summary (Last 24 hours) at 11/03/2019 0941 Last data filed at 11/03/2019 0900 Gross per 24 hour  Intake 1337.21 ml  Output 2050 ml  Net -712.79 ml   Filed Weights   10/30/19 0704 11/03/19 0600  Weight: 116.6 kg 129.3 kg    Examination: General: Obese W F mod respiratory distress. O2 sat 92% on 6LNC HENT: [place]/AT PRRL EOMI Lungs: No crackles but diffuse expiratory wheezes and  rhonchi bilateally Cardiovascular: Distant HS but appear regular w/o m/r/g Abdomen: Obese but NT; +BS Extremities: Dressings in place Neuro: A&O. Moves all 4s GU: Not examined  Assessment & Plan:  1) Likely COPD with acute exacerbation - Will initiate Duonebs Q4 along with steroid burst and taper  2) Possible HAP -current Abx regimen is appropriate for both AECOPD and HAP -f/u on sputum culture -trend WBC  3) Acute hypoxemic respiratory insufficiency -with relatively new RBBB, a-fib with RVR and elevated troponins  (but no ST-segment elevation), both NSTEMI and acute pulmonary embolus are in the DDx. -Schedule ECHO and CTA for today  Will follow with you  Labs   CBC: Recent Labs  Lab 11/01/19 0234 11/02/19 0209 11/02/19 1329 11/02/19 2239 11/03/19 0715  WBC 14.2* 12.7* 14.6* 18.0* 16.1*  NEUTROABS  --   --   --  14.5*  --   HGB 7.2* 6.3* 8.1* 8.3* 8.1*  HCT 23.6* 20.4* 25.9* 26.3* 26.2*  MCV 85.2 84.6 84.9 85.7 85.3  PLT 121* 105* 116* 139* 124*    Basic Metabolic Panel: Recent Labs  Lab 10/30/19 0651 10/30/19 1052 10/30/19 1549 10/31/19 0450 11/02/19 0209 11/02/19 2239 11/03/19 0715  NA 135   < > 133* 134* 131* 131* 130*  K 3.6   < > 4.2 4.1 4.4 4.4 4.4  CL 97*  --  95* 100 101 100 99  CO2 26  --   --  27 23 24 23   GLUCOSE 156*  --  259* 159* 171* 151* 181*  BUN 9  --  12 12 16  24* 26*  CREATININE 0.43*  --  0.50 0.60 0.55 0.76 0.67  CALCIUM 9.5  --   --  8.1* 8.1* 8.6* 8.6*   < > = values in this interval not displayed.   GFR: Estimated Creatinine Clearance: 96.9 mL/min (by C-G formula based on SCr of 0.67 mg/dL). Recent Labs  Lab 11/02/19 0209 11/02/19 1329 11/02/19 2239 11/03/19 0715  WBC 12.7* 14.6* 18.0* 16.1*    Liver Function Tests: Recent Labs  Lab 10/30/19 0651 11/02/19 0209  AST 25 89*  ALT 19 31  ALKPHOS 70 42  BILITOT 0.6 0.7  PROT 6.5 5.0*  ALBUMIN 3.7 2.8*   No results for input(s): LIPASE, AMYLASE in the last 168 hours. No results for input(s): AMMONIA in the last 168 hours.  ABG    Component Value Date/Time   PHART 7.339 (L) 11/02/2019 2302   PCO2ART 43.1 11/02/2019 2302   PO2ART 38.2 (LL) 11/02/2019 2302   HCO3 22.7 11/02/2019 2302   TCO2 28 10/30/2019 1549   ACIDBASEDEF 2.3 (H) 11/02/2019 2302   O2SAT 69.7 11/02/2019 2302     Coagulation Profile: Recent Labs  Lab 10/30/19 0651  INR 1.1    Cardiac Enzymes: No results for input(s): CKTOTAL, CKMB, CKMBINDEX, TROPONINI in the last 168 hours.  HbA1C: Hgb A1c MFr Bld    Date/Time Value Ref Range Status  01/28/2013 11:10 AM 8.0 (H) <5.7 % Final    Comment:                                                                           According to the ADA Clinical Practice Recommendations for  2011, when HbA1c is used as a screening test:     >=6.5%   Diagnostic of Diabetes Mellitus            (if abnormal result is confirmed)   5.7-6.4%   Increased risk of developing Diabetes Mellitus   References:Diagnosis and Classification of Diabetes Mellitus,Diabetes Care,2011,34(Suppl 1):S62-S69 and Standards of Medical Care in         Diabetes - 2011,Diabetes [facility],2993,71 (Suppl 1):S11-S61.    08/04/2007 07:25 PM (H)  Final   6.9 (NOTE)   The ADA recommends the following therapeutic goals for glycemic   control related to Hgb A1C measurement:   Goal of Therapy:   < 7.0% Hgb A1C   Action Suggested:  > 8.0% Hgb A1C   Ref:  Diabetes Care, 22, Suppl. 1, 1999    CBG: Recent Labs  Lab 11/02/19 1116 11/02/19 1903 11/02/19 2049 11/03/19 0601 11/03/19 0804  GLUCAP 210* 225* 202* 188* 196*    Past Medical History  She,  has a past medical history of Abdominal abscess, Allergic rhinitis, Arthritis, Asthma, Bacteremia (03/2017), Carotid stenosis, left (04/2009), Colon polyps (12/05), COPD (chronic obstructive pulmonary disease) ([place]), Depression, Diabetes mellitus, Dyspnea, Folate deficiency, Hyperlipidemia, Hypertension, Lumbar disc disease, Obesity, morbid (more than 100 lbs over ideal weight or BMI > 40) ([place]), Peripheral vascular disease ([place]), Psoriasis, Thrombocytopenia ([place]), and Tobacco abuse.   Surgical History    Past Surgical History:  Procedure Laterality Date  . ABDOMINAL AORTOGRAM W/LOWER EXTREMITY N/A 02/28/2017   Procedure: ABDOMINAL AORTOGRAM W/LOWER EXTREMITY;  Surgeon: Serafina Mitchell, MD;  Location: [facility];  Service: Cardiovascular;  Laterality: N/A;  Bilateral  . ABDOMINAL AORTOGRAM W/LOWER EXTREMITY N/A 10/29/2019   Procedure:  ABDOMINAL AORTOGRAM W/LOWER EXTREMITY;  Surgeon: Serafina Mitchell, MD;  Location: [facility];  Service: Cardiovascular;  Laterality: N/A;  . ANGIOPLASTY Right 10/30/2019   Procedure: RIGHT LEG BALLOON  ANGIOPLASTY SFA;  Surgeon: Serafina Mitchell, MD;  Location: [facility];  Service: Vascular;  Laterality: Right;  . BACK SURGERY  1989   LS  for HNP  . CHOLECYSTECTOMY  1982  . ENDARTERECTOMY Left 06/30/2017   Procedure: ENDARTERECTOMY CAROTID LEFT;  Surgeon: Serafina Mitchell, MD;  Location: [facility];  Service: Vascular;  Laterality: Left;  . FEMORAL-POPLITEAL BYPASS GRAFT Right 04/07/2017   Procedure: RIGHT FEMORAL-BELOW POPLITEAL ARTERY BYPASS WITH VEIN, RIGHT COMMON AND PRFUNDA FEMORAL ARTERY ENDARTERECTOMY WITH VEIN PATCH ANGIOPLASTY;  Surgeon: Serafina Mitchell, MD;  Location: [facility];  Service: Vascular;  Laterality: Right;  . FEMORAL-POPLITEAL BYPASS GRAFT Left 10/30/2019   Procedure: BYPASS GRAFT FEMORAL-POPLITEAL ARTERY;  Surgeon: Serafina Mitchell, MD;  Location: [facility];  Service: Vascular;  Laterality: Left;  . HERNIA REPAIR  2001   incisional  . INCISE AND DRAIN ABCESS  01/11/11   abd abscess    Review of Systems Gen: No systemic c/o fevers, unexplained wt changes Resp: As described above in hx CV: No prior MI/angina GU: No dysuria/hematuria Endo: +DM on metformin and insulin Neuro; No hx seizures/syncope Integ: As described in H&P GI: No abd c/o  Social History   reports that she has been smoking cigarettes. She has a 19.50 pack-year smoking history. She has never used smokeless tobacco. She reports that she does not drink alcohol and does not use drugs.   Family History   Her family history includes Alcohol abuse in her father; Asthma in her mother; Cancer in her sister and sister; Dementia in her sister;  Hypertension in her mother; Stroke in her father; Suicidality in her brother and sister.   Allergies Allergies  Allergen Reactions  . Iohexol Rash    Pt states she had a rash  35-40 yrs ago during a procedure.  Benadryl was given and pt was fine in 30 mins. She has had multiple CT's since with premeds and has done fine.  No anaphylaxis per pt. I updated this record. [name], RTRCT  03/06/17  . Penicillins Rash and Other (See Comments)    PATIENT HAS HAD A PCN REACTION WITH IMMEDIATE RASH, FACIAL/TONGUE/THROAT SWELLING, SOB, OR LIGHTHEADEDNESS WITH HYPOTENSION:  #  #  YES  #  #  Has patient had a PCN reaction causing severe rash involving mucus membranes or skin necrosis: No Has patient had a PCN reaction that required hospitalization: No Has patient had a PCN reaction occurring within the last 10 years: No Can take amoxicillin     . Morphine And Related Nausea And Vomiting    Makes her crawl out of her skin  . Sulfa Antibiotics Rash     Home Medications  Prior to Admission medications   Medication Sig Start Date End Date Taking? Authorizing Provider  Ascorbic Acid (VITAMIN C) 1000 MG tablet Take 1,000 mg by mouth daily.   Yes [provider]  aspirin 81 MG EC tablet Take 81 mg by mouth daily.     Yes [provider]  atorvastatin (LIPITOR) 20 MG tablet Take 1 tablet (20 mg total) by mouth at bedtime. Patient taking differently: Take 20 mg by mouth daily.  04/10/17  Yes Rai, Ripudeep K, MD  Cholecalciferol (VITAMIN D3) 1000 UNITS CAPS Take 1,000 Units by mouth 2 (two) times daily.    Yes [provider]  clobetasol ointment (TEMOVATE) 8.56 % Apply 1 application topically 2 (two) times daily as needed (psoriasis).  05/15/17  Yes [provider]  gabapentin (NEURONTIN) 300 MG capsule Take 1 capsule (300 mg total) by mouth 3 (three) times daily. 04/10/17  Yes Rai, Ripudeep K, MD  hydrochlorothiazide (HYDRODIURIL) 25 MG tablet TAKE 1 TABLET DAILY Patient taking differently: Take 25 mg by mouth daily.    Yes Schoenhoff, Altamese Cabal, MD  insulin NPH-regular Human (NOVOLIN 70/30) (70-30) 100 UNIT/ML injection Inject 75 Units into the  skin 2 (two) times daily.    Yes [provider]  lisinopril (ZESTRIL) 10 MG tablet Take 10 mg by mouth 2 (two) times daily. 10/25/19  Yes [provider]  metFORMIN (GLUCOPHAGE-XR) 500 MG 24 hr tablet Take 500 mg by mouth 2 (two) times daily. 08/14/19  Yes [provider]  Multiple Vitamin (MULTIVITAMIN WITH MINERALS) TABS tablet Take 1 tablet by mouth daily.   Yes [provider]  Multiple Vitamins-Minerals (ZINC PO) Take 1 tablet by mouth daily.   Yes [provider]  naproxen sodium (ALEVE) 220 MG tablet Take 220 mg by mouth 2 (two) times daily as needed (pain/headaches).   Yes [provider]  pregabalin (LYRICA) 150 MG capsule Take 150 mg by mouth 2 (two) times daily.  10/25/19  Yes [provider]  Probiotic CAPS Take 1 capsule by mouth daily.   Yes [provider]  sertraline (ZOLOFT) 100 MG tablet Take 100 mg by mouth once daily Patient taking differently: Take 100 mg by mouth daily.  04/10/17  Yes Rai, Ripudeep K, MD  albuterol (PROVENTIL HFA;VENTOLIN HFA) 108 (90 BASE) MCG/ACT inhaler Inhale 2 puffs into the lungs every 6 (six) hours as  needed for wheezing.    [provider]  FLOVENT HFA 220 MCG/ACT inhaler Inhale 1 puff into the lungs in the morning and at bedtime.  10/10/19   [provider]  oxyCODONE (ROXICODONE) 5 MG immediate release tablet Take 1 tablet (5 mg total) by mouth every 8 (eight) hours as needed. 10/29/19 10/28/20  Serafina Mitchell, MD

## 2019-11-03 NOTE — Progress Notes (Addendum)
[facility]  Subjective  - Having trouble breathing still with anxiety.   Objective (!) 131/52 (!) 109 98.2 F (36.8 C) (Oral) (!) 26 92%  Intake/Output Summary (Last 24 hours) at 11/03/2019 0800 Last data filed at 11/03/2019 0648 Gross per 24 hour  Intake 1337.21 ml  Output 1800 ml  Net -462.79 ml    Doppler signals right peroneal and DP intact, motor intact, sensation intact. Medial thigh incision ss drainage no frank bleeding.  Left groin with Prevena  vac, no drainage in canister. O2 7 L supportive with albuterol treatments q 4-6 hours. O2 SAT 92 currently Speech is clear, A & O x3    Assessment/Planning: POD # 4 Left fem-BK pop bypass with vein and angioplasty of right fem-pop bypass.  Patent bypass.  SS drainge from medial thigh vein harvest incision dry dressing PRN, maintain left groin dressing vac seal.  She is a smoker I will add nicotine patch, this may assit her with her anxiety some. UO 1800 last 24 hours, +5,780 will re dosing of Lasix Leukocytosis 18 now 16 on IV antibiotics for B chest infiltrates HGB 8.3 now 8.1 after acute blood loss anemia 2 units  Blood gas 11/02/19 2300 Acidotic 7.3 with PO2 38.2 Herat NS 93 BPM, BP 825 systolic.  Antihypertensive home meds on hold, Cardizem drip 10 mg/hr Pending transfer to ICU   [name] [name] 11/03/2019 8:00 AM --  Agree with above.  ICU bed requested at 2 am this morning.  Apparently there are no ICU beds available.  The pulmonary critical care service has seen the pt this morning and believe she has combination of COPD exacerbation and pneumonia.  They are planning steroid course as well as PE CT.  Pt has been on SCDs.  Her troponin peaked at 5000 and is now trending down.  She is on diltiazem drip but still intermittently in rapid afib.  They are also ordering an ECHO to assess cardiac function.  The critical care team is triaging pts based on priority for ICU beds and I  have emphasized to them that this pt with declining pulmonary status rapid afib and overall declining condition warrants ICU care and they have assured me they will closely monitor her status and transfer to ICU if warranted.  Her bypass is currently patent.  She probably still has some hypervolemia and received another dose of lasix this morning will continue to diurese daily for now.  Continue antibiotics for pneumonia  [name], MD [facility] Office: [phone]    Laboratory Lab Results: Recent Labs    11/02/19 2239 11/03/19 0715  WBC 18.0* 16.1*  HGB 8.3* 8.1*  HCT 26.3* 26.2*  PLT 139* 124*   BMET Recent Labs    11/02/19 0209 11/02/19 2239  NA 131* 131*  K 4.4 4.4  CL 101 100  CO2 23 24  GLUCOSE 171* 151*  BUN 16 24*  CREATININE 0.55 0.76  CALCIUM 8.1* 8.6*    COAG Lab Results  Component Value Date   INR 1.1 10/30/2019   INR 1.08 06/26/2017   INR 1.02 04/07/2017   No results found for: PTT

## 2019-11-03 NOTE — Progress Notes (Signed)
Mobility Specialist: Progress Note   11/03/19 1346  Mobility  Activity  (Cancel)   Instructed by RN to not see pt today.   [facility] Eulonda Andalon Mobility Specialist

## 2019-11-03 NOTE — Progress Notes (Signed)
Spoke to [place] RT, stated this is a mixed venous blood gas. He was unable to get an arterial blood gas due to pt being uncooperative during stick.  Results for [name] [name], [name] [name] (MRN [id number]) as of 11/03/2019 00:19  Ref. Range 11/02/2019 23:02  Sample type Unknown ARTERIAL  FIO2 Unknown 44.00  pH, Arterial Latest Ref Range: 7.35 - 7.45  7.339 (L)  pCO2 arterial Latest Ref Range: 32 - 48 mmHg 43.1  pO2, Arterial Latest Ref Range: 83 - 108 mmHg 38.2 (LL)  Acid-base deficit Latest Ref Range: 0.0 - 2.0 mmol/L 2.3 (H)  Bicarbonate Latest Ref Range: 20.0 - 28.0 mmol/L 22.7  O2 Saturation Latest Units: % 69.7  Patient temperature Unknown 36.7  Collection site Unknown RIGHT RADIAL  Allens test (pass/fail) Latest Ref Range: PASS  PASS

## 2019-11-03 NOTE — Progress Notes (Signed)
[facility] Progress Note Patient Name: [name] [name] DOB: Sep 12, 1955 MRN: [id number]   Date of Service  11/03/2019  HPI/Events of Note  Arrives in ICU with hypoxia. Now on HFNC with Sat = 96% and RR = 22. Chest CTA - negative for PE. However, has ground glass opacities bilaterally. COVID negative on admission 5 days ago. Nursing request for ground team to evaluate the patient at bedside.   eICU Interventions  Plan: 1. 2 hour COVID test now.  2. Will ask ground team to evaluate at bedside.      Intervention Category Major Interventions: Hypoxemia - evaluation and management  Lysle Dingwall 11/03/2019, 11:29 PM

## 2019-11-03 NOTE — Progress Notes (Signed)
Not a rapid response event  Notified by nursing staff of pt with SOB. Upon arrival, [name] did endorse some SOB with minimal increased work of breathing and RR 22. Pt was given a PRN Duoneb and verbally de-escalated.

## 2019-11-03 NOTE — Progress Notes (Addendum)
Pt had wheezing and rhonchi bilaterally. SPO2 84-88% on 4 LPM of O2 NCL. Called Williams RT, Breathing treatment given. Pt continuously had de-satuartion and wheezing, We changed to HFNCL 7 LPM. Pt already got Lasix 120 mg since day shift and her urine output was 1,000 ml after Lasix given. Pt seemed to have respiratory distress, congested coughing with yellowish, tan and thick sputum. Dr. Oneida Alar notified. Order received for stat CBC, BMP, ABG, CXR, Troponin and transferring to ICU.   Kennyth Lose, RN

## 2019-11-03 NOTE — Progress Notes (Signed)
Notified Dr. Oneida Alar about lab result and updated Pt status that ICU bed is pending. Pt continued to have productive coughing with frothy sputum yellowish and tan. Order received for sputum culture. Called  RT for another breathing treatment due to Pt had stridor and expiratory and inspiratory wheezing and rhonchi bilaterally with SOB. SPO2 85-88%. RRT Shanon Brow made ware. Dr.Fields would prefer Pt to transfer to ICU.  Her SPO2 95% , RR 18-20 after breathing treatment given. NSR on monitor with right BBB. BP within normal limits. Remained afebrile. Continue to monitor.  Kennyth Lose, RN

## 2019-11-04 ENCOUNTER — Inpatient Hospital Stay (HOSPITAL_COMMUNITY): Payer: Medicare Other

## 2019-11-04 DIAGNOSIS — I214 Non-ST elevation (NSTEMI) myocardial infarction: Secondary | ICD-10-CM

## 2019-11-04 DIAGNOSIS — J9601 Acute respiratory failure with hypoxia: Secondary | ICD-10-CM | POA: Diagnosis not present

## 2019-11-04 DIAGNOSIS — I739 Peripheral vascular disease, unspecified: Secondary | ICD-10-CM

## 2019-11-04 DIAGNOSIS — R609 Edema, unspecified: Secondary | ICD-10-CM | POA: Diagnosis not present

## 2019-11-04 DIAGNOSIS — J441 Chronic obstructive pulmonary disease with (acute) exacerbation: Secondary | ICD-10-CM | POA: Diagnosis not present

## 2019-11-04 LAB — SARS CORONAVIRUS 2 BY RT PCR (HOSPITAL ORDER, PERFORMED IN ~~LOC~~ HOSPITAL LAB): SARS Coronavirus 2: NEGATIVE

## 2019-11-04 LAB — ECHOCARDIOGRAM COMPLETE
AR max vel: 2.41 cm2
AV Area VTI: 2.27 cm2
AV Area mean vel: 2.23 cm2
AV Mean grad: 11 mmHg
AV Peak grad: 21.2 mmHg
Ao pk vel: 2.3 m/s
Area-P 1/2: 4.36 cm2
Height: 67 in
S' Lateral: 3.1 cm
Weight: 4493.86 oz

## 2019-11-04 LAB — GLUCOSE, CAPILLARY
Glucose-Capillary: 164 mg/dL — ABNORMAL HIGH (ref 70–99)
Glucose-Capillary: 187 mg/dL — ABNORMAL HIGH (ref 70–99)
Glucose-Capillary: 156 mg/dL — ABNORMAL HIGH (ref 70–99)
Glucose-Capillary: 64 mg/dL — ABNORMAL LOW (ref 70–99)
Glucose-Capillary: 73 mg/dL (ref 70–99)
Glucose-Capillary: 84 mg/dL (ref 70–99)
Glucose-Capillary: 133 mg/dL — ABNORMAL HIGH (ref 70–99)

## 2019-11-04 LAB — EXPECTORATED SPUTUM ASSESSMENT W GRAM STAIN, RFLX TO RESP C

## 2019-11-04 LAB — PROCALCITONIN: Procalcitonin: <0.1 ng/mL

## 2019-11-04 MED ORDER — APIXABAN 5 MG PO TABS
5.0000 mg | ORAL_TABLET | Freq: Two times a day (BID) | ORAL | Status: DC
  Administered 2019-11-04 – 2019-11-16 (×25): 5 mg via ORAL
  Filled 2019-11-04 (×25): qty 1

## 2019-11-04 MED ORDER — ARFORMOTEROL TARTRATE 15 MCG/2ML IN NEBU
15.0000 ug | INHALATION_SOLUTION | Freq: Two times a day (BID) | RESPIRATORY_TRACT | Status: DC
  Administered 2019-11-04 – 2019-11-16 (×25): 15 ug via RESPIRATORY_TRACT
  Filled 2019-11-04 (×25): qty 2

## 2019-11-04 MED ORDER — FUROSEMIDE 10 MG/ML IJ SOLN
60.0000 mg | Freq: Four times a day (QID) | INTRAMUSCULAR | Status: AC
  Administered 2019-11-04 (×2): 60 mg via INTRAVENOUS
  Filled 2019-11-04 (×2): qty 6

## 2019-11-04 MED ORDER — PREDNISONE 20 MG PO TABS
40.0000 mg | ORAL_TABLET | Freq: Every day | ORAL | Status: DC
  Administered 2019-11-04 – 2019-11-06 (×3): 40 mg via ORAL
  Filled 2019-11-04 (×4): qty 2

## 2019-11-04 MED ORDER — PERFLUTREN LIPID MICROSPHERE
1.0000 mL | INTRAVENOUS | Status: AC | PRN
  Administered 2019-11-04: 2 mL via INTRAVENOUS
  Filled 2019-11-04: qty 10

## 2019-11-04 MED ORDER — METOPROLOL TARTRATE 50 MG PO TABS
50.0000 mg | ORAL_TABLET | Freq: Two times a day (BID) | ORAL | Status: DC
  Administered 2019-11-04 – 2019-11-16 (×25): 50 mg via ORAL
  Filled 2019-11-04 (×25): qty 1

## 2019-11-04 MED ORDER — METHYLPREDNISOLONE SODIUM SUCC 125 MG IJ SOLR: 60.0000 mg | Freq: Two times a day (BID) | INTRAMUSCULAR | Status: DC

## 2019-11-04 MED ORDER — REVEFENACIN 175 MCG/3ML IN SOLN
175.0000 ug | Freq: Every day | RESPIRATORY_TRACT | Status: DC
  Administered 2019-11-04 – 2019-11-16 (×12): 175 ug via RESPIRATORY_TRACT
  Filled 2019-11-04 (×15): qty 3

## 2019-11-04 MED ORDER — BUDESONIDE 0.5 MG/2ML IN SUSP
0.5000 mg | Freq: Two times a day (BID) | RESPIRATORY_TRACT | Status: DC
  Administered 2019-11-04 – 2019-11-16 (×25): 0.5 mg via RESPIRATORY_TRACT
  Filled 2019-11-04 (×25): qty 2

## 2019-11-04 NOTE — Progress Notes (Signed)
F/u covid swab came back negative. Also, pt reports receiving both Pfizer covid vaccine shots back in April 2021 (though it's not listed in chart).   Given pt's HPI, increased BLE edema past two days along with pulmonary edema requiring lasix 8/15, seems etiology of pt's resp distress likely PNA vs. HF w/concurrent COPD exacerbation. Echo already ordered to be done today.  Will continue to monitor closely.

## 2019-11-04 NOTE — Progress Notes (Signed)
RT instructed patient on the use of flutter valve. Patient able to demonstrate back good technique.

## 2019-11-04 NOTE — Progress Notes (Addendum)
  Progress Note    11/04/2019 7:54 AM 5 Days Post-Op  Subjective:  Still having trouble breathing. RT in room with breathing treatments in progress  BP 122/61 HR 88 NS on monitor RR 24 O2 SAT 94% 10L HFNC pt remains afebrile  Vitals:   11/04/19 0735 11/04/19 0740  BP: 122/61   Pulse: 88   Resp: (!) 24   Temp:    SpO2: 93% 94%   Physical Exam: Cardiac:  regaular Lungs: tachypneic, presently getting nebulizer treatment Incisions:  Left lower extremity incisions intact. Serosanguinous drainage from incisions. Left groin Prevena intact with good seal Extremities:  Bilateral lower extremities well perfused and warm.  Abdomen:  Obese, soft, non tender Neurologic: alert and oriented  CBC    Component Value Date/Time   WBC 16.1 (H) 11/03/2019 0715   RBC 3.07 (L) 11/03/2019 0715   HGB 8.1 (L) 11/03/2019 0715   HCT 26.2 (L) 11/03/2019 0715   PLT 124 (L) 11/03/2019 0715   MCV 85.3 11/03/2019 0715   MCH 26.4 11/03/2019 0715   MCHC 30.9 11/03/2019 0715   RDW 16.9 (H) 11/03/2019 0715   LYMPHSABS 1.7 11/02/2019 2239   MONOABS 1.5 (H) 11/02/2019 2239   EOSABS 0.1 11/02/2019 2239   BASOSABS 0.0 11/02/2019 2239    BMET    Component Value Date/Time   NA 130 (L) 11/03/2019 0715   K 4.4 11/03/2019 0715   CL 99 11/03/2019 0715   CO2 23 11/03/2019 0715   GLUCOSE 181 (H) 11/03/2019 0715   BUN 26 (H) 11/03/2019 0715   CREATININE 0.67 11/03/2019 0715   CREATININE 0.69 01/28/2013 1110   CALCIUM 8.6 (L) 11/03/2019 0715   GFRNONAA >60 11/03/2019 0715   GFRAA >60 11/03/2019 0715    INR    Component Value Date/Time   INR 1.1 10/30/2019 0651     Intake/Output Summary (Last 24 hours) at 11/04/2019 0754 Last data filed at 11/04/2019 0600 Gross per 24 hour  Intake 1639.76 ml  Output 3370 ml  Net -1730.24 ml     Assessment/Plan:  64 y.o. female is s/p Left fem-BK pop bypass with vein and angioplasty of right fem-pop bypass 5 Days Post-Op. Transferred to ICU due to  progressive worsening of pulmonary status. preciate CCM assistance Left lower extremity bypass is patent. She has Prevena wound VAC left groin with good seal. Left lower extremity incisions with serosanguinous drainage. Second Prevena wound VAC dressing ordered for lower leg incision. Leukocytosis trending down- on IV Cefepime and Vanc. HGB stable. Echo is pending. Started on Eliquis. SCDs bilaterally. BB started for afib. Trying to wean CCB drip. PT/ mobilize as tolerated with her respiratory status    Karoline Caldwell, [place] [facility] [phone] 11/04/2019 7:54 AM   I agree with the above.  I have seen and examined the patient.   Patient now on ASA, Plavix, and Eliquis, will need to monitor for bleeding Will place incisional vac to help with drainage Appreciate CCM assistance Continue to mobilize  [employer]

## 2019-11-04 NOTE — Progress Notes (Signed)
Report given to Vivan at 2221. Patient is stable on 7L of O2. Pt is A&O x4, rechecked vital signs before transfer:   BP 122/53 HR 94 NS on monitor, RR 18, O2 SAT 99% 7L HFNC, pt remains afebrile. Patient transferred from 4E04 at 2242 to 2H26, by two staff nurses, all pt belongings transferred and pt experienced no distress during transfer. Lacretia Leigh RN

## 2019-11-04 NOTE — Progress Notes (Signed)
Physical Therapy Treatment Patient Details Name: [name] [name] MRN: [id number] DOB: 01-08-1956 Today's Date: 11/04/2019    History of Present Illness 64 y.o. female presenting with Lt leg ulcer, occlusion of R femoral-popliteal bypass graft s/p left femoral-popliteal BPG with groin wound VAC and Rt leg balloon angioplasty SFA . 8/14 pt with AFib with new RBBB, 8/15 progressive hypoxemia. PMHx significant for R femoral-popliteal bypass graf on 04/07/2017, L carotid endartectomy on 06/30/2017, COPD, depression, DM, HLD, HTN, obesity, and tobacco use.    PT Comments    Pt pleasant on 15L HFNC with SpO2 89-90% with HOB 30 degrees and throughout bil LE HEP. With bil UE use and pulling forward into full sitting x 10 reps pt with drop to 85%. Pt with distal wound VAC without suction and pt report of PA to return to further address and did not attempt OOB due to pulmonary status as well as VAC not functioning. Pt educated for HEP, IS use and continued mobility progression. Will continue to follow.     Follow Up Recommendations  Home health PT;Supervision for mobility/OOB     Equipment Recommendations  Rolling walker with 5" wheels    Recommendations for Other Services       Precautions / Restrictions Precautions Precautions: Fall Precaution Comments: wound vac x 3, watch sats    Mobility  Bed Mobility               General bed mobility comments: pt able to pull to full sitting with HOB 30 degrees with bil railing. transfers unable to be attempted as issues with wound VACs and awaiting PA return to further address [facility]  Transfers                    Ambulation/Gait                 Stairs             Wheelchair Mobility    Modified Rankin (Stroke Patients Only)       Balance                                            Cognition Arousal/Alertness: Awake/alert Behavior During Therapy: WFL for tasks assessed/performed Overall  Cognitive Status: Within Functional Limits for tasks assessed                                        Exercises General Exercises - Lower Extremity Ankle Circles/Pumps: AROM;10 reps;Seated;Both Long Arc Quad: AROM;Both;Other (comment) (chair position in bed) Hip ABduction/ADduction: AROM;Right;Seated Hip Flexion/Marching: AROM;Both;Seated;10 reps    General Comments        Pertinent Vitals/Pain Pain Score: 5  Pain Location: LLE with movement Pain Descriptors / Indicators: Sore;Aching Pain Intervention(s): Limited activity within patient's tolerance;Monitored during session    Home Living                      Prior Function            PT Goals (current goals can now be found in the care plan section) Progress towards PT goals: Not progressing toward goals - comment (unable to progress OOB due to respiratory status and wound VAC issues)    Frequency    Min 3X/week  PT Plan Current plan remains appropriate    Co-evaluation              AM-PAC PT "6 Clicks" Mobility   Outcome Measure  Help needed turning from your back to your side while in a flat bed without using bedrails?: A Little Help needed moving from lying on your back to sitting on the side of a flat bed without using bedrails?: A Little Help needed moving to and from a bed to a chair (including a wheelchair)?: A Little Help needed standing up from a chair using your arms (e.g., wheelchair or bedside chair)?: A Little Help needed to walk in hospital room?: A Little Help needed climbing 3-5 steps with a railing? : A Lot 6 Click Score: 17    End of Session Equipment Utilized During Treatment: Oxygen Activity Tolerance: Patient tolerated treatment well Patient left: in bed;with call bell/phone within reach Nurse Communication: Mobility status PT Visit Diagnosis: Unsteadiness on feet (R26.81);Muscle weakness (generalized) (M62.81);Other abnormalities of gait and mobility  (R26.89)     Time: 5681-2751 PT Time Calculation (min) (ACUTE ONLY): 22 min  Charges:  $Therapeutic Exercise: 8-22 mins                     Matrice Herro P, PT Acute Rehabilitation Services Pager: [phone] Office: [phone]    Baani Bober B Joevon Holliman 11/04/2019, 12:06 PM

## 2019-11-04 NOTE — Progress Notes (Signed)
Lower extremity venous bilateral and ABI studies completed.   Please see CV Proc for preliminary results.   Darlin Coco

## 2019-11-04 NOTE — Progress Notes (Signed)
Hypoglycemic Event  CBG: 64  Treatment: cranberry juice, meal tray   Symptoms: none  Follow-up CBG: Time: 1702 CBG Result: 73  Possible Reasons for Event: high dose 70/30 insulin   Comments/MD notified: notified pharmacy - encouraged patient to eat entire meal tray as tolerated    [name]

## 2019-11-04 NOTE — Progress Notes (Signed)
     Placed additional prevena wound vac to left medial incisions.   Roxy Horseman PA-C

## 2019-11-04 NOTE — Progress Notes (Signed)
  Echocardiogram 2D Echocardiogram has been performed.  Jennette Dubin 11/04/2019, 8:53 AM

## 2019-11-04 NOTE — Consult Note (Signed)
NAME:  [name] [name], MRN:  [id number], DOB:  [birth year], LOS: 5 ADMISSION DATE:  10/30/2019, CONSULTATION DATE:  11/03/2018 REFERRING MD:  Eudelia Bunch, CHIEF COMPLAINT:  SOB with progressive hypoxemia   Brief History   This is a 64 y.o. W F admitted for surgical management of a left leg ulcer and occlusion of a right femoral-popliteal bypass graft. The patient underwent surgical intervention on 8/11 with: 1) Left femoral to below-knee popliteal artery bypass with ipsilateral nonreversed saphenous vein, 2) Left external iliac, common femoral, profundofemoral endarterectomy, 3) Right lower extremity angiogram, 4) Balloon angioplasty, right femoral-popliteal bypass and, 5) Left groin Prevena wound VAC.  On 8/14, the patient had an episode of A-Fib with RVR with an elevated troponin (5084) that converted to NSR with Cardizem. ECG 8/1-8/14 shows a new RBBB c/w ECG 04/04/2017. The patient, although obese, has not been on consistent DVT prophylaxis and currently not on SCDs or anticoagulants. CXR (0805) showed patchy airspace opacity is noted throughout both lungs as well as some interstitial edema. However, later CXR at 1613 progressed with "moderate severity infiltrate is noted within the mid right lung and lateral aspect of the right lung base, which is increased in severity when compared to the prior exam." Cefipime and vanc was initiated for possible HAP with WBC albeit elevated but showing no consistent trend from 8/11 thru 8/15.  Early AM was seen by RRT for SOB and given a Duoneb. Patient remains borderline hypoxemic on nasal cannula. She does acknowledge at least a 40 pk-yr tobacco hx and continues to smoke 1ppd. She is currently managed OPD with Flovent + albuterol. Although she is net 5780 mL since admission, she was net negative during the period that her CXR worsened. PCCM is being consulted for progressive hypoxemia and SOB  Past Medical History   Past Medical History:  Diagnosis Date  .  Abdominal abscess   . Allergic rhinitis   . Asthma   . Bacteremia 03/2017  . Carotid stenosis, left 04/2009   50-69%  . Colon polyps 12/05  . COPD (chronic obstructive pulmonary disease) ([place])   . Depression   . Diabetes mellitus    followed by Dr. Chalmers Cater  . Folate deficiency   . Hyperlipidemia   . Hypertension   . Lumbar disc disease    h/o hnp with repair  . Obesity, morbid (more than 100 lbs over ideal weight or BMI > 40) (HCC)   . Peripheral vascular disease ([place])   . Psoriasis   . Thrombocytopenia ([place])    work up with Dr. Griffith Citron neg in 2000  . Tobacco abuse     Consults:  8/15 PCCM  Procedures:  As noted above  Micro Data:  8/15: Sputum culture  Antimicrobials:  8/14 Cefipime 8/14 Vanc   Interim history/subjective:  No events. Feels a bit better. Still wheezing and coughing, minimal productive sputum.  Objective   Blood pressure 140/63, pulse 75, temperature 97.7 F (36.5 C), temperature source Oral, resp. rate 13, height 5\' 7"  (1.702 m), weight 127.4 kg, SpO2 95 %.        Intake/Output Summary (Last 24 hours) at 11/04/2019 0707 Last data filed at 11/04/2019 0600 Gross per 24 hour  Intake 1639.76 ml  Output 3370 ml  Net -1730.24 ml   Filed Weights   10/30/19 0704 11/03/19 0600 11/04/19 0400  Weight: 116.6 kg 129.3 kg 127.4 kg    Examination: GEN: overweight woman in NAD HEENT: malampatti 3 CV: RRR, ext warm PULM: wheezing  bilaterally, no accessory muscle use GI: Soft, +BS EXT: wrapped NEURO: moves all 4 ext to command PSYCH: RASS 0 SKIN: No rashes  Echo pending Stable bilateral airspace disease on CXR   Assessment & Plan:  Acute hypoxemic respiratory failure- differential is some combination HCAP, volume overload, AECOPD - Check Pct, MRSA swab,  - Push diuresis, monitor electrolytes - Nebs to yupelri/brovana/pulmicort - Flutter, IS - Switch to PO steroids  Afib/RVR, trop leak, ?new BBB - Start PO BB - Wean CCB  drip - Echo pending, f/u LE DVT - Start eliquis  PAD, post op status- per primary  Muscular deconditioning- PT  Will follow with you    The patient is critically ill with multiple organ systems failure and requires high complexity decision making for assessment and support, frequent evaluation and titration of therapies, application of advanced monitoring technologies and extensive interpretation of multiple databases. Critical Care Time devoted to patient care services described in this note independent of APP/resident time (if applicable)  is 43 minutes.   Erskine Emery MD [facility] 11/04/2019 7:17 AM Personal pager: [phone] If unanswered, please page CCM On-call: [phone]

## 2019-11-05 ENCOUNTER — Inpatient Hospital Stay (HOSPITAL_COMMUNITY): Payer: Medicare Other

## 2019-11-05 DIAGNOSIS — J9601 Acute respiratory failure with hypoxia: Secondary | ICD-10-CM | POA: Diagnosis not present

## 2019-11-05 LAB — BASIC METABOLIC PANEL
Anion gap: 8 (ref 5–15)
BUN: 29 mg/dL — ABNORMAL HIGH (ref 8–23)
CO2: 27 mmol/L (ref 22–32)
Calcium: 9 mg/dL (ref 8.9–10.3)
Chloride: 98 mmol/L (ref 98–111)
Creatinine, Ser: 0.67 mg/dL (ref 0.44–1.00)
GFR calc Af Amer: >60 mL/min (ref >60)
GFR calc non Af Amer: >60 mL/min (ref >60)
Glucose, Bld: 101 mg/dL — ABNORMAL HIGH (ref 70–99)
Potassium: 4.4 mmol/L (ref 3.5–5.1)
Sodium: 133 mmol/L — ABNORMAL LOW (ref 135–145)

## 2019-11-05 LAB — GLUCOSE, CAPILLARY
Glucose-Capillary: 106 mg/dL — ABNORMAL HIGH (ref 70–99)
Glucose-Capillary: 164 mg/dL — ABNORMAL HIGH (ref 70–99)
Glucose-Capillary: 223 mg/dL — ABNORMAL HIGH (ref 70–99)
Glucose-Capillary: 280 mg/dL — ABNORMAL HIGH (ref 70–99)
Glucose-Capillary: 38 mg/dL — CL (ref 70–99)
Glucose-Capillary: 74 mg/dL (ref 70–99)
Glucose-Capillary: 75 mg/dL (ref 70–99)
Glucose-Capillary: 91 mg/dL (ref 70–99)
Glucose-Capillary: 98 mg/dL (ref 70–99)

## 2019-11-05 LAB — CBC
HCT: 28.1 % — ABNORMAL LOW (ref 36.0–46.0)
Hemoglobin: 8.7 g/dL — ABNORMAL LOW (ref 12.0–15.0)
MCH: 26.9 pg (ref 26.0–34.0)
MCHC: 31 g/dL (ref 30.0–36.0)
MCV: 86.7 fL (ref 80.0–100.0)
Platelets: 168 10*3/uL (ref 150–400)
RBC: 3.24 MIL/uL — ABNORMAL LOW (ref 3.87–5.11)
RDW: 17.2 % — ABNORMAL HIGH (ref 11.5–15.5)
WBC: 14.5 10*3/uL — ABNORMAL HIGH (ref 4.0–10.5)
nRBC: 0.4 % — ABNORMAL HIGH (ref 0.0–0.2)

## 2019-11-05 LAB — HEMOGLOBIN A1C
Hgb A1c MFr Bld: 6.4 % — ABNORMAL HIGH (ref 4.8–5.6)
Mean Plasma Glucose: 136.98 mg/dL

## 2019-11-05 LAB — MAGNESIUM: Magnesium: 2.1 mg/dL (ref 1.7–2.4)

## 2019-11-05 LAB — PHOSPHORUS: Phosphorus: 3.2 mg/dL (ref 2.5–4.6)

## 2019-11-05 MED ORDER — INSULIN ASPART 100 UNIT/ML ~~LOC~~ SOLN
0.0000 [IU] | Freq: Three times a day (TID) | SUBCUTANEOUS | Status: DC
  Administered 2019-11-06: 20 [IU] via SUBCUTANEOUS
  Administered 2019-11-07: 7 [IU] via SUBCUTANEOUS
  Administered 2019-11-07: 11 [IU] via SUBCUTANEOUS
  Administered 2019-11-08: 3 [IU] via SUBCUTANEOUS
  Administered 2019-11-08: 11 [IU] via SUBCUTANEOUS
  Administered 2019-11-09 (×2): 7 [IU] via SUBCUTANEOUS
  Administered 2019-11-10: 4 [IU] via SUBCUTANEOUS
  Administered 2019-11-10 – 2019-11-11 (×2): 7 [IU] via SUBCUTANEOUS
  Administered 2019-11-12: 3 [IU] via SUBCUTANEOUS
  Administered 2019-11-12: 4 [IU] via SUBCUTANEOUS
  Administered 2019-11-12: 3 [IU] via SUBCUTANEOUS
  Administered 2019-11-13: 4 [IU] via SUBCUTANEOUS
  Administered 2019-11-13 – 2019-11-14 (×3): 3 [IU] via SUBCUTANEOUS
  Administered 2019-11-14: 4 [IU] via SUBCUTANEOUS
  Administered 2019-11-15: 11 [IU] via SUBCUTANEOUS
  Administered 2019-11-16: 3 [IU] via SUBCUTANEOUS

## 2019-11-05 MED ORDER — INSULIN ASPART PROT & ASPART (70-30 MIX) 100 UNIT/ML ~~LOC~~ SUSP
40.0000 [IU] | Freq: Every day | SUBCUTANEOUS | Status: DC
  Administered 2019-11-05 – 2019-11-10 (×6): 40 [IU] via SUBCUTANEOUS
  Filled 2019-11-05 (×2): qty 10

## 2019-11-05 MED ORDER — FUROSEMIDE 10 MG/ML IJ SOLN
60.0000 mg | Freq: Four times a day (QID) | INTRAMUSCULAR | Status: AC
  Administered 2019-11-05 (×2): 60 mg via INTRAVENOUS
  Filled 2019-11-05 (×2): qty 6

## 2019-11-05 MED ORDER — QUETIAPINE FUMARATE 25 MG PO TABS
25.0000 mg | ORAL_TABLET | Freq: Two times a day (BID) | ORAL | Status: DC
  Filled 2019-11-05: qty 1

## 2019-11-05 MED ORDER — ATORVASTATIN CALCIUM 40 MG PO TABS
40.0000 mg | ORAL_TABLET | Freq: Every day | ORAL | Status: DC
  Administered 2019-11-05 – 2019-11-16 (×12): 40 mg via ORAL
  Filled 2019-11-05 (×12): qty 1

## 2019-11-05 MED ORDER — PREGABALIN 75 MG PO CAPS: 150.0000 mg | ORAL_CAPSULE | Freq: Two times a day (BID) | ORAL | Status: DC

## 2019-11-05 MED ORDER — FUROSEMIDE 10 MG/ML IJ SOLN
40.0000 mg | Freq: Once | INTRAMUSCULAR | Status: AC
  Administered 2019-11-05: 40 mg via INTRAVENOUS
  Filled 2019-11-05: qty 4

## 2019-11-05 MED ORDER — ALBUTEROL SULFATE (2.5 MG/3ML) 0.083% IN NEBU
2.5000 mg | INHALATION_SOLUTION | RESPIRATORY_TRACT | Status: DC
  Administered 2019-11-05 – 2019-11-06 (×7): 2.5 mg via RESPIRATORY_TRACT
  Filled 2019-11-05 (×8): qty 3

## 2019-11-05 MED ORDER — ALPRAZOLAM 0.25 MG PO TABS
0.2500 mg | ORAL_TABLET | Freq: Once | ORAL | Status: AC | PRN
  Administered 2019-11-16: 0.25 mg via ORAL
  Filled 2019-11-05: qty 1

## 2019-11-05 MED ORDER — INSULIN ASPART 100 UNIT/ML ~~LOC~~ SOLN
0.0000 [IU] | Freq: Every day | SUBCUTANEOUS | Status: DC
  Administered 2019-11-05 – 2019-11-14 (×2): 2 [IU] via SUBCUTANEOUS

## 2019-11-05 NOTE — Progress Notes (Signed)
[facility] Progress Note Patient Name: [name] [name] DOB: 1955-08-18 MRN: [id number]   Date of Service  11/05/2019  HPI/Events of Note  Hyperglycemia - Now on PO diet.   eICU Interventions  Will change from Q 4 hour resistant Novolog SSI to AC/HS resistant SSI.     Intervention Category Major Interventions: Hyperglycemia - active titration of insulin therapy  Lysle Dingwall 11/05/2019, 8:04 PM

## 2019-11-05 NOTE — Progress Notes (Signed)
NAME:  [name] [name], MRN:  [id number], DOB:  27-Jul-1955, LOS: 6 ADMISSION DATE:  10/30/2019, CONSULTATION DATE:  11/03/2018 REFERRING MD:  Eudelia Bunch, CHIEF COMPLAINT:  SOB with progressive hypoxemia   Brief History   This is a 64 y.o. W F admitted for surgical management of a left leg ulcer and occlusion of a right femoral-popliteal bypass graft. The patient underwent surgical intervention on 8/11 with: 1) Left femoral to below-knee popliteal artery bypass with ipsilateral nonreversed saphenous vein, 2) Left external iliac, common femoral, profundofemoral endarterectomy, 3) Right lower extremity angiogram, 4) Balloon angioplasty, right femoral-popliteal bypass and, 5) Left groin Prevena wound VAC.  On 8/14, the patient had an episode of A-Fib with RVR with an elevated troponin (5084) that converted to NSR with Cardizem. ECG 8/1-8/14 shows a new RBBB c/w ECG 04/04/2017. The patient, although obese, has not been on consistent DVT prophylaxis and currently not on SCDs or anticoagulants. CXR (0805) showed patchy airspace opacity is noted throughout both lungs as well as some interstitial edema. However, later CXR at 1613 progressed with "moderate severity infiltrate is noted within the mid right lung and lateral aspect of the right lung base, which is increased in severity when compared to the prior exam." Cefipime and vanc was initiated for possible HAP with WBC albeit elevated but showing no consistent trend from 8/11 thru 8/15.  Early AM was seen by RRT for SOB and given a Duoneb. Patient remains borderline hypoxemic on nasal cannula. She does acknowledge at least a 40 pk-yr tobacco hx and continues to smoke 1ppd. She is currently managed OPD with Flovent + albuterol. Although she is net 5780 mL since admission, she was net negative during the period that her CXR worsened. PCCM is being consulted for progressive hypoxemia and SOB  Past Medical History   Past Medical History:  Diagnosis Date  .  Abdominal abscess   . Allergic rhinitis   . Asthma   . Bacteremia 03/2017  . Carotid stenosis, left 04/2009   50-69%  . Colon polyps 12/05  . COPD (chronic obstructive pulmonary disease) ([place])   . Depression   . Diabetes mellitus    followed by Dr. Chalmers Cater  . Folate deficiency   . Hyperlipidemia   . Hypertension   . Lumbar disc disease    h/o hnp with repair  . Obesity, morbid (more than 100 lbs over ideal weight or BMI > 40) (HCC)   . Peripheral vascular disease ([place])   . Psoriasis   . Thrombocytopenia ([place])    work up with Dr. Griffith Citron neg in 2000  . Tobacco abuse     Consults:  8/15 PCCM  Procedures:  As noted above  Micro Data:  8/15: Sputum culture  Antimicrobials:  8/14 Cefipime 8/14 Vanc   Interim history/subjective:  Had a rough night.  Required BIPAP. Anxious, paranoid, mad about her wound vacs. She states breathing is improved.  Objective   Blood pressure (!) 171/55, pulse 94, temperature 98.4 F (36.9 C), temperature source Oral, resp. rate (!) 22, height 5\' 7"  (1.702 m), weight 127.3 kg, SpO2 100 %.    FiO2 (%):  [45 %] 45 %   Intake/Output Summary (Last 24 hours) at 11/05/2019 0717 Last data filed at 11/05/2019 0645 Gross per 24 hour  Intake 184.74 ml  Output 3300 ml  Net -3115.26 ml   Filed Weights   11/03/19 0600 11/04/19 0400 11/05/19 0600  Weight: 129.3 kg 127.4 kg 127.3 kg    Examination: GEN:  overweight woman in NAD HEENT: malampatti 3 CV: RRR, ext warm PULM: wheezing bilaterally, no accessory muscle use GI: Soft, +BS EXT: wound vacs in place NEURO: moves all 4 ext to command PSYCH: RASS 0 SKIN: No rashes  Echo grade 2 diastolic dysfunction LE duplex neg -3L I/O  Assessment & Plan:  Acute hypoxemic respiratory failure- differential is some combination volume overload, AECOPD - Pct neg, d/c abx  - Push diuresis, monitor electrolytes - yupelri/brovana/pulmicort - Flutter, IS - PO steroids - BIPAP qHS and  PRN - Keep in ICU while tenuous resp status  Afib/RVR, trop leak, ?new BBB - Start PO BB - Wean CCB drip - Continue eliquis  PAD, post op status- per primary  Muscular deconditioning- PT  Anxiety, depression, paranoia, question developing ICU delirium - Restart home lyrica - Continue PTA zoloft, gabapentin - Trial of low dose seroquel  Will follow with you    The patient is critically ill with multiple organ systems failure and requires high complexity decision making for assessment and support, frequent evaluation and titration of therapies, application of advanced monitoring technologies and extensive interpretation of multiple databases. Critical Care Time devoted to patient care services described in this note independent of APP/resident time (if applicable)  is 33 minutes.   Erskine Emery MD [facility] 11/05/2019 7:17 AM Personal pager: [phone] If unanswered, please page CCM On-call: [phone]

## 2019-11-05 NOTE — Progress Notes (Signed)
The chaplain visited as a result of a page. The patient expressed to the chaplain that they were angry about receiving a treatment without consent. The chaplain spoke with the patient about autonomy and advocacy and the patient expressed that they are ok now and just wants to get better. The chaplain prayed with the patient who was very appreciative of the visit and the prayer. The chaplain is available for follow-up if needed.  Brion Aliment Chaplain Resident For questions concerning this note please contact me by pager [phone]

## 2019-11-05 NOTE — Progress Notes (Signed)
Inpatient Diabetes Program Recommendations  AACE/ADA: New Consensus Statement on Inpatient Glycemic Control (2015)  Target Ranges:  Prepandial:   less than 140 mg/dL      Peak postprandial:   less than 180 mg/dL (1-2 hours)      Critically ill patients:  140 - 180 mg/dL   Lab Results  Component Value Date   GLUCAP 38 (LL) 11/05/2019   HGBA1C 8.0 (H) 01/28/2013    Review of Glycemic Control Results for REBEKKA, LOBELLO (MRN [id number]) as of 11/05/2019 13:27  Ref. Range 11/05/2019 00:01 11/05/2019 04:58 11/05/2019 06:48 11/05/2019 08:06 11/05/2019 11:52  Glucose-Capillary Latest Ref Range: 70 - 99 mg/dL 98 75 74 91 38 (LL)    Inpatient Diabetes Program Recommendations:   -Decrease 70/30 insulin to 60 units bid  or change to Levemir bid + Novolog meal coverage tid  Thank you, Nani Gasser. [place], RN, MSN, CDE  Diabetes Coordinator Inpatient Glycemic Control Team Team Pager [phone] (8am-5pm) 11/05/2019 2:42 PM

## 2019-11-05 NOTE — Progress Notes (Addendum)
[facility]  Subjective  - Very anxious and upset.  Feels like she is losing control.   Objective (!) 171/55 94 98.4 F (36.9 C) (Oral) (!) 22 100%  Intake/Output Summary (Last 24 hours) at 11/05/2019 0717 Last data filed at 11/05/2019 0645 Gross per 24 hour  Intake 184.74 ml  Output 3300 ml  Net -3115.26 ml   B LE warm with intact motor and well perfused Left medial leg and groin with incisional vacs to suction Heart sinus rhythm with PVC Lungs working to breath at times O2 [place] support with intermitting breathing treatments.   Chest X ray 11/05/19 PORTABLE CHEST 1 VIEW  COMPARISON:  CT 11/03/2019.  Chest x-ray 14 2021.  FINDINGS: Stable cardiomegaly. Diffuse severe bilateral pulmonary infiltrates/edema again noted. No significant interim change. Low lung volumes. No pleural effusion or pneumothorax.  IMPRESSION: 1.  Stable cardiomegaly.  2. Diffuse severe bilateral pulmonary infiltrates/edema again noted. No significant interim change.  ABI biphasic right LE, left improved from 0.2 to 0.5  Venous duplex was negative for DVT B LE  Assessment/Planning: 64 y.o. female is s/p Left fem-BK pop bypass with vein and angioplasty of right fem-pop bypass 5 Days Post-Op.    Transferred to ICU 11/03/19 Leukocytosis trending down- on IV Cefepime and Vanc.  Fluid over load with diffuse B pulmonary infiltrates.   Incisional wound vacs in place to suction.   CCM started prednisone for her lungs and Seroquel for anxiety.  Nicotine patch was started yesterday for smoking cessation.   We may ask a clergy to come speak to patient for additional support.    Roxy Horseman 11/05/2019 7:17 AM --  Laboratory Lab Results: Recent Labs    11/03/19 0715 11/05/19 0043  WBC 16.1* 14.5*  HGB 8.1* 8.7*  HCT 26.2* 28.1*  PLT 124* 168   BMET Recent Labs    11/03/19 0715 11/05/19 0043  NA 130* 133*  K 4.4 4.4  CL 99 98  CO2 23 27  GLUCOSE  181* 101*  BUN 26* 29*  CREATININE 0.67 0.67  CALCIUM 8.6* 9.0    COAG Lab Results  Component Value Date   INR 1.1 10/30/2019   INR 1.08 06/26/2017   INR 1.02 04/07/2017   No results found for: PTT  I agree with the above.  Ihave seen and evaluated the patient AFIB:  On Eliquis and PO Beta blocker.  HR around 100 COPD:  sats in high 90's on [place], continue nebs, PO steroids, and IS VASC:  Vac to vein harvest site.  Extremities warm Needs to get OOB and ambulate   Wells Foy Vanduyne

## 2019-11-05 NOTE — Progress Notes (Signed)
PCCM INTERVAL PROGRESS NOTE   Called to bedside to evaluate patient in respiratory distress.   Breathing has been worse over the course of the night despite diuresis.   Somewhat improved with breathing treatment, however, still breathing 40x per min with coarse wheeze bilateral anterior lung fields   Acute hypoxemic respiratory failure- differential is some combination HCAP, volume overload, AECOPD - Start BiPAP for work of breathing.  - Continue prednisone - AM labs reviewed, will order additional 40mg  lasix.     Georgann Housekeeper, AGACNP-BC [place]  See Amion for personal pager PCCM on call pager [phone]  11/05/2019 6:07 AM

## 2019-11-05 NOTE — Progress Notes (Signed)
Occupational Therapy Treatment Patient Details Name: [name] [name] MRN: [id number] DOB: 02/14/1956 Today's Date: 11/05/2019    History of present illness 64 y.o. female presenting with Lt leg ulcer, occlusion of R femoral-popliteal bypass graft s/p left femoral-popliteal BPG with groin wound VAC and Rt leg balloon angioplasty SFA . 8/14 pt with AFib with new RBBB, 8/15 progressive hypoxemia. PMHx significant for R femoral-popliteal bypass graf on 04/07/2017, L carotid endartectomy on 06/30/2017, COPD, depression, DM, HLD, HTN, obesity, and tobacco use.   OT comments  Pt up in recliner upon arrival, initially very lethargic which improves with increased stimuli start of session. Assisted with return to bed during session, pt performing functional transfers using RW, overall requiring minA (+2) for safe completion of functional transfers. Pt with notable weeping/leaking from wound vac on LLE, informed RN and already aware. Pt oriented during session but having hallucinations end of session when in bed (seeing floral wallpaper which was moving). SpO2 maintaining >/=90% on 12L HFNC, BP start of session 161/70 (95), end of session 150/66 (92). Will continue per POC at this time.   Follow Up Recommendations  Home health OT;Supervision/Assistance - 24 hour    Equipment Recommendations  3 in 1 bedside commode          Precautions / Restrictions Precautions Precautions: Fall Precaution Comments: wound vac x 3, watch sats       Mobility Bed Mobility Overal bed mobility: Needs Assistance Bed Mobility: Sit to Supine       Sit to supine: Mod assist;+2 for physical assistance;+2 for safety/equipment   General bed mobility comments: assist to guide trunk and for LEs onto bed  Transfers Overall transfer level: Needs assistance Equipment used: Rolling walker (2 wheeled) Transfers: Sit to/from [facility] Sit to Stand: Min assist;+2 physical assistance;+2  safety/equipment Stand pivot transfers: Min assist;+2 safety/equipment       General transfer comment: assisted with return to bed given lethargy and pt had been up most of afternoon, VCs for safety and steadying assist throughout     Balance Overall balance assessment: Needs assistance Sitting-balance support: Feet supported Sitting balance-Leahy Scale: Fair     Standing balance support: Bilateral upper extremity supported Standing balance-Leahy Scale: Poor Standing balance comment: reliant on UE support                            ADL either performed or assessed with clinical judgement   ADL Overall ADL's : Needs assistance/impaired     Grooming: Supervision/safety;Sitting;Wash/dry face                               Functional mobility during ADLs: Minimal assistance;+2 for safety/equipment;Rolling walker                         Cognition Arousal/Alertness: Lethargic Behavior During Therapy: WFL for tasks assessed/performed Overall Cognitive Status: No family/caregiver present to determine baseline cognitive functioning                                 General Comments: pt very lethargic initially, improved with increased stimuli during session, pt acknowleding having hallucinations - seeing "moving floral wallpaper" on the walls, oriented x4        Exercises General Exercises - Lower Extremity Long Arc Quad: AROM;Both;5 reps;Seated  Shoulder Instructions       General Comments pt with notable weeping/leaking from wound vac on LLE, RN informed and already aware     Pertinent Vitals/ Pain       Pain Assessment: Faces Faces Pain Scale: Hurts little more Pain Location: LLE with movement Pain Descriptors / Indicators: Sore;Aching Pain Intervention(s): Monitored during session;Repositioned  Home Living                                          Prior Functioning/Environment               Frequency  Min 2X/week        Progress Toward Goals  OT Goals(current goals can now be found in the care plan section)  Progress towards OT goals: Progressing toward goals  Acute Rehab OT Goals Patient Stated Goal: To return home OT Goal Formulation: With patient Time For Goal Achievement: 11/14/19 Potential to Achieve Goals: Good ADL Goals Pt Will Perform Lower Body Bathing: with modified independence;with adaptive equipment;sitting/lateral leans;sit to/from stand Pt Will Perform Lower Body Dressing: with modified independence;with adaptive equipment;sitting/lateral leans;sit to/from stand Pt Will Transfer to Toilet: with modified independence;ambulating Pt Will Perform Toileting - Clothing Manipulation and hygiene: with modified independence;sitting/lateral leans;sit to/from stand  Plan Discharge plan remains appropriate    Co-evaluation                 AM-PAC OT "6 Clicks" Daily Activity     Outcome Measure   Help from another person eating meals?: None Help from another person taking care of personal grooming?: A Little Help from another person toileting, which includes using toliet, bedpan, or urinal?: A Little Help from another person bathing (including washing, rinsing, drying)?: A Lot Help from another person to put on and taking off regular upper body clothing?: A Little Help from another person to put on and taking off regular lower body clothing?: A Lot 6 Click Score: 17    End of Session Equipment Utilized During Treatment: Gait belt;Rolling walker;Oxygen  OT Visit Diagnosis: Unsteadiness on feet (R26.81);Pain Pain - Right/Left: Left Pain - part of body: Leg   Activity Tolerance Patient tolerated treatment well   Patient Left in bed;with call bell/phone within reach;with bed alarm set   Nurse Communication Mobility status        Time: 3818-2993 OT Time Calculation (min): 24 min  Charges: OT General Charges $OT Visit: 1 Visit OT  Treatments $Self Care/Home Management : 23-37 mins  Lou Cal, OT Acute Rehabilitation Services Pager [phone] Office [place] 11/05/2019, 5:31 PM
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
## 2019-11-06 DIAGNOSIS — J9601 Acute respiratory failure with hypoxia: Secondary | ICD-10-CM | POA: Diagnosis not present

## 2019-11-06 LAB — BASIC METABOLIC PANEL
Anion gap: 6 (ref 5–15)
BUN: 25 mg/dL — ABNORMAL HIGH (ref 8–23)
CO2: 32 mmol/L (ref 22–32)
Calcium: 8.7 mg/dL — ABNORMAL LOW (ref 8.9–10.3)
Chloride: 98 mmol/L (ref 98–111)
Creatinine, Ser: 0.5 mg/dL (ref 0.44–1.00)
GFR calc Af Amer: >60 mL/min (ref >60)
GFR calc non Af Amer: >60 mL/min (ref >60)
Glucose, Bld: 61 mg/dL — ABNORMAL LOW (ref 70–99)
Potassium: 3.4 mmol/L — ABNORMAL LOW (ref 3.5–5.1)
Sodium: 136 mmol/L (ref 135–145)

## 2019-11-06 LAB — CBC
HCT: 27.3 % — ABNORMAL LOW (ref 36.0–46.0)
Hemoglobin: 8.2 g/dL — ABNORMAL LOW (ref 12.0–15.0)
MCH: 26.5 pg (ref 26.0–34.0)
MCHC: 30 g/dL (ref 30.0–36.0)
MCV: 88.1 fL (ref 80.0–100.0)
Platelets: 178 10*3/uL (ref 150–400)
RBC: 3.1 MIL/uL — ABNORMAL LOW (ref 3.87–5.11)
RDW: 17.2 % — ABNORMAL HIGH (ref 11.5–15.5)
WBC: 9.9 10*3/uL (ref 4.0–10.5)
nRBC: 0.3 % — ABNORMAL HIGH (ref 0.0–0.2)

## 2019-11-06 LAB — GLUCOSE, CAPILLARY
Glucose-Capillary: 72 mg/dL (ref 70–99)
Glucose-Capillary: 107 mg/dL — ABNORMAL HIGH (ref 70–99)
Glucose-Capillary: 363 mg/dL — ABNORMAL HIGH (ref 70–99)
Glucose-Capillary: 180 mg/dL — ABNORMAL HIGH (ref 70–99)

## 2019-11-06 LAB — MAGNESIUM: Magnesium: 1.9 mg/dL (ref 1.7–2.4)

## 2019-11-06 LAB — PHOSPHORUS: Phosphorus: 2.5 mg/dL (ref 2.5–4.6)

## 2019-11-06 MED ORDER — FUROSEMIDE 10 MG/ML IJ SOLN
60.0000 mg | Freq: Four times a day (QID) | INTRAMUSCULAR | Status: AC
  Administered 2019-11-06 (×2): 60 mg via INTRAVENOUS
  Filled 2019-11-06 (×2): qty 6

## 2019-11-06 MED ORDER — POTASSIUM CHLORIDE CRYS ER 20 MEQ PO TBCR
40.0000 meq | EXTENDED_RELEASE_TABLET | Freq: Once | ORAL | Status: AC
  Administered 2019-11-06: 40 meq via ORAL
  Filled 2019-11-06: qty 2

## 2019-11-06 MED ORDER — PREDNISONE 10 MG PO TABS
10.0000 mg | ORAL_TABLET | Freq: Every day | ORAL | Status: AC
  Administered 2019-11-11 – 2019-11-12 (×2): 10 mg via ORAL
  Filled 2019-11-06 (×2): qty 1

## 2019-11-06 MED ORDER — PREDNISONE 20 MG PO TABS
30.0000 mg | ORAL_TABLET | Freq: Every day | ORAL | Status: AC
  Administered 2019-11-07 – 2019-11-08 (×2): 30 mg via ORAL
  Filled 2019-11-06 (×2): qty 1

## 2019-11-06 MED ORDER — ALBUTEROL SULFATE (2.5 MG/3ML) 0.083% IN NEBU
2.5000 mg | INHALATION_SOLUTION | Freq: Four times a day (QID) | RESPIRATORY_TRACT | Status: DC
  Administered 2019-11-06 – 2019-11-07 (×3): 2.5 mg via RESPIRATORY_TRACT
  Filled 2019-11-06 (×3): qty 3

## 2019-11-06 MED ORDER — MAGNESIUM SULFATE 2 GM/50ML IV SOLN
2.0000 g | Freq: Once | INTRAVENOUS | Status: AC
  Administered 2019-11-06: 2 g via INTRAVENOUS
  Filled 2019-11-06: qty 50

## 2019-11-06 MED ORDER — PREGABALIN 75 MG PO CAPS
150.0000 mg | ORAL_CAPSULE | Freq: Two times a day (BID) | ORAL | Status: DC
  Administered 2019-11-06 – 2019-11-11 (×11): 150 mg via ORAL
  Filled 2019-11-06 (×11): qty 2

## 2019-11-06 MED ORDER — PREDNISONE 20 MG PO TABS
20.0000 mg | ORAL_TABLET | Freq: Every day | ORAL | Status: AC
  Administered 2019-11-09 – 2019-11-10 (×2): 20 mg via ORAL
  Filled 2019-11-06 (×2): qty 1

## 2019-11-06 NOTE — Progress Notes (Signed)
Physical Therapy Treatment Patient Details Name: [name] [name] MRN: [id number] DOB: [birth year] Today's Date: 11/06/2019    History of Present Illness 64 y.o. female presenting with Lt leg ulcer, occlusion of R femoral-popliteal bypass graft s/p left femoral-popliteal BPG with groin wound VAC and Rt leg balloon angioplasty SFA . 8/14 pt with AFib with new RBBB, 8/15 progressive hypoxemia. PMHx significant for R femoral-popliteal bypass graf on 04/07/2017, L carotid endartectomy on 06/30/2017, COPD, depression, DM, HLD, HTN, obesity, and tobacco use.    PT Comments    Pt very eager to walk, pleasant and singing throughout session. Pt with medial thigh VAC without suction and leaking throughout session with RN aware and ABD with kerlix applied over VAC during mobility to contain drainage. Pt able to walk to bathroom for BM with assist for pericare and perform seated HEP. Will continue to follow with RN aware of mobility performed.   SpO2 >93% on 8L throughout session with transition to 7L at 99% at rest end of session Hr 83-87 129/77 (89)    Follow Up Recommendations  Home health PT;Supervision for mobility/OOB     Equipment Recommendations  Rolling walker with 5" wheels    Recommendations for Other Services       Precautions / Restrictions Precautions Precautions: Fall Precaution Comments: wound vac x 3, watch sats Restrictions Weight Bearing Restrictions: No    Mobility  Bed Mobility Overal bed mobility: Needs Assistance Bed Mobility: Supine to Sit     Supine to sit: Min guard     General bed mobility comments: guarding for lines and safety with use of rail and increased time with HOB 25 degrees  Transfers Overall transfer level: Needs assistance   Transfers: Sit to/from Stand Sit to Stand: Min assist         General transfer comment: min assist to rise from bed and from low toilet with rail. cues for hand placement  Ambulation/Gait Ambulation/Gait assistance:  Min guard Gait Distance (Feet): 18 Feet Assistive device: Rolling walker (2 wheeled) Gait Pattern/deviations: Step-through pattern;Decreased stride length;Trunk flexed     General Gait Details: cues for posture and proximity with assist to maintain lines and VACs. SpO2 95% on 8L with activity   Stairs             Wheelchair Mobility    Modified Rankin (Stroke Patients Only)       Balance Overall balance assessment: Needs assistance   Sitting balance-Leahy Scale: Good     Standing balance support: Bilateral upper extremity supported Standing balance-Leahy Scale: Poor Standing balance comment: reliant on UE support                             Cognition Arousal/Alertness: Awake/alert Behavior During Therapy: WFL for tasks assessed/performed Overall Cognitive Status: Within Functional Limits for tasks assessed                                        Exercises General Exercises - Lower Extremity Long Arc Quad: AROM;Both;Seated;20 reps Hip Flexion/Marching: AROM;Both;Seated;10 reps    General Comments        Pertinent Vitals/Pain Pain Score: 2  Pain Location: LLE with movement Pain Descriptors / Indicators: Aching Pain Intervention(s): Limited activity within patient's tolerance;Repositioned;Monitored during session    Home Living  Prior Function            PT Goals (current goals can now be found in the care plan section) Acute Rehab PT Goals Patient Stated Goal: To return home Time For Goal Achievement: 11/20/19 Potential to Achieve Goals: Good Progress towards PT goals: Progressing toward goals    Frequency    Min 3X/week      PT Plan Current plan remains appropriate    Co-evaluation              AM-PAC PT "6 Clicks" Mobility   Outcome Measure  Help needed turning from your back to your side while in a flat bed without using bedrails?: A Little Help needed moving from lying  on your back to sitting on the side of a flat bed without using bedrails?: A Little Help needed moving to and from a bed to a chair (including a wheelchair)?: A Little Help needed standing up from a chair using your arms (e.g., wheelchair or bedside chair)?: A Little Help needed to walk in hospital room?: A Little Help needed climbing 3-5 steps with a railing? : A Lot 6 Click Score: 17    End of Session Equipment Utilized During Treatment: Gait belt;Oxygen Activity Tolerance: Patient tolerated treatment well Patient left: in chair;with call bell/phone within reach;with chair alarm set Nurse Communication: Mobility status PT Visit Diagnosis: Unsteadiness on feet (R26.81);Muscle weakness (generalized) (M62.81);Other abnormalities of gait and mobility (R26.89)     Time: 5366-4403 PT Time Calculation (min) (ACUTE ONLY): 37 min  Charges:  $Gait Training: 8-22 mins $Therapeutic Exercise: 8-22 mins                     Alahni Varone P, PT Acute Rehabilitation Services Pager: [phone] Office: [place] 11/06/2019, 10:47 AM

## 2019-11-06 NOTE — Progress Notes (Signed)
Wound vac on pt's upper thigh removed per Dr. Trula Slade. Wound vac not sealing with pt being mobile and supplies are not accessible at this time. Gauze dressing applied and will monitor drainage. Will continue to monitor. Wound vac in left groin site and left lower leg in place.

## 2019-11-06 NOTE — Progress Notes (Signed)
K 3.4, Mg 1.9 Electrolytes replaced per protocol

## 2019-11-06 NOTE — Progress Notes (Addendum)
NAME:  [name] [name], MRN:  [id number], DOB:  24-May-1955, LOS: 7 ADMISSION DATE:  10/30/2019, CONSULTATION DATE:  11/03/2018 REFERRING MD:  Eudelia Bunch, CHIEF COMPLAINT:  SOB with progressive hypoxemia   Brief History   This is a 64 y.o. W F admitted for surgical management of a left leg ulcer and occlusion of a right femoral-popliteal bypass graft. The patient underwent surgical intervention on 8/11 with: 1) Left femoral to below-knee popliteal artery bypass with ipsilateral nonreversed saphenous vein, 2) Left external iliac, common femoral, profundofemoral endarterectomy, 3) Right lower extremity angiogram, 4) Balloon angioplasty, right femoral-popliteal bypass and, 5) Left groin Prevena wound VAC.  On 8/14, the patient had an episode of A-Fib with RVR with an elevated troponin (5084) that converted to NSR with Cardizem. ECG 8/1-8/14 shows a new RBBB c/w ECG 04/04/2017. The patient, although obese, has not been on consistent DVT prophylaxis and currently not on SCDs or anticoagulants. CXR (0805) showed patchy airspace opacity is noted throughout both lungs as well as some interstitial edema. However, later CXR at 1613 progressed with "moderate severity infiltrate is noted within the mid right lung and lateral aspect of the right lung base, which is increased in severity when compared to the prior exam." Cefipime and vanc was initiated for possible HAP with WBC albeit elevated but showing no consistent trend from 8/11 thru 8/15.  Early AM was seen by RRT for SOB and given a Duoneb. Patient remains borderline hypoxemic on nasal cannula. She does acknowledge at least a 40 pk-yr tobacco hx and continues to smoke 1ppd. She is currently managed OPD with Flovent + albuterol. Although she was net 5780 mL since admission, she was net negative during the period that her CXR worsened. PCCM is being consulted for progressive hypoxemia and SOB  Past Medical History   Past Medical History:  Diagnosis Date    Abdominal abscess    Allergic rhinitis    Asthma    Bacteremia 03/2017   Carotid stenosis, left 04/2009   50-69%   Colon polyps 12/05   COPD (chronic obstructive pulmonary disease) (HCC)    Depression    Diabetes mellitus    followed by Dr. Chalmers Cater   Folate deficiency    Hyperlipidemia    Hypertension    Lumbar disc disease    h/o hnp with repair   Obesity, morbid (more than 100 lbs over ideal weight or BMI > 40) (HCC)    Peripheral vascular disease ([place])    Psoriasis    Thrombocytopenia ([place])    work up with Dr. Griffith Citron neg in 2000   Tobacco abuse     Consults:  8/15 PCCM  Procedures:  As noted above  Micro Data:  8/15: Sputum culture  Antimicrobials:  8/14 Cefipime 8/14 Vanc   Interim history/subjective:  Feels better  Objective   Blood pressure 129/77, pulse 94, temperature 98.1 F (36.7 C), temperature source Oral, resp. rate (Abnormal) 21, height 5\' 7"  (1.702 m), weight 127.3 kg, SpO2 95 %.        Intake/Output Summary (Last 24 hours) at 11/06/2019 1041 Last data filed at 11/06/2019 6811 Gross per 24 hour  Intake 189.03 ml  Output 2840 ml  Net -2650.97 ml   Filed Weights   11/03/19 0600 11/04/19 0400 11/05/19 0600  Weight: 129.3 kg 127.4 kg 127.3 kg    Examination: General this is a pleasant 64 year old white female she is sitting up in bed currently in no acute distress HEENT normocephalic atraumatic.  Does  have a rather hoarse phonation quality.  Endorses ongoing nasal congestion at times when she blows her nose has some bloody clot attributing this to high flow nasal cannula device, has prominent upper airway wheezing Pulmonary: Diffuse wheezing and rhonchi, basilar rales no accessory use currently 7 L high flow oxygen nasal cannula Cardiac: Regular rate and rhythm Abdomen soft nontender Extremities are warm dry, wound VAC in place in the left lower extremity Neuro awake oriented no focal deficits Assessment &  Plan:  Acute hypoxemic respiratory failure- differential is some combination volume overload, AECOPD -abx stopped 8/17; since then wbc stable (decreasing) and no fever curve -oxygen requirements slowly improving; now 13.4 liters -pcxr on 8/17 w/ diffuse airspace disease c/w edema  Plan Cont to wean FIO2 Change BiPAP to as needed Cont IV lasix and push as long as BP/BUN/cr allow  Taper pred to off over next few days Cont Yupelri/brovana and pulmicort neb Cont Flutter and IS Mobilize Am cxr    Afib/RVR, trop leak, ?new BBB Plan Cont BB Cont Eliquis  Cont asa   PAD, post op status Plan Per vascular   Muscular deconditioning- Plan PT/OT  Anxiety, depression, paranoia, question developing ICU delirium, this seems to have improved Plan Back on home Lyrica today, stop Seroquel  Cont Zoloft and Gabapentin   I think she can go to stepdown unit when okay with vascular to send We will see 1 more day from a pulmonary standpoint to follow-up chest x-ray and make any further final recommendations  Erick Colace ACNP-BC [place] Pager # [phone] OR # [phone] if no answer

## 2019-11-06 NOTE — Progress Notes (Addendum)
  Progress Note    11/06/2019 7:35 AM 7 Days Post-Op  Subjective:  Patient is feeling better.  She believes she is ready for discharge home.   Vitals:   11/06/19 0635 11/06/19 0700  BP:  (!) 142/55  Pulse:  84  Resp:  16  Temp: 98.1 F (36.7 C)   SpO2:  100%   Physical Exam: Lungs:  Non labored breathing on O2 by [place] Incisions:  L groin and popliteal incisional vacs in place with good seal; thigh vac not sealing well this morning however has a full cannister of serosanguinous fluid Extremities:  Feet are warm and well perfused; ulcerations L foot stable and dry; L 2nd toe dry gangrene Neurologic: A&O  CBC    Component Value Date/Time   WBC 9.9 11/06/2019 0303   RBC 3.10 (L) 11/06/2019 0303   HGB 8.2 (L) 11/06/2019 0303   HCT 27.3 (L) 11/06/2019 0303   PLT 178 11/06/2019 0303   MCV 88.1 11/06/2019 0303   MCH 26.5 11/06/2019 0303   MCHC 30.0 11/06/2019 0303   RDW 17.2 (H) 11/06/2019 0303   LYMPHSABS 1.7 11/02/2019 2239   MONOABS 1.5 (H) 11/02/2019 2239   EOSABS 0.1 11/02/2019 2239   BASOSABS 0.0 11/02/2019 2239    BMET    Component Value Date/Time   NA 136 11/06/2019 0303   K 3.4 (L) 11/06/2019 0303   CL 98 11/06/2019 0303   CO2 32 11/06/2019 0303   GLUCOSE 61 (L) 11/06/2019 0303   BUN 25 (H) 11/06/2019 0303   CREATININE 0.50 11/06/2019 0303   CREATININE 0.69 01/28/2013 1110   CALCIUM 8.7 (L) 11/06/2019 0303   GFRNONAA >60 11/06/2019 0303   GFRAA >60 11/06/2019 0303    INR    Component Value Date/Time   INR 1.1 10/30/2019 0651     Intake/Output Summary (Last 24 hours) at 11/06/2019 0735 Last data filed at 11/06/2019 0600 Gross per 24 hour  Intake 139.03 ml  Output 4540 ml  Net -4400.97 ml     Assessment/Plan:  64 y.o. female is s/p Left fem-BK pop bypass with vein and angioplasty of right fem-pop bypass with post op respiratory failure 7 Days Post-Op   BLE warm and well perfused L groin and popliteal vacs with good seal; tubing switched at  thigh incision to create a good seal; will likely need to remove vac and do wet to dry if this continues to have problems Defer respiratory status and management to CCM [facility] team consulted to arrange [facility] PT per PT recommendations    [name], PA-C [facility] [phone] 11/06/2019 7:35 AM I agree with the above.  Ihave seen and examined the patient.  Anticipate transfer to [place] if ok by CCM.  Will remove medial thigh vac as it has lost its seal.  She needs to ambulate.  WElls Crystalle Popwell
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
## 2019-11-07 ENCOUNTER — Inpatient Hospital Stay (HOSPITAL_COMMUNITY): Payer: Medicare Other

## 2019-11-07 LAB — GLUCOSE, CAPILLARY
Glucose-Capillary: 100 mg/dL — ABNORMAL HIGH (ref 70–99)
Glucose-Capillary: 218 mg/dL — ABNORMAL HIGH (ref 70–99)
Glucose-Capillary: 260 mg/dL — ABNORMAL HIGH (ref 70–99)
Glucose-Capillary: 171 mg/dL — ABNORMAL HIGH (ref 70–99)

## 2019-11-07 LAB — BASIC METABOLIC PANEL
Anion gap: 7 (ref 5–15)
BUN: 24 mg/dL — ABNORMAL HIGH (ref 8–23)
CO2: 35 mmol/L — ABNORMAL HIGH (ref 22–32)
Calcium: 8.8 mg/dL — ABNORMAL LOW (ref 8.9–10.3)
Chloride: 96 mmol/L — ABNORMAL LOW (ref 98–111)
Creatinine, Ser: 0.57 mg/dL (ref 0.44–1.00)
GFR calc Af Amer: >60 mL/min (ref >60)
GFR calc non Af Amer: >60 mL/min (ref >60)
Glucose, Bld: 95 mg/dL (ref 70–99)
Potassium: 4.2 mmol/L (ref 3.5–5.1)
Sodium: 138 mmol/L (ref 135–145)

## 2019-11-07 LAB — PHOSPHORUS: Phosphorus: 2.5 mg/dL (ref 2.5–4.6)

## 2019-11-07 LAB — CBC
HCT: 28 % — ABNORMAL LOW (ref 36.0–46.0)
Hemoglobin: 8.3 g/dL — ABNORMAL LOW (ref 12.0–15.0)
MCH: 26.1 pg (ref 26.0–34.0)
MCHC: 29.6 g/dL — ABNORMAL LOW (ref 30.0–36.0)
MCV: 88.1 fL (ref 80.0–100.0)
Platelets: 199 10*3/uL (ref 150–400)
RBC: 3.18 MIL/uL — ABNORMAL LOW (ref 3.87–5.11)
RDW: 17.2 % — ABNORMAL HIGH (ref 11.5–15.5)
WBC: 11.1 10*3/uL — ABNORMAL HIGH (ref 4.0–10.5)
nRBC: 0 % (ref 0.0–0.2)

## 2019-11-07 LAB — MAGNESIUM: Magnesium: 2.1 mg/dL (ref 1.7–2.4)

## 2019-11-07 MED ORDER — FUROSEMIDE 10 MG/ML IJ SOLN
60.0000 mg | Freq: Four times a day (QID) | INTRAMUSCULAR | Status: AC
  Administered 2019-11-07 (×2): 60 mg via INTRAVENOUS
  Filled 2019-11-07 (×2): qty 6

## 2019-11-07 MED ORDER — ALBUTEROL SULFATE (2.5 MG/3ML) 0.083% IN NEBU
2.5000 mg | INHALATION_SOLUTION | Freq: Two times a day (BID) | RESPIRATORY_TRACT | Status: DC
  Administered 2019-11-07 – 2019-11-09 (×4): 2.5 mg via RESPIRATORY_TRACT
  Filled 2019-11-07 (×4): qty 3

## 2019-11-07 MED ORDER — POTASSIUM CHLORIDE CRYS ER 20 MEQ PO TBCR
40.0000 meq | EXTENDED_RELEASE_TABLET | Freq: Once | ORAL | Status: AC
  Administered 2019-11-07: 40 meq via ORAL
  Filled 2019-11-07: qty 2

## 2019-11-07 NOTE — Significant Event (Addendum)
Patient taken safely to 4E06, via wheelchair on 4L HFNC, with another RN [employer]. Patient has all her belongings taken to her new room. Per patient, she will call her daughter to let her know of the room number. Patient settled in chair per her requests. Call bell within reach; receiving staff aware patient is settled in room.     Kathaleen Grinder, RN

## 2019-11-07 NOTE — Progress Notes (Addendum)
Occupational Therapy Treatment Patient Details Name: [name] [name] MRN: [id number] DOB: 1955-05-18 Today's Date: 11/07/2019    History of present illness 64 y.o. female presenting with Lt leg ulcer, occlusion of R femoral-popliteal bypass graft s/p left femoral-popliteal BPG with groin wound VAC and Rt leg balloon angioplasty SFA . 8/14 pt with AFib with new RBBB, 8/15 progressive hypoxemia. PMHx significant for R femoral-popliteal bypass graf on 04/07/2017, L carotid endartectomy on 06/30/2017, COPD, depression, DM, HLD, HTN, obesity, and tobacco use.   OT comments  Making steady progress. Able to ambulate @ 60 feet and complete standing grooming task at sink on 2L with VSS. SpO2 @ 89 with poor pleth and 2/4 DOE. Educated pt on DME recommendations in addition to need for assistance after DC. Pt verbalized understanding and states her daughter will be able to assist at DC. Pt very motivated to become stronger and more independent. Will continue to follow acutely.   Follow Up Recommendations  Home health OT;Supervision/Assistance - 24 hour    Equipment Recommendations  3 in 1 bedside commode;Tub/shower bench    Recommendations for Other Services      Precautions / Restrictions Precautions Precautions: Fall Precaution Comments: wound vac , watch sats       Mobility Bed Mobility               General bed mobility comments: OOB in chair  Transfers Overall transfer level: Needs assistance Equipment used: Rolling walker (2 wheeled) Transfers: Sit to/from Stand Sit to Stand: Min guard              Balance     Sitting balance-Leahy Scale: Good       Standing balance-Leahy Scale: Poor                             ADL either performed or assessed with clinical judgement   ADL Overall ADL's : Needs assistance/impaired     Grooming: Standing;Min guard   Upper Body Bathing: Set up;Sitting   Lower Body Bathing: Minimal assistance;Sit to/from stand            Toilet Transfer: Min guard;RW;Ambulation   Toileting- Water quality scientist and Hygiene: Minimal assistance;Sit to/from stand       Functional mobility during ADLs: Min guard;Rolling walker;Cueing for safety General ADL Comments: Stood at sink to complete Grooming and UB ADL with SpO2 in 90s. able to donn L sock with minimal difficulty. discussed DME needs. Will benefit from tub bench - showed pt picture/explained use and pt agrees. Would benefit from use of reacher.      Vision       Perception     Praxis      Cognition Arousal/Alertness: Awake/alert Behavior During Therapy: WFL for tasks assessed/performed Overall Cognitive Status: Within Functional Limits for tasks assessed                                          Exercises Exercises: General Upper Extremity General Exercises - Upper Extremity Shoulder Flexion: Strengthening;Both;20 reps Shoulder ABduction: Strengthening;Both;20 reps;Seated General Exercises - Lower Extremity Hip Flexion/Marching: 20 reps;Seated;Strengthening;Both Other Exercises Other Exercises: pursed lip breathing   Shoulder Instructions       General Comments      Pertinent Vitals/ Pain       Pain Assessment: 0-10 Pain Score: 8  Pain Location: LLE  with movement Pain Descriptors / Indicators: Aching;Burning Pain Intervention(s): Limited activity within patient's tolerance  Home Living                                          Prior Functioning/Environment              Frequency  Min 2X/week        Progress Toward Goals  OT Goals(current goals can now be found in the care plan section)  Progress towards OT goals: Progressing toward goals  Acute Rehab OT Goals Patient Stated Goal: to go home and be independent  OT Goal Formulation: With patient Time For Goal Achievement: 11/14/19 Potential to Achieve Goals: Good ADL Goals Pt Will Perform Lower Body Bathing: with modified  independence;with adaptive equipment;sitting/lateral leans;sit to/from stand Pt Will Perform Lower Body Dressing: with modified independence;with adaptive equipment;sitting/lateral leans;sit to/from stand Pt Will Transfer to Toilet: with modified independence;ambulating Pt Will Perform Toileting - Clothing Manipulation and hygiene: with modified independence;sitting/lateral leans;sit to/from stand  Plan Discharge plan remains appropriate    Co-evaluation                 AM-PAC OT "6 Clicks" Daily Activity     Outcome Measure   Help from another person eating meals?: None Help from another person taking care of personal grooming?: A Little Help from another person toileting, which includes using toliet, bedpan, or urinal?: A Little Help from another person bathing (including washing, rinsing, drying)?: A Little Help from another person to put on and taking off regular upper body clothing?: A Little Help from another person to put on and taking off regular lower body clothing?: A Lot 6 Click Score: 18    End of Session Equipment Utilized During Treatment: Gait belt;Rolling walker;Oxygen (2L)  OT Visit Diagnosis: Unsteadiness on feet (R26.81);Pain Pain - Right/Left: Left Pain - part of body: Leg   Activity Tolerance Patient tolerated treatment well   Patient Left in chair;with call bell/phone within reach;with chair alarm set   Nurse Communication Mobility status        Time: 1694-5038 OT Time Calculation (min): 33 min  Charges: OT General Charges $OT Visit: 1 Visit OT Treatments $Self Care/Home Management : 23-37 mins  Maurie Boettcher, OT/L   Acute OT Clinical Specialist [place] Pager [phone] Office [phone]    [facility] 11/07/2019, 12:16 PM

## 2019-11-07 NOTE — Plan of Care (Signed)

## 2019-11-07 NOTE — Progress Notes (Addendum)
Patient arrived from 2 heart to [place]. Vital signs obtained. And CHG bath completed. Patient with 2 wound vacs one to thigh and one praveena, praveena wound vac, beeping seal appears intact but machine states leak, machine is  plugged in.  Dressing reinforced with more drape. Thigh wound vac seal intact. CCMD made aware patient on monitor. Will monitor patient. Cathye Kreiter, Bettina Gavia RN

## 2019-11-07 NOTE — Discharge Instructions (Addendum)
Information on my medicine - ELIQUIS (apixaban)  This medication education was reviewed with me or my healthcare representative as part of my discharge preparation.  Why was Eliquis prescribed for you? Eliquis was prescribed for you to reduce the risk of a blood clot forming that can cause a stroke if you have a medical condition called atrial fibrillation (a type of irregular heartbeat).  What do You need to know about Eliquis ? Take your Eliquis TWICE DAILY - one tablet in the morning and one tablet in the evening with or without food. If you have difficulty swallowing the tablet whole please discuss with your pharmacist how to take the medication safely.  Take Eliquis exactly as prescribed by your doctor and DO NOT stop taking Eliquis without talking to the doctor who prescribed the medication.  Stopping may increase your risk of developing a stroke.  Refill your prescription before you run out.  After discharge, you should have regular check-up appointments with your healthcare provider that is prescribing your Eliquis.  In the future your dose may need to be changed if your kidney function or weight changes by a significant amount or as you get older.  What do you do if you miss a dose? If you miss a dose, take it as soon as you remember on the same day and resume taking twice daily.  Do not take more than one dose of ELIQUIS at the same time to make up a missed dose.  Important Safety Information A possible side effect of Eliquis is bleeding. You should call your healthcare provider right away if you experience any of the following: ? Bleeding from an injury or your nose that does not stop. ? Unusual colored urine (red or dark brown) or unusual colored stools (red or black). ? Unusual bruising for unknown reasons. ? A serious fall or if you hit your head (even if there is no bleeding).  Some medicines may interact with Eliquis and might increase your risk of bleeding or  clotting while on Eliquis. To help avoid this, consult your healthcare provider or pharmacist prior to using any new prescription or non-prescription medications, including herbals, vitamins, non-steroidal anti-inflammatory drugs (NSAIDs) and supplements.  This website has more information on Eliquis (apixaban): http://www.eliquis.com/eliquis/home     [facility]  Discharge instructions  Lower Extremity Bypass Surgery  Please refer to the following instruction for your post-procedure care. Your surgeon or physician assistant will discuss any changes with you.  Activity  You are encouraged to walk as much as you can. You can slowly return to normal activities during the month after your surgery. Avoid strenuous activity and heavy lifting until your doctor tells you it's OK. Avoid activities such as vacuuming or swinging a golf club. Do not drive until your doctor give the OK and you are no longer taking prescription pain medications. It is also normal to have difficulty with sleep habits, eating and bowel movement after surgery. These will go away with time.  Bathing/Showering  Shower daily after you go home. Do not soak in a bathtub, hot tub, or swim until the incision heals completely.  Incision Care  Clean your incision with mild soap and water. Shower every day. Pat the area dry with a clean towel. You do not need a bandage unless otherwise instructed. Do not apply any ointments or creams to your incision. If you have open wounds you will be instructed how to care for them or a visiting nurse  may be arranged for you. If you have staples or sutures along your incision they will be removed at your post-op appointment. You may have skin glue on your incision. Do not peel it off. It will come off on its own in about one week.  Wash the groin wound with soap and water daily and pat dry. (No tub bath-only shower)  Then put a dry gauze or washcloth in the  groin to keep this area dry to help prevent wound infection.  Do this daily and as needed.  Do not use Vaseline or neosporin on your incisions.  Only use soap and water on your incisions and then protect and keep dry.  Diet  Resume your normal diet. There are no special food restrictions following this procedure. A low fat/ low cholesterol diet is recommended for all patients with vascular disease. In order to heal from your surgery, it is CRITICAL to get adequate nutrition. Your body requires vitamins, minerals, and protein. Vegetables are the best source of vitamins and minerals. Vegetables also provide the perfect balance of protein. Processed food has little nutritional value, so try to avoid this.  Medications  Resume taking all your medications unless your doctor or physician assistant tells you not to. If your incision is causing pain, you may take over-the-counter pain relievers such as acetaminophen (Tylenol). If you were prescribed a stronger pain medication, please aware these medication can cause nausea and constipation. Prevent nausea by taking the medication with a snack or meal. Avoid constipation by drinking plenty of fluids and eating foods with high amount of fiber, such as fruits, vegetables, and grains. Take Colace 100 mg (an over-the-counter stool softener) twice a day as needed for constipation.   Do not take Tylenol if you are taking prescription pain medications.  Follow Up  Our office will schedule a follow up appointment 2-3 weeks following discharge.  Please call us immediately for any of the following conditions  .Severe or worsening pain in your legs or feet while at rest or while walking .Increase pain, redness, warmth, or drainage (pus) from your incision site(s) . Fever of 101 degree or higher . The swelling in your leg with the bypass suddenly worsens and becomes more painful than when you were in the hospital . If you have been instructed to feel your graft  pulse then you should do so every day. If you can no longer feel this pulse, call the office immediately. Not all patients are given this instruction. .  Leg swelling is common after leg bypass surgery.  The swelling should improve over a few months following surgery. To improve the swelling, you may elevate your legs above the level of your heart while you are sitting or resting. Your surgeon or physician assistant may ask you to apply an ACE wrap or wear compression (TED) stockings to help to reduce swelling.  Reduce your risk of vascular disease  Stop smoking. If you would like help call [facility] at 1-800-QUIT-NOW 854 259 3025) or [place] at (667)087-8291.  . Manage your cholesterol . Maintain a desired weight . Control your diabetes weight . Control your diabetes . Keep your blood pressure down .  If you have any questions, please call the office at [phone]

## 2019-11-07 NOTE — Progress Notes (Addendum)
Progress Note    11/07/2019 7:45 AM 8 Days Post-Op  Subjective:  She says she is feeling better and is getting OOB to urinate as well as sitting in chair.  Left lower leg incisional pain.   Vitals:   11/07/19 0600 11/07/19 0700  BP: (!) 110/45 (!) 119/55  Pulse: 73 82  Resp: 14 13  Temp:    SpO2: 96% 100%   O2 supplement: 7 liters high flow [place] Physical Exam: Cardiac:  RRR Lungs:  Bilateral rhonchi Incisions:  Prevena dressings on left groin and left lower leg incisions. Groin with good seal. Leg dressing not holding seal. Left thigh dressing in place with minimal serosang drainage. No erythema or bleeding. Extremities:  LLE skin ulcerations and toe ulcer are dry. Palpable right DP pulse. Both feet are warm with intact sensation and motor function Abdomen:  Soft, ND  CBC    Component Value Date/Time   WBC 11.1 (H) 11/07/2019 0410   RBC 3.18 (L) 11/07/2019 0410   HGB 8.3 (L) 11/07/2019 0410   HCT 28.0 (L) 11/07/2019 0410   PLT 199 11/07/2019 0410   MCV 88.1 11/07/2019 0410   MCH 26.1 11/07/2019 0410   MCHC 29.6 (L) 11/07/2019 0410   RDW 17.2 (H) 11/07/2019 0410   LYMPHSABS 1.7 11/02/2019 2239   MONOABS 1.5 (H) 11/02/2019 2239   EOSABS 0.1 11/02/2019 2239   BASOSABS 0.0 11/02/2019 2239    BMET    Component Value Date/Time   NA 138 11/07/2019 0410   K 4.2 11/07/2019 0410   CL 96 (L) 11/07/2019 0410   CO2 35 (H) 11/07/2019 0410   GLUCOSE 95 11/07/2019 0410   BUN 24 (H) 11/07/2019 0410   CREATININE 0.57 11/07/2019 0410   CREATININE 0.69 01/28/2013 1110   CALCIUM 8.8 (L) 11/07/2019 0410   GFRNONAA >60 11/07/2019 0410   GFRAA >60 11/07/2019 0410     Intake/Output Summary (Last 24 hours) at 11/07/2019 0745 Last data filed at 11/07/2019 0400 Gross per 24 hour  Intake 1010 ml  Output 3075 ml  Net -2065 ml    HOSPITAL MEDICATIONS Scheduled Meds: . albuterol  2.5 mg Nebulization Q6H  . apixaban  5 mg Oral BID  . arformoterol  15 mcg Nebulization BID  .  aspirin EC  81 mg Oral Daily  . atorvastatin  40 mg Oral Daily  . budesonide (PULMICORT) nebulizer solution  0.5 mg Nebulization BID  . Chlorhexidine Gluconate Cloth  6 each Topical Daily  . clopidogrel  75 mg Oral Daily  . docusate sodium  100 mg Oral Daily  . gabapentin  300 mg Oral TID  . insulin aspart  0-20 Units Subcutaneous TID WC  . insulin aspart  0-5 Units Subcutaneous QHS  . insulin aspart protamine- aspart  40 Units Subcutaneous QHS  . metoprolol tartrate  50 mg Oral BID  . nicotine  21 mg Transdermal Daily  . pantoprazole  40 mg Oral Daily  . predniSONE  30 mg Oral Q breakfast   Followed by  . [START ON 11/09/2019] predniSONE  20 mg Oral Q breakfast   Followed by  . [START ON 11/11/2019] predniSONE  10 mg Oral Q breakfast  . pregabalin  150 mg Oral BID  . revefenacin  175 mcg Nebulization Daily  . sertraline  100 mg Oral Daily   Continuous Infusions: . sodium chloride Stopped (11/05/19 1115)  . magnesium sulfate bolus IVPB     PRN Meds:.sodium chloride, acetaminophen **OR** acetaminophen, albuterol, ALPRAZolam, alum &  mag hydroxide-simeth, bisacodyl, guaiFENesin-dextromethorphan, labetalol, magnesium sulfate bolus IVPB, metoprolol tartrate, ondansetron, oxyCODONE-acetaminophen, phenol, potassium chloride, senna-docusate, sodium chloride flush  Assessment: 64 y.o. female is s/p Left fem-BK pop bypass with vein and angioplasty of right fem-pop bypass with post op respiratory failure 8 Days Post-Op. Lower extremity bypasses patent.      Plan:  Continue pulmonary treatment plan and efforts at mobilization. Continue Plavix and aspirin. Monitor incisions. Disposition plan is home with [facility] PT Hx a. Fib/RVR on Eliquis    Risa Grill, PA-C [facility] [phone] 11/07/2019  7:45 AM   Will ask wound care to place incisional vacs on left leg incisions Probably transfer to floor pending CCM eval today   [place]s Tisheena Maguire

## 2019-11-07 NOTE — Progress Notes (Signed)
NAME:  [name] [name], MRN:  [id number], DOB:  10-May-1955, LOS: 8 ADMISSION DATE:  10/30/2019, CONSULTATION DATE:  11/03/2018 REFERRING MD:  Eudelia Bunch, CHIEF COMPLAINT:  SOB with progressive hypoxemia   Brief History   This is a 64 y.o. W F admitted for surgical management of a left leg ulcer and occlusion of a right femoral-popliteal bypass graft. The patient underwent surgical intervention on 8/11 with: 1) Left femoral to below-knee popliteal artery bypass with ipsilateral nonreversed saphenous vein, 2) Left external iliac, common femoral, profundofemoral endarterectomy, 3) Right lower extremity angiogram, 4) Balloon angioplasty, right femoral-popliteal bypass and, 5) Left groin Prevena wound VAC.  On 8/14, the patient had an episode of A-Fib with RVR with an elevated troponin (5084) that converted to NSR with Cardizem. ECG 8/1-8/14 shows a new RBBB c/w ECG 04/04/2017. The patient, although obese, has not been on consistent DVT prophylaxis and currently not on SCDs or anticoagulants. CXR (0805) showed patchy airspace opacity is noted throughout both lungs as well as some interstitial edema. However, later CXR at 1613 progressed with "moderate severity infiltrate is noted within the mid right lung and lateral aspect of the right lung base, which is increased in severity when compared to the prior exam." Cefipime and vanc was initiated for possible HAP with WBC albeit elevated but showing no consistent trend from 8/11 thru 8/15.  Early AM was seen by RRT for SOB and given a Duoneb. Patient remains borderline hypoxemic on nasal cannula. She does acknowledge at least a 40 pk-yr tobacco hx and continues to smoke 1ppd. She is currently managed OPD with Flovent + albuterol. Although she was net 5780 mL since admission, she was net negative during the period that her CXR worsened. PCCM is being consulted for progressive hypoxemia and SOB  Past Medical History   Past Medical History:  Diagnosis Date  .  Abdominal abscess   . Allergic rhinitis   . Asthma   . Bacteremia 03/2017  . Carotid stenosis, left 04/2009   50-69%  . Colon polyps 12/05  . COPD (chronic obstructive pulmonary disease) ([place])   . Depression   . Diabetes mellitus    followed by Dr. Chalmers Cater  . Folate deficiency   . Hyperlipidemia   . Hypertension   . Lumbar disc disease    h/o hnp with repair  . Obesity, morbid (more than 100 lbs over ideal weight or BMI > 40) ([facility])   . Peripheral vascular disease ([place])   . Psoriasis   . Thrombocytopenia ([place])    work up with Dr. Griffith Citron neg in 2000  . Tobacco abuse     Consults:  8/15 PCCM  Procedures:  As noted above Echocardiogram 8/16 abnormal septal motion.  LVEF 29% grade 2 diastolic dysfunction elevated end-diastolic pressure normal RV function Micro Data:  8/15: Sputum culture  Antimicrobials:  8/14 Cefipime 8/14 Vanc   Interim history/subjective:  Continues to feel better Objective   Blood pressure (Abnormal) 106/50, pulse 69, temperature 97.7 F (36.5 C), temperature source Oral, resp. rate 19, height 5\' 7"  (1.702 m), weight 121.8 kg, SpO2 99 %.        Intake/Output Summary (Last 24 hours) at 11/07/2019 1123 Last data filed at 11/07/2019 5284 Gross per 24 hour  Intake 960 ml  Output 3075 ml  Net -2115 ml   Filed Weights   11/04/19 0400 11/05/19 0600 11/07/19 0400  Weight: 127.4 kg 127.3 kg 121.8 kg    Examination: General: This is a 64 year old white  female is currently sitting up in the chair she is in no acute distress today HEENT normocephalic atraumatic, does have still persistent vocal hoarseness, also fairly prominent upper airway wheezing.  In comparison to yesterday this is improved Pulmonary: Essentially clear, fine crackles bases.  Currently 4 L nasal cannula Cardiac: Regular rate and rhythm without murmur rub or gallop Abdomen: Soft nontender no organomegaly Extremities: Warm dry dependent edema persists.  Wound vacs in  place Neuro: Awake oriented no focal deficits GU: Clear yellow urine. Assessment & Plan:  Acute hypoxemic respiratory failure- differential is some combination decompensated diastolic heart failure with pulmonary edema/volume overload +/- AECOPD -abx stopped 8/17; mild leukocytosis but no fevers -Oxygen requirements continue to improve, down to 4 L via nasal cannula.  She is -5.2 L Portable chest x-ray is personally reviewed.  This demonstrates significant improvement on the left, however still with pulmonary edema picture more prominent on the right. Plan Continue supplemental oxygen  Continue pulse oximetry  Discontinue BiPAP, he does not want to use it anyway  Continue IV Lasix as long as BUN/creatinine and blood pressure allow  Prednisone taper written  Continue yupelri, Brovana and Pulmicort  Mobilize Flutter Nasal hygiene   Afib/RVR, trop leak, ?new BBB Plan Continuing beta-blocker Eliquis and aspirin  PAD, post op status Plan Per vascular  Muscular deconditioning- Plan Physical therapy and Occupational Therapy  Anxiety, depression, paranoia, question developing ICU delirium, this seems to have improved Plan Continue Zoloft, Lyrica and gabapentin  Critical care is going to sign off, agree she can be transferred out of the intensive care.  Would continue to push diuresis, continue pulse oximetry, wean oxygen.  Erick Colace ACNP-BC [place] Pager # [phone] OR # [phone] if no answer

## 2019-11-07 NOTE — Consult Note (Signed)
[place] Nurse Consult Note: Reason for Consult:Excessive sanguinous output from Left upper thigh wounds resulting in incision line separation from glue softening and Prevena, incisional NPWT system has become dislodged.  Prevena remains intact to left medial calf.  Wound type:surgical Pressure Injury POA: NA Measurement: Left medial thigh:  10 cm incision with glue softened.  Edges approximated but no adhesive noted. Left distal medial thigh:  12 cm incision with glue softened 75%  Prevena intact to left medial calf, not assessed.  Wound bed:not visible  Edges approximated.  Drainage (amount, consistency, odor) heavy serous  No odor.  Periwound:edema to left leg.  Dressing procedure/placement/frequency: mepitel to incision line.  Skin between wounds protected with drape.  Black foam to both incision lines.  Covered with drape.  Seal immediately achieved at 125.  Will leave in place per incisional care protocols.  Will check again 11/12/19 if still inpatient.  Can likely convert to topical wound care upon discharge, per MD discretion.  Will follow.  Domenic Moras MSN, RN, FNP-BC [facility] Wound, Ostomy, Continence Nurse Pager [phone]

## 2019-11-08 LAB — CBC
HCT: 26.4 % — ABNORMAL LOW (ref 36.0–46.0)
Hemoglobin: 8.1 g/dL — ABNORMAL LOW (ref 12.0–15.0)
MCH: 26.5 pg (ref 26.0–34.0)
MCHC: 30.7 g/dL (ref 30.0–36.0)
MCV: 86.3 fL (ref 80.0–100.0)
Platelets: 211 10*3/uL (ref 150–400)
RBC: 3.06 MIL/uL — ABNORMAL LOW (ref 3.87–5.11)
RDW: 17.2 % — ABNORMAL HIGH (ref 11.5–15.5)
WBC: 12 10*3/uL — ABNORMAL HIGH (ref 4.0–10.5)
nRBC: 0.2 % (ref 0.0–0.2)

## 2019-11-08 LAB — BASIC METABOLIC PANEL
Anion gap: 8 (ref 5–15)
BUN: 26 mg/dL — ABNORMAL HIGH (ref 8–23)
CO2: 36 mmol/L — ABNORMAL HIGH (ref 22–32)
Calcium: 8.6 mg/dL — ABNORMAL LOW (ref 8.9–10.3)
Chloride: 93 mmol/L — ABNORMAL LOW (ref 98–111)
Creatinine, Ser: 0.56 mg/dL (ref 0.44–1.00)
GFR calc Af Amer: >60 mL/min (ref >60)
GFR calc non Af Amer: >60 mL/min (ref >60)
Glucose, Bld: 84 mg/dL (ref 70–99)
Potassium: 3.7 mmol/L (ref 3.5–5.1)
Sodium: 137 mmol/L (ref 135–145)

## 2019-11-08 LAB — GLUCOSE, CAPILLARY
Glucose-Capillary: 81 mg/dL (ref 70–99)
Glucose-Capillary: 140 mg/dL — ABNORMAL HIGH (ref 70–99)
Glucose-Capillary: 260 mg/dL — ABNORMAL HIGH (ref 70–99)
Glucose-Capillary: 199 mg/dL — ABNORMAL HIGH (ref 70–99)

## 2019-11-08 LAB — PHOSPHORUS: Phosphorus: 2.4 mg/dL — ABNORMAL LOW (ref 2.5–4.6)

## 2019-11-08 LAB — MAGNESIUM: Magnesium: 2 mg/dL (ref 1.7–2.4)

## 2019-11-08 MED ORDER — FUROSEMIDE 10 MG/ML IJ SOLN
40.0000 mg | Freq: Once | INTRAMUSCULAR | Status: AC
  Administered 2019-11-08: 40 mg via INTRAVENOUS
  Filled 2019-11-08: qty 4

## 2019-11-08 MED ORDER — FUROSEMIDE 10 MG/ML IJ SOLN
40.0000 mg | Freq: Once | INTRAMUSCULAR | Status: AC
  Administered 2019-11-09: 40 mg via INTRAVENOUS
  Filled 2019-11-08: qty 4

## 2019-11-08 NOTE — Progress Notes (Signed)
Mobility Specialist: Progress Note    11/08/19 1350  Mobility  Activity Ambulated in hall  Level of Assistance Minimal assist, patient does 75% or more  Assistive Device Front wheel walker  Distance Ambulated (ft) 130 ft  Mobility Response Tolerated fair  Mobility performed by Mobility specialist  Bed Position Chair  $Mobility charge 1 Mobility   Pre-Mobility: 85 HR, 112/75 BP, 94% SpO2 During Mobility:  Seated: 83% SpO2  1 minute Seated: 90% Post-Mobility: 89 HR, 131/52 BP, 95% SpO2  Pt c/o of soreness in her L leg and bottom. Pt had to stop to take one seated rest break due to SOB and tight feeling in her L calf. I checked pt's sats and her sats had dropped to 83%. We sat for a couple minutes until O2 sats returned to 90% and walked back to her room. Pt sats came back up to mid 90s after ambulation.   [facility] [name] [name] Mobility Specialist

## 2019-11-08 NOTE — Progress Notes (Signed)
Plan of care reviewed. Pt's been progressing. Remained afebrile. Hemodynamically stable, NSR on monitor, HR 60s-70s, BP within normal limits, on HFNCL 4 LPM, SPO2 98-100%, RR 14-20.  Auscultated lung sound had rhonchi and expiratory wheezing bilaterally. Breathing treatment given by RT per scheduled. Pt tolerated pain well. She had productive coughing with white,clear and thick sputum.  Negative 125 mmHg wound vac on left thing and left calf double sealed intact. Y connected to one wound vac machine due to [place] continued to leak and alarm per RN day shift reported. Drainage appeared serosanguinously in cannister. Pt 's able to rest well with no immediate distress. We will continue to monitor.  Kennyth Lose, RN

## 2019-11-08 NOTE — Progress Notes (Addendum)
Progress Note    11/08/2019 7:07 AM 9 Days Post-Op  Subjective:  Now on progressive care unit with wound VACs replaced to LLE incisions.  She says she is still coughing up sputum, feels she still has "10 lbs water retention".  Vitals:   11/08/19 0015 11/08/19 0441  BP: (!) 102/39 (!) 123/52  Pulse: 70 71  Resp: 14 15  Temp: 98.3 F (36.8 C) 98.5 F (36.9 C)  SpO2: 99% 97%   O2 supp: 4L [place]  Physical Exam: Cardiac:  RRR Lungs:  Less rhonchi, no dyspnea at rest Incisions:  Right groin incision without drainage or signs of infection Extremities:  Feet warm and well perfused. Left dorsal and 2nd toes ulcers are dry.  Negative pressure dressings on thigh and lower leg with good seals.   CBC    Component Value Date/Time   WBC 12.0 (H) 11/08/2019 0242   RBC 3.06 (L) 11/08/2019 0242   HGB 8.1 (L) 11/08/2019 0242   HCT 26.4 (L) 11/08/2019 0242   PLT 211 11/08/2019 0242   MCV 86.3 11/08/2019 0242   MCH 26.5 11/08/2019 0242   MCHC 30.7 11/08/2019 0242   RDW 17.2 (H) 11/08/2019 0242   LYMPHSABS 1.7 11/02/2019 2239   MONOABS 1.5 (H) 11/02/2019 2239   EOSABS 0.1 11/02/2019 2239   BASOSABS 0.0 11/02/2019 2239    BMET    Component Value Date/Time   NA 137 11/08/2019 0242   K 3.7 11/08/2019 0242   CL 93 (L) 11/08/2019 0242   CO2 36 (H) 11/08/2019 0242   GLUCOSE 84 11/08/2019 0242   BUN 26 (H) 11/08/2019 0242   CREATININE 0.56 11/08/2019 0242   CREATININE 0.69 01/28/2013 1110   CALCIUM 8.6 (L) 11/08/2019 0242   GFRNONAA >60 11/08/2019 0242   GFRAA >60 11/08/2019 0242     Intake/Output Summary (Last 24 hours) at 11/08/2019 0707 Last data filed at 11/08/2019 0400 Gross per 24 hour  Intake 730 ml  Output 2550 ml  Net -1820 ml    HOSPITAL MEDICATIONS Scheduled Meds: . albuterol  2.5 mg Nebulization BID  . apixaban  5 mg Oral BID  . arformoterol  15 mcg Nebulization BID  . aspirin EC  81 mg Oral Daily  . atorvastatin  40 mg Oral Daily  . budesonide (PULMICORT)  nebulizer solution  0.5 mg Nebulization BID  . Chlorhexidine Gluconate Cloth  6 each Topical Daily  . clopidogrel  75 mg Oral Daily  . docusate sodium  100 mg Oral Daily  . gabapentin  300 mg Oral TID  . insulin aspart  0-20 Units Subcutaneous TID WC  . insulin aspart  0-5 Units Subcutaneous QHS  . insulin aspart protamine- aspart  40 Units Subcutaneous QHS  . metoprolol tartrate  50 mg Oral BID  . nicotine  21 mg Transdermal Daily  . pantoprazole  40 mg Oral Daily  . predniSONE  30 mg Oral Q breakfast   Followed by  . [START ON 11/09/2019] predniSONE  20 mg Oral Q breakfast   Followed by  . [START ON 11/11/2019] predniSONE  10 mg Oral Q breakfast  . pregabalin  150 mg Oral BID  . revefenacin  175 mcg Nebulization Daily  . sertraline  100 mg Oral Daily   Continuous Infusions: . sodium chloride Stopped (11/05/19 1115)  . magnesium sulfate bolus IVPB     PRN Meds:.sodium chloride, acetaminophen **OR** acetaminophen, albuterol, ALPRAZolam, alum & mag hydroxide-simeth, bisacodyl, guaiFENesin-dextromethorphan, labetalol, magnesium sulfate bolus IVPB, metoprolol tartrate, ondansetron,  oxyCODONE-acetaminophen, phenol, potassium chloride, senna-docusate, sodium chloride flush  Assessment: 64 y.o.femaleis s/p Left fem-BK pop bypass with vein and angioplasty of right fem-pop bypasswith post op respiratory failure9 Days Post-Op. Lower extremity bypasses patent.     Heart failure with pulmonary edema/volume overload/COPD>>remians on O2 supp and steroids. Scr normal. CCM has signed off and rec: continued diuresis, O2 monitoring/wean O2 supp   Plan: -Continue pulmonary treatment plan and efforts at mobilization.  Sh -Continue Plavix and aspirin. Monitor incisions. -Disposition plan is home with [facility] PT  Hx a. Fib/RVR on Eliquis   Risa Grill, PA-C [facility] [phone] 11/08/2019  7:07 AM    I agree with the above.  I have seen and evaluated the patient.  She  was transferred to the floor last night.  She is sitting up in the chair and looks much better.  She remains on 4 L of oxygen for her COPD.  She has received steroids for this.  I am going to continue with diuresis over the weekend.  I will give her 40 of Lasix today and tomorrow.  She is on Eliquis for atrial fibrillation.  I have her on Plavix and aspirin as well since she recently had angioplasty of her right leg bypass graft.  She remains on a statin.  When she gets off of her oxygen and has cleared PT, she will be able to go home.  [name]

## 2019-11-08 NOTE — Progress Notes (Signed)
Physical Therapy Treatment Patient Details Name: [name] [name] MRN: [id number] DOB: 06-Oct-1955 Today's Date: 11/08/2019    History of Present Illness 63 y.o. female presenting with Lt leg ulcer, occlusion of R femoral-popliteal bypass graft s/p left femoral-popliteal BPG with groin wound VAC and Rt leg balloon angioplasty SFA . 8/14 pt with AFib with new RBBB, 8/15 progressive hypoxemia. PMHx significant for R femoral-popliteal bypass graf on 04/07/2017, L carotid endartectomy on 06/30/2017, COPD, depression, DM, HLD, HTN, obesity, and tobacco use.    PT Comments    Pt with slow progression towards goals. Reporting increased pain this session, so mobility limited to chair. Required min to min guard for mobility tasks using RW. Performed seated HEP. Current recommendations appropriate. Will continue to follow acutely to maximize functional mobility independence and safety.    Follow Up Recommendations  Home health PT;Supervision for mobility/OOB     Equipment Recommendations  Rolling walker with 5" wheels    Recommendations for Other Services       Precautions / Restrictions Precautions Precautions: Fall Precaution Comments: wound vac , watch sats Restrictions Weight Bearing Restrictions: No    Mobility  Bed Mobility Overal bed mobility: Needs Assistance Bed Mobility: Supine to Sit     Supine to sit: Min assist     General bed mobility comments: Min  A for LLE assist. Increased time required.   Transfers Overall transfer level: Needs assistance Equipment used: Rolling walker (2 wheeled) Transfers: Sit to/from [facility] Sit to Stand: Min assist Stand pivot transfers: Min guard       General transfer comment: Min A for lift assist and steadying to stand. Min guard to take steps to chair. Pt requesting to limit mobility to chair secondary to pain  Ambulation/Gait                 Stairs             Wheelchair Mobility    Modified  Rankin (Stroke Patients Only)       Balance Overall balance assessment: Needs assistance Sitting-balance support: Feet supported Sitting balance-Leahy Scale: Good     Standing balance support: Bilateral upper extremity supported Standing balance-Leahy Scale: Poor Standing balance comment: reliant on UE support                             Cognition Arousal/Alertness: Awake/alert Behavior During Therapy: WFL for tasks assessed/performed Overall Cognitive Status: Within Functional Limits for tasks assessed                                        Exercises General Exercises - Lower Extremity Ankle Circles/Pumps: AROM;Both;20 reps;Seated Long Arc Quad: AROM;Both;10 reps;Seated    General Comments General comments (skin integrity, edema, etc.): oxygen sats at 91% on 4L following gait      Pertinent Vitals/Pain Pain Assessment: Faces Faces Pain Scale: Hurts whole lot Pain Location: LLE with movement Pain Descriptors / Indicators: Aching;Burning Pain Intervention(s): Limited activity within patient's tolerance;Monitored during session;Repositioned    Home Living                      Prior Function            PT Goals (current goals can now be found in the care plan section) Acute Rehab PT Goals Patient Stated Goal: to go  home and be independent  PT Goal Formulation: With patient Time For Goal Achievement: 11/20/19 Potential to Achieve Goals: Good Progress towards PT goals: Progressing toward goals    Frequency    Min 3X/week      PT Plan Current plan remains appropriate    Co-evaluation              AM-PAC PT "6 Clicks" Mobility   Outcome Measure  Help needed turning from your back to your side while in a flat bed without using bedrails?: A Little Help needed moving from lying on your back to sitting on the side of a flat bed without using bedrails?: A Little Help needed moving to and from a bed to a chair  (including a wheelchair)?: A Little Help needed standing up from a chair using your arms (e.g., wheelchair or bedside chair)?: A Little Help needed to walk in hospital room?: A Little Help needed climbing 3-5 steps with a railing? : A Lot 6 Click Score: 17    End of Session Equipment Utilized During Treatment: Gait belt;Oxygen Activity Tolerance: Patient limited by pain Patient left: in chair;with call bell/phone within reach;with nursing/sitter in room Nurse Communication: Mobility status PT Visit Diagnosis: Unsteadiness on feet (R26.81);Muscle weakness (generalized) (M62.81);Other abnormalities of gait and mobility (R26.89)     Time: 3532-9924 PT Time Calculation (min) (ACUTE ONLY): 17 min  Charges:  $Therapeutic Activity: 8-22 mins                     Lou Miner, DPT  Acute Rehabilitation Services  Pager: [phone] Office: [phone]    Rudean Hitt 11/08/2019, 11:53 AM

## 2019-11-09 LAB — BASIC METABOLIC PANEL
Anion gap: 8 (ref 5–15)
BUN: 26 mg/dL — ABNORMAL HIGH (ref 8–23)
CO2: 36 mmol/L — ABNORMAL HIGH (ref 22–32)
Calcium: 8.9 mg/dL (ref 8.9–10.3)
Chloride: 91 mmol/L — ABNORMAL LOW (ref 98–111)
Creatinine, Ser: 0.56 mg/dL (ref 0.44–1.00)
GFR calc Af Amer: >60 mL/min (ref >60)
GFR calc non Af Amer: >60 mL/min (ref >60)
Glucose, Bld: 110 mg/dL — ABNORMAL HIGH (ref 70–99)
Potassium: 3.8 mmol/L (ref 3.5–5.1)
Sodium: 135 mmol/L (ref 135–145)

## 2019-11-09 LAB — CBC
HCT: 25 % — ABNORMAL LOW (ref 36.0–46.0)
Hemoglobin: 7.4 g/dL — ABNORMAL LOW (ref 12.0–15.0)
MCH: 25.6 pg — ABNORMAL LOW (ref 26.0–34.0)
MCHC: 29.6 g/dL — ABNORMAL LOW (ref 30.0–36.0)
MCV: 86.5 fL (ref 80.0–100.0)
Platelets: 211 10*3/uL (ref 150–400)
RBC: 2.89 MIL/uL — ABNORMAL LOW (ref 3.87–5.11)
RDW: 16.9 % — ABNORMAL HIGH (ref 11.5–15.5)
WBC: 13.2 10*3/uL — ABNORMAL HIGH (ref 4.0–10.5)
nRBC: 0 % (ref 0.0–0.2)

## 2019-11-09 LAB — GLUCOSE, CAPILLARY
Glucose-Capillary: 95 mg/dL (ref 70–99)
Glucose-Capillary: 206 mg/dL — ABNORMAL HIGH (ref 70–99)
Glucose-Capillary: 248 mg/dL — ABNORMAL HIGH (ref 70–99)
Glucose-Capillary: 182 mg/dL — ABNORMAL HIGH (ref 70–99)

## 2019-11-09 LAB — MAGNESIUM: Magnesium: 2.2 mg/dL (ref 1.7–2.4)

## 2019-11-09 LAB — PHOSPHORUS: Phosphorus: 3.2 mg/dL (ref 2.5–4.6)

## 2019-11-09 MED ORDER — FUROSEMIDE 10 MG/ML IJ SOLN
40.0000 mg | Freq: Once | INTRAMUSCULAR | Status: AC
  Administered 2019-11-09: 40 mg via INTRAVENOUS
  Filled 2019-11-09: qty 4

## 2019-11-09 MED ORDER — POTASSIUM CHLORIDE CRYS ER 20 MEQ PO TBCR
20.0000 meq | EXTENDED_RELEASE_TABLET | Freq: Two times a day (BID) | ORAL | Status: AC
  Administered 2019-11-09 – 2019-11-10 (×4): 20 meq via ORAL
  Filled 2019-11-09 (×4): qty 1

## 2019-11-09 NOTE — Progress Notes (Signed)
Mobility Specialist: Progress Note    11/09/19 1424  Mobility  Activity Ambulated in hall  Level of Assistance Modified independent, requires aide device or extra time  Assistive Device Front wheel walker  Distance Ambulated (ft) 270 ft  Mobility Response Tolerated well  Mobility performed by Mobility specialist  Bed Position Chair  $Mobility charge 1 Mobility   Pre-Mobility: 62 HR, 123/58 BP, 94% SpO2 During Mobility: 88% SpO2 Post-Mobility: 80 HR, 111/52 BP, 90% SpO2  Pt ambulated on 4L/min [place]. Pt stopped to take one standing rest break just past the nurses station. Her sats dropped to 88%. After resting a minute sats came back up to 91%. Pt said she felt fine to continue. Pt c/o of burning sensation in her R leg but said it wasn't too bad.   [facility] Geraldyne Barraclough Mobility Specialist

## 2019-11-09 NOTE — Progress Notes (Signed)
   VASCULAR SURGERY ASSESSMENT & PLAN:   POD 10 -left below-knee pop bypass with vein: Her bypass graft is functioning well with brisk Doppler signals in the left foot.  The incisional VAC is in place.  Her groin incision looks fine.  ANEMIA: This morning's hemoglobin is 7.4.  This is down slightly from 8.1 yesterday.   PULMONARY EDEMA: Her chest x-ray 2 days ago showed significant pulmonary edema.  I will give her an additional dose of Lasix today.  We will supplement her potassium and recheck her CBC and be met tomorrow morning.  Anticipate discharge on Sunday or Monday.   SUBJECTIVE:   No specific complaints this morning.  PHYSICAL EXAM:   Vitals:   11/08/19 1617 11/08/19 2008 11/08/19 2010 11/08/19 2045  BP: (!) 127/53   124/63  Pulse: 68   92  Resp: 20   20  Temp: 98.2 F (36.8 C)   97.7 F (36.5 C)  TempSrc: Oral   Oral  SpO2: 95% 95% 96% 93%  Weight:      Height:       Her fluid balance was slightly negative yesterday.  She has a brisk DP greater than posterior tibial signal with the Doppler. Incisional vacs in place. Moderate left lower extremity swelling. The left groin incision looks fine.  LABS:   Lab Results  Component Value Date   WBC 13.2 (H) 11/09/2019   HGB 7.4 (L) 11/09/2019   HCT 25.0 (L) 11/09/2019   MCV 86.5 11/09/2019   PLT 211 11/09/2019   Lab Results  Component Value Date   CREATININE 0.56 11/09/2019   Lab Results  Component Value Date   INR 1.1 10/30/2019   CBG (last 3)  Recent Labs    11/08/19 1619 11/08/19 2111 11/09/19 0634  GLUCAP 260* 199* 95    PROBLEM LIST:    Active Problems:   PAD (peripheral artery disease) (HCC)   Respiratory distress   Acute hypoxemic respiratory failure (HCC)   COPD exacerbation (HCC)   CURRENT MEDS:   . albuterol  2.5 mg Nebulization BID  . apixaban  5 mg Oral BID  . arformoterol  15 mcg Nebulization BID  . aspirin EC  81 mg Oral Daily  . atorvastatin  40 mg Oral Daily  . budesonide  (PULMICORT) nebulizer solution  0.5 mg Nebulization BID  . Chlorhexidine Gluconate Cloth  6 each Topical Daily  . clopidogrel  75 mg Oral Daily  . docusate sodium  100 mg Oral Daily  . furosemide  40 mg Intravenous Once  . gabapentin  300 mg Oral TID  . insulin aspart  0-20 Units Subcutaneous TID WC  . insulin aspart  0-5 Units Subcutaneous QHS  . insulin aspart protamine- aspart  40 Units Subcutaneous QHS  . metoprolol tartrate  50 mg Oral BID  . nicotine  21 mg Transdermal Daily  . pantoprazole  40 mg Oral Daily  . predniSONE  20 mg Oral Q breakfast   Followed by  . [START ON 11/11/2019] predniSONE  10 mg Oral Q breakfast  . pregabalin  150 mg Oral BID  . revefenacin  175 mcg Nebulization Daily  . sertraline  100 mg Oral Daily    Deitra Mayo Office: [phone] 11/09/2019

## 2019-11-09 NOTE — Progress Notes (Signed)
Mobility Specialist: Progress Note    11/09/19 1112  Mobility  Activity Ambulated in hall  Level of Assistance Contact guard assist, steadying assist  Assistive Device Front wheel walker  Distance Ambulated (ft) 270 ft  Mobility Response Tolerated fair  Mobility performed by Mobility specialist  Bed Position Chair  $Mobility charge 1 Mobility   Pre-Mobility: 85 HR, 141/68 BP, 91% SpO2 During Mobility: 92 HR, 90% SpO2 Post-Mobility: 86 HR, 126/53 BP, 90% SpO2  Pt c/o of her L leg being feeling tight upon standing. Pt used the [facility] and then we walked in the hallway. Since pt desat yesterday I started her on 6L/min [place] and her sats held at 90% throughout ambulation. Pt stopped at the nurses station to take a standing rest break. After returning to room I put pt back on 4L/min [place]. Pt c/o of feeling a little SOB after walk but said she felt fine.   [facility] [name] [name] Mobility Specialist

## 2019-11-10 LAB — CBC
HCT: 25.7 % — ABNORMAL LOW (ref 36.0–46.0)
Hemoglobin: 7.8 g/dL — ABNORMAL LOW (ref 12.0–15.0)
MCH: 26.2 pg (ref 26.0–34.0)
MCHC: 30.4 g/dL (ref 30.0–36.0)
MCV: 86.2 fL (ref 80.0–100.0)
Platelets: 206 10*3/uL (ref 150–400)
RBC: 2.98 MIL/uL — ABNORMAL LOW (ref 3.87–5.11)
RDW: 16.9 % — ABNORMAL HIGH (ref 11.5–15.5)
WBC: 12.1 10*3/uL — ABNORMAL HIGH (ref 4.0–10.5)
nRBC: 0 % (ref 0.0–0.2)

## 2019-11-10 LAB — GLUCOSE, CAPILLARY
Glucose-Capillary: 82 mg/dL (ref 70–99)
Glucose-Capillary: 186 mg/dL — ABNORMAL HIGH (ref 70–99)
Glucose-Capillary: 232 mg/dL — ABNORMAL HIGH (ref 70–99)
Glucose-Capillary: 182 mg/dL — ABNORMAL HIGH (ref 70–99)

## 2019-11-10 NOTE — Progress Notes (Signed)
Mobility Specialist: Progress Note    11/10/19 1531  Mobility  Activity Ambulated in hall  Level of Assistance Modified independent, requires aide device or extra time  Assistive Device Front wheel walker  Distance Ambulated (ft) 450 ft  Mobility Response Tolerated fair  Mobility performed by Mobility specialist  Bed Position High-fowlers  $Mobility charge 1 Mobility   Pre-Mobility: 69 HR, 129/58 BP, 93% SpO2 During Mobility: 90% SpO2 Post-Mobility: 85 HR, 149/42 BP, 89% SpO2  Pt ambulated on 4L/min [place] w/ sats remaining in the low 90s during ambulation. Pt stopped to take one seated rest break and three standing rest breaks. After returning to room pt desat to 83% but had bad pleth. Sats returned to mid 90s after a couple minutes of sitting w/ good pleth.   [facility] Jaqulyn Chancellor Mobility Specialist

## 2019-11-10 NOTE — Progress Notes (Signed)
Mobility Specialist: Progress Note    11/10/19 1105  Mobility  Activity Ambulated in hall  Level of Assistance Modified independent, requires aide device or extra time  Assistive Device Front wheel walker  Distance Ambulated (ft) 130 ft  Mobility Response Tolerated poorly  Mobility performed by Mobility specialist  Bed Position Chair  $Mobility charge 1 Mobility   Pre-Mobility: 74 HR, 118/55 BP, 97% SpO2 During Mobility: 85 HR, 90% SpO2 Post-Mobility: 75 HR, 130/55 BP, 97% SpO2  Pt ambulated on 4L/min [place]. Pt c/o of feeling a little SOB halfway through walk and said she felt a little wobbly so we went back to the room. Pt said she thinks it's from the medicine she received this morning. Pt is siting up in the chair and said she feels better now. Planning to walk w/ pt later this afternoon.   [facility] Milca Sytsma Mobility Specialist

## 2019-11-10 NOTE — Progress Notes (Addendum)
VASCULAR SURGERY ASSESSMENT & PLAN:   POD 11 -left below-knee pop bypass with vein: Her bypass graft is functioning well with brisk Doppler signals in the left foot.  The incisional VAC is in place.  Her groin incision looks fine. Continue Plavix, asa and statin.  ANEMIA: This morning's hemoglobin is 7.8.  This is up slightly from 7.4 yesterday.   PULMONARY EDEMA: Her chest x-ray 3 days ago showed significant pulmonary edema.  Given additional dose of Lasix yesterday. >2L UOP yesterday. Fluid balance -1860cc. BMET results pending.  Acute hypoxemic respiratory failure- differential is some combination volume overload, AECOPD. Improved; still requiring O2 supplementation. Nebulizers continue. Prednisone taper continues. Will need to get established with pulmonologist.  Atrial fibrillation: Continue Eliquis.  MOBILITY: Requires 4L/min O2 via  to walk in hallways. O2 sats dropped to 88%, back to 91% with rest stop.   SUBJECTIVE:   Feels improvement in LLE edema.   She does not have PCP. She sees an endocrinologist regularly and only uses her daughter's hometown MD on occasion.    PHYSICAL EXAM:   Vitals:   11/09/19 2051 11/09/19 2313 11/10/19 0458 11/10/19 0757  BP: (!) 141/53 (!) 133/50 (!) 141/57   Pulse: 80 61 75   Resp: 16 12 14    Temp: 97.8 F (36.6 C) 98.7 F (37.1 C) 98.3 F (36.8 C)   TempSrc: Oral Oral Oral   SpO2: 93% 100% 100% 98%  Weight:   122.4 kg   Height:       General appearance: Awake, alert in NAD Cardiac: Heart rate and rhythm are regular Respiratory: non-labored at rest Incisions:  Lef groin incision without minimal drainage. ABD pad changed.  Extremities:  Feet warm and well perfused. Brisk Doppler signal of left DP, PT and peroneal arteries. Left dorsal foot and 2nd toe ulcers are dry.  Negative pressure dressings on thigh and lower leg with good seals.   LABS:   Lab Results  Component Value Date   WBC 12.1 (H) 11/10/2019   HGB 7.8 (L)  11/10/2019   HCT 25.7 (L) 11/10/2019   MCV 86.2 11/10/2019   PLT 206 11/10/2019   Lab Results  Component Value Date   CREATININE 0.56 11/09/2019   Lab Results  Component Value Date   INR 1.1 10/30/2019   CBG (last 3)  Recent Labs    11/09/19 1640 11/09/19 2141 11/10/19 0627  GLUCAP 248* 182* 82    PROBLEM LIST:    Active Problems:   PAD (peripheral artery disease) (HCC)   Respiratory distress   Acute hypoxemic respiratory failure (HCC)   COPD exacerbation (HCC)   CURRENT MEDS:   . apixaban  5 mg Oral BID  . arformoterol  15 mcg Nebulization BID  . aspirin EC  81 mg Oral Daily  . atorvastatin  40 mg Oral Daily  . budesonide (PULMICORT) nebulizer solution  0.5 mg Nebulization BID  . Chlorhexidine Gluconate Cloth  6 each Topical Daily  . clopidogrel  75 mg Oral Daily  . docusate sodium  100 mg Oral Daily  . gabapentin  300 mg Oral TID  . insulin aspart  0-20 Units Subcutaneous TID WC  . insulin aspart  0-5 Units Subcutaneous QHS  . insulin aspart protamine- aspart  40 Units Subcutaneous QHS  . metoprolol tartrate  50 mg Oral BID  . nicotine  21 mg Transdermal Daily  . pantoprazole  40 mg Oral Daily  . potassium chloride  20 mEq Oral BID  . predniSONE  20 mg Oral Q breakfast   Followed by  . [START ON 11/11/2019] predniSONE  10 mg Oral Q breakfast  . pregabalin  150 mg Oral BID  . revefenacin  175 mcg Nebulization Daily  . sertraline  100 mg Oral Daily    Barbie Banner, [place] Office: [phone] 11/10/2019   I have interviewed the patient and examined the patient. I agree with the findings by the PA.  Still trying to get her off of oxygen.  She is on a prednisone taper.  In anticipate discharge early this coming week.  Gae Gallop, MD [phone]

## 2019-11-11 ENCOUNTER — Inpatient Hospital Stay (HOSPITAL_COMMUNITY): Payer: Medicare Other

## 2019-11-11 LAB — CBC
HCT: 26.6 % — ABNORMAL LOW (ref 36.0–46.0)
Hemoglobin: 7.8 g/dL — ABNORMAL LOW (ref 12.0–15.0)
MCH: 25.9 pg — ABNORMAL LOW (ref 26.0–34.0)
MCHC: 29.3 g/dL — ABNORMAL LOW (ref 30.0–36.0)
MCV: 88.4 fL (ref 80.0–100.0)
Platelets: 239 10*3/uL (ref 150–400)
RBC: 3.01 MIL/uL — ABNORMAL LOW (ref 3.87–5.11)
RDW: 17 % — ABNORMAL HIGH (ref 11.5–15.5)
WBC: 16.2 10*3/uL — ABNORMAL HIGH (ref 4.0–10.5)
nRBC: 0.2 % (ref 0.0–0.2)

## 2019-11-11 LAB — GLUCOSE, CAPILLARY
Glucose-Capillary: 94 mg/dL (ref 70–99)
Glucose-Capillary: 119 mg/dL — ABNORMAL HIGH (ref 70–99)
Glucose-Capillary: 228 mg/dL — ABNORMAL HIGH (ref 70–99)
Glucose-Capillary: 174 mg/dL — ABNORMAL HIGH (ref 70–99)

## 2019-11-11 LAB — BASIC METABOLIC PANEL
Anion gap: 7 (ref 5–15)
BUN: 22 mg/dL (ref 8–23)
CO2: 35 mmol/L — ABNORMAL HIGH (ref 22–32)
Calcium: 8.8 mg/dL — ABNORMAL LOW (ref 8.9–10.3)
Chloride: 94 mmol/L — ABNORMAL LOW (ref 98–111)
Creatinine, Ser: 0.6 mg/dL (ref 0.44–1.00)
GFR calc Af Amer: >60 mL/min (ref >60)
GFR calc non Af Amer: >60 mL/min (ref >60)
Glucose, Bld: 48 mg/dL — ABNORMAL LOW (ref 70–99)
Potassium: 4.1 mmol/L (ref 3.5–5.1)
Sodium: 136 mmol/L (ref 135–145)

## 2019-11-11 MED ORDER — INSULIN ASPART PROT & ASPART (70-30 MIX) 100 UNIT/ML ~~LOC~~ SUSP
20.0000 [IU] | Freq: Two times a day (BID) | SUBCUTANEOUS | Status: DC
  Administered 2019-11-11 – 2019-11-16 (×11): 20 [IU] via SUBCUTANEOUS
  Filled 2019-11-11: qty 10

## 2019-11-11 MED ORDER — FUROSEMIDE 10 MG/ML IJ SOLN
40.0000 mg | Freq: Once | INTRAMUSCULAR | Status: AC
  Administered 2019-11-11: 40 mg via INTRAVENOUS
  Filled 2019-11-11: qty 4

## 2019-11-11 NOTE — Progress Notes (Signed)
Mobility Specialist: Progress Note    11/11/19 1334  Mobility  Activity Ambulated in hall  Level of Assistance Contact guard assist, steadying assist  Assistive Device Front wheel walker  Distance Ambulated (ft) 190 ft  Mobility Response Tolerated fair  Mobility performed by Mobility specialist  Bed Position Chair  $Mobility charge 1 Mobility   Pre-Mobility: 62 HR, 124/51 BP, 96% SpO2 Post-Mobility: 69 HR, 139/47 BP, 89% SpO2  Pt ambulated on 3L/min . Pt stopped to take one standing rest break at the nurses station lasting less than a minute. Pt said she feels like her walk this morning took a lot out of her. Pt's sats dropped to 89% after ambulation but returned to 92% after a couple minutes of rest. Encouraged pt to walk again later this evening.   [facility] [name] [name] Mobility Specialist

## 2019-11-11 NOTE — Progress Notes (Addendum)
VASCULAR SURGERY ASSESSMENT & PLAN:   POD 12-left below-knee pop bypass with vein:Her bypass graft is functioning well with brisk Doppler signals in the left foot. The incisional VAC is in place. Her groin incision looks fine with minimal drainage. Discuss with Dr. Trula Slade removing Vac dressings to inspect incisions. Continue Plavix, asa and statin. Her WBCs are 16,200 today; afebrile.  ANEMIA:This morning's hemoglobin is 7.8. Unchanged from yesterday.   PULMONARY EDEMA: She received Lasix on Saturday. Pulmonary status unchanged  Acute hypoxemic respiratory failure- differential is some combination volume overload, AECOPD. Improved; still requiring O2 supplementation. Nebulizers continue. Prednisone taper continues. Will need to get established with pulmonologist.  Atrial fibrillation: Continue Eliquis.  MOBILITY: Requires 4L/min O2 via [place] to walk in hallways. O2 sats dropped to 83%, back to 90s% with rest.  SUBJECTIVE:     PHYSICAL EXAM:   Vitals:   11/10/19 1933 11/10/19 2104 11/11/19 0054 11/11/19 0440  BP:  (!) 132/47 138/63 (!) 124/51  Pulse:  83 69 60  Resp:  19 14 14   Temp:  98.6 F (37 C) 98.3 F (36.8 C) 97.8 F (36.6 C)  TempSrc:  Oral Oral Oral  SpO2: 92% 92% 100% 94%  Weight:      Height:       General appearance: Awake, alert in NAD Cardiac: Heart rate and rhythm are regular Respiratory: bilateral rhonchi Incisions:Lef groin incision with minimal drainage. ABD pad changed.  Extremities:Feet warm and well perfused. Sensation and motor function intact. Left dorsal foot and 2nd toe ulcers are dry. Negative pressure dressings on thigh and lower leg with good seals.  Patient states UOP was 600cc yesterday.  LABS:   Lab Results  Component Value Date   WBC 16.2 (H) 11/11/2019   HGB 7.8 (L) 11/11/2019   HCT 26.6 (L) 11/11/2019   MCV 88.4 11/11/2019   PLT 239 11/11/2019   Lab Results  Component Value Date   CREATININE 0.60 11/11/2019    Lab Results  Component Value Date   INR 1.1 10/30/2019   CBG (last 3)  Recent Labs    11/10/19 1600 11/10/19 2132 11/11/19 0615  GLUCAP 232* 182* 94    PROBLEM LIST:    Active Problems:   PAD (peripheral artery disease) (HCC)   Respiratory distress   Acute hypoxemic respiratory failure (HCC)   COPD exacerbation (HCC)   CURRENT MEDS:   . apixaban  5 mg Oral BID  . arformoterol  15 mcg Nebulization BID  . aspirin EC  81 mg Oral Daily  . atorvastatin  40 mg Oral Daily  . budesonide (PULMICORT) nebulizer solution  0.5 mg Nebulization BID  . Chlorhexidine Gluconate Cloth  6 each Topical Daily  . clopidogrel  75 mg Oral Daily  . docusate sodium  100 mg Oral Daily  . gabapentin  300 mg Oral TID  . insulin aspart  0-20 Units Subcutaneous TID WC  . insulin aspart  0-5 Units Subcutaneous QHS  . insulin aspart protamine- aspart  40 Units Subcutaneous QHS  . metoprolol tartrate  50 mg Oral BID  . nicotine  21 mg Transdermal Daily  . pantoprazole  40 mg Oral Daily  . predniSONE  10 mg Oral Q breakfast  . pregabalin  150 mg Oral BID  . revefenacin  175 mcg Nebulization Daily  . sertraline  100 mg Oral Daily    Ortencia Kick Office: [phone] 11/11/2019    Agree with the above.  I will give another dose of  lasix today and repeat her CXR.  Wbc up slightly, will repeat in am  [place] Smera Guyette

## 2019-11-11 NOTE — Progress Notes (Signed)
Physical Therapy Treatment Patient Details Name: [name] [name] MRN: [id number] DOB: [birth year] Today's Date: 11/11/2019    History of Present Illness 64 y.o. female presenting with Lt leg ulcer, occlusion of R femoral-popliteal bypass graft s/p left femoral-popliteal BPG with groin wound VAC and Rt leg balloon angioplasty SFA . 8/14 pt with AFib with new RBBB, 8/15 progressive hypoxemia. PMHx significant for R femoral-popliteal bypass graft on 04/07/2017, L carotid endartectomy on 06/30/2017, COPD, depression, DM, HLD, HTN, obesity, and tobacco use.    PT Comments    Pt pleasant and willing to walk. Pt able to perform long hall ambulation with dependence on RW for support as well as seated HEP. Pt with education for continued progression and reports stairs are short and deep with enough space for RW to fully fit on step for entry into apartment with discussion for stair navigation. Will continue to follow.   96% on 3L at rest, drop to 87% on 4L with gait, 94% on 6L with gait HR 101    Follow Up Recommendations  Home health PT;Supervision for mobility/OOB     Equipment Recommendations  Rolling walker with 5" wheels    Recommendations for Other Services       Precautions / Restrictions Precautions Precautions: Fall Precaution Comments: wound vac , watch sats    Mobility  Bed Mobility               General bed mobility comments: pt on BSC with transfer to EOB on arrival  Transfers Overall transfer level: Needs assistance   Transfers: Sit to/from Stand Sit to Stand: Min guard Stand pivot transfers: Min guard       General transfer comment: guarding with cues for safety to rise from [facility], pivot to bed then rise from bed for gait.  Ambulation/Gait Ambulation/Gait assistance: Min guard Gait Distance (Feet): 350 Feet Assistive device: Rolling walker (2 wheeled) Gait Pattern/deviations: Step-through pattern;Decreased stride length;Trunk flexed   Gait velocity  interpretation: 1.31 - 2.62 ft/sec, indicative of limited community ambulator General Gait Details: cues for posture, and safety with 3 standing rest breaks during gait. Pt required 6L during gait to maintain 94%. Initially on 4L but decreased to 87% during gait   Stairs             Wheelchair Mobility    Modified Rankin (Stroke Patients Only)       Balance Overall balance assessment: Needs assistance   Sitting balance-Leahy Scale: Good     Standing balance support: Bilateral upper extremity supported Standing balance-Leahy Scale: Poor Standing balance comment: reliant on UE support                             Cognition Arousal/Alertness: Awake/alert Behavior During Therapy: WFL for tasks assessed/performed Overall Cognitive Status: Within Functional Limits for tasks assessed                                        Exercises General Exercises - Lower Extremity Long Arc Quad: AROM;Both;Seated;20 reps Hip Flexion/Marching: Seated;Strengthening;Both;15 reps    General Comments        Pertinent Vitals/Pain Pain Score: 4  Pain Location: LLE with movement Pain Descriptors / Indicators: Aching;Guarding Pain Intervention(s): Limited activity within patient's tolerance;Monitored during session;Repositioned    Home Living  Prior Function            PT Goals (current goals can now be found in the care plan section) Progress towards PT goals: Progressing toward goals    Frequency    Min 3X/week      PT Plan Current plan remains appropriate    Co-evaluation              AM-PAC PT "6 Clicks" Mobility   Outcome Measure  Help needed turning from your back to your side while in a flat bed without using bedrails?: A Little Help needed moving from lying on your back to sitting on the side of a flat bed without using bedrails?: A Little Help needed moving to and from a bed to a chair (including a  wheelchair)?: A Little Help needed standing up from a chair using your arms (e.g., wheelchair or bedside chair)?: A Little Help needed to walk in hospital room?: A Little Help needed climbing 3-5 steps with a railing? : A Lot 6 Click Score: 17    End of Session Equipment Utilized During Treatment: Gait belt;Oxygen Activity Tolerance: Patient tolerated treatment well Patient left: in chair;with call bell/phone within reach;with chair alarm set Nurse Communication: Mobility status PT Visit Diagnosis: Unsteadiness on feet (R26.81);Muscle weakness (generalized) (M62.81);Other abnormalities of gait and mobility (R26.89)     Time: 4599-7741 PT Time Calculation (min) (ACUTE ONLY): 28 min  Charges:  $Gait Training: 8-22 mins $Therapeutic Exercise: 8-22 mins                     Delorus Langwell P, PT Acute Rehabilitation Services Pager: [phone] Office: [place] 11/11/2019, 12:58 PM

## 2019-11-11 NOTE — Progress Notes (Signed)
Inpatient Diabetes Program Recommendations  AACE/ADA: New Consensus Statement on Inpatient Glycemic Control (2015)  Target Ranges:  Prepandial:   less than 140 mg/dL      Peak postprandial:   less than 180 mg/dL (1-2 hours)      Critically ill patients:  140 - 180 mg/dL   Lab Results  Component Value Date   GLUCAP 94 11/11/2019   HGBA1C 6.4 (H) 11/05/2019    Review of Glycemic Control Results for [name] [name], [name] [name] (MRN [id number]) as of 11/11/2019 10:55  Ref. Range 11/10/2019 12:10 11/10/2019 16:00 11/10/2019 21:32 11/11/2019 06:15  Glucose-Capillary Latest Ref Range: 70 - 99 mg/dL 186 (H) 232 (H) 182 (H) 94   Diabetes history: Type 2 DM Outpatient Diabetes medications: Novolog 70/30 75 units BID, Metformin 1000 mg BID Current orders for Inpatient glycemic control: Novolog 70/30 40 units QHS, Novolog 0-20 units TID, Novolog 0-5 units QHS  Inpatient Diabetes Program Recommendations:     Noted glucose serum was 48 mg/dL following bedtime dose of 70/30 (which is usually given with meals to prevent hypoglycemia).  Consider changing Novolog 70/30 to 20 units BID.   Thanks, Bronson Curb, MSN, RNC-OB Diabetes Coordinator [phone] (8a-5p)

## 2019-11-12 LAB — GLUCOSE, CAPILLARY
Glucose-Capillary: 141 mg/dL — ABNORMAL HIGH (ref 70–99)
Glucose-Capillary: 146 mg/dL — ABNORMAL HIGH (ref 70–99)
Glucose-Capillary: 167 mg/dL — ABNORMAL HIGH (ref 70–99)
Glucose-Capillary: 156 mg/dL — ABNORMAL HIGH (ref 70–99)

## 2019-11-12 LAB — CBC
HCT: 26 % — ABNORMAL LOW (ref 36.0–46.0)
HCT: 29.5 % — ABNORMAL LOW (ref 36.0–46.0)
Hemoglobin: 7.6 g/dL — ABNORMAL LOW (ref 12.0–15.0)
Hemoglobin: 9 g/dL — ABNORMAL LOW (ref 12.0–15.0)
MCH: 25.2 pg — ABNORMAL LOW (ref 26.0–34.0)
MCH: 26.5 pg (ref 26.0–34.0)
MCHC: 29.2 g/dL — ABNORMAL LOW (ref 30.0–36.0)
MCHC: 30.5 g/dL (ref 30.0–36.0)
MCV: 86.4 fL (ref 80.0–100.0)
MCV: 86.8 fL (ref 80.0–100.0)
Platelets: 210 10*3/uL (ref 150–400)
Platelets: 210 10*3/uL (ref 150–400)
RBC: 3.01 MIL/uL — ABNORMAL LOW (ref 3.87–5.11)
RBC: 3.4 MIL/uL — ABNORMAL LOW (ref 3.87–5.11)
RDW: 17.2 % — ABNORMAL HIGH (ref 11.5–15.5)
RDW: 16.8 % — ABNORMAL HIGH (ref 11.5–15.5)
WBC: 12.7 10*3/uL — ABNORMAL HIGH (ref 4.0–10.5)
WBC: 14.5 10*3/uL — ABNORMAL HIGH (ref 4.0–10.5)
nRBC: 0 % (ref 0.0–0.2)
nRBC: 0.1 % (ref 0.0–0.2)

## 2019-11-12 LAB — BASIC METABOLIC PANEL
Anion gap: 9 (ref 5–15)
BUN: 21 mg/dL (ref 8–23)
CO2: 32 mmol/L (ref 22–32)
Calcium: 8.6 mg/dL — ABNORMAL LOW (ref 8.9–10.3)
Chloride: 93 mmol/L — ABNORMAL LOW (ref 98–111)
Creatinine, Ser: 0.56 mg/dL (ref 0.44–1.00)
GFR calc Af Amer: >60 mL/min (ref >60)
GFR calc non Af Amer: >60 mL/min (ref >60)
Glucose, Bld: 155 mg/dL — ABNORMAL HIGH (ref 70–99)
Potassium: 3.9 mmol/L (ref 3.5–5.1)
Sodium: 134 mmol/L — ABNORMAL LOW (ref 135–145)

## 2019-11-12 LAB — PREPARE RBC (CROSSMATCH)

## 2019-11-12 MED ORDER — FUROSEMIDE 10 MG/ML IJ SOLN
40.0000 mg | Freq: Once | INTRAMUSCULAR | Status: AC
  Administered 2019-11-12: 40 mg via INTRAVENOUS
  Filled 2019-11-12: qty 4

## 2019-11-12 MED ORDER — SODIUM CHLORIDE 0.9% IV SOLUTION: Freq: Once | INTRAVENOUS | Status: AC

## 2019-11-12 NOTE — Progress Notes (Signed)
Came to evaluate bleeding from left heel wound.  Dressing removed.  There was a steady stream of blood coming from her ulcer.  I held pressure for 15 minutes.  It was still bleeding so I placed a pressure dressing which controlled.  If this is still bleeding tomorrow, I will need to get a topical hemostatic agent from the OR and consider holding her anticoagulation  Wells Equan Cogbill

## 2019-11-12 NOTE — Progress Notes (Signed)
OT Cancellation Note  Patient Details Name: [name] [name] MRN: [id number] DOB: 1955/07/18   Cancelled Treatment:    Reason Eval/Treat Not Completed: Medical issues which prohibited therapy Pt getting blood; will reconvene as time permits.   Corinne Ports E. Neziah Braley, COTA/L Acute Rehabilitation Services [phone] [place] 11/12/2019, 11:20 AM

## 2019-11-12 NOTE — Progress Notes (Addendum)
[facility]  Subjective  - States she feels a little dizzy when she gets up.   Objective (!) 132/56 76 98 F (36.7 C) (Oral) 18 93%  Intake/Output Summary (Last 24 hours) at 11/12/2019 0717 Last data filed at 11/11/2019 2045 Gross per 24 hour  Intake 360 ml  Output 800 ml  Net -440 ml    Doppler signals PT/DP/PT intact.  Foot wounds dry Medial leg incisional wound vacs to suction 100 + drainage Left groin clean with ABD pad to keep it dry Lungs O2 3 L [place] SAT 93% Heart RRR  FINDINGS: Stable cardiomediastinal contours. Atherosclerotic calcification of the aortic knob. Improved aeration of the lung bases compared to prior. Diffuse interstitial opacities persist throughout both lungs. No pleural effusion or pneumothorax.  IMPRESSION: Improved aeration of the lung bases compared to prior. Diffuse interstitial opacities persist throughout both lungs.  Assessment/Planning: POD # 13 left fem-pop bypass Pneumonia chest x ray shows some improvements and O2 demand decreasing some over time  WBC leukocytosis improving HBG 7.6 symptomatic anemia will transfuse 1 unit PRBC today Wound vac will need to be changed will ask wound care nurses to change vac on medial left LE.  Roxy Horseman 11/12/2019 7:17 AM --  Laboratory Lab Results: Recent Labs    11/11/19 0211 11/12/19 0520  WBC 16.2* 12.7*  HGB 7.8* 7.6*  HCT 26.6* 26.0*  PLT 239 210   BMET Recent Labs    11/11/19 0211 11/12/19 0520  NA 136 134*  K 4.1 3.9  CL 94* 93*  CO2 35* 32  GLUCOSE 48* 155*  BUN 22 21  CREATININE 0.60 0.56  CALCIUM 8.8* 8.6*    COAG Lab Results  Component Value Date   INR 1.1 10/30/2019   INR 1.08 06/26/2017   INR 1.02 04/07/2017   No results found for: PTT  I agree with the above.  I have seen and evaluated the patient.  She looks better today.  She has received blood which helped her energy level and her fatigue.  Her white count has  decreased from 16-14.  She has a good seal on her incisional wound VAC.  Her incisions are healing appropriately.  I am giving her an additional dose of Lasix today.  [name] [name]

## 2019-11-12 NOTE — Progress Notes (Addendum)
Found moderate amount of blood on pt's L side of L heal ulcer. Dressing changed done by charted nurse with kerlix and ABD pad warpping. Pt stable. \  UPDATED: At 0757, pt's dressing on L foot saturated. MD notified. Reinforced the dressing per MD order. Will keep monitor pt.     Lavenia Atlas, RN

## 2019-11-12 NOTE — Progress Notes (Signed)
Mobility Specialist: Progress Note   11/12/19 1900  Mobility  Activity  (Cancel)   After consulting w/ RN, not going to work w/ pt today.   [facility] Sherill Wegener Mobility Specialist

## 2019-11-12 NOTE — Consult Note (Signed)
[place] Nurse wound follow up Wound type: closed incisional with NPWT Prevena dressing in place LLE medial calf however it is leaking all from under dressing. Traditional incisional NPWT to the upper medial thigh wounds Measurement: Proximal 5cm  Medial 7cm  Distal 9cm  Wound bed: closed incisions, slight separation noted in the proximal and distal incisions; more noted in the most proximal incision  Drainage (amount, consistency, odor) serosanguinous in VAC canister  Periwound: edematous with bruising   Dressing procedure/placement/frequency: Mepitel used to cover the 3 closed incisions 1pc of black foam used to cover the medial and proximal incisions.  1pc of black foam used to cover the the distal incision along the medial calf.  Seal obtained at 185mmHG. Patient tolerated well.  Reinforced edges and behind the patient's knee in order for dressing to be maintained x 1 week. Explained next change schedule to patient and that she may not go home with the machine and dressings.   [place] nurse will continue to provide NPWT dressing changed due to the complexity of the dressing change.

## 2019-11-13 LAB — BASIC METABOLIC PANEL
Anion gap: 7 (ref 5–15)
BUN: 20 mg/dL (ref 8–23)
CO2: 33 mmol/L — ABNORMAL HIGH (ref 22–32)
Calcium: 8.3 mg/dL — ABNORMAL LOW (ref 8.9–10.3)
Chloride: 93 mmol/L — ABNORMAL LOW (ref 98–111)
Creatinine, Ser: 0.57 mg/dL (ref 0.44–1.00)
GFR calc Af Amer: >60 mL/min (ref >60)
GFR calc non Af Amer: >60 mL/min (ref >60)
Glucose, Bld: 127 mg/dL — ABNORMAL HIGH (ref 70–99)
Potassium: 3.8 mmol/L (ref 3.5–5.1)
Sodium: 133 mmol/L — ABNORMAL LOW (ref 135–145)

## 2019-11-13 LAB — CBC
HCT: 26.6 % — ABNORMAL LOW (ref 36.0–46.0)
Hemoglobin: 8.1 g/dL — ABNORMAL LOW (ref 12.0–15.0)
MCH: 26.6 pg (ref 26.0–34.0)
MCHC: 30.5 g/dL (ref 30.0–36.0)
MCV: 87.2 fL (ref 80.0–100.0)
Platelets: 204 10*3/uL (ref 150–400)
RBC: 3.05 MIL/uL — ABNORMAL LOW (ref 3.87–5.11)
RDW: 16.9 % — ABNORMAL HIGH (ref 11.5–15.5)
WBC: 13.8 10*3/uL — ABNORMAL HIGH (ref 4.0–10.5)
nRBC: 0 % (ref 0.0–0.2)

## 2019-11-13 LAB — BPAM RBC
Blood Product Expiration Date: 202109222359
ISSUE DATE / TIME: 202108241119
Unit Type and Rh: 5100

## 2019-11-13 LAB — TYPE AND SCREEN
ABO/RH(D): O POS
Antibody Screen: NEGATIVE
Unit division: 0

## 2019-11-13 LAB — GLUCOSE, CAPILLARY
Glucose-Capillary: 130 mg/dL — ABNORMAL HIGH (ref 70–99)
Glucose-Capillary: 107 mg/dL — ABNORMAL HIGH (ref 70–99)
Glucose-Capillary: 169 mg/dL — ABNORMAL HIGH (ref 70–99)
Glucose-Capillary: 164 mg/dL — ABNORMAL HIGH (ref 70–99)

## 2019-11-13 MED ORDER — COLLAGENASE 250 UNIT/GM EX OINT
TOPICAL_OINTMENT | Freq: Every day | CUTANEOUS | Status: DC
  Filled 2019-11-13: qty 30

## 2019-11-13 MED ORDER — FUROSEMIDE 10 MG/ML IJ SOLN
40.0000 mg | Freq: Once | INTRAMUSCULAR | Status: AC
  Administered 2019-11-13: 40 mg via INTRAVENOUS
  Filled 2019-11-13: qty 4

## 2019-11-13 NOTE — Progress Notes (Signed)
Mobility Specialist: Progress Note    11/13/19 1734  Mobility  Activity Ambulated in hall  Level of Assistance Modified independent, requires aide device or extra time  Assistive Device Front wheel walker  Distance Ambulated (ft) 280 ft  Mobility Response Tolerated fair  Mobility performed by Mobility specialist  Bed Position Chair  $Mobility charge 1 Mobility   Pre-Mobility: 61 HR, 125/41 BP, 96% SpO2 During Mobility: 88% SpO2 Post-Mobility: 72 HR, 143/45 BP, 93% SpO2  Pt began ambulation on 3L/min [place]. Pt took two standing rest breaks throughout ambulation due to c/o of SOB. I checked pt's sats during the second rest break and she had desat to 88%. I increased [place] flow to 4L/min to finish ambulation. Pt is resting in the chair on 3L/min [place] w/ sats maintaining in the low 90s.   [facility] Orlo Brickle Mobility Specialist

## 2019-11-13 NOTE — Progress Notes (Addendum)
VASCULAR SURGERY ASSESSMENT & PLAN:   POD 14-left below-knee pop bypass with vein:Her bypass graft is functioning well with brisk Doppler signals in the left foot. Bleeding last night from heel wound. Ulcer is very tender but not bleeding. Will see if we can place some sort of [place] boot dressing.The incisional VAC is in place. Her groin incision looks fine. Continue Plavix, asa and statin.  ANEMIA:This morning's hemoglobin is 8.1. She is s/p 1 u PRBCS yesterday with subsequent bleeding from left heel ulcer.  PULMONARY EDEMA: Given additional dose of Lasix yesterday. 2L UOP yesterday. Fluid balance -1400cc.K+ 3.8  Acute hypoxemic respiratory failure- differential is some combination volume overload, AECOPD. Improved; still requiring O2 supplementation. Nebulizers continue. Off Prednisone. Will need to get established with pulmonologist.  Atrial fibrillation: Continue Eliquis.  MOBILITY: Requires 3L/min O2. Did not work with mobility specialist yesterday  SUBJECTIVE:   C/o of morning productive cough  PHYSICAL EXAM:   Vitals:   11/12/19 1428 11/12/19 1922 11/13/19 0044 11/13/19 0504  BP: 138/67 134/82 (!) 119/55 (!) 141/68  Pulse: 65 82 60 63  Resp: 15 20 20 17   Temp: 98.6 F (37 C) 98.2 F (36.8 C) 98.3 F (36.8 C) 97.8 F (36.6 C)  TempSrc: Oral Oral Oral Oral  SpO2: 97% 98% 98% 98%  Weight:    124.2 kg  Height:       General appearance: Awake, alert in NAD Cardiac: Heart rate and rhythm are regular Respiratory: non-labored at rest Incisions:Left groin incision with minimal drainage. Extremities: Left ankle dressing removed. No bleeding. Dressing replaced. Negative pressure dressings on thigh and lower leg with good seals. Feet warm and well perfused. Brisk Doppler signal of left DP, PT and peroneal arteries. Left dorsal foot and 2nd toe ulcers are dry.     LABS:   Lab Results  Component Value Date   WBC 13.8 (H) 11/13/2019   HGB 8.1 (L) 11/13/2019     HCT 26.6 (L) 11/13/2019   MCV 87.2 11/13/2019   PLT 204 11/13/2019   Lab Results  Component Value Date   CREATININE 0.57 11/13/2019   Lab Results  Component Value Date   INR 1.1 10/30/2019   CBG (last 3)  Recent Labs    11/12/19 1612 11/12/19 2131 11/13/19 0615  GLUCAP 167* 156* 130*    PROBLEM LIST:    Active Problems:   PAD (peripheral artery disease) (HCC)   Respiratory distress   Acute hypoxemic respiratory failure (HCC)   COPD exacerbation (HCC)   CURRENT MEDS:   . apixaban  5 mg Oral BID  . arformoterol  15 mcg Nebulization BID  . aspirin EC  81 mg Oral Daily  . atorvastatin  40 mg Oral Daily  . budesonide (PULMICORT) nebulizer solution  0.5 mg Nebulization BID  . Chlorhexidine Gluconate Cloth  6 each Topical Daily  . clopidogrel  75 mg Oral Daily  . docusate sodium  100 mg Oral Daily  . gabapentin  300 mg Oral TID  . insulin aspart  0-20 Units Subcutaneous TID WC  . insulin aspart  0-5 Units Subcutaneous QHS  . insulin aspart protamine- aspart  20 Units Subcutaneous BID WC  . metoprolol tartrate  50 mg Oral BID  . nicotine  21 mg Transdermal Daily  . pantoprazole  40 mg Oral Daily  . revefenacin  175 mcg Nebulization Daily  . sertraline  100 mg Oral Daily    Barbie Banner, [place] Office: [phone] 11/13/2019    I  agree with the above.  I have seen and evaluated the patient.  The bleeding from her ankle ulcer has stopped.  Her Hb is stable.  She remains on #l [place].  I will given another does of IV lasix.  We will try and get her home by Friday, hopefully she weill not require home [place]

## 2019-11-13 NOTE — Progress Notes (Signed)
Occupational Therapy Treatment Patient Details Name: [name] [name] MRN: [id number] DOB: [birth year] Today's Date: 11/13/2019    History of present illness 64 y.o. female presenting with Lt leg ulcer, occlusion of R femoral-popliteal bypass graft s/p left femoral-popliteal BPG with groin wound VAC and Rt leg balloon angioplasty SFA . 8/14 pt with AFib with new RBBB, 8/15 progressive hypoxemia. PMHx significant for R femoral-popliteal bypass graft on 04/07/2017, L carotid endartectomy on 06/30/2017, COPD, depression, DM, HLD, HTN, obesity, and tobacco use.   OT comments  Pt completed 3n1 over commode transfer on 3L oxygen min guard (A)  RW. Pt requires min (A) for line management at this time for wound vac and oxygen tubing. Pt pleasant and progressing well. Pt able to return demonstrate reach to mid calf at this time. Pt eager to d/c home when appropriate and hopes to not require oxygen.    Follow Up Recommendations  Home health OT;Supervision/Assistance - 24 hour    Equipment Recommendations  3 in 1 bedside commode;Tub/shower bench    Recommendations for Other Services      Precautions / Restrictions Precautions Precautions: Fall Precaution Comments: 3L oxygen Restrictions Weight Bearing Restrictions: Yes RLE Weight Bearing: Weight bearing as tolerated LLE Weight Bearing: Weight bearing as tolerated       Mobility Bed Mobility               General bed mobility comments: oob on arrival  Transfers Overall transfer level: Needs assistance Equipment used: Rolling walker (2 wheeled) Transfers: Sit to/from Stand Sit to Stand: Min guard         General transfer comment: requires use of bil Ue on chair arm rest    Balance                                           ADL either performed or assessed with clinical judgement   ADL Overall ADL's : Needs assistance/impaired Eating/Feeding: Modified independent   Grooming: Wash/dry  hands;Supervision/safety;Standing               Lower Body Dressing: Minimal assistance;Sit to/from stand   Toilet Transfer: Min guard;RW;BSC (over normal commode )   Toileting- Water quality scientist and Hygiene: Supervision/safety;Sit to/from stand       Functional mobility during ADLs: Supervision/safety;Rolling walker General ADL Comments: pt requires min (A) to manage oxygen due to length and distance to bathroom     Vision       Perception     Praxis      Cognition Arousal/Alertness: Awake/alert Behavior During Therapy: WFL for tasks assessed/performed Overall Cognitive Status: Within Functional Limits for tasks assessed                                          Exercises Exercises: Other exercises General Exercises - Lower Extremity Ankle Circles/Pumps: 10 reps;Both;AROM;Seated Quad Sets: AROM;10 reps;Both;Seated Gluteal Sets: AROM;Both;10 reps;Seated Heel Slides: AROM;10 reps;Seated Hip ABduction/ADduction: Both;5 reps;AROM;Seated Hip Flexion/Marching: AROM;Both;10 reps;Seated Other Exercises Other Exercises: pt educated on washing with clean linen around dressing sites and avoiding washing directly on cuts or application of lotion Other Exercises: educated on clean fresh linen everytime Other Exercises: pt plans to use a dress for clothing so if pt does continue to have wound vac portable will need education on  wearing it around neck only   Shoulder Instructions       General Comments 3L [place] required throughout session.     Pertinent Vitals/ Pain       Pain Assessment: Faces Faces Pain Scale: Hurts a little bit Pain Location: LLE with movement Pain Descriptors / Indicators: Sore Pain Intervention(s): Monitored during session;Premedicated before session;Repositioned  Home Living                                          Prior Functioning/Environment              Frequency  Min 2X/week        Progress  Toward Goals  OT Goals(current goals can now be found in the care plan section)  Progress towards OT goals: Progressing toward goals  Acute Rehab OT Goals Patient Stated Goal: to go home and be independent  OT Goal Formulation: With patient Time For Goal Achievement: 11/28/19 Potential to Achieve Goals: Good ADL Goals Pt Will Perform Lower Body Bathing: with modified independence;with adaptive equipment;sitting/lateral leans;sit to/from stand Pt Will Perform Lower Body Dressing: with modified independence;with adaptive equipment;sitting/lateral leans;sit to/from stand Pt Will Transfer to Toilet: with modified independence;ambulating Pt Will Perform Toileting - Clothing Manipulation and hygiene: with modified independence;sitting/lateral leans;sit to/from stand  Plan Discharge plan remains appropriate    Co-evaluation                 AM-PAC OT "6 Clicks" Daily Activity     Outcome Measure   Help from another person eating meals?: None Help from another person taking care of personal grooming?: None Help from another person toileting, which includes using toliet, bedpan, or urinal?: A Little Help from another person bathing (including washing, rinsing, drying)?: A Little Help from another person to put on and taking off regular upper body clothing?: None Help from another person to put on and taking off regular lower body clothing?: A Little 6 Click Score: 21    End of Session Equipment Utilized During Treatment: Oxygen;Rolling walker  OT Visit Diagnosis: Unsteadiness on feet (R26.81);Pain Pain - Right/Left: Left Pain - part of body: Leg   Activity Tolerance Patient tolerated treatment well   Patient Left in chair;with call bell/phone within reach;with chair alarm set   Nurse Communication Mobility status        Time: 4801-6553 OT Time Calculation (min): 27 min  Charges: OT General Charges $OT Visit: 1 Visit OT Treatments $Self Care/Home Management : 23-37  mins   [place], OTR/L  Acute Rehabilitation Services Pager: [phone] Office: [phone] .    Jeri Modena 11/13/2019, 3:41 PM

## 2019-11-13 NOTE — Progress Notes (Signed)
Physical Therapy Treatment Patient Details Name: [name] [name] MRN: [id number] DOB: 02-May-1955 Today's Date: 11/13/2019    History of Present Illness 64 y.o. female presenting with Lt leg ulcer, occlusion of R femoral-popliteal bypass graft s/p left femoral-popliteal BPG with groin wound VAC and Rt leg balloon angioplasty SFA . 8/14 pt with AFib with new RBBB, 8/15 progressive hypoxemia. PMHx significant for R femoral-popliteal bypass graft on 04/07/2017, L carotid endartectomy on 06/30/2017, COPD, depression, DM, HLD, HTN, obesity, and tobacco use.    PT Comments    Pt was seen for gait and mobility and noted her struggle to maintain sats on 3L.  Ceased conversation, and with titration to 5L briefly could finish walk.  Desaturated to 87% at rest and then back to 93% with 3L level.  Continue to monitor sats and maintain close observation on the hall.  Pt is aware of the challenges and is expecting MD to try to get her home without O2 if possible.   Follow Up Recommendations  Home health PT;Supervision for mobility/OOB     Equipment Recommendations  Rolling walker with 5" wheels    Recommendations for Other Services       Precautions / Restrictions Precautions Precautions: Fall Precaution Comments: titrated 3L for gait Restrictions Weight Bearing Restrictions: No RLE Weight Bearing: Weight bearing as tolerated LLE Weight Bearing: Weight bearing as tolerated    Mobility  Bed Mobility               General bed mobility comments: in chair and declined back to bed  Transfers Overall transfer level: Needs assistance Equipment used: Rolling walker (2 wheeled) Transfers: Sit to/from Stand Sit to Stand: Min guard         General transfer comment: requires use of bil Ue on chair arm rest  Ambulation/Gait Ambulation/Gait assistance: Min guard Gait Distance (Feet): 150 Feet Assistive device: Rolling walker (2 wheeled) Gait Pattern/deviations: Step-through pattern;Wide  base of support;Decreased weight shift to left Gait velocity: reduced Gait velocity interpretation: <1.31 ft/sec, indicative of household ambulator General Gait Details: has some discomfort with vac but is controlling pace of gait   Stairs             Wheelchair Mobility    Modified Rankin (Stroke Patients Only)       Balance Overall balance assessment: Needs assistance Sitting-balance support: Feet supported Sitting balance-Leahy Scale: Good     Standing balance support: Bilateral upper extremity supported Standing balance-Leahy Scale: Fair Standing balance comment: using walker mainly for dynamic skills                            Cognition Arousal/Alertness: Awake/alert Behavior During Therapy: WFL for tasks assessed/performed Overall Cognitive Status: Within Functional Limits for tasks assessed                                        Exercises General Exercises - Lower Extremity Ankle Circles/Pumps: 10 reps;Both;AROM;Seated Quad Sets: AROM;10 reps;Both;Seated Gluteal Sets: AROM;Both;10 reps;Seated Heel Slides: AROM;10 reps;Seated Hip ABduction/ADduction: Both;5 reps;AROM;Seated Hip Flexion/Marching: AROM;Both;10 reps;Seated Other Exercises Other Exercises: pt educated on washing with clean linen around dressing sites and avoiding washing directly on cuts or application of lotion Other Exercises: educated on clean fresh linen everytime Other Exercises: pt plans to use a dress for clothing so if pt does continue to have wound vac portable  will need education on wearing it around neck only    General Comments General comments (skin integrity, edema, etc.): 3L O2 initially but then dropped sats on the hall.  Titrated to 5 L briefly as sat went to 80%.  Returned to 3L and desaturated to 87% briefly at rest, then back to 93%      Pertinent Vitals/Pain Pain Assessment: Faces Faces Pain Scale: Hurts a little bit Pain Location: LLE with  movement Pain Descriptors / Indicators: Sore Pain Intervention(s): Limited activity within patient's tolerance;Repositioned    Home Living                      Prior Function            PT Goals (current goals can now be found in the care plan section) Acute Rehab PT Goals Patient Stated Goal: get home Progress towards PT goals: Progressing toward goals    Frequency    Min 3X/week      PT Plan Current plan remains appropriate    Co-evaluation              AM-PAC PT "6 Clicks" Mobility   Outcome Measure  Help needed turning from your back to your side while in a flat bed without using bedrails?: A Little Help needed moving from lying on your back to sitting on the side of a flat bed without using bedrails?: A Little Help needed moving to and from a bed to a chair (including a wheelchair)?: A Little Help needed standing up from a chair using your arms (e.g., wheelchair or bedside chair)?: A Little Help needed to walk in hospital room?: A Little Help needed climbing 3-5 steps with a railing? : A Lot 6 Click Score: 17    End of Session Equipment Utilized During Treatment: Gait belt;Oxygen Activity Tolerance: Patient tolerated treatment well;Treatment limited secondary to medical complications (Comment) Patient left: in chair;with call bell/phone within reach Nurse Communication: Mobility status PT Visit Diagnosis: Unsteadiness on feet (R26.81);Muscle weakness (generalized) (M62.81);Other abnormalities of gait and mobility (R26.89)     Time: 1287-8676 PT Time Calculation (min) (ACUTE ONLY): 30 min  Charges:  $Gait Training: 8-22 mins $Therapeutic Activity: 8-22 mins                    Ramond Dial 11/13/2019, 5:28 PM  Mee Hives, PT MS Acute Rehab Dept. Number: [place] [facility] [place]

## 2019-11-13 NOTE — Consult Note (Signed)
WOC Nurse Consult Note: Reason for Consult: ankle wound Known PAD with recent revascularization; dry necrosis of the toe on the same foot and dry necrosis of the first metatarsal head dorsal foot region Wound type: vascular in the presence of DM  Pressure Injury POA: NA Measurement: 1cm x 1cm x 0.2cm  Wound bed:100% yellow fibrinous Drainage (amount, consistency, odor)  Apparently began to actively bleed yesterday. Dressed several times by the nursing staff. Compression dressing applied by Dr. Trula Slade No active bleeding today, scant serosanguineous drainage on the non adherent pad   Periwound: intact see above description of the toe ulcerations.  Dressing procedure/placement/frequency: Add enzymatic debridement to clean up wound bed now that blood flow restored. Top with alginate for hemostatic properties. Dry dressing topper with light compression with single layer of coban just around the ankle/over the wound.   Thanks  Deshun Sedivy [employer], RN,CWOCN, CNS, [place] [phone])

## 2019-11-14 ENCOUNTER — Inpatient Hospital Stay (HOSPITAL_COMMUNITY): Payer: Medicare Other

## 2019-11-14 LAB — GLUCOSE, CAPILLARY
Glucose-Capillary: 138 mg/dL — ABNORMAL HIGH (ref 70–99)
Glucose-Capillary: 152 mg/dL — ABNORMAL HIGH (ref 70–99)
Glucose-Capillary: 143 mg/dL — ABNORMAL HIGH (ref 70–99)
Glucose-Capillary: 218 mg/dL — ABNORMAL HIGH (ref 70–99)

## 2019-11-14 MED ORDER — FUROSEMIDE 10 MG/ML IJ SOLN
40.0000 mg | Freq: Every day | INTRAMUSCULAR | Status: DC
  Administered 2019-11-14: 40 mg via INTRAVENOUS
  Filled 2019-11-14: qty 4

## 2019-11-14 NOTE — Progress Notes (Signed)
Mobility Specialist - Progress Note   11/14/19 1407  Mobility  Activity Ambulated in hall  Level of Assistance Modified independent, requires aide device or extra time  Assistive Device Front wheel walker  Distance Ambulated (ft) 200 ft (Intervals: 160 ft x 1, 40 ft x 1)  Mobility Response Tolerated fair  Mobility performed by Mobility specialist  $Mobility charge 1 Mobility    Pre-mobility: 64 HR, 92% SpO2 During mobility: 78 HR, 86-91% SpO2 Post-mobility: 68 HR, 90% SpO2  Ambulated pt on 3 L/min of O2. Pt required one sitting rest break at ~160 ft due to feeling SOB. Her SpO2 stayed between 86%-88% with pursed lip breathing while initially standing. She then sat down and it continued to maintain in that range, her O2 was increased to 4 L/min and after resting some more she was able to maintain 89% for the rest of her walk.   Pricilla Handler Mobility Specialist Mobility Specialist Phone: [phone]

## 2019-11-14 NOTE — Progress Notes (Addendum)
[facility]  Subjective  - Feeling better each day, still on O2 and working on IS.   Objective (!) 113/43 66 98.7 F (37.1 C) (Oral) 20 96%  Intake/Output Summary (Last 24 hours) at 11/14/2019 0736 Last data filed at 11/14/2019 0455 Gross per 24 hour  Intake 250 ml  Output 1450 ml  Net -1200 ml    Left LE with ankle wrap for minimal bleeding Groin soft, ABD placed over incision Leg incision with protective vac in place 250 cc op last 24 hours  Foot wound stable Doppler signals DP/PT with brisk peroneal left LE  Assessment/Planning: POD # 15  left below-knee pop bypass with vein:Her bypass graft is functioning well with brisk Doppler signals in the left foot.  Pneumonia requiring O2 [place] 3L SAT 93 % Decreased anxiety Vac changed yesterday, SS bloody drainage in canister Fluid edema 1200 urine OP, -15645, Cr WNL Ambulating improved WBC leukocytosis 13.8 11/14/19  Roxy Horseman 11/14/2019 7:36 AM --  Laboratory Lab Results: Recent Labs    11/12/19 1644 11/13/19 0338  WBC 14.5* 13.8*  HGB 9.0* 8.1*  HCT 29.5* 26.6*  PLT 210 204   BMET Recent Labs    11/12/19 0520 11/13/19 0338  NA 134* 133*  K 3.9 3.8  CL 93* 93*  CO2 32 33*  GLUCOSE 155* 127*  BUN 21 20  CREATININE 0.56 0.57  CALCIUM 8.6* 8.3*    COAG Lab Results  Component Value Date   INR 1.1 10/30/2019   INR 1.08 06/26/2017   INR 1.02 04/07/2017   No results found for: PTT  I agree with the above.  I have seen and evaluate the patient.  I will give her another dose of Lasix today.  We will try to arrange discharge tomorrow.  She may require home oxygen.  She will require wound VAC for protection of her incisions given the large amount of lymphatic drainage.  I am asking pulmonary to reevaluate her to help with her oxygen requirement.  [name] [name]

## 2019-11-14 NOTE — Progress Notes (Addendum)
   NAME:  [name] [name], MRN:  [id number], DOB:  02-03-1956, LOS: 32 ADMISSION DATE:  10/30/2019, CONSULTATION DATE:  11/03/2018 REFERRING MD:  Eudelia Bunch, CHIEF COMPLAINT:  SOB with progressive hypoxemia   Brief History   This is a 64 y.o. W F admitted for surgical management of a left leg ulcer and occlusion of a right femoral-popliteal bypass graft.  PCCM consulted on 8/15 for dyspnea, hypoxia in the setting of atrial fibrillation, heart failure, HAP  Past Medical History   has a past medical history of Abdominal abscess, Allergic rhinitis, Arthritis, Asthma, Bacteremia (03/2017), Carotid stenosis, left (04/2009), Colon polyps (12/05), COPD (chronic obstructive pulmonary disease) ([place]), Depression, Diabetes mellitus, Dyspnea, Folate deficiency, Hyperlipidemia, Hypertension, Lumbar disc disease, Obesity, morbid (more than 100 lbs over ideal weight or BMI > 40) ([place]), Peripheral vascular disease ([place]), Psoriasis, Thrombocytopenia ([place]), and Tobacco abuse.  She does acknowledge at least a 40 pk-yr tobacco hx and continues to smoke 1ppd. She is currently managed OPD with Flovent + albuterol.  Significant Hospital Events   8/11 Surgery 8/15 A fib with RVR, hypoxic resp failure. PCCM consult. Cefipime and vanc was initiated for possible HAP with WBC albeit elevated but showing no consistent trend from 8/11 thru 8/15. 8/26 PCCM re consulted for persistent hypoxia  Consults:  8/15 PCCM  Procedures:  As noted above Echocardiogram 8/16 abnormal septal motion.  LVEF 47% grade 2 diastolic dysfunction elevated end-diastolic pressure normal RV function Micro Data:  8/15: Sputum culture  Antimicrobials:    Interim history/subjective:  Improving. Working with PT.  Nearing d/c but remains on O2, PCCM consulted to assist.    Objective   Blood pressure (!) 113/47, pulse 60, temperature 97.9 F (36.6 C), temperature source Oral, resp. rate 15, height 5\' 7"  (1.702 m), weight 123.4 kg, SpO2 95 %.         Intake/Output Summary (Last 24 hours) at 11/14/2019 1434 Last data filed at 11/14/2019 0455 Gross per 24 hour  Intake 250 ml  Output 1450 ml  Net -1200 ml   Filed Weights   11/12/19 0500 11/13/19 0504 11/14/19 0430  Weight: 121.7 kg 124.2 kg 123.4 kg    Examination: General:  Pleasant female, NAD sitting OOB in chair  HEENT: MM pink/moist Neuro: awake, alert, appropriate, MAE  CV: s1s2, no m/r/g PULM:  resps even non labored on 3L [place], rare expiratory wheeze  GI: soft, bsx4 active  Extremities: warm/dry, 1+ BLE edema, LLE VAC dressing c/d  Skin: no rashes or lesions   Assessment & Plan:   Acute hypoxemic respiratory failure- overall improved but continues to require 2-3L [place]. Has been diuresed, NEG 15L this admit.  Desat today with PT to 87% on 3L with activity. Suspect multifactorial in setting mild volume overload, post op deconditioning and moderate underlying COPD (heavy smoker just quit 1 week pta).   Plan -Mobilize as able  -F/u CXR today  -Continue gentle diuresis as BP and Scr tol  -Pulmonary hygiene - IS, flutter valve  -Continue yupelri, Brovana and Pulmicort  -PRN albuterol  -No indication for further systemic steroids  -Continue attempts at weaning O2, however I suspect she may ultimately need to be discharged on home O2 with outpt pulmonary f/u, PFTs    PAD, post op status Plan Per vascular   Nickolas Madrid, NP Pulmonary/Critical Care Medicine  11/14/2019  2:34 PM

## 2019-11-15 DIAGNOSIS — R0602 Shortness of breath: Secondary | ICD-10-CM

## 2019-11-15 DIAGNOSIS — R0600 Dyspnea, unspecified: Secondary | ICD-10-CM

## 2019-11-15 LAB — GLUCOSE, CAPILLARY
Glucose-Capillary: 107 mg/dL — ABNORMAL HIGH (ref 70–99)
Glucose-Capillary: 127 mg/dL — ABNORMAL HIGH (ref 70–99)
Glucose-Capillary: 261 mg/dL — ABNORMAL HIGH (ref 70–99)
Glucose-Capillary: 123 mg/dL — ABNORMAL HIGH (ref 70–99)

## 2019-11-15 MED ORDER — SALINE SPRAY 0.65 % NA SOLN
1.0000 | NASAL | Status: DC | PRN
  Filled 2019-11-15: qty 44

## 2019-11-15 MED ORDER — FUROSEMIDE 10 MG/ML IJ SOLN
40.0000 mg | Freq: Two times a day (BID) | INTRAMUSCULAR | Status: DC
  Administered 2019-11-15 – 2019-11-16 (×4): 40 mg via INTRAVENOUS
  Filled 2019-11-15 (×4): qty 4

## 2019-11-15 NOTE — Progress Notes (Signed)
Physical Therapy Treatment Patient Details Name: [name] [name] MRN: [id number] DOB: May 04, 1955 Today's Date: 11/15/2019    History of Present Illness 64 y.o. female presenting with Lt leg ulcer, occlusion of R femoral-popliteal bypass graft s/p left femoral-popliteal BPG with groin wound VAC and Rt leg balloon angioplasty SFA . 8/14 pt with AFib with new RBBB, 8/15 progressive hypoxemia. PMHx significant for R femoral-popliteal bypass graft on 04/07/2017, L carotid endartectomy on 06/30/2017, COPD, depression, DM, HLD, HTN, obesity, and tobacco use.    PT Comments    Pt very pleasant and eager to return home. Pt required 3L of O2 at rest to maintain SpO2 >90% and 4L with gait. Pt educated for HEP and continued mobility.    Follow Up Recommendations  Home health PT;Supervision for mobility/OOB     Equipment Recommendations  Rolling walker with 5" wheels    Recommendations for Other Services       Precautions / Restrictions Precautions Precautions: Fall Precaution Comments: VAC, 4L fo gait Restrictions RLE Weight Bearing: Weight bearing as tolerated LLE Weight Bearing: Weight bearing as tolerated    Mobility  Bed Mobility               General bed mobility comments: in chair on arrival and end of session  Transfers Overall transfer level: Modified independent               General transfer comment: pt able to stand from recliner, pivot to bSC and back without assist  Ambulation/Gait Ambulation/Gait assistance: Supervision Gait Distance (Feet): 350 Feet Assistive device: Rolling walker (2 wheeled) Gait Pattern/deviations: Step-through pattern;Wide base of support;Decreased stride length   Gait velocity interpretation: >2.62 ft/sec, indicative of community ambulatory General Gait Details: pt with improved upright posture with gait with ability to maintain no talking and walking with only one brief standing rest break. cues for breathing technique and gait speed  with pt maintaining 90% on 4L with gait   Stairs             Wheelchair Mobility    Modified Rankin (Stroke Patients Only)       Balance Overall balance assessment: Mild deficits observed, not formally tested   Sitting balance-Leahy Scale: Good       Standing balance-Leahy Scale: Fair                              Cognition Arousal/Alertness: Awake/alert Behavior During Therapy: WFL for tasks assessed/performed Overall Cognitive Status: Within Functional Limits for tasks assessed                                        Exercises General Exercises - Lower Extremity Long Arc Quad: AROM;Both;Seated;20 reps Hip Flexion/Marching: AROM;Both;Seated;15 reps    General Comments        Pertinent Vitals/Pain Pain Assessment: No/denies pain    Home Living                      Prior Function            PT Goals (current goals can now be found in the care plan section) Progress towards PT goals: Progressing toward goals    Frequency    Min 3X/week      PT Plan Current plan remains appropriate    Co-evaluation  AM-PAC PT "6 Clicks" Mobility   Outcome Measure  Help needed turning from your back to your side while in a flat bed without using bedrails?: None Help needed moving from lying on your back to sitting on the side of a flat bed without using bedrails?: A Little Help needed moving to and from a bed to a chair (including a wheelchair)?: A Little Help needed standing up from a chair using your arms (e.g., wheelchair or bedside chair)?: A Little Help needed to walk in hospital room?: A Little Help needed climbing 3-5 steps with a railing? : A Little 6 Click Score: 19    End of Session Equipment Utilized During Treatment: Oxygen Activity Tolerance: Patient tolerated treatment well Patient left: in chair;with call bell/phone within reach Nurse Communication: Mobility status PT Visit Diagnosis:  Unsteadiness on feet (R26.81);Muscle weakness (generalized) (M62.81);Other abnormalities of gait and mobility (R26.89)     Time: 0990-6893 PT Time Calculation (min) (ACUTE ONLY): 25 min  Charges:  $Gait Training: 8-22 mins $Therapeutic Exercise: 8-22 mins                     Tristin Vandeusen P, PT Acute Rehabilitation Services Pager: [phone] Office: [place] 11/15/2019, 11:54 AM

## 2019-11-15 NOTE — Progress Notes (Signed)
Pt's sleeping at night with 3 LPM of O2 HFNCL, SPO2 83-88%. Increased O2 to 5 LPM, able to maintain SPO2 >  90-96 %, able to have strong and productive coughing with thick white clear sputum.  Auscultated negative wheezing or stridor, positive for rhonchi bilaterally. She's hemodynamically stable. Will continue to monitor.  Kennyth Lose, RN

## 2019-11-15 NOTE — Progress Notes (Signed)
NAME:  [name] [name], MRN:  [id number], DOB:  1955-09-16, LOS: 42 ADMISSION DATE:  10/30/2019, CONSULTATION DATE:  11/03/2018 REFERRING MD:  Eudelia Bunch, CHIEF COMPLAINT:  SOB with progressive hypoxemia   Brief History   This is a 64 y.o. W F admitted for surgical management of a left leg ulcer and occlusion of a right femoral-popliteal bypass graft.  PCCM consulted on 8/15 for dyspnea, hypoxia in the setting of atrial fibrillation, heart failure, HAP  Past Medical History   has a past medical history of Abdominal abscess, Allergic rhinitis, Arthritis, Asthma, Bacteremia (03/2017), Carotid stenosis, left (04/2009), Colon polyps (12/05), COPD (chronic obstructive pulmonary disease) ([place]), Depression, Diabetes mellitus, Dyspnea, Folate deficiency, Hyperlipidemia, Hypertension, Lumbar disc disease, Obesity, morbid (more than 100 lbs over ideal weight or BMI > 40) ([place]), Peripheral vascular disease ([place]), Psoriasis, Thrombocytopenia ([place]), and Tobacco abuse.  She does acknowledge at least a 40 pk-yr tobacco hx and continues to smoke 1ppd. She is currently managed OPD with Flovent + albuterol.  Significant Hospital Events   8/11 Surgery 8/15 A fib with RVR, hypoxic resp failure. PCCM consult. Cefipime and vanc was initiated for possible HAP with WBC albeit elevated but showing no consistent trend from 8/11 thru 8/15. 8/26 PCCM re consulted for persistent hypoxia  Consults:  8/15 PCCM  Procedures:  As noted above Echocardiogram 8/16 abnormal septal motion.  LVEF 29% grade 2 diastolic dysfunction elevated end-diastolic pressure normal RV function Micro Data:  8/15: Sputum culture  Antimicrobials:    Interim history/subjective:  Feels ready to go home  Objective   Blood pressure (Abnormal) 109/45, pulse 61, temperature 98 F (36.7 C), temperature source Oral, resp. rate 19, height 5\' 7"  (1.702 m), weight 124.3 kg, SpO2 93 %.        Intake/Output Summary (Last 24 hours) at 11/15/2019  1550 Last data filed at 11/15/2019 0700 Gross per 24 hour  Intake 250 ml  Output 1050 ml  Net -800 ml   Filed Weights   11/13/19 0504 11/14/19 0430 11/15/19 0500  Weight: 124.2 kg 123.4 kg 124.3 kg    Examination: General is a very pleasant white female sitting up in chair she is in no acute distress HEENT normocephalic atraumatic no jugular venous distention her phonation is hoarse, this is somewhat chronic but improved from when I saw her in the intensive care she does have fairly turbulent upper airway noises as well Pulmonary: Scattered rhonchi, improved with cough, mild basilar posterior rales. Currently on 4 L nasal cannula without accessory use or nasal flaring Cardiac: Regular rate and rhythm Abdomen: Soft nontender Extremities: Warm dry brisk cap refill dependent edema persists. Wound vacs are intact Neuro: Awake oriented no focal deficits.   Assessment & Plan:   Acute hypoxemic respiratory failure-working dx overload/pulm edema  (diuresed ~12 l neg) , +/- HAP and deconditioned superimposed on probable COPD H/o tobacco abuse Walking oximetry still needing 4lpm to keep sats > 90% Tolerating inc O2 Suspect there is a chronic component here.  PCXR she still has diffuse interstitial airspace disease but aeration is a little bit better; she has had a similar edema picture since admit -Report she did she does have episodes where the nursing staff have moving her up because her oxygen levels have dropped at night. She  has the body habitus for sleep apnea. If she is having significant episodes of desaturation at night this could certainly be contributing to her diastolic dysfunction Plan Agree w/ lasix  Cont to wean oxygen  pulm hygiene  Home on Yupelri brovana and pulmicort Has f/u with [place] in clinic: At which time we will repeat imaging, schedule pulmonary function testing. May be some benefit to considering high-resolution CT chest, which we would do as an outpatient, also given  what she is describing we may consider polysomnogram to rule out sleep apnea Would expect she will need to go home on oxygen   PAD, post op status Plan Per vascular    Erick Colace ACNP-BC [place] Pager # [phone] OR # [phone] if no answer

## 2019-11-15 NOTE — Progress Notes (Signed)
   Pending discharge:  1)Left ankle Measurement: 1cm x 1cm x 0.2cm  Dressing procedure/placement/frequency: Add enzymatic debridement to clean up wound bed now that blood flow restored. Top with alginate for hemostatic properties. Dry dressing topper with light compression with single layer of coban just around the ankle/over the wound.  2)Left thigh wounds Measurement: Proximal 5cm  Medial 7cm  Distal 9cm  Depth 0.2 cm depth  Wound bed: closed incisions, slight separation noted in the proximal and distal incisions; more noted in the most proximal incision  Drainage (amount, consistency, odor) serosanguinous in VAC canister  Periwound: edematous with bruising   Dressing procedure/placement/frequency: Mepitel used to cover the 3 closed incisions 1pc of black foam used to cover the medial and proximal incisions.  1pc of black foam used to cover the the distal incision along the medial calf.  HH PT/RN ordered, wound vac ordered Pending O2 needs for home, Pulmonary service will set up f/u and PFT's  Roxy Horseman PA-C

## 2019-11-15 NOTE — Progress Notes (Signed)
PT Cancellation Note  Patient Details Name: [name] [name] MRN: [id number] DOB: [birth year]   Cancelled Treatment:    Reason Eval/Treat Not Completed: Patient declined, no reason specified (just received breakfast and politely declined until after eating)   [name] 11/15/2019, 8:16 AM [name], PT Acute Rehabilitation Services Pager: [phone] Office: [phone]

## 2019-11-15 NOTE — Care Management (Addendum)
11-15-19 Case Manager reached out to Vascular PA-patient will need the [employer] wound VAC for home. Order form placed on the shadow chart for signature and then Case Manager will Fax to [employer] with [employer] to initiate the insurance authorization. Pt will need home health for RN, PT with F2F needs orders in Epic once stable to transition home. Case Manager will continue to follow for additional transition of care needs. Bethena Roys, RN,BSN Case Manager     1651 11-15-19 Order for wound [facility] faxed to [facility]- will have to await for insurance authorization. Case Manager contacted [place] will follow for home health needs. Orders for RN-wound vac changes and PT with F2F to be placed in Epic once stable to transition home. Case Manager will continue to follow for additional needs. Jamesetta Orleans, BSN Case Manager

## 2019-11-15 NOTE — Progress Notes (Signed)
SATURATION QUALIFICATIONS: (This note is used to comply with regulatory documentation for home oxygen)  Patient Saturations on Room Air at Rest = 83%  Patient Saturations on 3L at rest = 93%  Patient Saturations on 4 Liters of oxygen while Ambulating = 90%  Please briefly explain why patient needs home oxygen: Pt requires 3-4L at all times to maintain SpO2 >90% Evely Gainey P, PT [facility] Pager: [phone] Office: [phone]

## 2019-11-15 NOTE — Progress Notes (Signed)
° ° °  Subjective  -   No acute events   Physical Exam:  Vac in place to left leg Stable ulcers Doppler pulses   Assessment/Plan:    AFIB:  Continue Xarelto Pulm:  Appreciate CCM assistance.  COntinue nebs and IS.  Completed steroids.  Will give lasix BID to help with pulm edema.  Repeat CXR in am Heme:  rpeat CBC in am.  No active bleeding POssible d/c over weekend.  May need home O2  [name] 11/15/2019 7:57 AM --  Vitals:   11/15/19 0550 11/15/19 0744  BP: (!) 144/58 (!) 130/47  Pulse: 78 (!) 59  Resp: 16 18  Temp: 98.1 F (36.7 C) 97.9 F (36.6 C)  SpO2: 96% 100%    Intake/Output Summary (Last 24 hours) at 11/15/2019 0757 Last data filed at 11/15/2019 0700 Gross per 24 hour  Intake 250 ml  Output 1050 ml  Net -800 ml     Laboratory CBC    Component Value Date/Time   WBC 13.8 (H) 11/13/2019 0338   HGB 8.1 (L) 11/13/2019 0338   HCT 26.6 (L) 11/13/2019 0338   PLT 204 11/13/2019 0338    BMET    Component Value Date/Time   NA 133 (L) 11/13/2019 0338   K 3.8 11/13/2019 0338   CL 93 (L) 11/13/2019 0338   CO2 33 (H) 11/13/2019 0338   GLUCOSE 127 (H) 11/13/2019 0338   BUN 20 11/13/2019 0338   CREATININE 0.57 11/13/2019 0338   CREATININE 0.69 01/28/2013 1110   CALCIUM 8.3 (L) 11/13/2019 0338   GFRNONAA >60 11/13/2019 0338   GFRAA >60 11/13/2019 0338    COAG Lab Results  Component Value Date   INR 1.1 10/30/2019   INR 1.08 06/26/2017   INR 1.02 04/07/2017   No results found for: PTT  Antibiotics Anti-infectives (From admission, onward)   Start     Dose/Rate Route Frequency Ordered Stop   11/03/19 0800  vancomycin (VANCOCIN) IVPB 1000 mg/200 mL premix  Status:  Discontinued        1,000 mg 200 mL/hr over 60 Minutes Intravenous Every 12 hours 11/02/19 1828 11/04/19 0712   11/02/19 1830  vancomycin (VANCOREADY) IVPB 2000 mg/400 mL        2,000 mg 200 mL/hr over 120 Minutes Intravenous  Once 11/02/19 1828 11/03/19 2329   11/02/19 1830   ceFEPIme (MAXIPIME) 2 g in sodium chloride 0.9 % 100 mL IVPB  Status:  Discontinued        2 g 200 mL/hr over 30 Minutes Intravenous Every 8 hours 11/02/19 1828 11/05/19 0719   10/30/19 1900  vancomycin (VANCOREADY) IVPB 1500 mg/300 mL        1,500 mg 150 mL/hr over 120 Minutes Intravenous  Once 10/30/19 1606 10/30/19 2300   10/30/19 1600  vancomycin (VANCOREADY) IVPB 1500 mg/300 mL  Status:  Discontinued        1,500 mg 150 mL/hr over 120 Minutes Intravenous  Once 10/30/19 1524 10/30/19 1558   10/30/19 0651  vancomycin (VANCOCIN) IVPB 1000 mg/200 mL premix        1,000 mg 200 mL/hr over 60 Minutes Intravenous 60 min pre-op 10/30/19 0651 10/30/19 0955       V. Leia Alf, M.D., [facility] [place] Office: [phone] Pager:  [phone]

## 2019-11-15 NOTE — Progress Notes (Signed)
Mobility Specialist - Progress Note   11/15/19 1329  Mobility  Activity Ambulated in hall  Level of Assistance Modified independent, requires aide device or extra time  Assistive Device Front wheel walker  Distance Ambulated (ft) 220 ft (Intervals: 110 ft x 2)  Mobility Response Tolerated well  Mobility performed by Mobility specialist  $Mobility charge 1 Mobility    Pre-mobility: 56 HR, 96% SpO2 During mobility: 68 HR, 97% SpO2 Post-mobility: 58 HR, 94% SpO2  Pt stated she felt a bit lethargic as she had just eaten her lunch. She ambulated on 4 L/min of O2 via [place] and she was able to maintain her SpO2 >90% the entire time. Pt required one brief standing rest break at ~110 ft in order to catch her breath.  Pricilla Handler Mobility Specialist Mobility Specialist Phone: [phone]

## 2019-11-16 ENCOUNTER — Inpatient Hospital Stay (HOSPITAL_COMMUNITY): Payer: Medicare Other

## 2019-11-16 LAB — GLUCOSE, CAPILLARY
Glucose-Capillary: 110 mg/dL — ABNORMAL HIGH (ref 70–99)
Glucose-Capillary: 137 mg/dL — ABNORMAL HIGH (ref 70–99)
Glucose-Capillary: 105 mg/dL — ABNORMAL HIGH (ref 70–99)

## 2019-11-16 MED ORDER — OXYCODONE-ACETAMINOPHEN 5-325 MG PO TABS: 1.0000 | ORAL_TABLET | ORAL | 0 refills | Status: DC | PRN

## 2019-11-16 MED ORDER — ARFORMOTEROL TARTRATE 15 MCG/2ML IN NEBU: 15.0000 ug | INHALATION_SOLUTION | Freq: Two times a day (BID) | RESPIRATORY_TRACT | 1 refills | Status: DC

## 2019-11-16 MED ORDER — CLOPIDOGREL BISULFATE 75 MG PO TABS
75.0000 mg | ORAL_TABLET | Freq: Every day | ORAL | 2 refills | Status: DC
Start: 2019-11-16 — End: 2020-02-24

## 2019-11-16 MED ORDER — BUDESONIDE 0.5 MG/2ML IN SUSP
0.5000 mg | Freq: Two times a day (BID) | RESPIRATORY_TRACT | 12 refills | Status: DC
Start: 2019-11-16 — End: 2019-12-23

## 2019-11-16 MED ORDER — REVEFENACIN 175 MCG/3ML IN SOLN: 175.0000 ug | Freq: Every day | RESPIRATORY_TRACT | 1 refills | Status: DC

## 2019-11-16 MED ORDER — APIXABAN 5 MG PO TABS: 5.0000 mg | ORAL_TABLET | Freq: Two times a day (BID) | ORAL | 2 refills | Status: AC

## 2019-11-16 MED ORDER — FUROSEMIDE 10 MG/ML IJ SOLN
40.0000 mg | Freq: Two times a day (BID) | INTRAMUSCULAR | 0 refills | Status: DC
Start: 2019-11-16 — End: 2019-11-18
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
## 2019-11-16 NOTE — Progress Notes (Signed)
Mobility Specialist - Progress Note   11/16/19 1038  Mobility  Activity Refused mobility    Pt refused mobility due to some of her medications making her not feel well as well as being stressed about her d/c and insurance. States she is eager to continue walking while at home.    Pricilla Handler Mobility Specialist Mobility Specialist Phone: [phone]

## 2019-11-16 NOTE — Progress Notes (Signed)
D/c tele and midline. Went over AVS and all questions were answered. Pt going home with her KCI vac, oxygen tank , bsc and walker.   Lavenia Atlas, RN

## 2019-11-16 NOTE — Progress Notes (Addendum)
  Progress Note    11/16/2019 8:41 AM 17 Days Post-Op  Subjective: no complaints, eager to go home   Vitals:   11/16/19 0511 11/16/19 0801  BP: 136/60   Pulse: 60   Resp: 17   Temp: 98 F (36.7 C)   SpO2: 95% 92%   Physical Exam: Cardiac:  regular Lungs: 3L [place], non labored Incisions: left groin incision with some mild serous drainage- dry gauze applied. Left lower extremity incisions with Wound Vac in place. Good suction Extremities:  Stable ulcers. Bilateral legs well perfused. Doppler pulses Abdomen:  Obese, soft, non tender Neurologic: alert and oriented  CBC    Component Value Date/Time   WBC 13.8 (H) 11/13/2019 0338   RBC 3.05 (L) 11/13/2019 0338   HGB 8.1 (L) 11/13/2019 0338   HCT 26.6 (L) 11/13/2019 0338   PLT 204 11/13/2019 0338   MCV 87.2 11/13/2019 0338   MCH 26.6 11/13/2019 0338   MCHC 30.5 11/13/2019 0338   RDW 16.9 (H) 11/13/2019 0338   LYMPHSABS 1.7 11/02/2019 2239   MONOABS 1.5 (H) 11/02/2019 2239   EOSABS 0.1 11/02/2019 2239   BASOSABS 0.0 11/02/2019 2239    BMET    Component Value Date/Time   NA 133 (L) 11/13/2019 0338   K 3.8 11/13/2019 0338   CL 93 (L) 11/13/2019 0338   CO2 33 (H) 11/13/2019 0338   GLUCOSE 127 (H) 11/13/2019 0338   BUN 20 11/13/2019 0338   CREATININE 0.57 11/13/2019 0338   CREATININE 0.69 01/28/2013 1110   CALCIUM 8.3 (L) 11/13/2019 0338   GFRNONAA >60 11/13/2019 0338   GFRAA >60 11/13/2019 0338    INR    Component Value Date/Time   INR 1.1 10/30/2019 0651     Intake/Output Summary (Last 24 hours) at 11/16/2019 0841 Last data filed at 11/15/2019 0900 Gross per 24 hour  Intake --  Output 1050 ml  Net -1050 ml     Assessment/Plan:  64 y.o. female is s/p 17 Days Post-Op. Doing better overall. Still requiring O2 [place] 3L Sat 95%. CXR appears improved but with diffuse interstitial airspace disease. HH PT and RN for wound vac changes arranged. Home O2 ordered. She will go home on Yupelri, brovana and pulmicort and  lasix per pulmonology. She will d/c on Eliquis, Plavix and Aspirin. Stable for discharge home today. She has follow up with pulmonology on 9/9 and follow up with Dr. Trula Slade on 9/20   Karoline Caldwell, PA-C [facility] [phone] 11/16/2019 8:41 AM   I have independently interviewed and examined patient and agree with PA assessment and plan above. Strong desire for d/c this a.m. and she appears medically stable with need for home wound vac and O2.  Pasqualino Witherspoon C. Donzetta Matters, MD [facility] [place] Office: [phone] Pager: [phone]

## 2019-11-16 NOTE — TOC Transition Note (Signed)
Transition of Care [facility]) - CM/SW Discharge Note   Patient Details  Name: [name] [name] MRN: [id number] Date of Birth: 07/28/1955  Transition of Care [facility]) CM/SW Contact:  Claudie Leach, RN 11/16/2019, 3:29 PM   Clinical Narrative:    Patient to dc home with daughter and [facility] PT/RN.  HH was previously arranged with [facility]. Melissa is aware of patient d/c.  Wound vac will be delivered this afternoon by [employer].     [employer] will provide patient's O2, nebulizer, and nebulized medications.  Medications will be delivered tomorrow to home.  Patient states she has rescue inhalers to use between now and then.    Patient given Eliquis 30 day free card and [place] cards.  [place] does not process them but patient can use at retail pharmacy if needed.   Final next level of care: [place] Barriers to Discharge: No Barriers Identified   Patient Goals and CMS Choice Patient states their goals for this hospitalization and ongoing recovery are:: previously arranged   Choice offered to / list presented to : Patient   Discharge Plan and Services   Discharge Planning Services: CM Consult            DME Arranged: Negative pressure wound device, Oxygen, 3-N-1, Walker rolling DME Agency: [facility] Set[facility] Date DME Agency Contacted: 11/16/19   Representative spoke with at DME Agency: [name] @ Sherlon Handing at [place]: RN, PT [facility] Agency: [place] ([place]) Date [place]: 11/16/19 Time [place]: 1528 Representative spoke with at [place]: Lenna Sciara

## 2019-11-18 ENCOUNTER — Other Ambulatory Visit: Payer: Self-pay

## 2019-11-18 MED ORDER — FUROSEMIDE 40 MG PO TABS: 40.0000 mg | ORAL_TABLET | Freq: Two times a day (BID) | ORAL | 0 refills | Status: DC

## 2019-11-22 ENCOUNTER — Telehealth: Payer: Self-pay | Admitting: *Deleted

## 2019-11-22 NOTE — Telephone Encounter (Signed)
Received call from [place], stating patient was passing large bloodclots when blowing her nose then nose began actively bleeding. This had occurred Thursday evening, 11/21/18/21. I spoke to patient this AM. Patient states her nose stopped bleeding last night. She states she was discharged from hospital 10/30/2019 on Eliquis, Plavix and Aspirin.  Patient discontinued taking aspirin last night.  I discussed this with Dr Donzetta Matters, the provider in the office today.  Patient is to call or to report to the ED if nose bleed resumes.  Patient voiced understanding of the instructions.

## 2019-11-24 ENCOUNTER — Other Ambulatory Visit: Payer: Self-pay

## 2019-11-24 ENCOUNTER — Encounter (HOSPITAL_COMMUNITY): Payer: Self-pay | Admitting: Emergency Medicine

## 2019-11-24 ENCOUNTER — Emergency Department (HOSPITAL_COMMUNITY)
Admission: EM | Admit: 2019-11-24 | Discharge: 2019-11-24 | Disposition: A | Discharge status: Home / Self Care | Payer: Medicare Other | Attending: Emergency Medicine | Admitting: Emergency Medicine

## 2019-11-24 DIAGNOSIS — E1151 Type 2 diabetes mellitus with diabetic peripheral angiopathy without gangrene: Secondary | ICD-10-CM | POA: Diagnosis not present

## 2019-11-24 DIAGNOSIS — R5082 Postprocedural fever: Secondary | ICD-10-CM | POA: Diagnosis not present

## 2019-11-24 DIAGNOSIS — J4520 Mild intermittent asthma, uncomplicated: Secondary | ICD-10-CM | POA: Insufficient documentation

## 2019-11-24 DIAGNOSIS — J441 Chronic obstructive pulmonary disease with (acute) exacerbation: Secondary | ICD-10-CM | POA: Diagnosis not present

## 2019-11-24 DIAGNOSIS — Z794 Long term (current) use of insulin: Secondary | ICD-10-CM | POA: Insufficient documentation

## 2019-11-24 DIAGNOSIS — F17210 Nicotine dependence, cigarettes, uncomplicated: Secondary | ICD-10-CM | POA: Insufficient documentation

## 2019-11-24 DIAGNOSIS — Z79899 Other long term (current) drug therapy: Secondary | ICD-10-CM | POA: Diagnosis not present

## 2019-11-24 DIAGNOSIS — I10 Essential (primary) hypertension: Secondary | ICD-10-CM | POA: Diagnosis not present

## 2019-11-24 DIAGNOSIS — Z7901 Long term (current) use of anticoagulants: Secondary | ICD-10-CM | POA: Diagnosis not present

## 2019-11-24 LAB — CBC WITH DIFFERENTIAL/PLATELET
Abs Immature Granulocytes: 0.05 10*3/uL (ref 0.00–0.07)
Basophils Absolute: 0 10*3/uL (ref 0.0–0.1)
Basophils Relative: 0 %
Eosinophils Absolute: 0 10*3/uL (ref 0.0–0.5)
Eosinophils Relative: 0 %
HCT: 37.9 % (ref 36.0–46.0)
Hemoglobin: 11.1 g/dL — ABNORMAL LOW (ref 12.0–15.0)
Immature Granulocytes: 0 %
Lymphocytes Relative: 6 %
Lymphs Abs: 0.7 10*3/uL (ref 0.7–4.0)
MCH: 24.8 pg — ABNORMAL LOW (ref 26.0–34.0)
MCHC: 29.3 g/dL — ABNORMAL LOW (ref 30.0–36.0)
MCV: 84.8 fL (ref 80.0–100.0)
Monocytes Absolute: 1.2 10*3/uL — ABNORMAL HIGH (ref 0.1–1.0)
Monocytes Relative: 10 %
Neutro Abs: 10.1 10*3/uL — ABNORMAL HIGH (ref 1.7–7.7)
Neutrophils Relative %: 84 %
Platelets: 211 10*3/uL (ref 150–400)
RBC: 4.47 MIL/uL (ref 3.87–5.11)
RDW: 16 % — ABNORMAL HIGH (ref 11.5–15.5)
WBC: 12.1 10*3/uL — ABNORMAL HIGH (ref 4.0–10.5)
nRBC: 0 % (ref 0.0–0.2)

## 2019-11-24 LAB — COMPREHENSIVE METABOLIC PANEL
ALT: 19 U/L (ref 0–44)
AST: 21 U/L (ref 15–41)
Albumin: 3.2 g/dL — ABNORMAL LOW (ref 3.5–5.0)
Alkaline Phosphatase: 93 U/L (ref 38–126)
Anion gap: 9 (ref 5–15)
BUN: 12 mg/dL (ref 8–23)
CO2: 31 mmol/L (ref 22–32)
Calcium: 10 mg/dL (ref 8.9–10.3)
Chloride: 89 mmol/L — ABNORMAL LOW (ref 98–111)
Creatinine, Ser: 0.67 mg/dL (ref 0.44–1.00)
GFR calc Af Amer: >60 mL/min (ref >60)
GFR calc non Af Amer: >60 mL/min (ref >60)
Glucose, Bld: 205 mg/dL — ABNORMAL HIGH (ref 70–99)
Potassium: 4.6 mmol/L (ref 3.5–5.1)
Sodium: 129 mmol/L — ABNORMAL LOW (ref 135–145)
Total Bilirubin: 0.7 mg/dL (ref 0.3–1.2)
Total Protein: 6.9 g/dL (ref 6.5–8.1)

## 2019-11-24 LAB — LACTIC ACID, PLASMA: Lactic Acid, Venous: 1.1 mmol/L (ref 0.5–1.9)

## 2019-11-24 MED ORDER — LEVOFLOXACIN 500 MG PO TABS: 500.0000 mg | ORAL_TABLET | Freq: Every day | ORAL | 0 refills | Status: DC

## 2019-11-24 NOTE — Discharge Instructions (Signed)
Turn for any severe or worsening symptoms, take the antibiotics as prescribed by Dr. Campbell Lerner, 1 tablet daily

## 2019-11-24 NOTE — Consult Note (Signed)
Patient is a 64 year old female who is status post left femoral to below-knee popliteal bypass with vein October 30, 2019.  She was running a fever of about 100 today.  She noticed increased drainage from her left groin.  She also has some redness around the incisions in her left leg.  She currently has an incisional VAC which runs from the length of her high thigh down to below the knee.  There is some erythema around this.  She states that the wounds on her left foot are slowly healing.  Physical exam:  Left lower extremity to open wounds in the left groin incision each about a centimeter in diameter which when probed with a Q-tip penetrate about 2 cm.  There is serous drainage coming from this.  Left lower extremity incisions otherwise are intact with no areas of drainage there is some periincisional erythema that seems to be more related to the [facility] than actually to the incisions.  The left foot has a healing left lateral malleolus ulcer and dry gangrenous left second toe.  There is some induration of the high inner saphenectomy incision in the thigh.  There is no drainage from this.  It is not fluctuant.  Assessment: Poorly healing left groin wound, incisions left leg otherwise appear intact, possible early infection of saphenectomy left high thigh but may just represent hematoma  Patent bypass left leg  Plan: Patient will be started on local wound care in the groin normal saline wet-to-dry to the 2 open areas in the left groin.  We will discontinue the VAC at this point.  She was started on Levaquin 500 mg once a day for 14-day course.  We will arrange for follow-up for her later this week to recheck the wounds in her left leg.  She will call me if she has increasing drainage or increasing fevers if that occurs she will most likely need admission for IV antibiotics.  [name], MD [facility] [place] Office: [phone]

## 2019-11-24 NOTE — ED Triage Notes (Signed)
Pt to triage via [facility].  She has a wound vac to L upper leg.  Reports not feeling well with fever and chills since last night.
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
## 2019-11-24 NOTE — ED Notes (Signed)
Patient verbalizes understanding of discharge instructions. Opportunity for questioning and answers were provided. Armband removed by staff, pt discharged from ED to home via [place] with son. Wound covered with abd pad and wrap gauze.

## 2019-11-24 NOTE — ED Notes (Signed)
Dr. Oneida Alar came to waiting area to see pt.  Pt placed in room 1 and Dr. Oneida Alar states he put in discharge instructions and a Rx for antibiotics.  Dr. Sabra Heck and Marye Round, RN notified.

## 2019-11-25 ENCOUNTER — Telehealth: Payer: Self-pay | Admitting: Vascular Surgery

## 2019-11-25 MED ORDER — CEPHALEXIN 500 MG PO CAPS: 500.0000 mg | ORAL_CAPSULE | Freq: Three times a day (TID) | ORAL | 0 refills | Status: DC

## 2019-11-25 NOTE — Telephone Encounter (Signed)
Patient started on Levaquin yesterday for left groin drainage.  She called me today stating that she had developed a rash from this.  She has a penicillin allergy listed but thinks that she can take Keflex.  I called her in a 2-week supply of Keflex 500 mg 3 times daily.  She will call if she has further problems.  [name], MD [facility] Office: [phone]

## 2019-11-26 ENCOUNTER — Telehealth: Payer: Self-pay

## 2019-11-26 NOTE — Telephone Encounter (Signed)
I spoke to [place] at [place] and relayed Dr. Nona Dell message.  Also, I spoke to the patient today to remind her to make an appointment with a PA sometime this week.  She is having transportation issues but will try to come in next week if she is able.  Thurston Hole., LPN

## 2019-11-26 NOTE — Telephone Encounter (Signed)
-----   Message from Verlon Setting sent at 11/26/2019 10:21 AM EDT ----- Regarding: Juanda Bond  ----- Message ----- From: Elam Dutch, MD Sent: 11/24/2019   7:09 PM EDT To: [facility] Admin Pool, #   Patient was seen in the ER the evening of September 5.  I discontinued the VAC which runs from her high thigh down to her mid calf.  Please inform her home health nurse of this.  New wound care orders need to be normal saline wet-to-dry to the left open groin wound.  She was also started on Levaquin 500 mg once a day for 14-day course.  She needs to be seen in the office in the [facility] clinic probably Thursday or Friday of this week.  [name], MD [facility] [place] Office: [phone]

## 2019-11-28 ENCOUNTER — Telehealth: Payer: Self-pay

## 2019-11-28 ENCOUNTER — Inpatient Hospital Stay: Payer: Medicare Other | Admitting: Primary Care

## 2019-11-28 NOTE — Telephone Encounter (Signed)
Sharyn Lull from [facility] called requesting temporary discontinuation orders for physical therapy until patient's wound heal a little more.  Pt is walking "ok" but excess exercise may cause friction on the inner leg and groin, causing the incisions to open up.  They are healing well right now.  Order was given to discontinue for a few weeks and then restart.  Thurston Hole., LPN

## 2019-12-09 ENCOUNTER — Other Ambulatory Visit: Payer: Self-pay | Admitting: *Deleted

## 2019-12-09 ENCOUNTER — Ambulatory Visit (INDEPENDENT_AMBULATORY_CARE_PROVIDER_SITE_OTHER): Payer: Self-pay | Admitting: Physician Assistant

## 2019-12-09 ENCOUNTER — Encounter: Payer: Medicare Other | Admitting: Surgery

## 2019-12-09 ENCOUNTER — Other Ambulatory Visit (HOSPITAL_COMMUNITY): Payer: Medicare Other

## 2019-12-09 ENCOUNTER — Other Ambulatory Visit: Payer: Self-pay

## 2019-12-09 VITALS — BP 127/66 | HR 73 | Temp 98.6°F | Resp 20 | Ht 67.0 in | Wt 250.2 lb

## 2019-12-09 DIAGNOSIS — I739 Peripheral vascular disease, unspecified: Secondary | ICD-10-CM

## 2019-12-09 DIAGNOSIS — J9601 Acute respiratory failure with hypoxia: Secondary | ICD-10-CM

## 2019-12-09 DIAGNOSIS — I7025 Atherosclerosis of native arteries of other extremities with ulceration: Secondary | ICD-10-CM

## 2019-12-09 DIAGNOSIS — I6521 Occlusion and stenosis of right carotid artery: Secondary | ICD-10-CM

## 2019-12-09 NOTE — Progress Notes (Addendum)
POST OPERATIVE OFFICE NOTE    CC:  F/u for surgery  HPI:  This is a 64 y.o. female who is s/p Left femoral to below-knee popliteal artery bypass with ipsilateral nonreversed saphenous vein, Left external iliac, common femoral, profundofemoral endarterectomy, Right lower extremity angiogram, Balloon angioplasty, right femoral-popliteal bypass, Left groin Prevena wound VAC performed on 10/30/19 by Dr. Trula Slade for left lower extremity ulcerations. Her post operative course was complicated by afib with RVR, anemia requiring transfusion, RLL pneumonia with increased respiratory distress. She additionally has been having ongoing issues with her left groin wound dehiscence as well as left thigh incisions with some dehiscence. She was just recently in the ER on 11/24/19 because of increased drainage. She has been receiving HH for wound dressing changes and PT. She was initially started on Levaquin for 14 days by Dr. Oneida Alar however she developed a rash secondary to her penicillin allergy. She was changed to Keflex TID for 14 days. She will finish her last dose today. She does have some drainage from her groin and thigh incisions but they are slowly improving. HH has been assisting her with wet to dry dressings since her ER visit as advised by Dr.Fields. Otherwise, she says overall her pain is minimal. She will get a pulling sensation in area of incisions. She is ambulating some but PT has been on hold since her wounds are still healing. She otherwise denies any fever or chills.  She denies any new neurological symptoms. No amaurosis, no slurred speech, no weakness or numbness of upper or lower extremities.  She was found on her 10/28/19 visit with Dr. Trula Slade to have 80% right ICA stenosis which he discussed addressing after her left leg heals. She has hx of Left CEA 06/2017 for asymptomatic stenosis  Allergies  Allergen Reactions   Iohexol Rash    Pt states she had a rash 35-40 yrs ago during a procedure.   Benadryl was given and pt was fine in 30 mins. She has had multiple CT's since with premeds and has done fine.  No anaphylaxis per pt. I updated this record. [name], RTRCT  03/06/17   Penicillins Rash and Other (See Comments)    PATIENT HAS HAD A PCN REACTION WITH IMMEDIATE RASH, FACIAL/TONGUE/THROAT SWELLING, SOB, OR LIGHTHEADEDNESS WITH HYPOTENSION:  #  #  YES  #  #  Has patient had a PCN reaction causing severe rash involving mucus membranes or skin necrosis: No Has patient had a PCN reaction that required hospitalization: No Has patient had a PCN reaction occurring within the last 10 years: No Can take amoxicillin      Levaquin [Levofloxacin] Hives    rash   Morphine And Related Nausea And Vomiting    Makes her crawl out of her skin   Sulfa Antibiotics Rash    Current Outpatient Medications  Medication Sig Dispense Refill   albuterol (PROVENTIL HFA;VENTOLIN HFA) 108 (90 BASE) MCG/ACT inhaler Inhale 2 puffs into the lungs every 6 (six) hours as needed for wheezing.     apixaban (ELIQUIS) 5 MG TABS tablet Take 1 tablet (5 mg total) by mouth 2 (two) times daily. 60 tablet 2   arformoterol (BROVANA) 15 MCG/2ML NEBU Take 2 mLs (15 mcg total) by nebulization 2 (two) times daily. 120 mL 1   Ascorbic Acid (VITAMIN C) 1000 MG tablet Take 1,000 mg by mouth daily.     atorvastatin (LIPITOR) 20 MG tablet Take 1 tablet (20 mg total) by mouth at bedtime. (Patient taking differently:  Take 20 mg by mouth daily. ) 90 tablet 3   budesonide (PULMICORT) 0.5 MG/2ML nebulizer solution Take 2 mLs (0.5 mg total) by nebulization 2 (two) times daily. 30 mL 12   cephALEXin (KEFLEX) 500 MG capsule Take 1 capsule (500 mg total) by mouth 3 (three) times daily. 42 capsule 0   Cholecalciferol (VITAMIN D3) 1000 UNITS CAPS Take 1,000 Units by mouth 2 (two) times daily.      clobetasol ointment (TEMOVATE) 0.93 % Apply 1 application topically 2 (two) times daily as needed (psoriasis).   5   clopidogrel  (PLAVIX) 75 MG tablet Take 1 tablet (75 mg total) by mouth daily. 30 tablet 2   FLOVENT HFA 220 MCG/ACT inhaler Inhale 1 puff into the lungs in the morning and at bedtime.      gabapentin (NEURONTIN) 300 MG capsule Take 1 capsule (300 mg total) by mouth 3 (three) times daily. 90 capsule 1   hydrochlorothiazide (HYDRODIURIL) 25 MG tablet TAKE 1 TABLET DAILY (Patient taking differently: Take 25 mg by mouth daily. ) 30 tablet 2   insulin NPH-regular Human (NOVOLIN 70/30) (70-30) 100 UNIT/ML injection Inject 75 Units into the skin 2 (two) times daily.      lisinopril (ZESTRIL) 10 MG tablet Take 10 mg by mouth 2 (two) times daily.     Multiple Vitamin (MULTIVITAMIN WITH MINERALS) TABS tablet Take 1 tablet by mouth daily.     Multiple Vitamins-Minerals (ZINC PO) Take 1 tablet by mouth daily.     naproxen sodium (ALEVE) 220 MG tablet Take 220 mg by mouth 2 (two) times daily as needed (pain/headaches).     oxyCODONE-acetaminophen (PERCOCET/ROXICET) 5-325 MG tablet Take 1-2 tablets by mouth every 4 (four) hours as needed for moderate pain. 30 tablet 0   Probiotic CAPS Take 1 capsule by mouth daily.     revefenacin (YUPELRI) 175 MCG/3ML nebulizer solution Take 3 mLs (175 mcg total) by nebulization daily. 90 mL 1   sertraline (ZOLOFT) 100 MG tablet Take 100 mg by mouth once daily (Patient taking differently: Take 100 mg by mouth daily. ) 180 tablet 1   No current facility-administered medications for this visit.     ROS:  See HPI  Physical Exam:  Vitals:   12/09/19 0848  BP: 127/66  Pulse: 73  Resp: 20  Temp: 98.6 F (37 C)  TempSrc: Temporal  SpO2: 98%  Weight: 250 lb 3.2 oz (113.5 kg)  Height: 5\' 7"  (1.702 m)   General: well appearing, well nourished female, not in any distress Cardiac: regular rate and rhythm. Right carotid bruit Pulmonary: non labored respirations, on 1L 02 Incision:        Some mild serous drainage from groin and thigh incisions, otherwise healing  well. The proximal/lateral aspect of the groin incision with 1.5 cm x 1 cm deep opening. Pink granulation tissue. No signs of infection. Used cotton tipped applicator to probe wound to see if it tract and there is no tunneling. No exposed graft. The proximal portion of the popliteal incision is open with serous drainage. Wet to dry dressings applied to groin and dry dressings applied to rest of leg incisions Extremities:  2+ femoral pulses bilaterally, 2+ Dp and PT pulses bilaterally. Bilateral feet warm and well perfused. Motor and sensation intact. Left foot with stable dry eschars over 1st MTP joint and distal 2nd toe. She also has lateral 1.5 cm ulceration of malleolus that is healing well  Neuro: alert and oriented. Neuro intact. No focal weakness  or paresthesias Abdomen: obese, soft  Assessment/Plan:  This is a 64 y.o. female who is s/p Left femoral to below-knee popliteal artery bypass with ipsilateral nonreversed saphenous vein, Left external iliac, common femoral, profundofemoral endarterectomy, Right lower extremity angiogram, Balloon angioplasty, right femoral-popliteal bypass, Left groin Prevena wound VAC performed on 10/30/19 by Dr. Trula Slade. She is now without the Prevena wound VAC and is just continuing wet to dry dressing changes. Her wounds are healing well. She does have serous drainage but no signs of infection. She will complete her course of Keflex today. I do not think she needs any extended antibiotic course at this time. Her lower extremities are well perfused and warm with palpable pulses.Will continue to allow her left second toe to demarcate. I will have her keep close follow up to assess her wound healing. She will have her post op ABI and bilateral lower extremity bypass graft duplex at her next visit in 1-2 weeks. She can be seen in [facility] or by Dr. Trula Slade. Her right Carotid intervention will need to be arranged once her left leg has healed. She will call for earlier follow up  if she develops any worsening symptoms or signs of infection .    Karoline Caldwell, PA-C [facility] [phone]  Clinic MD:  Dr. Trula Slade

## 2019-12-10 ENCOUNTER — Other Ambulatory Visit: Payer: Self-pay | Admitting: *Deleted

## 2019-12-10 DIAGNOSIS — I7025 Atherosclerosis of native arteries of other extremities with ulceration: Secondary | ICD-10-CM

## 2019-12-10 DIAGNOSIS — I739 Peripheral vascular disease, unspecified: Secondary | ICD-10-CM

## 2019-12-23 ENCOUNTER — Ambulatory Visit (HOSPITAL_COMMUNITY)
Admission: RE | Admit: 2019-12-23 | Discharge: 2019-12-23 | Disposition: A | Discharge status: Home / Self Care | Payer: Medicare Other | Source: Ambulatory Visit | Attending: Surgery | Admitting: Surgery

## 2019-12-23 ENCOUNTER — Ambulatory Visit (INDEPENDENT_AMBULATORY_CARE_PROVIDER_SITE_OTHER): Payer: Self-pay | Admitting: Physician Assistant

## 2019-12-23 ENCOUNTER — Other Ambulatory Visit: Payer: Self-pay

## 2019-12-23 ENCOUNTER — Ambulatory Visit (INDEPENDENT_AMBULATORY_CARE_PROVIDER_SITE_OTHER)
Admission: RE | Admit: 2019-12-23 | Discharge: 2019-12-23 | Disposition: A | Discharge status: Home / Self Care | Payer: Medicare Other | Source: Ambulatory Visit | Attending: Surgery | Admitting: Surgery

## 2019-12-23 VITALS — BP 150/74 | HR 77 | Temp 98.0°F | Resp 20 | Ht 67.0 in | Wt 245.5 lb

## 2019-12-23 DIAGNOSIS — I739 Peripheral vascular disease, unspecified: Secondary | ICD-10-CM | POA: Diagnosis not present

## 2019-12-23 DIAGNOSIS — I7025 Atherosclerosis of native arteries of other extremities with ulceration: Secondary | ICD-10-CM

## 2019-12-23 DIAGNOSIS — I6523 Occlusion and stenosis of bilateral carotid arteries: Secondary | ICD-10-CM

## 2019-12-23 MED ORDER — AMOXICILLIN 500 MG PO TABS: 500.0000 mg | ORAL_TABLET | Freq: Two times a day (BID) | ORAL | 0 refills | Status: AC

## 2019-12-23 NOTE — Progress Notes (Signed)
POST OPERATIVE OFFICE NOTE    CC:  F/u for surgery  HPI:  This is a 64 y.o. female who is s/p Left femoral to below-knee popliteal artery bypass with ipsilateral nonreversed saphenous vein, Left external iliac, common femoral, profundofemoral endarterectomy, Right lower extremity angiogram, Balloon angioplasty, right femoral-popliteal bypass, Left groin Prevena wound VAC performed on 10/30/19 by Dr. Trula Slade for left lower extremity ulcerations. She was seen on 12/09/19. She was doing well at that time just with some wound dehiscence of the left lower leg incisions. Her Prevena wound VAC was off at time of her follow up. She had completed a course of Keflex per Dr. Oneida Alar. HH has been assisting her with wet to dry dressings changes. She was instructed to follow up in 1-2 weeks for wound check  She is here today for her 1-2 week follow up visit and non invasive studies. Overall her pain has been minimal. She will get a pulling sensation in area of incisions and they are somewhat tender if pressed on. She is ambulating some but PT has been on hold since her wounds are still healing.She does have some drainage still from her groin but slowly improving. She states on Saturday that she noticed a "pea sized" area that busted open with puss from her lower leg incision. Since then the area has become increasingly red and swollen and warm to the touch. It has been draining serous drainage since the initial purulence. She denies any fever or chills.   She denies any new neurological symptoms. No amaurosis, no slurred speech, no weakness or numbness of upper or lower extremities.  She was found on her 10/28/19 visit with Dr. Trula Slade to have 80% right ICA stenosis which he discussed addressing after her left leg heals. She has hx of Left CEA 06/2017 for asymptomatic stenosis  Allergies  Allergen Reactions  . Iohexol Rash    Pt states she had a rash 35-40 yrs ago during a procedure.  Benadryl was given and pt was fine  in 30 mins. She has had multiple CT's since with premeds and has done fine.  No anaphylaxis per pt. I updated this record. [name], RTRCT  03/06/17  . Penicillins Rash and Other (See Comments)    PATIENT HAS HAD A PCN REACTION WITH IMMEDIATE RASH, FACIAL/TONGUE/THROAT SWELLING, SOB, OR LIGHTHEADEDNESS WITH HYPOTENSION:  #  #  YES  #  #  Has patient had a PCN reaction causing severe rash involving mucus membranes or skin necrosis: No Has patient had a PCN reaction that required hospitalization: No Has patient had a PCN reaction occurring within the last 10 years: No Can take amoxicillin     . Levaquin [Levofloxacin] Hives    rash  . Morphine And Related Nausea And Vomiting    Makes her crawl out of her skin  . Sulfa Antibiotics Rash    Current Outpatient Medications  Medication Sig Dispense Refill  . albuterol (PROVENTIL HFA;VENTOLIN HFA) 108 (90 BASE) MCG/ACT inhaler Inhale 2 puffs into the lungs every 6 (six) hours as needed for wheezing.    Marland Kitchen apixaban (ELIQUIS) 5 MG TABS tablet Take 1 tablet (5 mg total) by mouth 2 (two) times daily. 60 tablet 2  . arformoterol (BROVANA) 15 MCG/2ML NEBU Take 2 mLs (15 mcg total) by nebulization 2 (two) times daily. 120 mL 1  . Ascorbic Acid (VITAMIN C) 1000 MG tablet Take 1,000 mg by mouth daily.    Marland Kitchen atorvastatin (LIPITOR) 20 MG tablet Take 1 tablet (  20 mg total) by mouth at bedtime. (Patient taking differently: Take 20 mg by mouth daily. ) 90 tablet 3  . Cholecalciferol (VITAMIN D3) 1000 UNITS CAPS Take 1,000 Units by mouth 2 (two) times daily.     . clobetasol ointment (TEMOVATE) 5.27 % Apply 1 application topically 2 (two) times daily as needed (psoriasis).   5  . clopidogrel (PLAVIX) 75 MG tablet Take 1 tablet (75 mg total) by mouth daily. 30 tablet 2  . gabapentin (NEURONTIN) 300 MG capsule Take 1 capsule (300 mg total) by mouth 3 (three) times daily. 90 capsule 1  . hydrochlorothiazide (HYDRODIURIL) 25 MG tablet TAKE 1 TABLET DAILY (Patient  taking differently: Take 25 mg by mouth daily. ) 30 tablet 2  . insulin NPH-regular Human (NOVOLIN 70/30) (70-30) 100 UNIT/ML injection Inject 75 Units into the skin 2 (two) times daily.     Marland Kitchen lisinopril (ZESTRIL) 10 MG tablet Take 10 mg by mouth 2 (two) times daily.    . Multiple Vitamin (MULTIVITAMIN WITH MINERALS) TABS tablet Take 1 tablet by mouth daily.    . Multiple Vitamins-Minerals (ZINC PO) Take 1 tablet by mouth daily.    . naproxen sodium (ALEVE) 220 MG tablet Take 220 mg by mouth 2 (two) times daily as needed (pain/headaches).    . oxyCODONE-acetaminophen (PERCOCET/ROXICET) 5-325 MG tablet Take 1-2 tablets by mouth every 4 (four) hours as needed for moderate pain. 30 tablet 0  . Probiotic CAPS Take 1 capsule by mouth daily.    . revefenacin (YUPELRI) 175 MCG/3ML nebulizer solution Take 3 mLs (175 mcg total) by nebulization daily. 90 mL 1  . sertraline (ZOLOFT) 100 MG tablet Take 100 mg by mouth once daily (Patient taking differently: Take 100 mg by mouth daily. ) 180 tablet 1   No current facility-administered medications for this visit.     ROS:  See HPI  Physical Exam:  Vitals:   12/23/19 1133  BP: (!) 150/74  Pulse: 77  Resp: 20  Temp: 98 F (36.7 C)  TempSrc: Temporal  SpO2: 100%  Weight: 245 lb 8 oz (111.4 kg)  Height: 5\' 7"  (1.702 m)    General: well appearing, well nourished, not in distress Extremities:  2+ femoral pulses bilaterally, bilateral lower extremities well perfused and warm. Left groin healing well. Small area of opening in the left groin. No drainage. No surrounding erythema    Left lower leg at popliteal incision proximal incision line with small area of opening. Pink granulation tissue. No surrounding erythema. No drainage. Wet to dry dressing applied. The distal aspect of incision with abscess present. Cotton tipped applicator went about 4 cm in superficially. No other tunneling. Blood and serous drainage. Packed with one 4 x 4 gauze. Dry  dressings applied Neuro: alert and oriented Abdomen: obese  Assessment/Plan:  This is a 65 y.o. female who is s/p Left femoral to below-knee popliteal artery bypass with ipsilateral nonreversed saphenous vein, Left external iliac, common femoral, profundofemoral endarterectomy, Right lower extremity angiogram, Balloon angioplasty of the right femoral-popliteal bypass, Left groin Prevena wound VAC performed on 10/30/19 by Dr. Trula Slade. She is here due to dehiscence of left lower extremity incisions. She now has abscess in lower aspect of popliteal incision. Dr. Trula Slade came and evaluated patient as well. Abscess was probed and packed with 4 x 4 gauze. Patient already has [facility] RN. She will need twice daily packing changes. - Amoxicillin sent to patients pharmacy 500 mg BID x 7 days - Patient will follow up  in 2 days. If no improvement she will need debridement in the [place], PA-C [facility] [phone]  Clinic MD:  Dr. Trula Slade

## 2019-12-26 ENCOUNTER — Ambulatory Visit (INDEPENDENT_AMBULATORY_CARE_PROVIDER_SITE_OTHER): Payer: Self-pay | Admitting: Physician Assistant

## 2019-12-26 ENCOUNTER — Other Ambulatory Visit: Payer: Self-pay

## 2019-12-26 VITALS — BP 159/73 | HR 79 | Temp 97.9°F | Resp 20 | Ht 67.0 in | Wt 247.6 lb

## 2019-12-26 DIAGNOSIS — T8131XD Disruption of external operation (surgical) wound, not elsewhere classified, subsequent encounter: Secondary | ICD-10-CM

## 2019-12-26 NOTE — Progress Notes (Signed)
POST OPERATIVE OFFICE NOTE    CC:  F/u for surgery  HPI:  This is a 64 y.o. female who is s/p Left femoral to below-knee popliteal artery bypass with ipsilateral nonreversed saphenous vein,Left external iliac, common femoral, profundofemoral endarterectomy, Balloon angioplasty, right femoral-popliteal bypass.     on 10/30/2019 by Dr. Trula Slade.    She was seen a week ago she continues to has slow healing incisions to include left groin and an abscess formation at the distal BK popliteal incision that required drainage in the office.  She was afebrile without chills.  The wound was packed with 4 x 4 gauze.  She was placed on empiric antibiotics using Amoxicillin 500 mg for 7 days.    Pt returns today for follow up and wound check.    Allergies  Allergen Reactions  . Iohexol Rash    Pt states she had a rash 35-40 yrs ago during a procedure.  Benadryl was given and pt was fine in 30 mins. She has had multiple CT's since with premeds and has done fine.  No anaphylaxis per pt. I updated this record. [name], RTRCT  03/06/17  . Penicillins Rash and Other (See Comments)    PATIENT HAS HAD A PCN REACTION WITH IMMEDIATE RASH, FACIAL/TONGUE/THROAT SWELLING, SOB, OR LIGHTHEADEDNESS WITH HYPOTENSION:  #  #  YES  #  #  Has patient had a PCN reaction causing severe rash involving mucus membranes or skin necrosis: No Has patient had a PCN reaction that required hospitalization: No Has patient had a PCN reaction occurring within the last 10 years: No Can take amoxicillin     . Levaquin [Levofloxacin] Hives    rash  . Morphine And Related Nausea And Vomiting    Makes her crawl out of her skin  . Sulfa Antibiotics Rash    Current Outpatient Medications  Medication Sig Dispense Refill  . albuterol (PROVENTIL HFA;VENTOLIN HFA) 108 (90 BASE) MCG/ACT inhaler Inhale 2 puffs into the lungs every 6 (six) hours as needed for wheezing.    Marland Kitchen amoxicillin (AMOXIL) 500 MG tablet Take 1 tablet (500 mg total)  by mouth 2 (two) times daily for 7 days. 14 tablet 0  . apixaban (ELIQUIS) 5 MG TABS tablet Take 1 tablet (5 mg total) by mouth 2 (two) times daily. 60 tablet 2  . arformoterol (BROVANA) 15 MCG/2ML NEBU Take 2 mLs (15 mcg total) by nebulization 2 (two) times daily. 120 mL 1  . Ascorbic Acid (VITAMIN C) 1000 MG tablet Take 1,000 mg by mouth daily.    Marland Kitchen atorvastatin (LIPITOR) 20 MG tablet Take 1 tablet (20 mg total) by mouth at bedtime. (Patient taking differently: Take 20 mg by mouth daily. ) 90 tablet 3  . Cholecalciferol (VITAMIN D3) 1000 UNITS CAPS Take 1,000 Units by mouth 2 (two) times daily.     . clobetasol ointment (TEMOVATE) 9.32 % Apply 1 application topically 2 (two) times daily as needed (psoriasis).   5  . clopidogrel (PLAVIX) 75 MG tablet Take 1 tablet (75 mg total) by mouth daily. 30 tablet 2  . gabapentin (NEURONTIN) 300 MG capsule Take 1 capsule (300 mg total) by mouth 3 (three) times daily. 90 capsule 1  . hydrochlorothiazide (HYDRODIURIL) 25 MG tablet TAKE 1 TABLET DAILY (Patient taking differently: Take 25 mg by mouth daily. ) 30 tablet 2  . insulin NPH-regular Human (NOVOLIN 70/30) (70-30) 100 UNIT/ML injection Inject 75 Units into the skin 2 (two) times daily.     Marland Kitchen  lisinopril (ZESTRIL) 10 MG tablet Take 10 mg by mouth 2 (two) times daily.    . Multiple Vitamin (MULTIVITAMIN WITH MINERALS) TABS tablet Take 1 tablet by mouth daily.    . Multiple Vitamins-Minerals (ZINC PO) Take 1 tablet by mouth daily.    . naproxen sodium (ALEVE) 220 MG tablet Take 220 mg by mouth 2 (two) times daily as needed (pain/headaches).    . oxyCODONE-acetaminophen (PERCOCET/ROXICET) 5-325 MG tablet Take 1-2 tablets by mouth every 4 (four) hours as needed for moderate pain. 30 tablet 0  . Probiotic CAPS Take 1 capsule by mouth daily.    . revefenacin (YUPELRI) 175 MCG/3ML nebulizer solution Take 3 mLs (175 mcg total) by nebulization daily. 90 mL 1  . sertraline (ZOLOFT) 100 MG tablet Take 100 mg by  mouth once daily (Patient taking differently: Take 100 mg by mouth daily. ) 180 tablet 1   No current facility-administered medications for this visit.     ROS:  See HPI  Physical Exam:    Incision:  Healing has improved.  She has SS drainage at the distal incision.  Groin wound healing as well.  Less erythema and edema.       Assessment/Plan:  This is a 64 y.o. female who is s/p:  Left femoral to below-knee popliteal artery bypass with ipsilateral nonreversed saphenous vein,Left external iliac, common femoral, profundofemoral endarterectomy, Balloon angioplasty, right femoral-popliteal bypass.    Plan wet to dry dressing changes to continue daily she will f/u in 2 weeks.  I asked her to call sooner if she has concerns.    Roxy Horseman PA-C [facility] [phone]  Clinic MD:  Oneida Alar

## 2020-01-01 ENCOUNTER — Other Ambulatory Visit: Payer: Self-pay

## 2020-01-01 ENCOUNTER — Ambulatory Visit (INDEPENDENT_AMBULATORY_CARE_PROVIDER_SITE_OTHER): Payer: Medicare Other | Admitting: Internal Medicine

## 2020-01-01 DIAGNOSIS — J9601 Acute respiratory failure with hypoxia: Secondary | ICD-10-CM

## 2020-01-01 LAB — PULMONARY FUNCTION TEST
DL/VA % pred: 101 %
DL/VA: 4.12 ml/min/mmHg/L
DLCO cor % pred: 92 %
DLCO cor: 20.29 ml/min/mmHg
DLCO unc % pred: 87 %
DLCO unc: 19.07 ml/min/mmHg
FEF 25-75 Post: 1.93 L/sec
FEF 25-75 Pre: 1.99 L/sec
FEF2575-%Change-Post: -3 %
FEF2575-%Pred-Post: 85 %
FEF2575-%Pred-Pre: 88 %
FEV1-%Change-Post: 0 %
FEV1-%Pred-Post: 87 %
FEV1-%Pred-Pre: 88 %
FEV1-Post: 2.35 L
FEV1-Pre: 2.37 L
FEV1FVC-%Change-Post: -2 %
FEV1FVC-%Pred-Pre: 99 %
FEV6-%Change-Post: 1 %
FEV6-%Pred-Post: 94 %
FEV6-%Pred-Pre: 92 %
FEV6-Post: 3.17 L
FEV6-Pre: 3.11 L
FEV6FVC-%Pred-Post: 104 %
FEV6FVC-%Pred-Pre: 104 %
FVC-%Change-Post: 1 %
FVC-%Pred-Post: 90 %
FVC-%Pred-Pre: 88 %
FVC-Post: 3.17 L
FVC-Pre: 3.11 L
Post FEV1/FVC ratio: 74 %
Post FEV6/FVC ratio: 100 %
Pre FEV1/FVC ratio: 76 %
Pre FEV6/FVC Ratio: 100 %
RV % pred: 137 %
RV: 3.11 L
TLC % pred: 117 %
TLC: 6.45 L

## 2020-01-01 NOTE — Progress Notes (Signed)
Full PFT performed today. °

## 2020-01-06 ENCOUNTER — Inpatient Hospital Stay: Payer: Medicare Other | Admitting: Primary Care

## 2020-01-13 ENCOUNTER — Other Ambulatory Visit: Payer: Self-pay

## 2020-01-13 ENCOUNTER — Ambulatory Visit (INDEPENDENT_AMBULATORY_CARE_PROVIDER_SITE_OTHER): Payer: Self-pay | Admitting: Physician Assistant

## 2020-01-13 VITALS — BP 143/78 | HR 97 | Temp 98.0°F | Resp 20 | Ht 67.0 in | Wt 250.4 lb

## 2020-01-13 DIAGNOSIS — I7025 Atherosclerosis of native arteries of other extremities with ulceration: Secondary | ICD-10-CM

## 2020-01-13 DIAGNOSIS — T8131XD Disruption of external operation (surgical) wound, not elsewhere classified, subsequent encounter: Secondary | ICD-10-CM

## 2020-01-13 NOTE — Progress Notes (Signed)
POST OPERATIVE OFFICE NOTE    CC:  F/u for surgery  HPI:  This is a 64 y.o. female who is presents for wound check. She is s/p Left femoral to below-knee popliteal artery bypass with ipsilateral nonreversed saphenous vein,Left external iliac, common femoral, profundofemoral endarterectomy, Balloon angioplasty, right femoral-popliteal bypass onn 10/30/2019 by Dr. Trula Slade. She has had some issues with slow healing of her left lower extremity incisions and developed an abscess at her distal below knee popliteal incision that we have been monitoring. She was last seen two weeks ago. She had no fever or chills at the time. She was instructed to do wet to dry dressing changes and follow up in 2 weeks  She presents today for follow up on wound healing. States that wounds are doing well. Minimal drainage from left leg incision. Denies any real pain. Mild pulling along left thigh on ambulation. She has been doing wet to dry dressing changes daily. Denies any fever or chills  Allergies  Allergen Reactions  . Iohexol Rash    Pt states she had a rash 35-40 yrs ago during a procedure.  Benadryl was given and pt was fine in 30 mins. She has had multiple CT's since with premeds and has done fine.  No anaphylaxis per pt. I updated this record. [name], RTRCT  03/06/17  . Penicillins Rash and Other (See Comments)    PATIENT HAS HAD A PCN REACTION WITH IMMEDIATE RASH, FACIAL/TONGUE/THROAT SWELLING, SOB, OR LIGHTHEADEDNESS WITH HYPOTENSION:  #  #  YES  #  #  Has patient had a PCN reaction causing severe rash involving mucus membranes or skin necrosis: No Has patient had a PCN reaction that required hospitalization: No Has patient had a PCN reaction occurring within the last 10 years: No Can take amoxicillin     . Levaquin [Levofloxacin] Hives    rash  . Morphine And Related Nausea And Vomiting    Makes her crawl out of her skin  . Sulfa Antibiotics Rash    Current Outpatient Medications  Medication  Sig Dispense Refill  . albuterol (PROVENTIL HFA;VENTOLIN HFA) 108 (90 BASE) MCG/ACT inhaler Inhale 2 puffs into the lungs every 6 (six) hours as needed for wheezing.    Marland Kitchen apixaban (ELIQUIS) 5 MG TABS tablet Take 1 tablet (5 mg total) by mouth 2 (two) times daily. 60 tablet 2  . arformoterol (BROVANA) 15 MCG/2ML NEBU Take 2 mLs (15 mcg total) by nebulization 2 (two) times daily. 120 mL 1  . Ascorbic Acid (VITAMIN C) 1000 MG tablet Take 1,000 mg by mouth daily.    Marland Kitchen atorvastatin (LIPITOR) 20 MG tablet Take 1 tablet (20 mg total) by mouth at bedtime. (Patient taking differently: Take 20 mg by mouth daily. ) 90 tablet 3  . Cholecalciferol (VITAMIN D3) 1000 UNITS CAPS Take 1,000 Units by mouth 2 (two) times daily.     . clobetasol ointment (TEMOVATE) 1.44 % Apply 1 application topically 2 (two) times daily as needed (psoriasis).   5  . clopidogrel (PLAVIX) 75 MG tablet Take 1 tablet (75 mg total) by mouth daily. 30 tablet 2  . gabapentin (NEURONTIN) 300 MG capsule Take 1 capsule (300 mg total) by mouth 3 (three) times daily. 90 capsule 1  . hydrochlorothiazide (HYDRODIURIL) 25 MG tablet TAKE 1 TABLET DAILY (Patient taking differently: Take 25 mg by mouth daily. ) 30 tablet 2  . insulin NPH-regular Human (NOVOLIN 70/30) (70-30) 100 UNIT/ML injection Inject 75 Units into the skin 2 (two)  times daily.     Marland Kitchen lisinopril (ZESTRIL) 10 MG tablet Take 10 mg by mouth 2 (two) times daily.    . Multiple Vitamin (MULTIVITAMIN WITH MINERALS) TABS tablet Take 1 tablet by mouth daily.    . Multiple Vitamins-Minerals (ZINC PO) Take 1 tablet by mouth daily.    . naproxen sodium (ALEVE) 220 MG tablet Take 220 mg by mouth 2 (two) times daily as needed (pain/headaches).    . Probiotic CAPS Take 1 capsule by mouth daily.    . revefenacin (YUPELRI) 175 MCG/3ML nebulizer solution Take 3 mLs (175 mcg total) by nebulization daily. 90 mL 1  . sertraline (ZOLOFT) 100 MG tablet Take 100 mg by mouth once daily (Patient taking  differently: Take 100 mg by mouth daily. ) 180 tablet 1   No current facility-administered medications for this visit.     ROS:  See HPI  Physical Exam:  Vitals:   01/13/20 1000  BP: (!) 143/78  Pulse: 97  Resp: 20  Temp: 98 F (36.7 C)  TempSrc: Temporal  SpO2: 97%  Weight: 250 lb 6.4 oz (113.6 kg)  Height: 5\' 7"  (1.702 m)    Incision:  Left groin and thigh incisions healed. Left popliteal incision with 1cm x .50cm opening in the distal aspect of incision. Mild fibrinous exudate. No drainage on compression. No erythema surrounding. The proximal incision like with some dried blood from scab getting pulled off   Extremities:  Bilateral lower extremities well perfused and warm Neuro: alert and oriented Abdomen:  Obese, soft, non tender, normal bowel sounds  Assessment/Plan:  This is a 64 y.o. female who is s/p Left femoral to below-knee popliteal artery bypass with ipsilateral nonreversed saphenous vein,Left external iliac, common femoral, profundofemoral endarterectomy, Balloon angioplasty, right femoral-popliteal bypass on 10/30/2019 by Dr. Trula Slade. Has had some issues with incisional wound healing. Had developed small abscess in the distal popliteal incision. This is healing well with wet to dry dressing changes daily. Recommend she continue these until the incision is completely healed. No signs of infection.  - she will follow up sooner if she has any concerns -she will follow up in 2-3 weeks for wound check   Karoline Caldwell, PA-C [facility] [phone]  Clinic MD:  Dr. Trula Slade

## 2020-01-23 ENCOUNTER — Ambulatory Visit (INDEPENDENT_AMBULATORY_CARE_PROVIDER_SITE_OTHER): Payer: Medicare Other | Admitting: Pulmonary Disease

## 2020-01-23 ENCOUNTER — Other Ambulatory Visit: Payer: Self-pay

## 2020-01-23 ENCOUNTER — Telehealth: Payer: Self-pay

## 2020-01-23 ENCOUNTER — Encounter: Payer: Self-pay | Admitting: Pulmonary Disease

## 2020-01-23 ENCOUNTER — Ambulatory Visit (INDEPENDENT_AMBULATORY_CARE_PROVIDER_SITE_OTHER): Payer: BLUE CROSS/BLUE SHIELD

## 2020-01-23 VITALS — BP 140/80 | HR 111 | Temp 97.2°F | Ht 67.0 in | Wt 254.0 lb

## 2020-01-23 DIAGNOSIS — R0609 Other forms of dyspnea: Secondary | ICD-10-CM

## 2020-01-23 DIAGNOSIS — J9601 Acute respiratory failure with hypoxia: Secondary | ICD-10-CM

## 2020-01-23 DIAGNOSIS — Z87891 Personal history of nicotine dependence: Secondary | ICD-10-CM

## 2020-01-23 DIAGNOSIS — I272 Pulmonary hypertension, unspecified: Secondary | ICD-10-CM | POA: Insufficient documentation

## 2020-01-23 DIAGNOSIS — R0600 Dyspnea, unspecified: Secondary | ICD-10-CM

## 2020-01-23 DIAGNOSIS — J439 Emphysema, unspecified: Secondary | ICD-10-CM

## 2020-01-23 NOTE — Progress Notes (Signed)
@Patient  ID: [name] [name], female    DOB: 1956-02-16, 64 y.o.   MRN: [id number]  Chief Complaint  Patient presents with  . Hospitalization Follow-up    Acute Hypoxemic Resp. failure post-op and A-fib    Referring provider: Jacelyn Pi, MD  HPI:  64 year old female former smoker initially consulted with our practice in August/2021 when hospitalized for acute respiratory failure.  PMH: Depression, type 2 diabetes, hyperlipidemia, hypertension, PAD Smoker/ Smoking History: Former smoker quit 2021.  94-pack-year smoking history. Maintenance: Candiss Norse Pt of: Needs outpatient pulmonologist  01/23/2020  - Visit   64 year old female former smoker initially consulted with our practice when hospitalized in August/2021.  Patient was currently admitted for a vascular femoropopliteal bypass graft.  She developed acute respiratory failure.  When hospitalized she was maintained on Yupelri, Brovana, Pulmicort.  She was also found to be fluid overloaded.  Patient is presenting to her office as a follow-up.  Patient has completed pulmonary function testing those results are listed below:  01/01/2020-pulmonary function test-FVC 3.11 (88% predicted), postbronchodilator ratio 74, FEV1 2.35 (87% predicted), DLCO 19.07 (87% predicted)  Patient mains adherent to Brovana and Yupelri nebulized meds.  Patient presented to her office today on 1 L of O2 with physical exertion.  Patient walk today in office did not require any oxygen with physical exertion.  She reports her endocrinologist is her primary care provider.  She does not have a cardiologist.  She is requesting cardiology eval today.  Echocardiogram did show mild pulmonary pressures.  She does not feel she has obstructive sleep apnea.  Patient denies snoring.   Questionaires / Pulmonary Flowsheets:   ACT:  No flowsheet data found.  MMRC: mMRC Dyspnea Scale mMRC Score  01/23/2020 2    Epworth:  No flowsheet data found.  Tests:    01/01/2020-pulmonary function test-FVC 3.11 (88% predicted), postbronchodilator ratio 74, FEV1 2.35 (87% predicted), DLCO 19.07 (87% predicted)  11/04/2019-echocardiogram-LV ejection fraction 81%, grade 2 diastolic dysfunction, right ventricular systolic function is normal, mildly elevated pulmonary artery systolic pressure   FENO:  No results found for: NITRICOXIDE  PFT: PFT Results Latest Ref Rng & Units 01/01/2020  FVC-Pre L 3.11  FVC-Predicted Pre % 88  FVC-Post L 3.17  FVC-Predicted Post % 90  Pre FEV1/FVC % % 76  Post FEV1/FCV % % 74  FEV1-Pre L 2.37  FEV1-Predicted Pre % 88  FEV1-Post L 2.35  DLCO uncorrected ml/min/mmHg 19.07  DLCO UNC% % 87  DLCO corrected ml/min/mmHg 20.29  DLCO COR %Predicted % 92  DLVA Predicted % 101  TLC L 6.45  TLC % Predicted % 117  RV % Predicted % 137    WALK:  No flowsheet data found.  Imaging: No results found.  Lab Results:  CBC    Component Value Date/Time   WBC 12.1 (H) 11/24/2019 1701   RBC 4.47 11/24/2019 1701   HGB 11.1 (L) 11/24/2019 1701   HCT 37.9 11/24/2019 1701   PLT 211 11/24/2019 1701   MCV 84.8 11/24/2019 1701   MCH 24.8 (L) 11/24/2019 1701   MCHC 29.3 (L) 11/24/2019 1701   RDW 16.0 (H) 11/24/2019 1701   LYMPHSABS 0.7 11/24/2019 1701   MONOABS 1.2 (H) 11/24/2019 1701   EOSABS 0.0 11/24/2019 1701   BASOSABS 0.0 11/24/2019 1701    BMET    Component Value Date/Time   NA 129 (L) 11/24/2019 1701   K 4.6 11/24/2019 1701   CL 89 (L) 11/24/2019 1701   CO2 31 11/24/2019 1701  GLUCOSE 205 (H) 11/24/2019 1701   BUN 12 11/24/2019 1701   CREATININE 0.67 11/24/2019 1701   CREATININE 0.69 01/28/2013 1110   CALCIUM 10.0 11/24/2019 1701   GFRNONAA >60 11/24/2019 1701   GFRAA >60 11/24/2019 1701    BNP No results found for: BNP  ProBNP No results found for: PROBNP  Specialty Problems      Pulmonary Problems   Allergic rhinitis   Extrinsic asthma   Emphysema/COPD (HCC)   CAP (community acquired  pneumonia)   Acute hypoxemic respiratory failure (HCC)   COPD exacerbation (HCC)   Respiratory distress   Dyspnea      Allergies  Allergen Reactions  . Iohexol Rash    Pt states she had a rash 35-40 yrs ago during a procedure.  Benadryl was given and pt was fine in 30 mins. She has had multiple CT's since with premeds and has done fine.  No anaphylaxis per pt. I updated this record. [name], RTRCT  03/06/17  . Penicillins Rash and Other (See Comments)    PATIENT HAS HAD A PCN REACTION WITH IMMEDIATE RASH, FACIAL/TONGUE/THROAT SWELLING, SOB, OR LIGHTHEADEDNESS WITH HYPOTENSION:  #  #  YES  #  #  Has patient had a PCN reaction causing severe rash involving mucus membranes or skin necrosis: No Has patient had a PCN reaction that required hospitalization: No Has patient had a PCN reaction occurring within the last 10 years: No Can take amoxicillin     . Levaquin [Levofloxacin] Hives    rash  . Morphine And Related Nausea And Vomiting    Makes her crawl out of her skin  . Sulfa Antibiotics Rash    Immunization History  Administered Date(s) Administered  . DTaP 04/26/2010  . Influenza Split 01/03/2011, 01/19/2012    Past Medical History:  Diagnosis Date  . Abdominal abscess   . Allergic rhinitis   . Arthritis    Hip and neck  . Asthma   . Bacteremia 03/2017  . Carotid stenosis, left 04/2009   50-69%  . Colon polyps 12/05  . COPD (chronic obstructive pulmonary disease) ([place])   . Depression   . Diabetes mellitus    followed by Dr. Chalmers Cater- type II  . Dyspnea    with exertion   . Folate deficiency   . Hyperlipidemia   . Hypertension   . Lumbar disc disease    h/o hnp with repair  . Obesity, morbid (more than 100 lbs over ideal weight or BMI > 40) (HCC)   . Peripheral vascular disease ([place])   . Psoriasis   . Thrombocytopenia ([place])    work up with Dr. Griffith Citron neg in 2000  . Tobacco abuse     Tobacco History: Social History   Tobacco Use  Smoking Status Former  Smoker  . Packs/day: 2.00  . Years: 47.00  . Pack years: 94.00  . Types: Cigarettes  . Quit date: 10/30/2019  . Years since quitting: 0.2  Smokeless Tobacco Never Used   Counseling given: Not Answered   Continue to not smoke  Outpatient Encounter Medications as of 01/23/2020  Medication Sig  . albuterol (PROVENTIL HFA;VENTOLIN HFA) 108 (90 BASE) MCG/ACT inhaler Inhale 2 puffs into the lungs every 6 (six) hours as needed for wheezing.  Marland Kitchen apixaban (ELIQUIS) 5 MG TABS tablet Take 1 tablet (5 mg total) by mouth 2 (two) times daily.  Marland Kitchen arformoterol (BROVANA) 15 MCG/2ML NEBU Take 2 mLs (15 mcg total) by nebulization 2 (two) times daily.  Marland Kitchen  Ascorbic Acid (VITAMIN C) 1000 MG tablet Take 1,000 mg by mouth daily.  Marland Kitchen atorvastatin (LIPITOR) 20 MG tablet Take 1 tablet (20 mg total) by mouth at bedtime. (Patient taking differently: Take 20 mg by mouth daily. )  . Cholecalciferol (VITAMIN D3) 1000 UNITS CAPS Take 1,000 Units by mouth 2 (two) times daily.   . clobetasol ointment (TEMOVATE) 8.36 % Apply 1 application topically 2 (two) times daily as needed (psoriasis).   . clopidogrel (PLAVIX) 75 MG tablet Take 1 tablet (75 mg total) by mouth daily.  Marland Kitchen gabapentin (NEURONTIN) 300 MG capsule Take 1 capsule (300 mg total) by mouth 3 (three) times daily.  . hydrochlorothiazide (HYDRODIURIL) 25 MG tablet TAKE 1 TABLET DAILY (Patient taking differently: Take 25 mg by mouth daily. )  . insulin NPH-regular Human (NOVOLIN 70/30) (70-30) 100 UNIT/ML injection Inject 75 Units into the skin 2 (two) times daily.   Marland Kitchen lisinopril (ZESTRIL) 10 MG tablet Take 10 mg by mouth 2 (two) times daily.  . Multiple Vitamin (MULTIVITAMIN WITH MINERALS) TABS tablet Take 1 tablet by mouth daily.  . Multiple Vitamins-Minerals (ZINC PO) Take 1 tablet by mouth daily.  . naproxen sodium (ALEVE) 220 MG tablet Take 220 mg by mouth 2 (two) times daily as needed (pain/headaches).  . Probiotic CAPS Take 1 capsule by mouth daily.  .  revefenacin (YUPELRI) 175 MCG/3ML nebulizer solution Take 3 mLs (175 mcg total) by nebulization daily.  . sertraline (ZOLOFT) 100 MG tablet Take 100 mg by mouth once daily (Patient taking differently: Take 100 mg by mouth daily. )   No facility-administered encounter medications on file as of 01/23/2020.     Review of Systems  Review of Systems  Constitutional: Positive for fatigue. Negative for activity change and fever.  HENT: Positive for congestion. Negative for sinus pressure, sinus pain and sore throat.   Respiratory: Positive for cough, shortness of breath and wheezing.   Cardiovascular: Negative for chest pain and palpitations.  Gastrointestinal: Negative for diarrhea, nausea and vomiting.  Musculoskeletal: Negative for arthralgias.  Neurological: Negative for dizziness.  Psychiatric/Behavioral: Negative for sleep disturbance. The patient is not nervous/anxious.      Physical Exam  BP 140/80 (BP Location: Left Arm, Cuff Size: Normal)   Pulse (!) 111   Temp (!) 97.2 F (36.2 C) (Oral)   Ht 5\' 7"  (1.702 m)   Wt 254 lb (115.2 kg)   SpO2 96%   BMI 39.78 kg/m   Wt Readings from Last 5 Encounters:  01/23/20 254 lb (115.2 kg)  01/13/20 250 lb 6.4 oz (113.6 kg)  12/26/19 247 lb 9.6 oz (112.3 kg)  12/23/19 245 lb 8 oz (111.4 kg)  12/09/19 250 lb 3.2 oz (113.5 kg)    BMI Readings from Last 5 Encounters:  01/23/20 39.78 kg/m  01/13/20 39.22 kg/m  12/26/19 38.78 kg/m  12/23/19 38.45 kg/m  12/09/19 39.19 kg/m     Physical Exam Vitals and nursing note reviewed.  Constitutional:      General: She is not in acute distress.    Appearance: Normal appearance. She is obese.  HENT:     Head: Normocephalic and atraumatic.     Right Ear: Tympanic membrane, ear canal and external ear normal. There is no impacted cerumen.     Left Ear: Tympanic membrane, ear canal and external ear normal. There is no impacted cerumen.     Nose: Nose normal. No congestion.      Mouth/Throat:     Mouth: Mucous membranes are  moist.     Pharynx: Oropharynx is clear.  Eyes:     Pupils: Pupils are equal, round, and reactive to light.  Cardiovascular:     Rate and Rhythm: Normal rate and regular rhythm.     Pulses: Normal pulses.     Heart sounds: Normal heart sounds. No murmur heard.   Pulmonary:     Effort: Pulmonary effort is normal. No respiratory distress.     Breath sounds: Normal breath sounds. No decreased air movement. No decreased breath sounds, wheezing or rales.  Musculoskeletal:     Cervical back: Normal range of motion.  Skin:    General: Skin is warm and dry.     Capillary Refill: Capillary refill takes less than 2 seconds.  Neurological:     General: No focal deficit present.     Mental Status: She is alert and oriented to person, place, and time. Mental status is at baseline.     Gait: Gait normal.  Psychiatric:        Mood and Affect: Mood normal.        Behavior: Behavior normal.        Thought Content: Thought content normal.        Judgment: Judgment normal.       Assessment & Plan:   Pulmonary HTN (HCC) Mildly elevated pulmonary pressures on most recent echocardiogram BMI of 39.78 Recent acute respiratory failure during hospitalization  Plan: We will order split-night sleep study to rule out obstructive sleep apnea Walk today in office did not have any oxygen desaturations on room air  Acute hypoxemic respiratory failure (HCC) Walk today in office no oxygen desaturations on room air Recovering from acute hypoxemic respiratory failure from hospitalization August/2021  Emphysema/COPD [facility]) Reviewed pulmonary function testing with patient Read as mild obstructive disease probable  Plan: Continue Brovana Continue Yupelri Continue to not smoke Referred to lung cancer screening program  Former smoker Former smoker Quit August/2021 94-pack-year smoking history  Plan : Refer to lung cancer screening  program  Dyspnea Plan: We will refer to cardiology for cardiac work-up and eval of dyspnea     Return in about 2 months (around 03/24/2020), or if symptoms worsen or fail to improve, for NEW PULMONOLOGIST IN 2min SLOT.   Lauraine Rinne, NP 01/23/2020   This appointment required 32 minutes of patient care (this includes precharting, chart review, review of results, face-to-face care, etc.).

## 2020-01-23 NOTE — Assessment & Plan Note (Signed)
Walk today in office no oxygen desaturations on room air Recovering from acute hypoxemic respiratory failure from hospitalization August/2021

## 2020-01-23 NOTE — Assessment & Plan Note (Addendum)
Mildly elevated pulmonary pressures on most recent echocardiogram BMI of 39.78 Recent acute respiratory failure during hospitalization  Plan: We will order split-night sleep study to rule out obstructive sleep apnea Walk today in office did not have any oxygen desaturations on room air

## 2020-01-23 NOTE — Assessment & Plan Note (Signed)
Former smoker Quit August/2021 94-pack-year smoking history  Plan : Refer to lung cancer screening program

## 2020-01-23 NOTE — Telephone Encounter (Signed)
Verbal order

## 2020-01-23 NOTE — Assessment & Plan Note (Signed)
Reviewed pulmonary function testing with patient Read as mild obstructive disease probable  Plan: Continue Brovana Continue Yupelri Continue to not smoke Referred to lung cancer screening program

## 2020-01-23 NOTE — Assessment & Plan Note (Signed)
Plan: We will refer to cardiology for cardiac work-up and eval of dyspnea

## 2020-01-23 NOTE — Patient Instructions (Addendum)
You were seen today by Lauraine Rinne, NP  for:   1. Acute hypoxemic respiratory failure (HCC)  Walk today in office  Tolerated walk in office today without any oxygen desaturations  Continue to use oxygen therapy at night until after you complete your split-night sleep study  Based off your walk today you do not require oxygen with physical exertion   2. Dyspnea on exertion  - Ambulatory referral to Cardiology  Given your recent hospitalization as well as fluid overload when hospitalized we will refer to cardiology see can establish  Continue Brovana and Yupelri nebulized meds  3. Former smoker  - DG Chest 2 View; Future - Ambulatory Referral for Lung Cancer Scre  We will refer you today to our lung cancer screening program >>>This is based off of your 94 pack-year smoking history >>> This is a recommendation from the [place] preventative services task force (USPSTF) >>>The USPSTF recommends annual screening for lung cancer with low-dose computed tomography (LDCT) in adults aged 66 to 80 years who have a 20 pack-year smoking history and currently smoke or have quit within the past 15 years. Screening should be discontinued once a person has not smoked for 15 years or develops a health problem that substantially limits life expectancy or the ability or willingness to have curative lung surgery.   Our office will call you and set up an appointment with Eric Form (Nurse Practitioner) who leads this program.  This appointment takes place in our office.  After completing this meeting with Eric Form NP you will get a low-dose CT as the screening >>>We will call you with those results    4. Pulmonary HTN (Reform)  - Split night study; Future  With your elevated pulmonary pressures seen on your echocardiogram as well as BMI of 39 I believe it is reasonable to rule out obstructive sleep apnea.  I have ordered a split-night sleep study  We recommend today:  Orders Placed This  Encounter  Procedures  . DG Chest 2 View    Standing Status:   Future    Number of Occurrences:   1    Standing Expiration Date:   05/22/2020    Order Specific Question:   Reason for Exam (SYMPTOM  OR DIAGNOSIS REQUIRED)    Answer:   copd    Order Specific Question:   Preferred imaging location?    Answer:   Internal    Order Specific Question:   Radiology Contrast Protocol - do NOT remove file path    Answer:   [email or web address]  . Ambulatory Referral for Lung Cancer Scre    Referral Priority:   Routine    Referral Type:   Consultation    Referral Reason:   Specialty Services Required    Number of Visits Requested:   1  . Ambulatory referral to Cardiology    Referral Priority:   Routine    Referral Type:   Consultation    Referral Reason:   Specialty Services Required    Requested Specialty:   Cardiology    Number of Visits Requested:   1  . Split night study    Standing Status:   Future    Standing Expiration Date:   07/22/2020    Order Specific Question:   Where should this test be performed:    Answer:   [facility]   Orders Placed This Encounter  Procedures  . DG Chest 2 View  . Ambulatory Referral for Lung Cancer Scre  .  Ambulatory referral to Cardiology  . Split night study   No orders of the defined types were placed in this encounter.   Follow Up:    Return in about 2 months (around 66/06/2020), or if symptoms worsen or fail to improve, for NEW PULMONOLOGIST IN 60min SLOT.   Notification of test results are managed in the following manner: If there are  any recommendations or changes to the  plan of care discussed in office today,  we will contact you and let you know what they are. If you do not hear from [place], then your results are normal and you can view them through your  MyChart account , or a letter will be sent to you. Thank you again for trusting [place] with your care  - Thank you, [facility]    It is flu season:   >>> Best ways to protect herself from the flu: Receive the yearly flu vaccine, practice good hand hygiene washing with soap and also using hand sanitizer when available, eat a nutritious meals, get adequate rest, hydrate appropriately       Please contact the office if your symptoms worsen or you have concerns that you are not improving.   Thank you for choosing [facility] for your healthcare, and for allowing [place] to partner with you on your healthcare journey. I am thankful to be able to provide care to you today.   Wyn Quaker FNP-C

## 2020-01-24 NOTE — Progress Notes (Signed)
Reviewed and agree with assessment/plan.   Chesley Mires, MD [facility]/Critical Care 01/24/2020, 8:44 AM Pager:  [phone]

## 2020-01-27 ENCOUNTER — Other Ambulatory Visit: Payer: Self-pay

## 2020-01-27 ENCOUNTER — Ambulatory Visit (INDEPENDENT_AMBULATORY_CARE_PROVIDER_SITE_OTHER): Payer: Medicare Other | Admitting: Physician Assistant

## 2020-01-27 VITALS — BP 162/81 | HR 92 | Temp 98.0°F | Resp 20 | Ht 67.0 in | Wt 252.1 lb

## 2020-01-27 DIAGNOSIS — I739 Peripheral vascular disease, unspecified: Secondary | ICD-10-CM

## 2020-01-27 NOTE — Progress Notes (Signed)
POST OPERATIVE OFFICE NOTE    CC:  F/u for surgery  HPI:  This is a 64 y.o. female who is s/p Left femoral to below-knee popliteal artery bypass with ipsilateral nonreversed saphenous vein,Left external iliac, common femoral, profundofemoral endarterectomy, Balloon angioplasty, right femoral-popliteal bypass.     on 10/30/2019 by Dr. Trula Slade.    She is coming in today for a wound check.  She has had slow healing of her left BK incision and the groin.  She denise pain, erythema, fever or chills.  Allergies  Allergen Reactions  . Iohexol Rash    Pt states she had a rash 35-40 yrs ago during a procedure.  Benadryl was given and pt was fine in 30 mins. She has had multiple CT's since with premeds and has done fine.  No anaphylaxis per pt. I updated this record. [name], RTRCT  03/06/17  . Penicillins Rash and Other (See Comments)    PATIENT HAS HAD A PCN REACTION WITH IMMEDIATE RASH, FACIAL/TONGUE/THROAT SWELLING, SOB, OR LIGHTHEADEDNESS WITH HYPOTENSION:  #  #  YES  #  #  Has patient had a PCN reaction causing severe rash involving mucus membranes or skin necrosis: No Has patient had a PCN reaction that required hospitalization: No Has patient had a PCN reaction occurring within the last 10 years: No Can take amoxicillin     . Levaquin [Levofloxacin] Hives    rash  . Morphine And Related Nausea And Vomiting    Makes her crawl out of her skin  . Sulfa Antibiotics Rash    Current Outpatient Medications  Medication Sig Dispense Refill  . albuterol (PROVENTIL HFA;VENTOLIN HFA) 108 (90 BASE) MCG/ACT inhaler Inhale 2 puffs into the lungs every 6 (six) hours as needed for wheezing.    Marland Kitchen apixaban (ELIQUIS) 5 MG TABS tablet Take 1 tablet (5 mg total) by mouth 2 (two) times daily. 60 tablet 2  . arformoterol (BROVANA) 15 MCG/2ML NEBU Take 2 mLs (15 mcg total) by nebulization 2 (two) times daily. 120 mL 1  . Ascorbic Acid (VITAMIN C) 1000 MG tablet Take 1,000 mg by mouth daily.    Marland Kitchen  atorvastatin (LIPITOR) 20 MG tablet Take 1 tablet (20 mg total) by mouth at bedtime. (Patient taking differently: Take 20 mg by mouth daily. ) 90 tablet 3  . Cholecalciferol (VITAMIN D3) 1000 UNITS CAPS Take 1,000 Units by mouth 2 (two) times daily.     . clobetasol ointment (TEMOVATE) 2.35 % Apply 1 application topically 2 (two) times daily as needed (psoriasis).   5  . clopidogrel (PLAVIX) 75 MG tablet Take 1 tablet (75 mg total) by mouth daily. 30 tablet 2  . gabapentin (NEURONTIN) 300 MG capsule Take 1 capsule (300 mg total) by mouth 3 (three) times daily. 90 capsule 1  . hydrochlorothiazide (HYDRODIURIL) 25 MG tablet TAKE 1 TABLET DAILY (Patient taking differently: Take 25 mg by mouth daily. ) 30 tablet 2  . insulin NPH-regular Human (NOVOLIN 70/30) (70-30) 100 UNIT/ML injection Inject 75 Units into the skin 2 (two) times daily.     Marland Kitchen lisinopril (ZESTRIL) 10 MG tablet Take 10 mg by mouth 2 (two) times daily.    . Multiple Vitamin (MULTIVITAMIN WITH MINERALS) TABS tablet Take 1 tablet by mouth daily.    . Multiple Vitamins-Minerals (ZINC PO) Take 1 tablet by mouth daily.    . naproxen sodium (ALEVE) 220 MG tablet Take 220 mg by mouth 2 (two) times daily as needed (pain/headaches).    Marland Kitchen  Probiotic CAPS Take 1 capsule by mouth daily.    . revefenacin (YUPELRI) 175 MCG/3ML nebulizer solution Take 3 mLs (175 mcg total) by nebulization daily. 90 mL 1  . sertraline (ZOLOFT) 100 MG tablet Take 100 mg by mouth once daily (Patient taking differently: Take 100 mg by mouth daily. ) 180 tablet 1   No current facility-administered medications for this visit.     ROS:  See HPI  Physical Exam:      Incision:  Left BK incision and left groin are now both closed.  She has 2 scab areas  Negative for erythema or LE edema.   Extremities:  Palpable pedal DP pulses B LE.  Her toe tips are almost healed from ischemic changes.    Lungs : non labored breathing now on RA.  She continues to use O2 at  night.   Assessment/Plan:  This is a 64 y.o. female who is s/p:This is a 64 y.o. female who is s/p Left femoral to below-knee popliteal artery bypass with ipsilateral nonreversed saphenous vein,Left external iliac, common femoral, profundofemoral endarterectomy,Balloon angioplasty, right femoral-popliteal bypass on 8/11/2021by Dr. Trula Slade.  Her wounds are healing BK the knee incision she will shower daily and keep an eye on the scabs.  The groin is now completely healed.  She has stopped smoking and is now on O2 just at night.  She will try to increase her walking activity as she tolerates.    F/U in 6 months for repeat ABI and B LE artrial duplex.  If she has problems or concerns she will call.  She can finish her Plavix and restart 81 mg ASA after that.    Roxy Horseman PA-C [facility] [phone]  Clinic MD:  Trula Slade

## 2020-01-29 ENCOUNTER — Other Ambulatory Visit: Payer: Self-pay

## 2020-01-29 DIAGNOSIS — I739 Peripheral vascular disease, unspecified: Secondary | ICD-10-CM

## 2020-02-07 ENCOUNTER — Other Ambulatory Visit: Payer: Self-pay

## 2020-02-07 ENCOUNTER — Ambulatory Visit (HOSPITAL_BASED_OUTPATIENT_CLINIC_OR_DEPARTMENT_OTHER): Payer: Medicare Other | Attending: Pulmonary Disease | Admitting: Pulmonary Disease

## 2020-02-21 ENCOUNTER — Encounter: Payer: Self-pay | Admitting: General Practice

## 2020-02-24 ENCOUNTER — Ambulatory Visit (INDEPENDENT_AMBULATORY_CARE_PROVIDER_SITE_OTHER): Payer: Medicare Other | Admitting: Pulmonary Disease

## 2020-02-24 ENCOUNTER — Encounter: Payer: Self-pay | Admitting: Pulmonary Disease

## 2020-02-24 ENCOUNTER — Telehealth: Payer: Self-pay | Admitting: Pulmonary Disease

## 2020-02-24 ENCOUNTER — Other Ambulatory Visit: Payer: Self-pay

## 2020-02-24 VITALS — BP 130/66 | HR 110 | Temp 97.3°F | Ht 67.0 in | Wt 280.6 lb

## 2020-02-24 DIAGNOSIS — E876 Hypokalemia: Secondary | ICD-10-CM | POA: Diagnosis not present

## 2020-02-24 DIAGNOSIS — R1310 Dysphagia, unspecified: Secondary | ICD-10-CM | POA: Diagnosis not present

## 2020-02-24 DIAGNOSIS — J439 Emphysema, unspecified: Secondary | ICD-10-CM | POA: Diagnosis not present

## 2020-02-24 DIAGNOSIS — I5030 Unspecified diastolic (congestive) heart failure: Secondary | ICD-10-CM

## 2020-02-24 LAB — COMPREHENSIVE METABOLIC PANEL
ALT: 109 U/L — ABNORMAL HIGH (ref 0–35)
AST: 126 U/L — ABNORMAL HIGH (ref 0–37)
Albumin: 3.2 g/dL — ABNORMAL LOW (ref 3.5–5.2)
Alkaline Phosphatase: 222 U/L — ABNORMAL HIGH (ref 39–117)
BUN: 21 mg/dL (ref 6–23)
CO2: 34 mEq/L — ABNORMAL HIGH (ref 19–32)
Calcium: 8.9 mg/dL (ref 8.4–10.5)
Chloride: 100 mEq/L (ref 96–112)
Creatinine, Ser: 0.66 mg/dL (ref 0.40–1.20)
GFR: 91.41 mL/min (ref >60.00)
Glucose, Bld: 68 mg/dL — ABNORMAL LOW (ref 70–99)
Potassium: 2.5 mEq/L — CL (ref 3.5–5.1)
Sodium: 145 mEq/L (ref 135–145)
Total Bilirubin: 1.6 mg/dL — ABNORMAL HIGH (ref 0.2–1.2)
Total Protein: 5.8 g/dL — ABNORMAL LOW (ref 6.0–8.3)

## 2020-02-24 MED ORDER — POTASSIUM CHLORIDE ER 20 MEQ PO TBCR: EXTENDED_RELEASE_TABLET | ORAL | 0 refills | Status: AC

## 2020-02-24 NOTE — Progress Notes (Signed)
Synopsis: Follow up for Respiratory Failure  Subjective:   PATIENT ID: [name] [name] GENDER: female DOB: 25-Jun-1955, MRN: [id number]   HPI  Chief Complaint  Patient presents with  . Consult    retaining fluid in legs and belly since 02/10/20.  sob with activity.  night sweat last night.   [name] [name] is a 64 year old woman, former smoker with obesity, hypertension, diabetes mellitus type II, peripheral arterial disease who returns to pulmonary clinic for evaluation of dyspnea.   She had a complicated hospital admission 10/2019 with acute hypoxemic respiratory failure after a femoropopliteal bypass graft procedure with Vascular Surgery. She was noted to be volume overloaded at that time and improved with diuretic therapy. She was also started on brovana, budesonide and yupleri nebulizer treatments. She last followed up in our clinic on 01/23/20 and had been doing well on brovana and yupelri nebs. She was taken off supplemental oxygen at the last visit as she did not have desaturations less than 88% with ambulation on room air but instructed to continue it at night for concern of sleep disordered breathing. She was ordered for a split night sleep study to check for obstructive sleep apnea.   She has stopped using the brovana and yupelri nebs as she felt these were not helping her shortness of breath. She continues to have exertional shortness of breath and is complaining of lower extremity edema as well as abdominal swelling. She was recently seen by her endocrinologist who prescribed her lasix 16m daily but she has not started this until she spoke with [place]     She complains of cough that is present with eating a drinking.   Past Medical History:  Diagnosis Date  . Abdominal abscess   . Allergic rhinitis   . Arthritis    Hip and neck  . Asthma   . Bacteremia 03/2017  . Carotid stenosis, left 04/2009   50-69%  . Colon polyps 12/05  . COPD (chronic obstructive pulmonary disease)  ([facility]   . Depression   . Diabetes mellitus    followed by Dr. BChalmers Cater type II  . Dyspnea    with exertion   . Folate deficiency   . Hyperlipidemia   . Hypertension   . Lumbar disc disease    h/o hnp with repair  . Obesity, morbid (more than 100 lbs over ideal weight or BMI > 40) (HCC)   . Peripheral vascular disease ([facility]   . Psoriasis   . Thrombocytopenia ([place]    work up with Dr. MGriffith Citronneg in 2000  . Tobacco abuse      Family History  Problem Relation Age of Onset  . Asthma Mother   . Hypertension Mother   . Stroke Father   . Alcohol abuse Father   . Cancer Sister        brain stem tumor  . Suicidality Sister   . Cancer Sister        breast  . Suicidality Brother   . Dementia Sister      Social History   Socioeconomic History  . Marital status: Divorced    Spouse name: Not on file  . Number of children: 2  . Years of education: HS grad  . Highest education level: Not on file  Occupational History  . Occupation: QUALITY MANAGER    Employer: [employer] Tobacco Use  . Smoking status: Former Smoker    Packs/day: 2.00    Years: 47.00    Pack  years: 94.00    Types: Cigarettes    Quit date: 10/30/2019    Years since quitting: 0.3  . Smokeless tobacco: Never Used  Vaping Use  . Vaping Use: Never used  Substance and Sexual Activity  . Alcohol use: No  . Drug use: No  . Sexual activity: Not Currently    Partners: Male    Birth control/protection: I.U.D.  Other Topics Concern  . Not on file  Social History Narrative  . Not on file   Social Determinants of Health   Financial Resource Strain: Not on file  Food Insecurity: Not on file  Transportation Needs: Not on file  Physical Activity: Not on file  Stress: Not on file  Social Connections: Not on file  Intimate Partner Violence: Not on file     Allergies  Allergen Reactions  . Iohexol Rash    Pt states she had a rash 35-40 yrs ago during a procedure.  Benadryl was given and pt was fine in  30 mins. She has had multiple CT's since with premeds and has done fine.  No anaphylaxis per pt. I updated this record. [name], RTRCT  03/06/17  . Penicillins Rash and Other (See Comments)    PATIENT HAS HAD A PCN REACTION WITH IMMEDIATE RASH, FACIAL/TONGUE/THROAT SWELLING, SOB, OR LIGHTHEADEDNESS WITH HYPOTENSION:  #  #  YES  #  #  Has patient had a PCN reaction causing severe rash involving mucus membranes or skin necrosis: No Has patient had a PCN reaction that required hospitalization: No Has patient had a PCN reaction occurring within the last 10 years: No Can take amoxicillin     . Levaquin [Levofloxacin] Hives    rash  . Morphine And Related Nausea And Vomiting    Makes her crawl out of her skin  . Sulfa Antibiotics Rash     Outpatient Medications Prior to Visit  Medication Sig Dispense Refill  . albuterol (PROVENTIL HFA;VENTOLIN HFA) 108 (90 BASE) MCG/ACT inhaler Inhale 2 puffs into the lungs every 6 (six) hours as needed for wheezing.    Marland Kitchen apixaban (ELIQUIS) 5 MG TABS tablet Take 1 tablet (5 mg total) by mouth 2 (two) times daily. 60 tablet 2  . Ascorbic Acid (VITAMIN C) 1000 MG tablet Take 1,000 mg by mouth daily.    Marland Kitchen atorvastatin (LIPITOR) 20 MG tablet Take 1 tablet (20 mg total) by mouth at bedtime. (Patient taking differently: Take 20 mg by mouth daily. ) 90 tablet 3  . Cholecalciferol (VITAMIN D3) 1000 UNITS CAPS Take 1,000 Units by mouth 2 (two) times daily.     . clobetasol ointment (TEMOVATE) 1.61 % Apply 1 application topically 2 (two) times daily as needed (psoriasis).   5  . furosemide (LASIX) 40 MG tablet Take 40 mg by mouth.    . gabapentin (NEURONTIN) 300 MG capsule Take 1 capsule (300 mg total) by mouth 3 (three) times daily. 90 capsule 1  . hydrochlorothiazide (HYDRODIURIL) 25 MG tablet TAKE 1 TABLET DAILY (Patient taking differently: Take 25 mg by mouth daily. ) 30 tablet 2  . insulin NPH-regular Human (NOVOLIN 70/30) (70-30) 100 UNIT/ML injection Inject 60  Units into the skin 2 (two) times daily.     Marland Kitchen lisinopril (ZESTRIL) 10 MG tablet Take 10 mg by mouth 2 (two) times daily.    . Multiple Vitamin (MULTIVITAMIN WITH MINERALS) TABS tablet Take 1 tablet by mouth daily.    . Multiple Vitamins-Minerals (ZINC PO) Take 1 tablet by mouth daily.    Marland Kitchen  OVER THE COUNTER MEDICATION Take 81 mg by mouth daily. Asprin    . Probiotic CAPS Take 1 capsule by mouth daily.    . sertraline (ZOLOFT) 100 MG tablet Take 100 mg by mouth once daily (Patient taking differently: Take 100 mg by mouth daily. ) 180 tablet 1  . naproxen sodium (ALEVE) 220 MG tablet Take 220 mg by mouth 2 (two) times daily as needed (pain/headaches). (Patient not taking: Reported on 02/24/2020)    . arformoterol (BROVANA) 15 MCG/2ML NEBU Take 2 mLs (15 mcg total) by nebulization 2 (two) times daily. (Patient not taking: Reported on 02/24/2020) 120 mL 1  . clopidogrel (PLAVIX) 75 MG tablet Take 1 tablet (75 mg total) by mouth daily. (Patient not taking: Reported on 02/24/2020) 30 tablet 2  . revefenacin (YUPELRI) 175 MCG/3ML nebulizer solution Take 3 mLs (175 mcg total) by nebulization daily. (Patient not taking: Reported on 02/24/2020) 90 mL 1   No facility-administered medications prior to visit.    Review of Systems  Constitutional: Negative for chills, fever, malaise/fatigue and weight loss.  HENT: Negative for congestion and sore throat.   Eyes: Negative.   Respiratory: Positive for cough and shortness of breath.   Cardiovascular: Positive for leg swelling.  Gastrointestinal: Negative for abdominal pain, heartburn, nausea and vomiting.  Genitourinary: Negative.   Musculoskeletal: Negative.   Neurological: Negative for dizziness, weakness and headaches.  Psychiatric/Behavioral: Negative.    Objective:   Vitals:   02/24/20 1413  BP: 130/66  Pulse: (!) 110  Temp: (!) 97.3 F (36.3 C)  TempSrc: Temporal  SpO2: 93%  Weight: 280 lb 9.6 oz (127.3 kg)  Height: '5\' 7"'  (1.702 m)      Physical Exam Constitutional:      General: She is not in acute distress.    Appearance: She is obese.  HENT:     Head: Normocephalic and atraumatic.  Eyes:     General: No scleral icterus.    Conjunctiva/sclera: Conjunctivae normal.     Pupils: Pupils are equal, round, and reactive to light.  Pulmonary:     Effort: Pulmonary effort is normal.     Breath sounds: Decreased breath sounds present. No wheezing, rhonchi or rales.  Abdominal:     General: Bowel sounds are normal.     Palpations: Abdomen is soft.  Musculoskeletal:     Right lower leg: Edema present.     Left lower leg: Edema present.  Skin:    General: Skin is warm and dry.     Capillary Refill: Capillary refill takes less than 2 seconds.  Neurological:     General: No focal deficit present.     Mental Status: She is alert.  Psychiatric:        Mood and Affect: Mood normal.        Behavior: Behavior normal.        Thought Content: Thought content normal.        Judgment: Judgment normal.     CBC    Component Value Date/Time   WBC 12.1 (H) 11/24/2019 1701   RBC 4.47 11/24/2019 1701   HGB 11.1 (L) 11/24/2019 1701   HCT 37.9 11/24/2019 1701   PLT 211 11/24/2019 1701   MCV 84.8 11/24/2019 1701   MCH 24.8 (L) 11/24/2019 1701   MCHC 29.3 (L) 11/24/2019 1701   RDW 16.0 (H) 11/24/2019 1701   LYMPHSABS 0.7 11/24/2019 1701   MONOABS 1.2 (H) 11/24/2019 1701   EOSABS 0.0 11/24/2019 1701   BASOSABS 0.0  11/24/2019 1701   BMP Latest Ref Rng & Units 02/24/2020 11/24/2019 11/13/2019  Glucose 70 - 99 mg/dL 68(L) 205(H) 127(H)  BUN 6 - 23 mg/dL '21 12 20  ' Creatinine 0.40 - 1.20 mg/dL 0.66 0.67 0.57  Sodium 135 - 145 mEq/L 145 129(L) 133(L)  Potassium 3.5 - 5.1 mEq/L 2.5(LL) 4.6 3.8  Chloride 96 - 112 mEq/L 100 89(L) 93(L)  CO2 19 - 32 mEq/L 34(H) 31 33(H)  Calcium 8.4 - 10.5 mg/dL 8.9 10.0 8.3(L)   Chest imaging: CXR 01/23/20 The heart size and mediastinal contours are within normal limits. No pneumothorax or  pleural effusion is noted. Significantly decreased diffuse interstitial opacities are noted compared to prior exam suggesting nearly resolved edema or atypical inflammation. The visualized skeletal structures are unremarkable.  PFT: PFT Results Latest Ref Rng & Units 01/01/2020  FVC-Pre L 3.11  FVC-Predicted Pre % 88  FVC-Post L 3.17  FVC-Predicted Post % 90  Pre FEV1/FVC % % 76  Post FEV1/FCV % % 74  FEV1-Pre L 2.37  FEV1-Predicted Pre % 88  FEV1-Post L 2.35  DLCO uncorrected ml/min/mmHg 19.07  DLCO UNC% % 87  DLCO corrected ml/min/mmHg 20.29  DLCO COR %Predicted % 92  DLVA Predicted % 101  TLC L 6.45  TLC % Predicted % 117  RV % Predicted % 137   Echo: 11/04/19 1. Abnormal septal motion . Left ventricular ejection fraction, by  estimation, is 55%. The left ventricle has normal function. The left  ventricle has no regional wall motion abnormalities. Left ventricular  diastolic parameters are consistent with Grade  II diastolic dysfunction (pseudonormalization). Elevated left ventricular  end-diastolic pressure.  2. Right ventricular systolic function is normal. The right ventricular  size is normal. There is mildly elevated pulmonary artery systolic  pressure.  3. Left atrial size was moderately dilated.  4. Right atrial size was mildly dilated.  5. The mitral valve is normal in structure. No evidence of mitral valve  regurgitation. No evidence of mitral stenosis.  6. AV not well seen. Scleroitic with mean gardient 11 mmHg but calculated  AVA 2.4 cm2 . The aortic valve was not well visualized. Aortic valve  regurgitation is trivial. Mild to moderate aortic valve  sclerosis/calcification is present, without any  evidence of aortic stenosis.  7. The inferior vena cava is normal in size with greater than 50%  respiratory variability, suggesting right atrial pressure of 3 mmHg.  Assessment & Plan:   Diastolic heart failure, unspecified HF chronicity ([place]) - Plan:  Comp Met (CMET), Comp Met (CMET), CANCELED: COMPLETE METABOLIC PANEL WITH GFR  Dysphagia, unspecified type  Pulmonary emphysema, unspecified emphysema type ([place])  Hypokalemia - Plan: Ambulatory referral to Cardiology  Discussion: [name] [name] is a 64 year old woman, former smoker with obesity, hypertension, diabetes mellitus type II, peripheral arterial disease who returns to pulmonary clinic for evaluation of dyspnea.   Her dyspnea is multifactorial in setting of heart failure preserved EF, obstructive lung disease with air trapping, sleep disordered breathing, obesity and deconditioning.  Emphasized the importance of obtaining a sleep study and she is scheduled next month for this. She is to continue wearing oxygen at night.  It is ok for now to stop the nebulizer treatments if she did not find benefit from them as I believe fluid management will provide more relief for her at this time. She is to start lasix 46m daily. BMP checked today showed a potassium of 2.574m/L. She was prescribed potassium chloride tablets and instructed to  take 72mq/L today and then 42m/L daily. We will repeat BMP on 12/9. Referral to cardiology placed.   Follow up in 3 months  [name] JacksonMD [facility] Office: [phone] See Amion for Pager Details    Current Outpatient Medications:  .  albuterol (PROVENTIL HFA;VENTOLIN HFA) 108 (90 BASE) MCG/ACT inhaler, Inhale 2 puffs into the lungs every 6 (six) hours as needed for wheezing., Disp: , Rfl:  .  apixaban (ELIQUIS) 5 MG TABS tablet, Take 1 tablet (5 mg total) by mouth 2 (two) times daily., Disp: 60 tablet, Rfl: 2 .  Ascorbic Acid (VITAMIN C) 1000 MG tablet, Take 1,000 mg by mouth daily., Disp: , Rfl:  .  atorvastatin (LIPITOR) 20 MG tablet, Take 1 tablet (20 mg total) by mouth at bedtime. (Patient taking differently: Take 20 mg by mouth daily. ), Disp: 90 tablet, Rfl: 3 .  Cholecalciferol (VITAMIN D3) 1000 UNITS CAPS,  Take 1,000 Units by mouth 2 (two) times daily. , Disp: , Rfl:  .  clobetasol ointment (TEMOVATE) 0.0.26, Apply 1 application topically 2 (two) times daily as needed (psoriasis). , Disp: , Rfl: 5 .  furosemide (LASIX) 40 MG tablet, Take 40 mg by mouth., Disp: , Rfl:  .  gabapentin (NEURONTIN) 300 MG capsule, Take 1 capsule (300 mg total) by mouth 3 (three) times daily., Disp: 90 capsule, Rfl: 1 .  hydrochlorothiazide (HYDRODIURIL) 25 MG tablet, TAKE 1 TABLET DAILY (Patient taking differently: Take 25 mg by mouth daily. ), Disp: 30 tablet, Rfl: 2 .  insulin NPH-regular Human (NOVOLIN 70/30) (70-30) 100 UNIT/ML injection, Inject 60 Units into the skin 2 (two) times daily. , Disp: , Rfl:  .  lisinopril (ZESTRIL) 10 MG tablet, Take 10 mg by mouth 2 (two) times daily., Disp: , Rfl:  .  Multiple Vitamin (MULTIVITAMIN WITH MINERALS) TABS tablet, Take 1 tablet by mouth daily., Disp: , Rfl:  .  Multiple Vitamins-Minerals (ZINC PO), Take 1 tablet by mouth daily., Disp: , Rfl:  .  OVER THE COUNTER MEDICATION, Take 81 mg by mouth daily. Asprin, Disp: , Rfl:  .  Probiotic CAPS, Take 1 capsule by mouth daily., Disp: , Rfl:  .  sertraline (ZOLOFT) 100 MG tablet, Take 100 mg by mouth once daily (Patient taking differently: Take 100 mg by mouth daily. ), Disp: 180 tablet, Rfl: 1 .  naproxen sodium (ALEVE) 220 MG tablet, Take 220 mg by mouth 2 (two) times daily as needed (pain/headaches). (Patient not taking: Reported on 02/24/2020), Disp: , Rfl:  .  Potassium Chloride ER 20 MEQ TBCR, Take 2 tablets now and 2 tablets this evening.  Take 2 tablets daily starting tomorrow 02/25/2020., Disp: 64 tablet, Rfl: 0

## 2020-02-24 NOTE — Telephone Encounter (Signed)
Called and spoke with patient, advised of low potassium of 2.5, advised not to take dose of lasix today. She stated she had already taken a dose when she got home.  Advised a prescription for potassium is being sent to her pharmacy for 40 meq, she should take a dose when she gets the script and then a dose this evening.  Advised that starting tomorrow to take 40 meq daily and to have her labs drawn on Thursday, 02/27/20 in stead of on Monday 03/02/20.  She verbalized understanding.  Nothing further needed.

## 2020-02-24 NOTE — Patient Instructions (Signed)
Lasix 40mg  daily  We will check labs today and in 1 week to check your renal function  We will schedule you for a sleep study  Follow up in 3 months

## 2020-02-27 ENCOUNTER — Telehealth: Payer: Self-pay

## 2020-02-27 NOTE — Telephone Encounter (Signed)
NOTES ON FILE FROM [facility] [phone], SENT REFERRAL TO SCHEDULING

## 2020-02-29 ENCOUNTER — Telehealth: Payer: Self-pay | Admitting: Pulmonary Disease

## 2020-02-29 DIAGNOSIS — I5032 Chronic diastolic (congestive) heart failure: Secondary | ICD-10-CM

## 2020-02-29 NOTE — Telephone Encounter (Signed)
Called patient to check in since she didn't have repeat labs done yet. She is doing ok and losing weight with the lasix. We discussed she should be taking 40mg  of lasix daily and 23mEq of potassium chloride daily. She will have her labs drawn on Monday 03/02/20 which will include a complete chemistry panel.   Freda Jackson, MD [facility] Office: [phone]   See Amion for Pager Details

## 2020-03-02 ENCOUNTER — Other Ambulatory Visit (INDEPENDENT_AMBULATORY_CARE_PROVIDER_SITE_OTHER): Payer: Medicare Other

## 2020-03-02 DIAGNOSIS — I5032 Chronic diastolic (congestive) heart failure: Secondary | ICD-10-CM

## 2020-03-03 ENCOUNTER — Telehealth: Payer: Self-pay | Admitting: Pulmonary Disease

## 2020-03-03 LAB — COMPREHENSIVE METABOLIC PANEL
ALT: 106 U/L — ABNORMAL HIGH (ref 0–35)
AST: 115 U/L — ABNORMAL HIGH (ref 0–37)
Albumin: 2.9 g/dL — ABNORMAL LOW (ref 3.5–5.2)
Alkaline Phosphatase: 223 U/L — ABNORMAL HIGH (ref 39–117)
BUN: 41 mg/dL — ABNORMAL HIGH (ref 6–23)
CO2: 34 mEq/L — ABNORMAL HIGH (ref 19–32)
Calcium: 8.6 mg/dL (ref 8.4–10.5)
Chloride: 95 mEq/L — ABNORMAL LOW (ref 96–112)
Creatinine, Ser: 1.01 mg/dL (ref 0.40–1.20)
GFR: 58.04 mL/min — ABNORMAL LOW (ref >60.00)
Glucose, Bld: 133 mg/dL — ABNORMAL HIGH (ref 70–99)
Potassium: 2.4 mEq/L — CL (ref 3.5–5.1)
Sodium: 143 mEq/L (ref 135–145)
Total Bilirubin: 3.1 mg/dL — ABNORMAL HIGH (ref 0.2–1.2)
Total Protein: 5.3 g/dL — ABNORMAL LOW (ref 6.0–8.3)

## 2020-03-03 NOTE — Telephone Encounter (Signed)
Informed patient to go to the ER for further workup of her low potassium and abnormal liver function tests. She has had decreased oral intake recently and only tolerating soups to eat. She said she wasn't able to go to the hospital today and understood my recommendation was to go today. She said she would go first thing tomorrow morning.   [name] Jackson, MD [facility] Office: [phone]   See Amion for Pager Details

## 2020-03-03 NOTE — Progress Notes (Signed)
Called patient with her lab results. Instructed her to stop the lasix and take an extra 61mEq of potassium today. Instructed her to go to the ER today for further evaluation and likely admission to work up her low potassium and elevated liver enzymes. She has reduced oral intake and is only tolerating eating soups. She says she can't go to the ER today but will go first thing tomorrow morning. She expressed understanding about my recommendation about going today though.   Freda Jackson, MD [facility] Office: [phone]   See Amion for Pager Details

## 2020-03-03 NOTE — Telephone Encounter (Signed)
Received call report from Haena with [facility] on patient's Potassium done on 03/02/20. JD please review the result/impression copied below:  Potassium of 2.4   Please advise, thank you.

## 2020-03-04 ENCOUNTER — Inpatient Hospital Stay (HOSPITAL_COMMUNITY)
Admission: EM | Admit: 2020-03-04 | Discharge: 2020-03-21 | DRG: 987 | Disposition: E | Payer: Medicare Other | Attending: Internal Medicine | Admitting: Internal Medicine

## 2020-03-04 ENCOUNTER — Emergency Department (HOSPITAL_COMMUNITY): Payer: Medicare Other

## 2020-03-04 ENCOUNTER — Other Ambulatory Visit: Payer: Self-pay

## 2020-03-04 DIAGNOSIS — R591 Generalized enlarged lymph nodes: Secondary | ICD-10-CM

## 2020-03-04 DIAGNOSIS — Z6841 Body Mass Index (BMI) 40.0 and over, adult: Secondary | ICD-10-CM | POA: Diagnosis not present

## 2020-03-04 DIAGNOSIS — M1990 Unspecified osteoarthritis, unspecified site: Secondary | ICD-10-CM | POA: Diagnosis present

## 2020-03-04 DIAGNOSIS — E876 Hypokalemia: Secondary | ICD-10-CM | POA: Diagnosis present

## 2020-03-04 DIAGNOSIS — Z66 Do not resuscitate: Secondary | ICD-10-CM | POA: Diagnosis not present

## 2020-03-04 DIAGNOSIS — E860 Dehydration: Secondary | ICD-10-CM | POA: Diagnosis present

## 2020-03-04 DIAGNOSIS — Z20822 Contact with and (suspected) exposure to covid-19: Secondary | ICD-10-CM | POA: Diagnosis present

## 2020-03-04 DIAGNOSIS — I5033 Acute on chronic diastolic (congestive) heart failure: Secondary | ICD-10-CM | POA: Diagnosis present

## 2020-03-04 DIAGNOSIS — R579 Shock, unspecified: Secondary | ICD-10-CM | POA: Diagnosis not present

## 2020-03-04 DIAGNOSIS — K5730 Diverticulosis of large intestine without perforation or abscess without bleeding: Secondary | ICD-10-CM | POA: Diagnosis present

## 2020-03-04 DIAGNOSIS — E872 Acidosis: Secondary | ICD-10-CM | POA: Diagnosis not present

## 2020-03-04 DIAGNOSIS — K7200 Acute and subacute hepatic failure without coma: Secondary | ICD-10-CM | POA: Diagnosis not present

## 2020-03-04 DIAGNOSIS — Z880 Allergy status to penicillin: Secondary | ICD-10-CM

## 2020-03-04 DIAGNOSIS — K760 Fatty (change of) liver, not elsewhere classified: Secondary | ICD-10-CM | POA: Diagnosis present

## 2020-03-04 DIAGNOSIS — K439 Ventral hernia without obstruction or gangrene: Secondary | ICD-10-CM | POA: Diagnosis present

## 2020-03-04 DIAGNOSIS — D696 Thrombocytopenia, unspecified: Secondary | ICD-10-CM | POA: Diagnosis present

## 2020-03-04 DIAGNOSIS — R627 Adult failure to thrive: Secondary | ICD-10-CM | POA: Diagnosis present

## 2020-03-04 DIAGNOSIS — R109 Unspecified abdominal pain: Secondary | ICD-10-CM

## 2020-03-04 DIAGNOSIS — Z713 Dietary counseling and surveillance: Secondary | ICD-10-CM

## 2020-03-04 DIAGNOSIS — Z881 Allergy status to other antibiotic agents status: Secondary | ICD-10-CM

## 2020-03-04 DIAGNOSIS — I272 Pulmonary hypertension, unspecified: Secondary | ICD-10-CM | POA: Diagnosis present

## 2020-03-04 DIAGNOSIS — J449 Chronic obstructive pulmonary disease, unspecified: Secondary | ICD-10-CM | POA: Diagnosis present

## 2020-03-04 DIAGNOSIS — E11649 Type 2 diabetes mellitus with hypoglycemia without coma: Secondary | ICD-10-CM | POA: Diagnosis not present

## 2020-03-04 DIAGNOSIS — T501X5A Adverse effect of loop [high-ceiling] diuretics, initial encounter: Secondary | ICD-10-CM | POA: Diagnosis present

## 2020-03-04 DIAGNOSIS — L409 Psoriasis, unspecified: Secondary | ICD-10-CM | POA: Diagnosis present

## 2020-03-04 DIAGNOSIS — J309 Allergic rhinitis, unspecified: Secondary | ICD-10-CM | POA: Diagnosis present

## 2020-03-04 DIAGNOSIS — Z8249 Family history of ischemic heart disease and other diseases of the circulatory system: Secondary | ICD-10-CM

## 2020-03-04 DIAGNOSIS — G9341 Metabolic encephalopathy: Secondary | ICD-10-CM | POA: Diagnosis not present

## 2020-03-04 DIAGNOSIS — J9601 Acute respiratory failure with hypoxia: Secondary | ICD-10-CM | POA: Diagnosis not present

## 2020-03-04 DIAGNOSIS — R001 Bradycardia, unspecified: Secondary | ICD-10-CM | POA: Diagnosis not present

## 2020-03-04 DIAGNOSIS — Z9049 Acquired absence of other specified parts of digestive tract: Secondary | ICD-10-CM

## 2020-03-04 DIAGNOSIS — N179 Acute kidney failure, unspecified: Secondary | ICD-10-CM | POA: Diagnosis not present

## 2020-03-04 DIAGNOSIS — E785 Hyperlipidemia, unspecified: Secondary | ICD-10-CM | POA: Diagnosis present

## 2020-03-04 DIAGNOSIS — E875 Hyperkalemia: Secondary | ICD-10-CM | POA: Diagnosis present

## 2020-03-04 DIAGNOSIS — J9809 Other diseases of bronchus, not elsewhere classified: Secondary | ICD-10-CM | POA: Diagnosis present

## 2020-03-04 DIAGNOSIS — I110 Hypertensive heart disease with heart failure: Secondary | ICD-10-CM | POA: Diagnosis present

## 2020-03-04 DIAGNOSIS — Z9119 Patient's noncompliance with other medical treatment and regimen: Secondary | ICD-10-CM

## 2020-03-04 DIAGNOSIS — Z01818 Encounter for other preprocedural examination: Secondary | ICD-10-CM

## 2020-03-04 DIAGNOSIS — R791 Abnormal coagulation profile: Secondary | ICD-10-CM | POA: Diagnosis not present

## 2020-03-04 DIAGNOSIS — I959 Hypotension, unspecified: Secondary | ICD-10-CM

## 2020-03-04 DIAGNOSIS — Z825 Family history of asthma and other chronic lower respiratory diseases: Secondary | ICD-10-CM

## 2020-03-04 DIAGNOSIS — Z882 Allergy status to sulfonamides status: Secondary | ICD-10-CM

## 2020-03-04 DIAGNOSIS — R7401 Elevation of levels of liver transaminase levels: Secondary | ICD-10-CM

## 2020-03-04 DIAGNOSIS — Z91041 Radiographic dye allergy status: Secondary | ICD-10-CM

## 2020-03-04 DIAGNOSIS — C3490 Malignant neoplasm of unspecified part of unspecified bronchus or lung: Secondary | ICD-10-CM | POA: Diagnosis present

## 2020-03-04 DIAGNOSIS — R410 Disorientation, unspecified: Secondary | ICD-10-CM | POA: Diagnosis not present

## 2020-03-04 DIAGNOSIS — Z885 Allergy status to narcotic agent status: Secondary | ICD-10-CM

## 2020-03-04 DIAGNOSIS — R918 Other nonspecific abnormal finding of lung field: Secondary | ICD-10-CM | POA: Diagnosis not present

## 2020-03-04 DIAGNOSIS — E1151 Type 2 diabetes mellitus with diabetic peripheral angiopathy without gangrene: Secondary | ICD-10-CM | POA: Diagnosis present

## 2020-03-04 DIAGNOSIS — R4182 Altered mental status, unspecified: Secondary | ICD-10-CM

## 2020-03-04 DIAGNOSIS — F32A Depression, unspecified: Secondary | ICD-10-CM | POA: Diagnosis present

## 2020-03-04 DIAGNOSIS — R34 Anuria and oliguria: Secondary | ICD-10-CM | POA: Diagnosis not present

## 2020-03-04 DIAGNOSIS — Z87891 Personal history of nicotine dependence: Secondary | ICD-10-CM

## 2020-03-04 DIAGNOSIS — R188 Other ascites: Secondary | ICD-10-CM | POA: Diagnosis present

## 2020-03-04 DIAGNOSIS — R197 Diarrhea, unspecified: Secondary | ICD-10-CM | POA: Diagnosis present

## 2020-03-04 DIAGNOSIS — Z978 Presence of other specified devices: Secondary | ICD-10-CM

## 2020-03-04 DIAGNOSIS — R590 Localized enlarged lymph nodes: Secondary | ICD-10-CM | POA: Diagnosis present

## 2020-03-04 DIAGNOSIS — K7460 Unspecified cirrhosis of liver: Secondary | ICD-10-CM | POA: Diagnosis present

## 2020-03-04 DIAGNOSIS — I480 Paroxysmal atrial fibrillation: Secondary | ICD-10-CM | POA: Diagnosis present

## 2020-03-04 DIAGNOSIS — R748 Abnormal levels of other serum enzymes: Secondary | ICD-10-CM

## 2020-03-04 LAB — HIV ANTIBODY (ROUTINE TESTING W REFLEX): HIV Screen 4th Generation wRfx: NONREACTIVE

## 2020-03-04 LAB — CBC WITH DIFFERENTIAL/PLATELET
Abs Immature Granulocytes: 0.1 10*3/uL — ABNORMAL HIGH (ref 0.00–0.07)
Basophils Absolute: 0 10*3/uL (ref 0.0–0.1)
Basophils Relative: 0 %
Eosinophils Absolute: 0 10*3/uL (ref 0.0–0.5)
Eosinophils Relative: 0 %
HCT: 42.2 % (ref 36.0–46.0)
Hemoglobin: 12.8 g/dL (ref 12.0–15.0)
Immature Granulocytes: 1 %
Lymphocytes Relative: 4 %
Lymphs Abs: 0.5 10*3/uL — ABNORMAL LOW (ref 0.7–4.0)
MCH: 23.8 pg — ABNORMAL LOW (ref 26.0–34.0)
MCHC: 30.3 g/dL (ref 30.0–36.0)
MCV: 78.4 fL — ABNORMAL LOW (ref 80.0–100.0)
Monocytes Absolute: 0.5 10*3/uL (ref 0.1–1.0)
Monocytes Relative: 4 %
Neutro Abs: 11.1 10*3/uL — ABNORMAL HIGH (ref 1.7–7.7)
Neutrophils Relative %: 91 %
Platelets: 74 10*3/uL — ABNORMAL LOW (ref 150–400)
RBC: 5.38 MIL/uL — ABNORMAL HIGH (ref 3.87–5.11)
RDW: 23.3 % — ABNORMAL HIGH (ref 11.5–15.5)
WBC: 12.2 10*3/uL — ABNORMAL HIGH (ref 4.0–10.5)
nRBC: 0.2 % (ref 0.0–0.2)

## 2020-03-04 LAB — COMPREHENSIVE METABOLIC PANEL
ALT: 124 U/L — ABNORMAL HIGH (ref 0–44)
AST: 175 U/L — ABNORMAL HIGH (ref 15–41)
Albumin: 2.3 g/dL — ABNORMAL LOW (ref 3.5–5.0)
Alkaline Phosphatase: 221 U/L — ABNORMAL HIGH (ref 38–126)
Anion gap: 14 (ref 5–15)
BUN: 33 mg/dL — ABNORMAL HIGH (ref 8–23)
CO2: 32 mmol/L (ref 22–32)
Calcium: 9 mg/dL (ref 8.9–10.3)
Chloride: 97 mmol/L — ABNORMAL LOW (ref 98–111)
Creatinine, Ser: 1.14 mg/dL — ABNORMAL HIGH (ref 0.44–1.00)
GFR, Estimated: 53 mL/min — ABNORMAL LOW (ref >60)
Glucose, Bld: 99 mg/dL (ref 70–99)
Potassium: 2 mmol/L — CL (ref 3.5–5.1)
Sodium: 143 mmol/L (ref 135–145)
Total Bilirubin: 4 mg/dL — ABNORMAL HIGH (ref 0.3–1.2)
Total Protein: 5.1 g/dL — ABNORMAL LOW (ref 6.5–8.1)

## 2020-03-04 LAB — MAGNESIUM: Magnesium: 2.3 mg/dL (ref 1.7–2.4)

## 2020-03-04 LAB — BASIC METABOLIC PANEL
Anion gap: 13 (ref 5–15)
BUN: 35 mg/dL — ABNORMAL HIGH (ref 8–23)
CO2: 30 mmol/L (ref 22–32)
Calcium: 8.6 mg/dL — ABNORMAL LOW (ref 8.9–10.3)
Chloride: 99 mmol/L (ref 98–111)
Creatinine, Ser: 1.01 mg/dL — ABNORMAL HIGH (ref 0.44–1.00)
GFR, Estimated: >60 mL/min (ref >60)
Glucose, Bld: 83 mg/dL (ref 70–99)
Potassium: 2.6 mmol/L — CL (ref 3.5–5.1)
Sodium: 142 mmol/L (ref 135–145)

## 2020-03-04 LAB — RESP PANEL BY RT-PCR (FLU A&B, COVID) ARPGX2
Influenza A by PCR: NEGATIVE
Influenza B by PCR: NEGATIVE
SARS Coronavirus 2 by RT PCR: NEGATIVE

## 2020-03-04 LAB — CBG MONITORING, ED
Glucose-Capillary: 133 mg/dL — ABNORMAL HIGH (ref 70–99)
Glucose-Capillary: 84 mg/dL (ref 70–99)

## 2020-03-04 LAB — TSH: TSH: 1.523 u[IU]/mL (ref 0.350–4.500)

## 2020-03-04 LAB — HEPATITIS PANEL, ACUTE
HCV Ab: NONREACTIVE
Hep A IgM: NONREACTIVE
Hep B C IgM: NONREACTIVE
Hepatitis B Surface Ag: NONREACTIVE

## 2020-03-04 LAB — PHOSPHORUS: Phosphorus: 2.2 mg/dL — ABNORMAL LOW (ref 2.5–4.6)

## 2020-03-04 LAB — CK: Total CK: 123 U/L (ref 38–234)

## 2020-03-04 LAB — LIPASE, BLOOD: Lipase: 41 U/L (ref 11–51)

## 2020-03-04 MED ORDER — INSULIN ASPART PROT & ASPART (70-30 MIX) 100 UNIT/ML ~~LOC~~ SUSP
60.0000 [IU] | Freq: Two times a day (BID) | SUBCUTANEOUS | Status: DC
  Administered 2020-03-05: 60 [IU] via SUBCUTANEOUS
  Filled 2020-03-04: qty 10

## 2020-03-04 MED ORDER — ASCORBIC ACID 500 MG PO TABS
1000.0000 mg | ORAL_TABLET | Freq: Every day | ORAL | Status: DC
  Administered 2020-03-04 – 2020-03-10 (×7): 1000 mg via ORAL
  Filled 2020-03-04 (×7): qty 2

## 2020-03-04 MED ORDER — POTASSIUM & SODIUM PHOSPHATES 280-160-250 MG PO PACK
1.0000 | PACK | Freq: Three times a day (TID) | ORAL | Status: DC
  Filled 2020-03-04 (×3): qty 1

## 2020-03-04 MED ORDER — NICOTINE 7 MG/24HR TD PT24
7.0000 mg | MEDICATED_PATCH | Freq: Every day | TRANSDERMAL | Status: DC
  Administered 2020-03-04 – 2020-03-10 (×7): 7 mg via TRANSDERMAL
  Filled 2020-03-04 (×8): qty 1

## 2020-03-04 MED ORDER — SODIUM CHLORIDE 0.9 % IV SOLN: 250.0000 mL | INTRAVENOUS | Status: DC | PRN

## 2020-03-04 MED ORDER — POTASSIUM CHLORIDE 10 MEQ/100ML IV SOLN
10.0000 meq | INTRAVENOUS | Status: AC
Start: 2020-03-04 — End: 2020-03-05
  Administered 2020-03-04 – 2020-03-05 (×4): 10 meq via INTRAVENOUS
  Filled 2020-03-04 (×4): qty 100

## 2020-03-04 MED ORDER — POTASSIUM & SODIUM PHOSPHATES 280-160-250 MG PO PACK
1.0000 | PACK | Freq: Three times a day (TID) | ORAL | Status: AC
  Administered 2020-03-04 – 2020-03-05 (×3): 1 via ORAL
  Filled 2020-03-04 (×3): qty 1

## 2020-03-04 MED ORDER — ETHACRYNIC ACID 25 MG PO TABS
50.0000 mg | ORAL_TABLET | Freq: Two times a day (BID) | ORAL | Status: DC
  Administered 2020-03-05 (×2): 50 mg via ORAL
  Filled 2020-03-04 (×4): qty 2

## 2020-03-04 MED ORDER — GABAPENTIN 300 MG PO CAPS
300.0000 mg | ORAL_CAPSULE | Freq: Every day | ORAL | Status: DC
  Administered 2020-03-04 – 2020-03-10 (×7): 300 mg via ORAL
  Filled 2020-03-04 (×7): qty 1

## 2020-03-04 MED ORDER — IPRATROPIUM-ALBUTEROL 0.5-2.5 (3) MG/3ML IN SOLN
3.0000 mL | Freq: Four times a day (QID) | RESPIRATORY_TRACT | Status: DC
  Administered 2020-03-04 – 2020-03-11 (×23): 3 mL via RESPIRATORY_TRACT
  Filled 2020-03-04 (×7): qty 3
  Filled 2020-03-04: qty 6
  Filled 2020-03-04 (×14): qty 3

## 2020-03-04 MED ORDER — APIXABAN 5 MG PO TABS
5.0000 mg | ORAL_TABLET | Freq: Two times a day (BID) | ORAL | Status: DC
  Administered 2020-03-04 – 2020-03-06 (×5): 5 mg via ORAL
  Filled 2020-03-04 (×6): qty 1

## 2020-03-04 MED ORDER — LISINOPRIL 10 MG PO TABS
10.0000 mg | ORAL_TABLET | Freq: Two times a day (BID) | ORAL | Status: DC
  Administered 2020-03-04 – 2020-03-10 (×12): 10 mg via ORAL
  Filled 2020-03-04 (×13): qty 1

## 2020-03-04 MED ORDER — SERTRALINE HCL 50 MG PO TABS
100.0000 mg | ORAL_TABLET | Freq: Every day | ORAL | Status: DC
  Administered 2020-03-04 – 2020-03-10 (×7): 100 mg via ORAL
  Filled 2020-03-04 (×7): qty 1

## 2020-03-04 MED ORDER — INSULIN ASPART 100 UNIT/ML ~~LOC~~ SOLN
0.0000 [IU] | Freq: Three times a day (TID) | SUBCUTANEOUS | Status: DC
  Administered 2020-03-05 (×2): 2 [IU] via SUBCUTANEOUS
  Administered 2020-03-05: 3 [IU] via SUBCUTANEOUS
  Administered 2020-03-06: 2 [IU] via SUBCUTANEOUS
  Administered 2020-03-07 (×2): 3 [IU] via SUBCUTANEOUS
  Administered 2020-03-07 – 2020-03-08 (×2): 2 [IU] via SUBCUTANEOUS
  Administered 2020-03-08 – 2020-03-09 (×3): 3 [IU] via SUBCUTANEOUS

## 2020-03-04 MED ORDER — PROBIOTIC PO CAPS: 1.0000 | ORAL_CAPSULE | Freq: Every day | ORAL | Status: DC

## 2020-03-04 MED ORDER — ADULT MULTIVITAMIN W/MINERALS CH
1.0000 | ORAL_TABLET | Freq: Every day | ORAL | Status: DC
  Administered 2020-03-04 – 2020-03-10 (×7): 1 via ORAL
  Filled 2020-03-04 (×7): qty 1

## 2020-03-04 MED ORDER — SODIUM CHLORIDE 0.9 % IV BOLUS
500.0000 mL | Freq: Once | INTRAVENOUS | Status: AC
  Administered 2020-03-04: 500 mL via INTRAVENOUS

## 2020-03-04 MED ORDER — ACETAMINOPHEN 325 MG PO TABS
650.0000 mg | ORAL_TABLET | ORAL | Status: DC | PRN
  Administered 2020-03-09: 650 mg via ORAL
  Filled 2020-03-04: qty 2

## 2020-03-04 MED ORDER — SODIUM CHLORIDE 0.9% FLUSH
3.0000 mL | Freq: Two times a day (BID) | INTRAVENOUS | Status: DC
  Administered 2020-03-04 – 2020-03-10 (×13): 3 mL via INTRAVENOUS

## 2020-03-04 MED ORDER — CLOBETASOL PROPIONATE 0.05 % EX OINT: 1.0000 "application " | TOPICAL_OINTMENT | Freq: Two times a day (BID) | CUTANEOUS | Status: DC | PRN

## 2020-03-04 MED ORDER — POTASSIUM CHLORIDE CRYS ER 20 MEQ PO TBCR
40.0000 meq | EXTENDED_RELEASE_TABLET | Freq: Once | ORAL | Status: AC
  Administered 2020-03-04: 40 meq via ORAL
  Filled 2020-03-04: qty 2

## 2020-03-04 MED ORDER — SODIUM CHLORIDE 0.9% FLUSH
3.0000 mL | INTRAVENOUS | Status: DC | PRN
  Administered 2020-03-05 (×2): 3 mL via INTRAVENOUS

## 2020-03-04 MED ORDER — ONDANSETRON HCL 4 MG/2ML IJ SOLN
4.0000 mg | Freq: Four times a day (QID) | INTRAMUSCULAR | Status: DC | PRN
  Administered 2020-03-10: 4 mg via INTRAVENOUS
  Filled 2020-03-04: qty 2

## 2020-03-04 MED ORDER — FLUTICASONE FUROATE-VILANTEROL 100-25 MCG/INH IN AEPB
1.0000 | INHALATION_SPRAY | Freq: Every day | RESPIRATORY_TRACT | Status: DC
  Administered 2020-03-06 – 2020-03-10 (×6): 1 via RESPIRATORY_TRACT
  Filled 2020-03-04 (×2): qty 28

## 2020-03-04 MED ORDER — ALBUMIN HUMAN 25 % IV SOLN
12.5000 g | Freq: Four times a day (QID) | INTRAVENOUS | Status: AC
  Administered 2020-03-04 – 2020-03-07 (×11): 12.5 g via INTRAVENOUS
  Filled 2020-03-04 (×16): qty 50

## 2020-03-04 MED ORDER — ALBUTEROL SULFATE HFA 108 (90 BASE) MCG/ACT IN AERS
2.0000 | INHALATION_SPRAY | Freq: Four times a day (QID) | RESPIRATORY_TRACT | Status: DC | PRN
  Filled 2020-03-04: qty 6.7

## 2020-03-04 MED ORDER — POTASSIUM CHLORIDE 10 MEQ/100ML IV SOLN
10.0000 meq | INTRAVENOUS | Status: AC
Start: 2020-03-04 — End: 2020-03-04
  Administered 2020-03-04 (×3): 10 meq via INTRAVENOUS
  Filled 2020-03-04 (×3): qty 100

## 2020-03-04 NOTE — ED Notes (Signed)
Off floor to ct

## 2020-03-04 NOTE — ED Notes (Signed)
Cap refill > 3 sec, hands cold but afebrile in range 98.2.
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
## 2020-03-04 NOTE — H&P (Signed)
History and Physical    [name] [name] CBS:[id number] DOB: 1956-01-03 DOA: 02/22/2020  PCP: Jacelyn Pi, MD (Confirm with patient/family/NH records and if not entered, this has to be entered at [facility] point of entry) Patient coming from: Home  I have personally briefly reviewed patient's old medical records in [place]  Chief Complaint: Feeling tired, shortness of breath, leg swelling  HPI: [name] [name] is a 64 y.o. female with medical history significant of COPD, PVD status post bilateral femoropopliteal bypass, chronic diastolic CHF, IDDM, paroxysmal A. Fib on Eliquis,, morbid obesity, chronic hilar lymphadenopathy, presented with worsening of generalized weakness, shortness of breath and increasing leg swelling.  Patient was hospitalized August 2021 for worsening of right lower extremity ischemia, underwent right-sided femoropopliteal bypass.  During that admission, it was found patient had COPD, patient underwent lung function test 2 months later showed FEV1 88% and FEV1/FVC 76%, accidentally, there is right hilar lymphadenopathy found on the CT angiogram.  Since her PVD surgery in August, patient started to have worsening of leg swelling, appears to be more severe on the right side, and her primary/endocrinology started her on Lasix about 3 weeks ago.  Last week she was seen by pulmonology for abdominal potassium level and for 40 mEq potassium supplement was started in addition to Lasix.  Patient however describe although there is some improvement of leg swelling but overall she feels very tired and has not been able to walk as much as before.  Outpatient lab also showed a worsening of elevation LFT in last 3 weeks which was concerning.  Patient reported she has been having some " annoying discomfort" on epigastric area associated with nauseous specifically after eating meals, she denied constantly use Tylenol or NSAIDs.  She stopped drinking many years ago and she has been on  nicotine patch since last month.  ED Course: Elevated LFTs, CT abdomen showed fatty liver but no CBD issue.  Review of Systems: As per HPI otherwise 14 point review of systems negative.    Past Medical History:  Diagnosis Date  . Abdominal abscess   . Allergic rhinitis   . Arthritis    Hip and neck  . Asthma   . Bacteremia 03/2017  . Carotid stenosis, left 04/2009   50-69%  . Colon polyps 12/05  . COPD (chronic obstructive pulmonary disease) ([place])   . Depression   . Diabetes mellitus    followed by Dr. Chalmers Cater- type II  . Dyspnea    with exertion   . Folate deficiency   . Hyperlipidemia   . Hypertension   . Lumbar disc disease    h/o hnp with repair  . Obesity, morbid (more than 100 lbs over ideal weight or BMI > 40) (HCC)   . Peripheral vascular disease ([place])   . Psoriasis   . Thrombocytopenia ([place])    work up with Dr. Griffith Citron neg in 2000  . Tobacco abuse     Past Surgical History:  Procedure Laterality Date  . ABDOMINAL AORTOGRAM W/LOWER EXTREMITY N/A 02/28/2017   Procedure: ABDOMINAL AORTOGRAM W/LOWER EXTREMITY;  Surgeon: Serafina Mitchell, MD;  Location: [place] [facility];  Service: Cardiovascular;  Laterality: N/A;  Bilateral  . ABDOMINAL AORTOGRAM W/LOWER EXTREMITY N/A 10/29/2019   Procedure: ABDOMINAL AORTOGRAM W/LOWER EXTREMITY;  Surgeon: Serafina Mitchell, MD;  Location: [place] [facility];  Service: Cardiovascular;  Laterality: N/A;  . ANGIOPLASTY Right 10/30/2019   Procedure: RIGHT LEG BALLOON  ANGIOPLASTY SFA;  Surgeon: Serafina Mitchell,  MD;  Location: [place];  Service: Vascular;  Laterality: Right;  . BACK SURGERY  1989   LS  for HNP  . CHOLECYSTECTOMY  1982  . ENDARTERECTOMY Left 06/30/2017   Procedure: ENDARTERECTOMY CAROTID LEFT;  Surgeon: Serafina Mitchell, MD;  Location: [place];  Service: Vascular;  Laterality: Left;  . FEMORAL-POPLITEAL BYPASS GRAFT Right 04/07/2017   Procedure: RIGHT FEMORAL-BELOW POPLITEAL ARTERY BYPASS WITH VEIN, RIGHT COMMON AND PRFUNDA  FEMORAL ARTERY ENDARTERECTOMY WITH VEIN PATCH ANGIOPLASTY;  Surgeon: Serafina Mitchell, MD;  Location: [place];  Service: Vascular;  Laterality: Right;  . FEMORAL-POPLITEAL BYPASS GRAFT Left 10/30/2019   Procedure: BYPASS GRAFT FEMORAL-POPLITEAL ARTERY;  Surgeon: Serafina Mitchell, MD;  Location: [place];  Service: Vascular;  Laterality: Left;  . HERNIA REPAIR  2001   incisional  . INCISE AND DRAIN ABCESS  01/11/11   abd abscess     reports that she quit smoking about 4 months ago. Her smoking use included cigarettes. She has a 94.00 pack-year smoking history. She has never used smokeless tobacco. She reports that she does not drink alcohol and does not use drugs.  Allergies  Allergen Reactions  . Iohexol Rash    Pt states she had a rash 35-40 yrs ago during a procedure.  Benadryl was given and pt was fine in 30 mins. She has had multiple CT's since with premeds and has done fine.  No anaphylaxis per pt. I updated this record. [name], RTRCT  03/06/17  . Penicillins Rash and Other (See Comments)    PATIENT HAS HAD A PCN REACTION WITH IMMEDIATE RASH, FACIAL/TONGUE/THROAT SWELLING, SOB, OR LIGHTHEADEDNESS WITH HYPOTENSION:  #  #  YES  #  #  Has patient had a PCN reaction causing severe rash involving mucus membranes or skin necrosis: No Has patient had a PCN reaction that required hospitalization: No Has patient had a PCN reaction occurring within the last 10 years: No Can take amoxicillin     . Levaquin [Levofloxacin] Hives    rash  . Morphine And Related Nausea And Vomiting    Makes her crawl out of her skin  . Sulfa Antibiotics Rash    Family History  Problem Relation Age of Onset  . Asthma Mother   . Hypertension Mother   . Stroke Father   . Alcohol abuse Father   . Cancer Sister        brain stem tumor  . Suicidality Sister   . Cancer Sister        breast  . Suicidality Brother   . Dementia Sister      Prior to Admission medications   Medication Sig Start Date End Date  Taking? Authorizing Provider  albuterol (PROVENTIL HFA;VENTOLIN HFA) 108 (90 BASE) MCG/ACT inhaler Inhale 2 puffs into the lungs every 6 (six) hours as needed for wheezing.    [provider]  apixaban (ELIQUIS) 5 MG TABS tablet Take 1 tablet (5 mg total) by mouth 2 (two) times daily. 11/16/19   Baglia, Corrina, PA-C  Ascorbic Acid (VITAMIN C) 1000 MG tablet Take 1,000 mg by mouth daily.    [provider]  atorvastatin (LIPITOR) 20 MG tablet Take 1 tablet (20 mg total) by mouth at bedtime. Patient taking differently: Take 20 mg by mouth daily.  04/10/17   Rai, Vernelle Emerald, MD  Cholecalciferol (VITAMIN D3) 1000 UNITS CAPS Take 1,000 Units by mouth 2 (two) times daily.     [provider]  clobetasol ointment (TEMOVATE) 0.05 %  Apply 1 application topically 2 (two) times daily as needed (psoriasis).  05/15/17   [provider]  furosemide (LASIX) 40 MG tablet Take 40 mg by mouth.    [provider]  gabapentin (NEURONTIN) 300 MG capsule Take 1 capsule (300 mg total) by mouth 3 (three) times daily. 04/10/17   Rai, Ripudeep K, MD  hydrochlorothiazide (HYDRODIURIL) 25 MG tablet TAKE 1 TABLET DAILY Patient taking differently: Take 25 mg by mouth daily.     Schoenhoff, Altamese Cabal, MD  insulin NPH-regular Human (NOVOLIN 70/30) (70-30) 100 UNIT/ML injection Inject 60 Units into the skin 2 (two) times daily.     [provider]  lisinopril (ZESTRIL) 10 MG tablet Take 10 mg by mouth 2 (two) times daily. 10/25/19   [provider]  Multiple Vitamin (MULTIVITAMIN WITH MINERALS) TABS tablet Take 1 tablet by mouth daily.    [provider]  Multiple Vitamins-Minerals (ZINC PO) Take 1 tablet by mouth daily.    [provider]  naproxen sodium (ALEVE) 220 MG tablet Take 220 mg by mouth 2 (two) times daily as needed (pain/headaches). Patient not taking: Reported on 02/24/2020    [provider]  OVER THE COUNTER MEDICATION Take 81 mg by  mouth daily. Asprin    [provider]  Potassium Chloride ER 20 MEQ TBCR Take 2 tablets now and 2 tablets this evening.  Take 2 tablets daily starting tomorrow 02/25/2020. 02/24/20   Freddi Starr, MD  Probiotic CAPS Take 1 capsule by mouth daily.    [provider]  sertraline (ZOLOFT) 100 MG tablet Take 100 mg by mouth once daily Patient taking differently: Take 100 mg by mouth daily.  04/10/17   Mendel Corning, MD    Physical Exam: Vitals:   03/20/2020 1015 03/06/2020 1030 03/05/2020 1200 03/03/2020 1300  BP: (!) 118/52 110/85 126/65 (!) 124/42  Pulse: 95 97 (!) 104 (!) 104  Resp: 17 (!) 21 16 17   Temp:      TempSrc:      SpO2: 98% 96% 97% 91%  Weight:      Height:        Constitutional: NAD, calm, comfortable Vitals:   02/22/2020 1015 03/08/2020 1030 03/07/2020 1200 02/28/2020 1300  BP: (!) 118/52 110/85 126/65 (!) 124/42  Pulse: 95 97 (!) 104 (!) 104  Resp: 17 (!) 21 16 17   Temp:      TempSrc:      SpO2: 98% 96% 97% 91%  Weight:      Height:       Eyes: PERRL, lids and conjunctivae normal ENMT: Mucous membranes are moist. Posterior pharynx clear of any exudate or lesions.Normal dentition.  Neck: normal, supple, no masses, no thyromegaly Respiratory: clear to auscultation bilaterally, diffused wheezing no crackles.  Increasing respiratory effort. No accessory muscle use.  Cardiovascular: Regular rate and rhythm, no murmurs / rubs / gallops.  2+ extremity edema. 2+ pedal pulses. No carotid bruits.  Abdomen: no tenderness, no masses palpated. No hepatosplenomegaly. Bowel sounds positive.  Musculoskeletal: no clubbing / cyanosis. No joint deformity upper and lower extremities. Good ROM, no contractures. Normal muscle tone.  Skin: no rashes, lesions, ulcers. No induration Neurologic: CN 2-12 grossly intact. Sensation intact, DTR normal. Strength 5/5 in all 4.  Psychiatric: Normal judgment and insight. Alert and oriented x 3. Normal mood.     Labs on Admission: I  have personally reviewed following labs and imaging studies  CBC: Recent Labs  Lab 02/19/2020 0800  WBC 12.2*  NEUTROABS 11.1*  HGB 12.8  HCT 42.2  MCV 78.4*  PLT 74*   Basic Metabolic Panel: Recent Labs  Lab 03/02/20 1535 03/16/2020 0800 03/10/2020 1000  NA 143 143  --   K 2.4* 2.0*  --   CL 95* 97*  --   CO2 34* 32  --   GLUCOSE 133* 99  --   BUN 41* 33*  --   CREATININE 1.01 1.14*  --   CALCIUM 8.6 9.0  --   MG  --  2.3  --   PHOS  --   --  2.2*   GFR: Estimated Creatinine Clearance: 64.4 mL/min (A) (by C-G formula based on SCr of 1.14 mg/dL (H)). Liver Function Tests: Recent Labs  Lab 03/02/20 1535 02/20/2020 0800  AST 115* 175*  ALT 106* 124*  ALKPHOS 223* 221*  BILITOT 3.1* 4.0*  PROT 5.3* 5.1*  ALBUMIN 2.9* 2.3*   Recent Labs  Lab 03/20/2020 0800  LIPASE 41   No results for input(s): AMMONIA in the last 168 hours. Coagulation Profile: No results for input(s): INR, PROTIME in the last 168 hours. Cardiac Enzymes: No results for input(s): CKTOTAL, CKMB, CKMBINDEX, TROPONINI in the last 168 hours. BNP (last 3 results) No results for input(s): PROBNP in the last 8760 hours. HbA1C: No results for input(s): HGBA1C in the last 72 hours. CBG: No results for input(s): GLUCAP in the last 168 hours. Lipid Profile: No results for input(s): CHOL, HDL, LDLCALC, TRIG, CHOLHDL, LDLDIRECT in the last 72 hours. Thyroid Function Tests: Recent Labs    03/05/2020 0800  TSH 1.523   Anemia Panel: No results for input(s): VITAMINB12, FOLATE, FERRITIN, TIBC, IRON, RETICCTPCT in the last 72 hours. Urine analysis:    Component Value Date/Time   COLORURINE YELLOW 10/31/2019 0133   APPEARANCEUR HAZY (A) 10/31/2019 0133   LABSPEC 1.021 10/31/2019 0133   PHURINE 6.0 10/31/2019 0133   GLUCOSEU NEGATIVE 10/31/2019 0133   HGBUR MODERATE (A) 10/31/2019 0133   BILIRUBINUR NEGATIVE 10/31/2019 0133   BILIRUBINUR neg 01/28/2013 1327   KETONESUR NEGATIVE 10/31/2019 0133    PROTEINUR NEGATIVE 10/31/2019 0133   UROBILINOGEN negative 01/28/2013 1327   UROBILINOGEN 1.0 08/09/2007 0200   NITRITE NEGATIVE 10/31/2019 0133   LEUKOCYTESUR SMALL (A) 10/31/2019 0133    Radiological Exams on Admission: CT ABDOMEN PELVIS WO CONTRAST  Result Date: 03/03/2020 CLINICAL DATA:  Elevated LFTs with diffuse abdominal pain EXAM: CT ABDOMEN AND PELVIS WITHOUT CONTRAST TECHNIQUE: Multidetector CT imaging of the abdomen and pelvis was performed following the standard protocol without IV contrast. COMPARISON:  06/05/2012, CT of the chest from 11/03/2019 FINDINGS: Lower chest: Lung bases are free of acute infiltrate or sizable effusion. There are however changes consistent with right hilar lymphadenopathy measuring 4.6 x 3.6 cm increased in size from the prior CT of the chest. Additionally subcarinal adenopathy is noted which is incompletely evaluated measuring 3.9 cm in short axis. This is also increased from the prior study. Hepatobiliary: Gallbladder has been surgically removed. Liver demonstrates fatty infiltration without focal definitive mass. Mild perihepatic ascites is seen. Pancreas: Unremarkable. No pancreatic ductal dilatation or surrounding inflammatory changes. Spleen: Normal in size without focal abnormality. Adrenals/Urinary Tract: Adrenal glands are within normal limits. Kidneys demonstrate renal vascular calcifications although no obstructive changes are seen. The ureters are within normal limits. The bladder is partially distended. Stomach/Bowel: Scattered diverticular change of the colon is noted without obstructive change. The appendix is within normal limits. No obstructive changes are noted  in the small bowel. The stomach is decompressed. Anterior abdominal wall hernia is identified containing loops of bowel although no incarceration is noted. These findings are stable from the prior exam. Vascular/Lymphatic: Aortic atherosclerosis. No enlarged abdominal or pelvic lymph nodes.  Reproductive: Uterus and bilateral adnexa are unremarkable. IUD is noted in place. Other: Mild ascites is seen in the pelvis and surrounding the liver and spleen. This is new from the prior exam from August of this year. Mild changes of anasarca are noted as well. Musculoskeletal: Degenerative changes of lumbar spine are noted. Compression deformity of L3 is noted progressed in the interval from 2014. IMPRESSION: Significant increase in right hilar and subcarinal lymphadenopathy when compared with the recent CT of the chest from August of 2021. CT of the chest ideally with contrast material is recommended for further evaluation. Fatty liver. New mild ascites and changes of anasarca Anterior abdominal wall hernia similar to that seen in 2014 containing loops of bowel without evidence of incarceration. Diverticulosis without diverticulitis. Electronically Signed   By: Inez Catalina M.D.   On: 03/15/2020 11:38   DG Chest Port 1 View  Result Date: 02/23/2020 CLINICAL DATA:  Shortness of breath. EXAM: PORTABLE CHEST 1 VIEW COMPARISON:  January 23, 2020 FINDINGS: Lungs are clear. Heart size and pulmonary vascularity are normal. No adenopathy. There is aortic atherosclerosis. No bone lesions. IMPRESSION: Lungs clear.  Stable cardiac silhouette. Aortic Atherosclerosis (ICD10-I70.0). Electronically Signed   By: Lowella Grip III M.D.   On: 03/06/2020 08:55    EKG: Independently reviewed.  Chronic RBBB  Assessment/Plan Active Problems:   Hypokalemia  (please populate well all problems here in Problem List. (For example, if patient is on BP meds at home and you resume or decide to hold them, it is a problem that needs to be her. Same for CAD, COPD, HLD and so on)  Hypokalemia -Severe, secondary to Lasix use, received IV and p.o. replacement, will check potassium repeat this afternoon and tomorrow  Acute liver damage hypoalbuminemia, acute transaminitis and bilirubinemia -Appears to be related to  Lasix. -Stop Lasix -CT abdomen reassuring, GI was contacted by ED physician, hold statin -Albumin infusion Q6H x 3 days, hydration and enhance diuresis (will restart diuresis with ethacrynic acid tomorrow) -Trend LFTs daily  Acute COPD exacerbation -Appears patient not compliant with breathing meds, will restart DuoNeb and Breo  Acute on chronic diastolic CHF decompensation -Fluid overload, need diuresis, will start ethacrynic acid given the patient likely has Lasix reaction. -Albumin to enhance loop diuresis as mentioned above.  IDDM -Restart home insulin regimen  HTN -Appears to be controlled, will hold HCTZ given patient will be on loop.  Morbid obesity -Consult dietitian -PT evaluation  Thrombocytopenia -No obvious bleeding, H&H stable, repeat CBC tomorrow  PAF -Sinus rhythm, continue Eliquis  DVT prophylaxis: Eliquis Code Status: Full code Family Communication: None at bedside Disposition Plan: Expect more than 2 midnight hospital stay to chronic potassium level and monitor liver function as well as restart diuresis and treating decompensated CHF and COPD. Consults called: GI Admission status: Telemetry admission   Lequita Halt MD [facility] Pager [phone]  03/03/2020, 2:29 PM

## 2020-03-04 NOTE — ED Notes (Signed)
Pt moved to Room 40; report given to [place], [place].

## 2020-03-04 NOTE — Consult Note (Signed)
Reason for Consult: Evaded liver enzymes and diarrhea. Referring Physician: [facility] hospital[facility].  [name] [name] is an 64 y.o. female.  HPI: [name] [name] is a 63 year old morbidly obese white female with multiple medical problems listed below presents with emergency room today history of generalized weakness along with shortness of breath and increasing swelling of her lower limbs.  Patient was recently hospitalized in August of this year for worsening right right lower extremity edema and underwent right femoropopliteal bypass at that time.  She gives a history of having 2 loose BMs per day.  She claims she had some tomato soup earlier today and noticed some red color in her stools but denies having any melena or hematochezia prior to this. She had some epigastric pain but denies having any nausea vomiting or hematemesis.  She claims she stopped drinking alcohol many years ago and quit smoking 5 months ago.  She smoked a pack to 2 packs/day for the last 40 years till she quit smoking 5 months ago.  According to her records in our office her last colonoscopy was done in 2006 when she was noted to have a few scattered sigmoid diverticula.  She had a noncontrasted CT done today that revealed fatty liver with evidence of diverticulosis without any diverticulitis, mild perihepatic ascites with an anterior abdominal wall hernia containing loops of bowel without any evidence of incarceration; in addition degenerative disc disease is noted in the lower back with a deformity of the L3.  There was significant increase in the right hilar and subcarinal lymphadenopathy can compared to previous CT done in August of this year. Patient is asking as to when her repeat colonoscopy will be done as she has not had one since 2006.  She denies a family history of colon cancer.  She claims her weight has steadily gone up over the years and she currently weighs 280 pounds.  She has had some elevated LFTs with an alkaline  phosphatase of 221 and AST of 175 ALT of 124 total bili of 4 which is up from 1.6 on 6 December this year.  She had a cholecystectomy in the past.  She has a history of thrombocytopenia which seems more significant on this admission with platelet count at 74,000.  Her last count in September of this year was noted to be 211 K.  Patient was found to have severe hypokalemia related to the use of Lasix and is currently receiving IV potassium.   Past Medical History:  Diagnosis Date  . Abdominal abscess   . Allergic rhinitis   . Arthritis    Hip and neck  . Asthma   . Bacteremia 03/2017  . Carotid stenosis, left 04/2009   50-69%  . Colon polyps 12/05  . COPD (chronic obstructive pulmonary disease) ([place])   . Depression   . Diabetes mellitus    followed by Dr. Chalmers Cater- type II  . Dyspnea    with exertion   . Folate deficiency   . Hyperlipidemia   . Hypertension   . Lumbar disc disease    h/o hnp with repair  . Obesity, morbid (more than 100 lbs over ideal weight or BMI > 40) ([facility])   . Peripheral vascular disease ([place])   . Psoriasis   . Thrombocytopenia ([place])    work up with Dr. Griffith Citron neg in 2000  . Tobacco abuse    Past Surgical History:  Procedure Laterality Date  . ABDOMINAL AORTOGRAM W/LOWER EXTREMITY N/A 02/28/2017   Procedure: ABDOMINAL AORTOGRAM W/LOWER  EXTREMITY;  Surgeon: Serafina Mitchell, MD;  Location: [facility];  Service: Cardiovascular;  Laterality: N/A;  Bilateral  . ABDOMINAL AORTOGRAM W/LOWER EXTREMITY N/A 10/29/2019   Procedure: ABDOMINAL AORTOGRAM W/LOWER EXTREMITY;  Surgeon: Serafina Mitchell, MD;  Location: [facility];  Service: Cardiovascular;  Laterality: N/A;  . ANGIOPLASTY Right 10/30/2019   Procedure: RIGHT LEG BALLOON  ANGIOPLASTY SFA;  Surgeon: Serafina Mitchell, MD;  Location: [facility];  Service: Vascular;  Laterality: Right;  . BACK SURGERY  1989   LS  for HNP  . CHOLECYSTECTOMY  1982  . ENDARTERECTOMY Left 06/30/2017   Procedure:  ENDARTERECTOMY CAROTID LEFT;  Surgeon: Serafina Mitchell, MD;  Location: [facility];  Service: Vascular;  Laterality: Left;  . FEMORAL-POPLITEAL BYPASS GRAFT Right 04/07/2017   Procedure: RIGHT FEMORAL-BELOW POPLITEAL ARTERY BYPASS WITH VEIN, RIGHT COMMON AND PRFUNDA FEMORAL ARTERY ENDARTERECTOMY WITH VEIN PATCH ANGIOPLASTY;  Surgeon: Serafina Mitchell, MD;  Location: [facility];  Service: Vascular;  Laterality: Right;  . FEMORAL-POPLITEAL BYPASS GRAFT Left 10/30/2019   Procedure: BYPASS GRAFT FEMORAL-POPLITEAL ARTERY;  Surgeon: Serafina Mitchell, MD;  Location: [facility];  Service: Vascular;  Laterality: Left;  . HERNIA REPAIR  2001   incisional  . INCISE AND DRAIN ABCESS  01/11/11   abd abscess   Family History  Problem Relation Age of Onset  . Asthma Mother   . Hypertension Mother   . Stroke Father   . Alcohol abuse Father   . Cancer Sister        brain stem tumor  . Suicidality Sister   . Cancer Sister        breast  . Suicidality Brother   . Dementia Sister    Social History:  reports that she quit smoking about 4 months ago. Her smoking use included cigarettes. She has a 94.00 pack-year smoking history. She has never used smokeless tobacco. She reports that she does not drink alcohol and does not use drugs.  Allergies:  Allergies  Allergen Reactions  . Iohexol Rash    Pt states she had a rash 35-40 yrs ago during a procedure.  Benadryl was given and pt was fine in 30 mins. She has had multiple CT's since with premeds and has done fine.  No anaphylaxis per pt. I updated this record. [name], RTRCT  03/06/17  . Penicillins Rash and Other (See Comments)    PATIENT HAS HAD A PCN REACTION WITH IMMEDIATE RASH, FACIAL/TONGUE/THROAT SWELLING, SOB, OR LIGHTHEADEDNESS WITH HYPOTENSION:  #  #  YES  #  #  Has patient had a PCN reaction causing severe rash involving mucus membranes or skin necrosis: No Has patient had a PCN reaction that required hospitalization: No Has patient had a PCN reaction  occurring within the last 10 years: No Can take amoxicillin     . Levaquin [Levofloxacin] Hives and Rash  . Morphine And Related Nausea And Vomiting    Makes her crawl out of her skin  . Sulfa Antibiotics Rash   Medications: I have reviewed the patient's current medications.  Results for orders placed or performed during the hospital encounter of 03/09/2020 (from the past 48 hour(s))  TSH     Status: None   Collection Time: 02/22/2020  8:00 AM  Result Value Ref Range   TSH 1.523 0.350 - 4.500 uIU/mL    Comment: Performed by a 3rd Generation assay with a functional sensitivity of <=0.01 uIU/mL. Performed at [facility], [place]  [address] [place], [place] [address]   CBC with Differential/Platelet     Status: Abnormal   Collection Time: 03/03/2020  8:00 AM  Result Value Ref Range   WBC 12.2 (H) 4.0 - 10.5 K/uL   RBC 5.38 (H) 3.87 - 5.11 MIL/uL   Hemoglobin 12.8 12.0 - 15.0 g/dL   HCT 42.2 36.0 - 46.0 %   MCV 78.4 (L) 80.0 - 100.0 fL   MCH 23.8 (L) 26.0 - 34.0 pg   MCHC 30.3 30.0 - 36.0 g/dL   RDW 23.3 (H) 11.5 - 15.5 %   Platelets 74 (L) 150 - 400 K/uL    Comment: REPEATED TO VERIFY PLATELET COUNT CONFIRMED BY SMEAR SPECIMEN CHECKED FOR CLOTS Immature Platelet Fraction may be clinically indicated, consider ordering this additional test [id number]    nRBC 0.2 0.0 - 0.2 %   Neutrophils Relative % 91 %   Neutro Abs 11.1 (H) 1.7 - 7.7 K/uL   Lymphocytes Relative 4 %   Lymphs Abs 0.5 (L) 0.7 - 4.0 K/uL   Monocytes Relative 4 %   Monocytes Absolute 0.5 0.1 - 1.0 K/uL   Eosinophils Relative 0 %   Eosinophils Absolute 0.0 0.0 - 0.5 K/uL   Basophils Relative 0 %   Basophils Absolute 0.0 0.0 - 0.1 K/uL   Immature Granulocytes 1 %   Abs Immature Granulocytes 0.10 (H) 0.00 - 0.07 K/uL   Polychromasia PRESENT     Comment: Performed at [facility] [address] [place], [place] [address]  Comprehensive metabolic panel     Status: Abnormal   Collection Time: 02/21/2020   8:00 AM  Result Value Ref Range   Sodium 143 135 - 145 mmol/L   Potassium 2.0 (LL) 3.5 - 5.1 mmol/L    Comment: CRITICAL RESULT CALLED TO, READ BACK BY AND VERIFIED WITH: Cindy Hazy RN [phone] BY A BENNETT    Chloride 97 (L) 98 - 111 mmol/L   CO2 32 22 - 32 mmol/L   Glucose, Bld 99 70 - 99 mg/dL    Comment: Glucose reference range applies only to samples taken after fasting for at least 8 hours.   BUN 33 (H) 8 - 23 mg/dL   Creatinine, Ser 1.14 (H) 0.44 - 1.00 mg/dL   Calcium 9.0 8.9 - 10.3 mg/dL   Total Protein 5.1 (L) 6.5 - 8.1 g/dL   Albumin 2.3 (L) 3.5 - 5.0 g/dL   AST 175 (H) 15 - 41 U/L   ALT 124 (H) 0 - 44 U/L   Alkaline Phosphatase 221 (H) 38 - 126 U/L   Total Bilirubin 4.0 (H) 0.3 - 1.2 mg/dL   GFR, Estimated 53 (L) >60 mL/min    Comment: (NOTE) Calculated using the CKD-EPI Creatinine Equation (2021)    Anion gap 14 5 - 15    Comment: Performed at [facility], [place] [address], [place], [place] [address]  Lipase, blood     Status: None   Collection Time: 03/03/2020  8:00 AM  Result Value Ref Range   Lipase 41 11 - 51 U/L    Comment: Performed at [facility], [place] [address] [place], [place] [address]  Magnesium     Status: None   Collection Time: 03/06/2020  8:00 AM  Result Value Ref Range   Magnesium 2.3 1.7 - 2.4 mg/dL    Comment: Performed at [facility], [place] [address], [place], [place] [address]  Resp Panel by RT-PCR (Flu  A&B, Covid) Nasopharyngeal Swab     Status: None   Collection Time: 02/26/2020  8:27 AM   Specimen: Nasopharyngeal Swab; Nasopharyngeal(NP) swabs in vial transport medium  Result Value Ref Range   SARS Coronavirus 2 by RT PCR NEGATIVE NEGATIVE    Comment: (NOTE) SARS-CoV-2 target nucleic acids are NOT DETECTED.  The SARS-CoV-2 RNA is generally detectable in upper respiratory specimens during the acute phase of infection. The lowest concentration of SARS-CoV-2 viral copies this assay can detect is 138 copies/mL. A negative  result does not preclude SARS-Cov-2 infection and should not be used as the sole basis for treatment or other patient management decisions. A negative result may occur with  improper specimen collection/handling, submission of specimen other than nasopharyngeal swab, presence of viral mutation(s) within the areas targeted by this assay, and inadequate number of viral copies(<138 copies/mL). A negative result must be combined with clinical observations, patient history, and epidemiological information. The expected result is Negative.  Fact Sheet for Patients:  EntrepreneurPulse.com.au  Fact Sheet for Healthcare Providers:  IncredibleEmployment.be  This test is no t yet approved or cleared by the [place] FDA and  has been authorized for detection and/or diagnosis of SARS-CoV-2 by FDA under an Emergency Use Authorization (EUA). This EUA will remain  in effect (meaning this test can be used) for the duration of the COVID-19 declaration under Section 564(b)(1) of the Act, 21 U.S.C.section 360bbb-3(b)(1), unless the authorization is terminated  or revoked sooner.       Influenza A by PCR NEGATIVE NEGATIVE   Influenza B by PCR NEGATIVE NEGATIVE    Comment: (NOTE) The Xpert Xpress SARS-CoV-2/FLU/RSV plus assay is intended as an aid in the diagnosis of influenza from Nasopharyngeal swab specimens and should not be used as a sole basis for treatment. Nasal washings and aspirates are unacceptable for Xpert Xpress SARS-CoV-2/FLU/RSV testing.  Fact Sheet for Patients: EntrepreneurPulse.com.au  Fact Sheet for Healthcare Providers: IncredibleEmployment.be  This test is not yet approved or cleared by the [place] FDA and has been authorized for detection and/or diagnosis of SARS-CoV-2 by FDA under an Emergency Use Authorization (EUA). This EUA will remain in effect (meaning this test can be used) for the  duration of the COVID-19 declaration under Section 564(b)(1) of the Act, 21 U.S.C. section 360bbb-3(b)(1), unless the authorization is terminated or revoked.  Performed at [facility], [place] [address] [place], [place] [address]   Hepatitis panel, acute     Status: None   Collection Time: 03/07/2020 10:00 AM  Result Value Ref Range   Hepatitis B Surface Ag NON REACTIVE NON REACTIVE   HCV Ab NON REACTIVE NON REACTIVE    Comment: (NOTE) Nonreactive HCV antibody screen is consistent with no HCV infections,  unless recent infection is suspected or other evidence exists to indicate HCV infection.     Hep A IgM NON REACTIVE NON REACTIVE   Hep B C IgM NON REACTIVE NON REACTIVE    Comment: Performed at [facility], [place] [address] [place], [place] [address]  Phosphorus     Status: Abnormal   Collection Time: 03/02/2020 10:00 AM  Result Value Ref Range   Phosphorus 2.2 (L) 2.5 - 4.6 mg/dL    Comment: Performed at [facility] [address] [place], [place] [address]   CT ABDOMEN PELVIS WO CONTRAST  Result Date: 02/23/2020 CLINICAL DATA:  Elevated LFTs with diffuse abdominal pain EXAM: CT ABDOMEN AND PELVIS WITHOUT CONTRAST TECHNIQUE: Multidetector CT  imaging of the abdomen and pelvis was performed following the standard protocol without IV contrast. COMPARISON:  06/05/2012, CT of the chest from 11/03/2019 FINDINGS: Lower chest: Lung bases are free of acute infiltrate or sizable effusion. There are however changes consistent with right hilar lymphadenopathy measuring 4.6 x 3.6 cm increased in size from the prior CT of the chest. Additionally subcarinal adenopathy is noted which is incompletely evaluated measuring 3.9 cm in short axis. This is also increased from the prior study. Hepatobiliary: Gallbladder has been surgically removed. Liver demonstrates fatty infiltration without focal definitive mass. Mild perihepatic ascites is seen. Pancreas: Unremarkable. No pancreatic  ductal dilatation or surrounding inflammatory changes. Spleen: Normal in size without focal abnormality. Adrenals/Urinary Tract: Adrenal glands are within normal limits. Kidneys demonstrate renal vascular calcifications although no obstructive changes are seen. The ureters are within normal limits. The bladder is partially distended. Stomach/Bowel: Scattered diverticular change of the colon is noted without obstructive change. The appendix is within normal limits. No obstructive changes are noted in the small bowel. The stomach is decompressed. Anterior abdominal wall hernia is identified containing loops of bowel although no incarceration is noted. These findings are stable from the prior exam. Vascular/Lymphatic: Aortic atherosclerosis. No enlarged abdominal or pelvic lymph nodes. Reproductive: Uterus and bilateral adnexa are unremarkable. IUD is noted in place. Other: Mild ascites is seen in the pelvis and surrounding the liver and spleen. This is new from the prior exam from August of this year. Mild changes of anasarca are noted as well. Musculoskeletal: Degenerative changes of lumbar spine are noted. Compression deformity of L3 is noted progressed in the interval from 2014. IMPRESSION: Significant increase in right hilar and subcarinal lymphadenopathy when compared with the recent CT of the chest from August of 2021. CT of the chest ideally with contrast material is recommended for further evaluation. Fatty liver. New mild ascites and changes of anasarca Anterior abdominal wall hernia similar to that seen in 2014 containing loops of bowel without evidence of incarceration. Diverticulosis without diverticulitis. Electronically Signed   By: Inez Catalina M.D.   On: 02/28/2020 11:38   DG Chest Port 1 View  Result Date: 03/05/2020 CLINICAL DATA:  Shortness of breath. EXAM: PORTABLE CHEST 1 VIEW COMPARISON:  January 23, 2020 FINDINGS: Lungs are clear. Heart size and pulmonary vascularity are normal. No  adenopathy. There is aortic atherosclerosis. No bone lesions. IMPRESSION: Lungs clear.  Stable cardiac silhouette. Aortic Atherosclerosis (ICD10-I70.0). Electronically Signed   By: Lowella Grip III M.D.   On: 03/19/2020 08:55    Review of Systems  Constitutional: Positive for activity change, fatigue and unexpected weight change. Negative for appetite change, chills and diaphoresis.  Eyes: Negative.   Respiratory: Positive for shortness of breath and wheezing. Negative for apnea, cough, choking and chest tightness.   Gastrointestinal: Positive for abdominal pain and diarrhea. Negative for abdominal distention, anal bleeding, blood in stool, nausea, rectal pain and vomiting.  Endocrine: Negative.   Genitourinary: Negative.   Musculoskeletal: Positive for arthralgias, back pain, joint swelling and myalgias.  Skin: Positive for color change and rash.  Neurological: Positive for dizziness, weakness and light-headedness.  Psychiatric/Behavioral: The patient is nervous/anxious.    Blood pressure (!) 124/42, pulse (!) 104, temperature 98.2 F (36.8 C), temperature source Oral, resp. rate 17, height 5\' 7"  (1.702 m), weight 117.9 kg, SpO2 91 %. Physical Exam Constitutional:      Appearance: She is ill-appearing.     Comments: Morbidly obese  HENT:     Head:  Normocephalic and atraumatic.     Mouth/Throat:     Mouth: Mucous membranes are dry.  Eyes:     Extraocular Movements: Extraocular movements intact.     Pupils: Pupils are equal, round, and reactive to light.  Neck:     Thyroid: No thyromegaly.  Cardiovascular:     Rate and Rhythm: Normal rate and regular rhythm.     Heart sounds: Normal heart sounds.  Pulmonary:     Effort: Tachypnea, accessory muscle usage and respiratory distress present.     Breath sounds: Examination of the right-upper field reveals wheezing. Examination of the left-upper field reveals wheezing. Examination of the right-lower field reveals decreased breath  sounds. Examination of the left-lower field reveals decreased breath sounds. Decreased breath sounds and wheezing present.  Abdominal:     General: Abdomen is protuberant. Bowel sounds are normal.     Tenderness: There is no abdominal tenderness.     Hernia: A hernia is present. Hernia is present in the ventral area.  Skin:    General: Skin is warm and moist.     Coloration: Skin is mottled.     Comments: Is mottling of the skin noted between the thighs  Neurological:     General: No focal deficit present.     Mental Status: She is alert.  Psychiatric:        Attention and Perception: Attention and perception normal.        Mood and Affect: Mood normal.        Speech: Speech normal.        Behavior: Behavior normal. Behavior is cooperative.        Thought Content: Thought content normal.   Assessment/Plan: 1) Abnormal LFTs with severe fatty liver on CT scan-as the patient had noncontrasted CT is difficult does not note if there is any evidence of choledocholithiasis. She would benefit from a right upper sound which may be limited due to her size. We will monitor LFTs closely and make further recommendations as needed.  Her hepatitis serologies have been negative. 2) Acute exacerbation of her COPD complicated by medical noncompliance. 3) Acute on chronic diastolic CHF decompensation. 4) Thrombocytopenia. 5) Morbid obesity/HTN. 6) History of paroxysmal atrial fibrillation currently in sinus rhythm on Eliquis. 7) Severe hypokalemia-currently being replenished. 8) Occasional diarrhea which might be secondary to her diabetes and multiple medications that she is on. I do not think the patient is operatory prime for a colonoscopy at this time till her acute medical problems stabilize.  I have discussed this with her in great detail. 9) Severe peripheral vascular disease. 10) History of tobacco abuse with over 440-year history of smoking. 11) Diverticulosis noted on CT scan done today. 12)  Fatty liver probably as a result of previous alcohol use and morbid obesity. 13) anterior abdominal wall hernia containing loops of bowel without any evidence of incarceration. 14) Chronic hilar lymphadenopathy. Juanita Craver 03/15/2020, 3:36 PM

## 2020-03-04 NOTE — ED Notes (Signed)
Visually checked on Pt she stated she was feeling ok. Pt IV infusing with no problems

## 2020-03-04 NOTE — ED Notes (Signed)
MD and PA notified of lab values

## 2020-03-04 NOTE — ED Notes (Signed)
Gave Pt "sponge sticks"  For dry mouth. Pt. A&O and doing well otherwise

## 2020-03-04 NOTE — ED Triage Notes (Signed)
Pt slid from couch and crawled to bathroom and had diarrhea. No LOC. Pitting edma bi laterally lower extremities. Has had some falls earlier in the week but never hit her head. Increasing weakness ov er last few weeks.

## 2020-03-05 ENCOUNTER — Other Ambulatory Visit (HOSPITAL_COMMUNITY): Payer: Medicare Other

## 2020-03-05 ENCOUNTER — Inpatient Hospital Stay (HOSPITAL_COMMUNITY): Payer: Medicare Other

## 2020-03-05 LAB — HEMOGLOBIN A1C
Hgb A1c MFr Bld: 6.5 % — ABNORMAL HIGH (ref 4.8–5.6)
Mean Plasma Glucose: 139.85 mg/dL

## 2020-03-05 LAB — COMPREHENSIVE METABOLIC PANEL
ALT: 215 U/L — ABNORMAL HIGH (ref 0–44)
AST: 322 U/L — ABNORMAL HIGH (ref 15–41)
Albumin: 2.4 g/dL — ABNORMAL LOW (ref 3.5–5.0)
Alkaline Phosphatase: 186 U/L — ABNORMAL HIGH (ref 38–126)
Anion gap: 13 (ref 5–15)
BUN: 43 mg/dL — ABNORMAL HIGH (ref 8–23)
CO2: 28 mmol/L (ref 22–32)
Calcium: 8.7 mg/dL — ABNORMAL LOW (ref 8.9–10.3)
Chloride: 100 mmol/L (ref 98–111)
Creatinine, Ser: 1.18 mg/dL — ABNORMAL HIGH (ref 0.44–1.00)
GFR, Estimated: 51 mL/min — ABNORMAL LOW (ref >60)
Glucose, Bld: 133 mg/dL — ABNORMAL HIGH (ref 70–99)
Potassium: 3.2 mmol/L — ABNORMAL LOW (ref 3.5–5.1)
Sodium: 141 mmol/L (ref 135–145)
Total Bilirubin: 4.3 mg/dL — ABNORMAL HIGH (ref 0.3–1.2)
Total Protein: 4.6 g/dL — ABNORMAL LOW (ref 6.5–8.1)

## 2020-03-05 LAB — PROTIME-INR
INR: 1.8 — ABNORMAL HIGH (ref 0.8–1.2)
Prothrombin Time: 20.5 seconds — ABNORMAL HIGH (ref 11.4–15.2)

## 2020-03-05 LAB — CBG MONITORING, ED
Glucose-Capillary: 154 mg/dL — ABNORMAL HIGH (ref 70–99)
Glucose-Capillary: 148 mg/dL — ABNORMAL HIGH (ref 70–99)
Glucose-Capillary: 122 mg/dL — ABNORMAL HIGH (ref 70–99)

## 2020-03-05 LAB — GLUCOSE, CAPILLARY: Glucose-Capillary: 108 mg/dL — ABNORMAL HIGH (ref 70–99)

## 2020-03-05 LAB — MAGNESIUM: Magnesium: 2.3 mg/dL (ref 1.7–2.4)

## 2020-03-05 LAB — PHOSPHORUS: Phosphorus: 3.7 mg/dL (ref 2.5–4.6)

## 2020-03-05 MED ORDER — DIPHENHYDRAMINE HCL 25 MG PO CAPS
50.0000 mg | ORAL_CAPSULE | Freq: Once | ORAL | Status: AC
  Administered 2020-03-05: 50 mg via ORAL
  Filled 2020-03-05: qty 2

## 2020-03-05 MED ORDER — IOHEXOL 300 MG/ML  SOLN: 75.0000 mL | Freq: Once | INTRAMUSCULAR | Status: DC | PRN

## 2020-03-05 MED ORDER — IOHEXOL 300 MG/ML  SOLN
75.0000 mL | Freq: Once | INTRAMUSCULAR | Status: AC | PRN
  Administered 2020-03-05: 75 mL via INTRAVENOUS

## 2020-03-05 MED ORDER — DIPHENHYDRAMINE HCL 50 MG/ML IJ SOLN: 50.0000 mg | Freq: Once | INTRAMUSCULAR | Status: AC

## 2020-03-05 MED ORDER — PREDNISONE 20 MG PO TABS
50.0000 mg | ORAL_TABLET | Freq: Four times a day (QID) | ORAL | Status: AC
  Administered 2020-03-05 (×3): 50 mg via ORAL
  Filled 2020-03-05: qty 3
  Filled 2020-03-05: qty 2
  Filled 2020-03-05: qty 3

## 2020-03-05 MED ORDER — POTASSIUM CHLORIDE CRYS ER 20 MEQ PO TBCR
40.0000 meq | EXTENDED_RELEASE_TABLET | Freq: Once | ORAL | Status: AC
  Administered 2020-03-05: 40 meq via ORAL
  Filled 2020-03-05: qty 2

## 2020-03-05 MED ORDER — INSULIN ASPART PROT & ASPART (70-30 MIX) 100 UNIT/ML ~~LOC~~ SUSP
40.0000 [IU] | Freq: Two times a day (BID) | SUBCUTANEOUS | Status: DC
  Administered 2020-03-05 – 2020-03-10 (×9): 40 [IU] via SUBCUTANEOUS
  Filled 2020-03-05: qty 10

## 2020-03-05 NOTE — Progress Notes (Signed)
PROGRESS NOTE  [name] [name]  DOB: 1955-09-16  PCP: [name] Pi, MD FHQ:[id number]  DOA: 02/21/2020  LOS: 1 day   Chief Complaint  Patient presents with  . Weakness    Brief narrative: IRIANA [name] is a 64 y.o. female with PMH significant for morbid obesity, DM2, HTN, HLD, HFpEF, paroxysmal A. fib on Eliquis, PAD s/p b/l fem-pop bypass, carotid artery stenosis, COPD, chronic hilar lymphadenopathy. Patient presented to the ED on 12/15 with progressive generalized weakness, shortness of breath and worsening leg swelling.  Patient was recently hospitalized in August 2021 for worsening of right lower extremity ischemia, underwent right-sided femoropopliteal bypass.  Since the surgery, patient has progressive bilateral leg swelling, right more than left.  She was started on Lasix 3 weeks ago by her PCP.  Leg swelling gradually improved but she has gotten progressively weaker, overall she feels very tired and has not been able to walk as much as before.  She also has several days of diarrhea.  In the ED, patient was afebrile, tachycardic to more than 100, blood pressure in 120s.  Labs showed potassium level low at 2, creatinine 1.14, AST/ALT/alk phos elevated to 175/124/221, platelets low at 74  CT scan of abdomen showed significant increase in the right hilar and subcarinal lymphadenopathy when compared with recent CT scan of chest from 2021.  Patient was admitted to hospitalist service. GI consultation was obtained.  Subjective: Patient was seen and examined this morning.  Pleasant elderly obese Caucasian female.  Lying down in bed.  On low-flow oxygen by nasal cannula. Continues to feel weak.  Has loose stool for last several days  Assessment/Plan: Severe hypokalemia Hypophosphatemia -Secondary to use of Lasix as well as ongoing diarrhea -Replacement given.  Levels improving.  Will give 1 more dose of potassium chloride 40 mEq. Recent Labs  Lab 03/02/20 1535 02/29/2020 0800  02/22/2020 1000 03/09/2020 1600 03/05/20 0413  K 2.4* 2.0*  --  2.6* 3.2*  MG  --  2.3  --   --  2.3  PHOS  --   --  2.2*  --  3.7   Diarrhea -For last few days.  Need to quantify.  Flexi-Seal inserted in the ED. -I would also obtain GI pathogen panel.  Elevated liver enzymes -Likely related to fatty liver disease and previous use of alcohol. -Statin on hold. -Acute hepatitis panel negative. -Trend LFTs daily. -Right upper quadrant ultrasound ordered. Recent Labs  Lab 03/02/20 1535 02/24/2020 0800 03/05/20 0413  AST 115* 175* 322*  ALT 106* 124* 215*  ALKPHOS 223* 221* 186*  BILITOT 3.1* 4.0* 4.3*  PROT 5.3* 5.1* 4.6*  ALBUMIN 2.9* 2.3* 2.4*   Progressive hilar lymphadenopathy -CT abdomen showed significant increase in the right hilar and subcarinal lymphadenopathy. -Obtain CT chest with contrast.  Because of her history of contrast allergy and need of Benadryl premedication in the past, I initiated the premedication order set prior to CT scan. -Uses 2 L oxygen at home at night.  Continue to monitor oxygen requirement.  Diabetes mellitus type 2 -On 60 units twice daily of insulin 70/30 at home.  Unclear compliance. -Resume the same at 40 units twice daily.  Continue to monitor with sliding scale insulin and Accu-Cheks. PAD s/p b/l fem-pop bypass, carotid artery stenosis -Home meds include Eliquis 5 mg twice daily, aspirin 81 mg daily and statin. -Continue Eliquis and aspirin.  Keep a statin on hold because of liver enzyme elevation. -Also continue Neurontin 300 mg 3 times daily.  COPD -Resume bronchodilators.  Chronic diastolic CHF decompensation Essential hypertension -It seems that patient was taking HCTZ 25 mg daily and lisinopril 10 mg twice daily at home.  3 weeks prior, Lasix 40 mg daily was also added because of pedal edema. -At this time, patient seems to have improving pedal edema.  She is not on acute CHF exacerbation. -I would keep all 3 medications on hold at  this time because of profound hypokalemia.  Continue to monitor blood pressure.  Morbid obesity  -Body mass index is 40.72 kg/m. Patient has been advised to make an attempt to improve diet and exercise patterns to aid in weight loss.  History of psoriasis  -Uses clobetasol cream.  Not on systemic therapy.    Thrombocytopenia -No obvious bleeding, H&H stable, repeat CBC tomorrow Recent Labs  Lab 03/20/2020 0800  PLT 74*   Paroxysmal A. fib  -Currently in sinus rhythm with heart rate between 100 and 110.  -Continue anticoagulation with Eliquis.  Mobility: PT eval. Code Status:   Code Status: Full Code  Nutritional status: Body mass index is 40.72 kg/m.     Diet Order            Diet heart healthy/carb modified Room service appropriate? Yes; Fluid consistency: Thin; Fluid restriction: 1800 mL Fluid  Diet effective now                 DVT prophylaxis: apixaban (ELIQUIS) tablet 5 mg   Antimicrobials:  None Fluid: None Consultants: GI Family Communication:  None at bedside  Status is: Inpatient  Remains inpatient appropriate because:Persistent severe electrolyte disturbances and Ongoing diagnostic testing needed not appropriate for outpatient work up   Dispo: The patient is from: Home              Anticipated d/c is to: Home most likely              Anticipated d/c date is: > 3 days              Patient currently is not medically stable to d/c.       Infusions:  . sodium chloride    . albumin human Stopped (03/05/20 0945)    Scheduled Meds: . apixaban  5 mg Oral BID  . vitamin C  1,000 mg Oral Daily  . diphenhydrAMINE  50 mg Oral Once   Or  . diphenhydrAMINE  50 mg Intravenous Once  . ethacrynic acid  50 mg Oral BID  . fluticasone furoate-vilanterol  1 puff Inhalation Daily  . gabapentin  300 mg Oral Daily  . insulin aspart  0-15 Units Subcutaneous TID WC  . insulin aspart protamine- aspart  40 Units Subcutaneous BID WC  . ipratropium-albuterol  3 mL  Nebulization Q6H  . lisinopril  10 mg Oral BID  . multivitamin with minerals  1 tablet Oral Daily  . nicotine  7 mg Transdermal Daily  . potassium & sodium phosphates  1 packet Oral TID WC & HS  . predniSONE  50 mg Oral Q6H  . sertraline  100 mg Oral Daily  . sodium chloride flush  3 mL Intravenous Q12H    Antimicrobials: Anti-infectives (From admission, onward)   None      PRN meds: sodium chloride, acetaminophen, albuterol, ondansetron (ZOFRAN) IV, sodium chloride flush   Objective: Vitals:   03/05/20 0800 03/05/20 0900  BP: (!) 150/64 139/60  Pulse: (!) 105 (!) 108  Resp: 12 10  Temp:    SpO2: 97% 95%  Intake/Output Summary (Last 24 hours) at 03/05/2020 1008 Last data filed at 03/05/2020 0711 Gross per 24 hour  Intake 429.59 ml  Output --  Net 429.59 ml   Filed Weights   03/14/2020 0747  Weight: 117.9 kg   Weight change:  Body mass index is 40.72 kg/m.   Physical Exam: General exam: Pleasant, elderly Caucasian female.  Not in physical distress Skin: No rashes, lesions or ulcers. HEENT: Atraumatic, normocephalic, no obvious bleeding Lungs: Diminished air entry in both bases. CVS: Regular rate and rhythm, no murmur GI/Abd soft, distended obesity, nontender, bowel sound present CNS: Alert, awake, oriented x3 Psychiatry: Mood appropriate Extremities: Pedal edema 1+ bilaterally with skin wrinkling, left calf red which patient believes is secondary to psoriasis.  Data Review: I have personally reviewed the laboratory data and studies available.  Recent Labs  Lab 02/26/2020 0800  WBC 12.2*  NEUTROABS 11.1*  HGB 12.8  HCT 42.2  MCV 78.4*  PLT 74*   Recent Labs  Lab 03/02/20 1535 03/07/2020 0800 02/29/2020 1000 03/02/2020 1600 03/05/20 0413  NA 143 143  --  142 141  K 2.4* 2.0*  --  2.6* 3.2*  CL 95* 97*  --  99 100  CO2 34* 32  --  30 28  GLUCOSE 133* 99  --  83 133*  BUN 41* 33*  --  35* 43*  CREATININE 1.01 1.14*  --  1.01* 1.18*  CALCIUM 8.6 9.0   --  8.6* 8.7*  MG  --  2.3  --   --  2.3  PHOS  --   --  2.2*  --  3.7    F/u labs ordered.  Signed, Terrilee Croak, MD [facility] 03/05/2020

## 2020-03-05 NOTE — ED Notes (Signed)
Pt repositioned in bed for dinner tray.

## 2020-03-05 NOTE — ED Notes (Signed)
Lunch Tray Ordered @ 1032. 

## 2020-03-05 NOTE — ED Notes (Signed)
Pt with large amt black, liquid stool. Brief changed.

## 2020-03-05 NOTE — ED Notes (Signed)
Breakfast ordered 

## 2020-03-05 NOTE — ED Notes (Signed)
Pt incontinent of bowel, peri care provided

## 2020-03-05 NOTE — ED Notes (Signed)
Pharmacy to call when Albumin is ready.

## 2020-03-05 NOTE — Progress Notes (Signed)
Subjective: No complaints.  Objective: Vital signs in last 24 hours: Temp:  [97.6 F (36.4 C)-98.4 F (36.9 C)] 97.6 F (36.4 C) (12/16 1500) Pulse Rate:  [96-111] 102 (12/16 1500) Resp:  [10-21] 12 (12/16 1500) BP: (93-159)/(47-106) 159/78 (12/16 1500) SpO2:  [92 %-99 %] 94 % (12/16 1500)    Intake/Output from previous day: 12/15 0701 - 12/16 0700 In: 346.2 [IV Piggyback:346.2] Out: -  Intake/Output this shift: Total I/O In: 83.4 [IV Piggyback:83.4] Out: -   General appearance: alert and mild distress Resp: clear to auscultation bilaterally Cardio: regular rate and rhythm GI: obese, soft, nontender, nondistended Extremities: edema 2+  Lab Results: Recent Labs    02/19/2020 0800  WBC 12.2*  HGB 12.8  HCT 42.2  PLT 74*   BMET Recent Labs    02/27/2020 0800 03/03/2020 1600 03/05/20 0413  NA 143 142 141  K 2.0* 2.6* 3.2*  CL 97* 99 100  CO2 32 30 28  GLUCOSE 99 83 133*  BUN 33* 35* 43*  CREATININE 1.14* 1.01* 1.18*  CALCIUM 9.0 8.6* 8.7*   LFT Recent Labs    03/05/20 0413  PROT 4.6*  ALBUMIN 2.4*  AST 322*  ALT 215*  ALKPHOS 186*  BILITOT 4.3*   PT/INR Recent Labs    03/05/20 0413  LABPROT 20.5*  INR 1.8*   Hepatitis Panel Recent Labs    02/22/2020 1000  HEPBSAG NON REACTIVE  HCVAB NON REACTIVE  HEPAIGM NON REACTIVE  HEPBIGM NON REACTIVE   C-Diff No results for input(s): CDIFFTOX in the last 72 hours. Fecal Lactopherrin No results for input(s): FECLLACTOFRN in the last 72 hours.  Studies/Results: CT ABDOMEN PELVIS WO CONTRAST  Result Date: 02/26/2020 CLINICAL DATA:  Elevated LFTs with diffuse abdominal pain EXAM: CT ABDOMEN AND PELVIS WITHOUT CONTRAST TECHNIQUE: Multidetector CT imaging of the abdomen and pelvis was performed following the standard protocol without IV contrast. COMPARISON:  06/05/2012, CT of the chest from 11/03/2019 FINDINGS: Lower chest: Lung bases are free of acute infiltrate or sizable effusion. There are however  changes consistent with right hilar lymphadenopathy measuring 4.6 x 3.6 cm increased in size from the prior CT of the chest. Additionally subcarinal adenopathy is noted which is incompletely evaluated measuring 3.9 cm in short axis. This is also increased from the prior study. Hepatobiliary: Gallbladder has been surgically removed. Liver demonstrates fatty infiltration without focal definitive mass. Mild perihepatic ascites is seen. Pancreas: Unremarkable. No pancreatic ductal dilatation or surrounding inflammatory changes. Spleen: Normal in size without focal abnormality. Adrenals/Urinary Tract: Adrenal glands are within normal limits. Kidneys demonstrate renal vascular calcifications although no obstructive changes are seen. The ureters are within normal limits. The bladder is partially distended. Stomach/Bowel: Scattered diverticular change of the colon is noted without obstructive change. The appendix is within normal limits. No obstructive changes are noted in the small bowel. The stomach is decompressed. Anterior abdominal wall hernia is identified containing loops of bowel although no incarceration is noted. These findings are stable from the prior exam. Vascular/Lymphatic: Aortic atherosclerosis. No enlarged abdominal or pelvic lymph nodes. Reproductive: Uterus and bilateral adnexa are unremarkable. IUD is noted in place. Other: Mild ascites is seen in the pelvis and surrounding the liver and spleen. This is new from the prior exam from August of this year. Mild changes of anasarca are noted as well. Musculoskeletal: Degenerative changes of lumbar spine are noted. Compression deformity of L3 is noted progressed in the interval from 2014. IMPRESSION: Significant increase in right hilar and subcarinal  lymphadenopathy when compared with the recent CT of the chest from August of 2021. CT of the chest ideally with contrast material is recommended for further evaluation. Fatty liver. New mild ascites and changes  of anasarca Anterior abdominal wall hernia similar to that seen in 2014 containing loops of bowel without evidence of incarceration. Diverticulosis without diverticulitis. Electronically Signed   By: Inez Catalina M.D.   On: 03/20/2020 11:38   DG Chest Port 1 View  Result Date: 02/19/2020 CLINICAL DATA:  Shortness of breath. EXAM: PORTABLE CHEST 1 VIEW COMPARISON:  January 23, 2020 FINDINGS: Lungs are clear. Heart size and pulmonary vascularity are normal. No adenopathy. There is aortic atherosclerosis. No bone lesions. IMPRESSION: Lungs clear.  Stable cardiac silhouette. Aortic Atherosclerosis (ICD10-I70.0). Electronically Signed   By: Lowella Grip III M.D.   On: 03/03/2020 08:55   US Abdomen Limited RUQ (LIVER/GB)  Result Date: 03/05/2020 CLINICAL DATA:  Elevated LFTs, history COPD, type II diabetes mellitus, hypertension EXAM: ULTRASOUND ABDOMEN LIMITED RIGHT UPPER QUADRANT COMPARISON:  CT abdomen and pelvis 03/15/2020 FINDINGS: Gallbladder: Surgically absent Common bile duct: Diameter: 2 mm, normal Liver: Heterogeneous hepatic echogenicity with nodular margins consistent with cirrhosis. No discrete hepatic mass. Portal vein is patent on color Doppler imaging with normal direction of blood flow towards the liver. Other: Small amount of ascites. Image quality degraded secondary to body habitus. IMPRESSION: Post cholecystectomy. Cirrhotic appearing liver without mass. Small amount of ascites. Electronically Signed   By: Lavonia Dana M.D.   On: 03/05/2020 11:01    Medications:  Scheduled: . apixaban  5 mg Oral BID  . vitamin C  1,000 mg Oral Daily  . diphenhydrAMINE  50 mg Oral Once  . ethacrynic acid  50 mg Oral BID  . fluticasone furoate-vilanterol  1 puff Inhalation Daily  . gabapentin  300 mg Oral Daily  . insulin aspart  0-15 Units Subcutaneous TID WC  . insulin aspart protamine- aspart  40 Units Subcutaneous BID WC  . ipratropium-albuterol  3 mL Nebulization Q6H  . lisinopril  10 mg  Oral BID  . multivitamin with minerals  1 tablet Oral Daily  . nicotine  7 mg Transdermal Daily  . predniSONE  50 mg Oral Q6H  . sertraline  100 mg Oral Daily  . sodium chloride flush  3 mL Intravenous Q12H   Continuous: . sodium chloride    . albumin human 12.5 g (03/05/20 1609)    Assessment/Plan: 1) Decompensated cirrhosis. 2) Elevated liver enzymes. 3) Diastolic CHF. 4) ? Melena. 5) Hypokalemia. 6) Thrombocytopenia. 7) PVD.   The patient's ultrasound shows a nodular liver edge and perihepatic ascites.  She has thrombocytopenia and it appears that she has decompensated cirrhosis.  Her liver enzymes increased as well as a mild deterioration of her INR.  There is a reported history of diastolic CHF, but no recent cardiac work up.  The suspicion for her hepatic decompensation is from her CHF and other medical issues.  She is in extremely poor health, but medical management should be able to stabilize her condition.  The patient remains hypokalemic and it is as a result of her use of lasix, which is being held.  She is on ethacrynic acid, which is a rare medication to use.  However, it appears to be acceptable.  She will benefit from an inpatient cardiology consultation.  Plan: 1) 2 gram sodium diet. 2) Okay with ethacrynic acid.  Pending her response spironolactone may be added onto her care. 3) Cardiology consultation.  4) Continue to replete potassium.  LOS: 1 day   Shyrl Obi D 03/05/2020, 4:45 PM

## 2020-03-05 NOTE — ED Notes (Signed)
Patient inncont x4 with dark green loose stools clean pt. Up change linen  Clean chuxs and bottom sheet.

## 2020-03-06 DIAGNOSIS — R918 Other nonspecific abnormal finding of lung field: Secondary | ICD-10-CM

## 2020-03-06 LAB — GLUCOSE, CAPILLARY
Glucose-Capillary: 89 mg/dL (ref 70–99)
Glucose-Capillary: 84 mg/dL (ref 70–99)
Glucose-Capillary: 141 mg/dL — ABNORMAL HIGH (ref 70–99)
Glucose-Capillary: 122 mg/dL — ABNORMAL HIGH (ref 70–99)

## 2020-03-06 LAB — PHOSPHORUS: Phosphorus: 3 mg/dL (ref 2.5–4.6)

## 2020-03-06 LAB — HEPATIC FUNCTION PANEL
ALT: 196 U/L — ABNORMAL HIGH (ref 0–44)
AST: 269 U/L — ABNORMAL HIGH (ref 15–41)
Albumin: 2.5 g/dL — ABNORMAL LOW (ref 3.5–5.0)
Alkaline Phosphatase: 183 U/L — ABNORMAL HIGH (ref 38–126)
Bilirubin, Direct: 1.9 mg/dL — ABNORMAL HIGH (ref 0.0–0.2)
Indirect Bilirubin: 1.6 mg/dL — ABNORMAL HIGH (ref 0.3–0.9)
Total Bilirubin: 3.5 mg/dL — ABNORMAL HIGH (ref 0.3–1.2)
Total Protein: 4.7 g/dL — ABNORMAL LOW (ref 6.5–8.1)

## 2020-03-06 LAB — CBC WITH DIFFERENTIAL/PLATELET
Abs Immature Granulocytes: 0.06 10*3/uL (ref 0.00–0.07)
Basophils Absolute: 0 10*3/uL (ref 0.0–0.1)
Basophils Relative: 0 %
Eosinophils Absolute: 0 10*3/uL (ref 0.0–0.5)
Eosinophils Relative: 0 %
HCT: 34.1 % — ABNORMAL LOW (ref 36.0–46.0)
Hemoglobin: 10.5 g/dL — ABNORMAL LOW (ref 12.0–15.0)
Immature Granulocytes: 1 %
Lymphocytes Relative: 7 %
Lymphs Abs: 0.5 10*3/uL — ABNORMAL LOW (ref 0.7–4.0)
MCH: 24 pg — ABNORMAL LOW (ref 26.0–34.0)
MCHC: 30.8 g/dL (ref 30.0–36.0)
MCV: 77.9 fL — ABNORMAL LOW (ref 80.0–100.0)
Monocytes Absolute: 0.3 10*3/uL (ref 0.1–1.0)
Monocytes Relative: 4 %
Neutro Abs: 5.9 10*3/uL (ref 1.7–7.7)
Neutrophils Relative %: 88 %
Platelets: 44 10*3/uL — ABNORMAL LOW (ref 150–400)
RBC: 4.38 MIL/uL (ref 3.87–5.11)
RDW: 23.5 % — ABNORMAL HIGH (ref 11.5–15.5)
WBC: 6.8 10*3/uL (ref 4.0–10.5)
nRBC: 0 % (ref 0.0–0.2)

## 2020-03-06 LAB — BASIC METABOLIC PANEL
Anion gap: 12 (ref 5–15)
BUN: 44 mg/dL — ABNORMAL HIGH (ref 8–23)
CO2: 31 mmol/L (ref 22–32)
Calcium: 8.5 mg/dL — ABNORMAL LOW (ref 8.9–10.3)
Chloride: 99 mmol/L (ref 98–111)
Creatinine, Ser: 0.92 mg/dL (ref 0.44–1.00)
GFR, Estimated: >60 mL/min (ref >60)
Glucose, Bld: 73 mg/dL (ref 70–99)
Potassium: 2.5 mmol/L — CL (ref 3.5–5.1)
Sodium: 142 mmol/L (ref 135–145)

## 2020-03-06 LAB — MAGNESIUM: Magnesium: 2.2 mg/dL (ref 1.7–2.4)

## 2020-03-06 MED ORDER — POTASSIUM CHLORIDE CRYS ER 20 MEQ PO TBCR
40.0000 meq | EXTENDED_RELEASE_TABLET | ORAL | Status: AC
  Administered 2020-03-06 (×3): 40 meq via ORAL
  Filled 2020-03-06 (×3): qty 2

## 2020-03-06 NOTE — Consult Note (Addendum)
Chief Complaint: Patient was seen in consultation today for right supraclavicular lymph node biopsy.  Referring Physician(s): Dahal, Marlowe Aschoff  Supervising Physician: Aletta Edouard  Patient Status: [facility] - In-pt  History of Present Illness: [name] [name] is a 64 y.o. female with a past medical history significant for depression, arthritis, HTN, HLD, PVD s/p b/l fem-pop bypass (August 2021), paroxysmal a.fib on Eliquis, carotid stenosis, thrombocytopenia, DM, chronic hilar lymphadenopathy and COPD who presented to the ED on 12/15 with complaints of weakness, leg swelling and dyspnea. She was found to be tachycardic with K+ 2.0, plt 74 and elevated LFTs. CT chest w/contrast showed mediastinal and right hilar adenopathy, progressed from prior exam, suspicious for malignancy and causing circumferential narrowing of the bronchus intermedius, right lower and middle lobe bronchi; additionally noted were enlarged lymph nodes in the right supraclavicular and upper abdomen celiac stations. She was admitted for further evaluation and management and IR has been asked to perform a supraclavicular lymph node biopsy to further evaluate these findings.   [name] [name] seen in her room today, she is laying back in her recliner watching TV. She tells me she feels a lot better today than when she first arrived. She is aware of the biopsy and would like to proceed, she is hopeful that she will be able to go home after the biopsy.  Past Medical History:  Diagnosis Date  . Abdominal abscess   . Allergic rhinitis   . Arthritis    Hip and neck  . Asthma   . Bacteremia 03/2017  . Carotid stenosis, left 04/2009   50-69%  . Colon polyps 12/05  . COPD (chronic obstructive pulmonary disease) ([place])   . Depression   . Diabetes mellitus    followed by Dr. Chalmers Cater- type II  . Dyspnea    with exertion   . Folate deficiency   . Hyperlipidemia   . Hypertension   . Lumbar disc disease    h/o hnp with repair  .  Obesity, morbid (more than 100 lbs over ideal weight or BMI > 40) ([facility])   . Peripheral vascular disease ([place])   . Psoriasis   . Thrombocytopenia ([place])    work up with Dr. Griffith Citron neg in 2000  . Tobacco abuse     Past Surgical History:  Procedure Laterality Date  . ABDOMINAL AORTOGRAM W/LOWER EXTREMITY N/A 02/28/2017   Procedure: ABDOMINAL AORTOGRAM W/LOWER EXTREMITY;  Surgeon: Serafina Mitchell, MD;  Location: [place] [facility];  Service: Cardiovascular;  Laterality: N/A;  Bilateral  . ABDOMINAL AORTOGRAM W/LOWER EXTREMITY N/A 10/29/2019   Procedure: ABDOMINAL AORTOGRAM W/LOWER EXTREMITY;  Surgeon: Serafina Mitchell, MD;  Location: [place] [facility];  Service: Cardiovascular;  Laterality: N/A;  . ANGIOPLASTY Right 10/30/2019   Procedure: RIGHT LEG BALLOON  ANGIOPLASTY SFA;  Surgeon: Serafina Mitchell, MD;  Location: [place];  Service: Vascular;  Laterality: Right;  . BACK SURGERY  1989   LS  for HNP  . CHOLECYSTECTOMY  1982  . ENDARTERECTOMY Left 06/30/2017   Procedure: ENDARTERECTOMY CAROTID LEFT;  Surgeon: Serafina Mitchell, MD;  Location: [place];  Service: Vascular;  Laterality: Left;  . FEMORAL-POPLITEAL BYPASS GRAFT Right 04/07/2017   Procedure: RIGHT FEMORAL-BELOW POPLITEAL ARTERY BYPASS WITH VEIN, RIGHT COMMON AND PRFUNDA FEMORAL ARTERY ENDARTERECTOMY WITH VEIN PATCH ANGIOPLASTY;  Surgeon: Serafina Mitchell, MD;  Location: [place];  Service: Vascular;  Laterality: Right;  . FEMORAL-POPLITEAL BYPASS GRAFT Left 10/30/2019   Procedure: BYPASS GRAFT FEMORAL-POPLITEAL ARTERY;  Surgeon:  Serafina Mitchell, MD;  Location: [facility] OR;  Service: Vascular;  Laterality: Left;  . HERNIA REPAIR  2001   incisional  . INCISE AND DRAIN ABCESS  01/11/11   abd abscess    Allergies: Iohexol, Penicillins, Levaquin [levofloxacin], Morphine and related, and Sulfa antibiotics  Medications: Prior to Admission medications   Medication Sig Start Date End Date Taking? Authorizing Provider  albuterol (PROVENTIL  HFA;VENTOLIN HFA) 108 (90 BASE) MCG/ACT inhaler Inhale 2 puffs into the lungs every 6 (six) hours as needed for wheezing.   Yes [provider]  apixaban (ELIQUIS) 5 MG TABS tablet Take 1 tablet (5 mg total) by mouth 2 (two) times daily. 11/16/19  Yes Baglia, Corrina, PA-C  Ascorbic Acid (VITAMIN C) 1000 MG tablet Take 1,000 mg by mouth daily.   Yes [provider]  aspirin 81 MG EC tablet Take 81 mg by mouth daily.   Yes [provider]  atorvastatin (LIPITOR) 20 MG tablet Take 1 tablet (20 mg total) by mouth at bedtime. Patient taking differently: Take 20 mg by mouth daily. 04/10/17  Yes Rai, Ripudeep K, MD  Cholecalciferol (VITAMIN D3) 1000 UNITS CAPS Take 1,000 Units by mouth 2 (two) times daily.   Yes [provider]  clobetasol ointment (TEMOVATE) 1.06 % Apply 1 application topically 2 (two) times daily as needed (psoriasis).  05/15/17  Yes [provider]  furosemide (LASIX) 40 MG tablet Take 40 mg by mouth daily.   Yes [provider]  gabapentin (NEURONTIN) 300 MG capsule Take 1 capsule (300 mg total) by mouth 3 (three) times daily. Patient taking differently: Take 300 mg by mouth daily. 04/10/17  Yes Rai, Ripudeep K, MD  hydrochlorothiazide (HYDRODIURIL) 25 MG tablet TAKE 1 TABLET DAILY Patient taking differently: Take 25 mg by mouth daily.   Yes Schoenhoff, Altamese Cabal, MD  insulin NPH-regular Human (NOVOLIN 70/30) (70-30) 100 UNIT/ML injection Inject 60 Units into the skin 2 (two) times daily.    Yes [provider]  lisinopril (ZESTRIL) 10 MG tablet Take 10 mg by mouth 2 (two) times daily. 10/25/19  Yes [provider]  Multiple Vitamin (MULTIVITAMIN WITH MINERALS) TABS tablet Take 1 tablet by mouth daily.   Yes [provider]  Multiple Vitamins-Minerals (ZINC PO) Take 1 tablet by mouth daily.   Yes [provider]  nicotine (NICODERM CQ - DOSED IN MG/24 HR) 7 mg/24hr patch Place 7 mg onto the skin daily.    Yes [provider]  Potassium Chloride ER 20 MEQ TBCR Take 2 tablets now and 2 tablets this evening.  Take 2 tablets daily starting tomorrow 02/25/2020. Patient taking differently: Take 20 mEq by mouth daily. 02/24/20  Yes Freddi Starr, MD  sertraline (ZOLOFT) 100 MG tablet Take 100 mg by mouth once daily Patient taking differently: Take 100 mg by mouth daily. 04/10/17  Yes Rai, Vernelle Emerald, MD     Family History  Problem Relation Age of Onset  . Asthma Mother   . Hypertension Mother   . Stroke Father   . Alcohol abuse Father   . Cancer Sister        brain stem tumor  . Suicidality Sister   . Cancer Sister        breast  . Suicidality Brother   . Dementia Sister     Social History   Socioeconomic History  . Marital status: Divorced    Spouse name: Not on file  . Number of children: 2  .  Years of education: HS grad  . Highest education level: Not on file  Occupational History  . Occupation: QUALITY MANAGER    Employer: [employer]  Tobacco Use  . Smoking status: Former Smoker    Packs/day: 2.00    Years: 47.00    Pack years: 94.00    Types: Cigarettes    Quit date: 10/30/2019    Years since quitting: 0.3  . Smokeless tobacco: Never Used  Vaping Use  . Vaping Use: Never used  Substance and Sexual Activity  . Alcohol use: No  . Drug use: No  . Sexual activity: Not Currently    Partners: Male    Birth control/protection: I.U.D.  Other Topics Concern  . Not on file  Social History Narrative  . Not on file   Social Determinants of Health   Financial Resource Strain: Not on file  Food Insecurity: Not on file  Transportation Needs: Not on file  Physical Activity: Not on file  Stress: Not on file  Social Connections: Not on file     Review of Systems: A 12 point ROS discussed and pertinent positives are indicated in the HPI above.  All other systems are negative.  Review of Systems  Constitutional: Negative for chills and fever.   Respiratory: Negative for cough and shortness of breath.   Cardiovascular: Negative for chest pain.  Gastrointestinal: Negative for abdominal pain, nausea and vomiting.  Musculoskeletal: Negative for back pain.  Neurological: Negative for dizziness and headaches.    Vital Signs: BP 136/63 (BP Location: Left Arm)   Pulse 96   Temp 98.2 F (36.8 C) (Oral)   Resp 20   Ht 5\' 7"  (1.702 m)   Wt 275 lb 5.7 oz (124.9 kg)   SpO2 96%   BMI 43.13 kg/m   Physical Exam Vitals and nursing note reviewed.  Constitutional:      General: She is not in acute distress. HENT:     Head: Normocephalic.     Mouth/Throat:     Mouth: Mucous membranes are moist.     Pharynx: Oropharynx is clear. No oropharyngeal exudate or posterior oropharyngeal erythema.  Cardiovascular:     Rate and Rhythm: Normal rate and regular rhythm.  Pulmonary:     Effort: Pulmonary effort is normal.     Comments: Decreased breath sounds bilaterally, supplemental O2 via [place] in place Abdominal:     General: There is no distension.     Palpations: Abdomen is soft.     Tenderness: There is no abdominal tenderness.  Skin:    General: Skin is warm and dry.  Neurological:     Mental Status: She is alert and oriented to person, place, and time.  Psychiatric:        Mood and Affect: Mood normal.        Behavior: Behavior normal.        Thought Content: Thought content normal.        Judgment: Judgment normal.      MD Evaluation Airway: WNL Heart: WNL Abdomen: WNL Chest/ Lungs: WNL ASA  Classification: 3 Mallampati/Airway Score: Two   Imaging: CT ABDOMEN PELVIS WO CONTRAST  Result Date: 02/21/2020 CLINICAL DATA:  Elevated LFTs with diffuse abdominal pain EXAM: CT ABDOMEN AND PELVIS WITHOUT CONTRAST TECHNIQUE: Multidetector CT imaging of the abdomen and pelvis was performed following the standard protocol without IV contrast. COMPARISON:  06/05/2012, CT of the chest from 11/03/2019 FINDINGS: Lower chest: Lung  bases are free of acute infiltrate or sizable  effusion. There are however changes consistent with right hilar lymphadenopathy measuring 4.6 x 3.6 cm increased in size from the prior CT of the chest. Additionally subcarinal adenopathy is noted which is incompletely evaluated measuring 3.9 cm in short axis. This is also increased from the prior study. Hepatobiliary: Gallbladder has been surgically removed. Liver demonstrates fatty infiltration without focal definitive mass. Mild perihepatic ascites is seen. Pancreas: Unremarkable. No pancreatic ductal dilatation or surrounding inflammatory changes. Spleen: Normal in size without focal abnormality. Adrenals/Urinary Tract: Adrenal glands are within normal limits. Kidneys demonstrate renal vascular calcifications although no obstructive changes are seen. The ureters are within normal limits. The bladder is partially distended. Stomach/Bowel: Scattered diverticular change of the colon is noted without obstructive change. The appendix is within normal limits. No obstructive changes are noted in the small bowel. The stomach is decompressed. Anterior abdominal wall hernia is identified containing loops of bowel although no incarceration is noted. These findings are stable from the prior exam. Vascular/Lymphatic: Aortic atherosclerosis. No enlarged abdominal or pelvic lymph nodes. Reproductive: Uterus and bilateral adnexa are unremarkable. IUD is noted in place. Other: Mild ascites is seen in the pelvis and surrounding the liver and spleen. This is new from the prior exam from August of this year. Mild changes of anasarca are noted as well. Musculoskeletal: Degenerative changes of lumbar spine are noted. Compression deformity of L3 is noted progressed in the interval from 2014. IMPRESSION: Significant increase in right hilar and subcarinal lymphadenopathy when compared with the recent CT of the chest from August of 2021. CT of the chest ideally with contrast material is  recommended for further evaluation. Fatty liver. New mild ascites and changes of anasarca Anterior abdominal wall hernia similar to that seen in 2014 containing loops of bowel without evidence of incarceration. Diverticulosis without diverticulitis. Electronically Signed   By: Inez Catalina M.D.   On: 03/04/2020 11:38   CT CHEST W CONTRAST  Result Date: 03/05/2020 CLINICAL DATA:  Shortness of breath and leg swelling. Worsening lymphadenopathy. EXAM: CT CHEST WITH CONTRAST TECHNIQUE: Multidetector CT imaging of the chest was performed during intravenous contrast administration. CONTRAST:  7mL OMNIPAQUE IOHEXOL 300 MG/ML  SOLN COMPARISON:  Chest radiograph yesterday. Included portions from abdominal CT yesterday. Chest CT 11/03/2019 FINDINGS: Cardiovascular: Aortic atherosclerosis. Conventional branching pattern from the aortic arch. No filling defects in the central pulmonary arteries to the segmental level to suggest pulmonary embolus. Right hilar adenopathy causes narrowing of the right pulmonary arteries. Coronary artery calcifications versus stents. Heart is normal in size. Mediastinum/Nodes: Progressive right hilar and subcarinal adenopathy that appears bulky. Largest discrete right hilar node measures 2.1 cm short axis dimension, series 3, image 63. Subcarinal nodal conglomerate spans 4.3 x 4.1 cm, series 3, image 72. There are enlarged lower paratracheal nodes, measuring up to 2 cm, series 3, image 35. There is no left hilar adenopathy. Enlarged right supraclavicular nodes, 18 mm short axis, series 3, image 14. There is no axillary adenopathy. No internal mammary adenopathy. The esophagus is decompressed, midportion cannot be delineated from adjacent subcarinal lymph nodes. No suspicious thyroid nodule. Lungs/Pleura: Mild geographic ground-glass opacities which are most prominent in the right and left upper lobe, significant improvement from CT 4 months ago. Right hilar adenopathy causes circumferential  narrowing of the right bronchus intermedius, middle lobe and lower lobe bronchus. Associated right lower lobe bronchial thickening. Linear atelectasis in both lower lobes. There is no dominant pulmonary mass. No pulmonary nodule other than a slightly nodular appearance of some of  the geographic ground-glass opacities. No pleural fluid. Minimal emphysema. Upper Abdomen: Upper abdominal ascites is seen on abdominal CT yesterday there is Peri celiac adenopathy, for example 2.6 mm node series 3, image 142 upper abdominal adenopathy is difficult to delineate given phase of contrast. Musculoskeletal: No acute osseous abnormality. No blastic or destructive lytic lesion. Multiple remote right rib fractures. IMPRESSION: 1. Mediastinal and right hilar adenopathy, progressed from prior exam, suspicious for malignancy. Right hilar adenopathy causes circumferential narrowing of the bronchus intermedius, right lower and middle lobe bronchi. There is no discrete pulmonary nodule or mass to suggest primary pulmonary malignancy. There also enlarged lymph nodes in the right supraclavicular and upper abdomen in the celiac stations. Consider further workup with either PET-CT or tissue sampling. 2. Mild geographic ground-glass opacities in both upper lobes, significant improvement from CT 4 months ago. This may be related to pulmonary edema or atypical infection. The appearance is not typical for neoplasm. 3. Minimal emphysema. Aortic Atherosclerosis (ICD10-I70.0). Emphysema (ICD10-J43.9). Electronically Signed   By: Keith Rake M.D.   On: 03/05/2020 23:28   DG Chest Port 1 View  Result Date: 03/06/2020 CLINICAL DATA:  Shortness of breath. EXAM: PORTABLE CHEST 1 VIEW COMPARISON:  January 23, 2020 FINDINGS: Lungs are clear. Heart size and pulmonary vascularity are normal. No adenopathy. There is aortic atherosclerosis. No bone lesions. IMPRESSION: Lungs clear.  Stable cardiac silhouette. Aortic Atherosclerosis (ICD10-I70.0).  Electronically Signed   By: Lowella Grip III M.D.   On: 03/12/2020 08:55   US Abdomen Limited RUQ (LIVER/GB)  Result Date: 03/05/2020 CLINICAL DATA:  Elevated LFTs, history COPD, type II diabetes mellitus, hypertension EXAM: ULTRASOUND ABDOMEN LIMITED RIGHT UPPER QUADRANT COMPARISON:  CT abdomen and pelvis 02/22/2020 FINDINGS: Gallbladder: Surgically absent Common bile duct: Diameter: 2 mm, normal Liver: Heterogeneous hepatic echogenicity with nodular margins consistent with cirrhosis. No discrete hepatic mass. Portal vein is patent on color Doppler imaging with normal direction of blood flow towards the liver. Other: Small amount of ascites. Image quality degraded secondary to body habitus. IMPRESSION: Post cholecystectomy. Cirrhotic appearing liver without mass. Small amount of ascites. Electronically Signed   By: Lavonia Dana M.D.   On: 03/05/2020 11:01    Labs:  CBC: Recent Labs    11/13/19 0338 11/24/19 1701 02/29/2020 0800 03/06/20 0522  WBC 13.8* 12.1* 12.2* 6.8  HGB 8.1* 11.1* 12.8 10.5*  HCT 26.6* 37.9 42.2 34.1*  PLT 204 211 74* 44*    COAGS: Recent Labs    10/30/19 0651 03/05/20 0413  INR 1.1 1.8*  APTT 35  --     BMP: Recent Labs    11/11/19 0211 11/12/19 0520 11/13/19 0338 11/24/19 1701 02/24/20 1500 03/13/2020 0800 02/20/2020 1600 03/05/20 0413 03/06/20 0522  NA 136 134* 133* 129*   < > 143 142 141 142  K 4.1 3.9 3.8 4.6   < > 2.0* 2.6* 3.2* 2.5*  CL 94* 93* 93* 89*   < > 97* 99 100 99  CO2 35* 32 33* 31   < > 32 30 28 31   GLUCOSE 48* 155* 127* 205*   < > 99 83 133* 73  BUN 22 21 20 12    < > 33* 35* 43* 44*  CALCIUM 8.8* 8.6* 8.3* 10.0   < > 9.0 8.6* 8.7* 8.5*  CREATININE 0.60 0.56 0.57 0.67   < > 1.14* 1.01* 1.18* 0.92  GFRNONAA >60 >60 >60 >60  --  53* >60 51* >60  GFRAA >60 >60 >60 >60  --   --   --   --   --    < > =  values in this interval not displayed.    LIVER FUNCTION TESTS: Recent Labs    03/02/20 1535 03/08/2020 0800 03/05/20 0413  03/06/20 0522  BILITOT 3.1* 4.0* 4.3* 3.5*  AST 115* 175* 322* 269*  ALT 106* 124* 215* 196*  ALKPHOS 223* 221* 186* 183*  PROT 5.3* 5.1* 4.6* 4.7*  ALBUMIN 2.9* 2.3* 2.4* 2.5*    TUMOR MARKERS: No results for input(s): AFPTM, CEA, CA199, CHROMGRNA in the last 8760 hours.  Assessment and Plan:  64 y/o F with history of chronic hilar lymphadenopathy admitted for worsening weakness, BLE edema and elevated LFTs found to have worsening hilar lymphadenopathy as well as new right supraclavicular lymphadenopathy concerning for possible malignancy. IR has been asked to perform a right supraclavicular lymph node biopsy to further evaluate these findings.  Patient history and imaging have been reviewed by Dr. Kathlene Cote who approves procedure for early next week - will plan for Monday 12/20 pending any emergent IR procedures which may delay biopsy until the following day (patient and primary team aware). Patient to be NPO at midnight on 12/20, Eliquis does not need to be held for lymph node biopsy, IR will call for patient when ready.  Risks and benefits of right supraclavicular lymph node biopsy was discussed with the patient and/or patient's family including, but not limited to bleeding, infection, damage to adjacent structures or low yield requiring additional tests.  All of the questions were answered and there is agreement to proceed.  Consent signed and in chart.  Thank you for this interesting consult.  I greatly enjoyed meeting TAILOR LUCKING and look forward to participating in their care.  A copy of this report was sent to the requesting provider on this date.  Electronically Signed: Joaquim Nam, PA-C 03/06/2020, 3:27 PM   I spent a total of 40 Minutes  in face to face in clinical consultation, greater than 50% of which was counseling/coordinating care for right supraclavicular lymph node biopsy.

## 2020-03-06 NOTE — Progress Notes (Signed)
Subjective: Feeling better. She is not as SOB.  Objective: Vital signs in last 24 hours: Temp:  [97.6 F (36.4 C)-98.2 F (36.8 C)] 98.2 F (36.8 C) (12/17 0620) Pulse Rate:  [95-105] 96 (12/17 0620) Resp:  [12-20] 20 (12/17 0620) BP: (124-159)/(58-78) 136/63 (12/17 0620) SpO2:  [94 %-100 %] 96 % (12/17 0845) FiO2 (%):  [28 %] 28 % (12/17 0845) Weight:  [124.9 kg] 124.9 kg (12/17 0619) Last BM Date: 03/05/20  Intake/Output from previous day: 12/16 0701 - 12/17 0700 In: 83.4 [IV Piggyback:83.4] Out: 1100 [Urine:1100] Intake/Output this shift: Total I/O In: 700.4 [P.O.:480; IV Piggyback:220.4] Out: -   General appearance: alert and no distress GI: soft, non-tender; bowel sounds normal; no masses,  no organomegaly Extremities: left first metatarsal erythema and skin breakdown, cyanotic skin in the other metatarsals  Lab Results: Recent Labs    03/10/2020 0800 03/06/20 0522  WBC 12.2* 6.8  HGB 12.8 10.5*  HCT 42.2 34.1*  PLT 74* 44*   BMET Recent Labs    03/02/2020 1600 03/05/20 0413 03/06/20 0522  NA 142 141 142  K 2.6* 3.2* 2.5*  CL 99 100 99  CO2 30 28 31   GLUCOSE 83 133* 73  BUN 35* 43* 44*  CREATININE 1.01* 1.18* 0.92  CALCIUM 8.6* 8.7* 8.5*   LFT Recent Labs    03/06/20 0522  PROT 4.7*  ALBUMIN 2.5*  AST 269*  ALT 196*  ALKPHOS 183*  BILITOT 3.5*  BILIDIR 1.9*  IBILI 1.6*   PT/INR Recent Labs    03/05/20 0413  LABPROT 20.5*  INR 1.8*   Hepatitis Panel Recent Labs    02/26/2020 1000  HEPBSAG NON REACTIVE  HCVAB NON REACTIVE  HEPAIGM NON REACTIVE  HEPBIGM NON REACTIVE   C-Diff No results for input(s): CDIFFTOX in the last 72 hours. Fecal Lactopherrin No results for input(s): FECLLACTOFRN in the last 72 hours.  Studies/Results: CT CHEST W CONTRAST  Result Date: 03/05/2020 CLINICAL DATA:  Shortness of breath and leg swelling. Worsening lymphadenopathy. EXAM: CT CHEST WITH CONTRAST TECHNIQUE: Multidetector CT imaging of the chest was  performed during intravenous contrast administration. CONTRAST:  80mL OMNIPAQUE IOHEXOL 300 MG/ML  SOLN COMPARISON:  Chest radiograph yesterday. Included portions from abdominal CT yesterday. Chest CT 11/03/2019 FINDINGS: Cardiovascular: Aortic atherosclerosis. Conventional branching pattern from the aortic arch. No filling defects in the central pulmonary arteries to the segmental level to suggest pulmonary embolus. Right hilar adenopathy causes narrowing of the right pulmonary arteries. Coronary artery calcifications versus stents. Heart is normal in size. Mediastinum/Nodes: Progressive right hilar and subcarinal adenopathy that appears bulky. Largest discrete right hilar node measures 2.1 cm short axis dimension, series 3, image 63. Subcarinal nodal conglomerate spans 4.3 x 4.1 cm, series 3, image 72. There are enlarged lower paratracheal nodes, measuring up to 2 cm, series 3, image 35. There is no left hilar adenopathy. Enlarged right supraclavicular nodes, 18 mm short axis, series 3, image 14. There is no axillary adenopathy. No internal mammary adenopathy. The esophagus is decompressed, midportion cannot be delineated from adjacent subcarinal lymph nodes. No suspicious thyroid nodule. Lungs/Pleura: Mild geographic ground-glass opacities which are most prominent in the right and left upper lobe, significant improvement from CT 4 months ago. Right hilar adenopathy causes circumferential narrowing of the right bronchus intermedius, middle lobe and lower lobe bronchus. Associated right lower lobe bronchial thickening. Linear atelectasis in both lower lobes. There is no dominant pulmonary mass. No pulmonary nodule other than a slightly nodular appearance of some  of the geographic ground-glass opacities. No pleural fluid. Minimal emphysema. Upper Abdomen: Upper abdominal ascites is seen on abdominal CT yesterday there is Peri celiac adenopathy, for example 2.6 mm node series 3, image 142 upper abdominal adenopathy  is difficult to delineate given phase of contrast. Musculoskeletal: No acute osseous abnormality. No blastic or destructive lytic lesion. Multiple remote right rib fractures. IMPRESSION: 1. Mediastinal and right hilar adenopathy, progressed from prior exam, suspicious for malignancy. Right hilar adenopathy causes circumferential narrowing of the bronchus intermedius, right lower and middle lobe bronchi. There is no discrete pulmonary nodule or mass to suggest primary pulmonary malignancy. There also enlarged lymph nodes in the right supraclavicular and upper abdomen in the celiac stations. Consider further workup with either PET-CT or tissue sampling. 2. Mild geographic ground-glass opacities in both upper lobes, significant improvement from CT 4 months ago. This may be related to pulmonary edema or atypical infection. The appearance is not typical for neoplasm. 3. Minimal emphysema. Aortic Atherosclerosis (ICD10-I70.0). Emphysema (ICD10-J43.9). Electronically Signed   By: Keith Rake M.D.   On: 03/05/2020 23:28   US Abdomen Limited RUQ (LIVER/GB)  Result Date: 03/05/2020 CLINICAL DATA:  Elevated LFTs, history COPD, type II diabetes mellitus, hypertension EXAM: ULTRASOUND ABDOMEN LIMITED RIGHT UPPER QUADRANT COMPARISON:  CT abdomen and pelvis 03/07/2020 FINDINGS: Gallbladder: Surgically absent Common bile duct: Diameter: 2 mm, normal Liver: Heterogeneous hepatic echogenicity with nodular margins consistent with cirrhosis. No discrete hepatic mass. Portal vein is patent on color Doppler imaging with normal direction of blood flow towards the liver. Other: Small amount of ascites. Image quality degraded secondary to body habitus. IMPRESSION: Post cholecystectomy. Cirrhotic appearing liver without mass. Small amount of ascites. Electronically Signed   By: Lavonia Dana M.D.   On: 03/05/2020 11:01    Medications:  Scheduled: . vitamin C  1,000 mg Oral Daily  . fluticasone furoate-vilanterol  1 puff  Inhalation Daily  . gabapentin  300 mg Oral Daily  . insulin aspart  0-15 Units Subcutaneous TID WC  . insulin aspart protamine- aspart  40 Units Subcutaneous BID WC  . ipratropium-albuterol  3 mL Nebulization Q6H  . lisinopril  10 mg Oral BID  . multivitamin with minerals  1 tablet Oral Daily  . nicotine  7 mg Transdermal Daily  . potassium chloride  40 mEq Oral Q2H  . sertraline  100 mg Oral Daily  . sodium chloride flush  3 mL Intravenous Q12H   Continuous: . sodium chloride    . albumin human 12.5 g (03/06/20 0908)    Assessment/Plan: 1) Abnormal liver enzymes - improving. 2) Decompensated cirrhosis - mild. 3) SOB - improved. 4) Skin breakdown of the left foot. 5) Decompensated diastolic CHF.   The patient is feeling better with her breathing.  Her liver enzymes are improving.  It is felt that she suffered a mild hepatic decompensation.  Treatment of her cardiac issues should resolve her elevated liver enzymes.  Her lower extremities do exhibit some mild wrinkling of her skin, but she still has a significant amount of edema.  Plan: 1) Continue with her medical management. 2) Follow liver panel. 3) Dr. Collene Mares is on call this weekend.  LOS: 2 days   Katielynn Horan D 03/06/2020, 1:00 PM

## 2020-03-06 NOTE — Plan of Care (Signed)

## 2020-03-06 NOTE — Progress Notes (Signed)
OT Cancellation Note  Patient Details Name: [name] [name] MRN: [id number] DOB: July 17, 1955   Cancelled Treatment:    Reason Eval/Treat Not Completed: Medical issues which prohibited therapy Pt's current potassium level is 2.5 and has not had repletion at this time. Will follow up as time allows and pt is appropriate.   [facility] OTR/L Acute Rehabilitation Services Office: [place] 03/06/2020, 7:41 AM

## 2020-03-06 NOTE — Progress Notes (Signed)
Nutrition Brief Note  RD consulted for assessment of nutritional requirements/ status.   Wt Readings from Last 15 Encounters:  03/06/20 124.9 kg  02/24/20 127.3 kg  01/27/20 114.4 kg  01/23/20 115.2 kg  01/13/20 113.6 kg  12/26/19 112.3 kg  12/23/19 111.4 kg  12/09/19 113.5 kg  11/16/19 123.6 kg  10/29/19 116.6 kg  10/28/19 117.5 kg  08/21/17 111.6 kg  07/24/17 112.5 kg  06/30/17 112.9 kg  06/26/17 113.2 kg   [name] [name] is a 64 y.o. female with medical history significant of COPD, PVD status post bilateral femoropopliteal bypass, chronic diastolic CHF, IDDM, paroxysmal A. Fib on Eliquis,, morbid obesity, chronic hilar lymphadenopathy, presented with worsening of generalized weakness, shortness of breath and increasing leg swelling.  Pt admitted with hypokaelmia.   Reviewed I/O's: -1.1 L x 24 hours and -670 ml since admission  UOP: 1.1 L x 24 hours  Spoke with pt at bedside, who was pleasant and in good spirits at time of visit. She reports feeling better and ate breakfast without difficulty (noted pt consumed 100% of tray). Pt shares that she was experiencing decreased oral intake for a bout 2 weeks PTA secondary to GI symptoms and food getting "hung up" in her throat. These symptoms have since resolved. Pt shares that she has been consuming softer foods PTA such as bananas and chicken and rice soup to combat this. Per pt, she now only has difficulty when consuming larger pills.   Pt denies any weight loss. She shares that her UBW is around 240# and acknowledges she has lost weight secondary to fluid retention. She reports that she has been less steady on her feet due to BLE edema and fell right before being admitted to the hospital.   Discussed with pt self-management of CHF and DM at home. Pt shares that she thinks that she was doing fairly well. Her CBGS are usually around 90-100 and she takes her insulin as prescribed. She tries to monitor carbohydrate intake, but also  enjoys fresh fruit, which she consumes daily. She also weighs herself daily for CHF. RD commended pt for these behaviors and encouraged her to continue. Explained to pt how DM is managed more aggressively in the hospital and relationship between acute illness/ stress response and hyperglycemia. Pt very appreciative of information.   Nutrition-Focused physical exam completed. Findings are no fat depletion, no muscle depletion, and moderate edema.   Medications reviewed and include vitamin C.   Lab Results  Component Value Date   HGBA1C 6.5 (H) 03/01/2020  PTA DM medications are 60 units insulin NPH- regular BID.   Labs reviewed: K: 2.5, CBGS: 89-122 (inpatient orders for glycemic control are 0-15 units insulin aspart TID with meals and 40 units inuslin aspart protamine-aspart BID).   Body mass index is 43.13 kg/m. Patient meets criteria for extreme obesity, class III based on current BMI. Obesity is a complex, chronic medical condition that is optimally managed by a multidisciplinary care team. Weight loss is not an ideal goal for an acute inpatient hospitalization. However, if further work-up for obesity is warranted, consider outpatient referral to outpatient bariatric service and/or [facility].    Current diet order is 2 gram sodium, patient is consuming approximately 100% of meals at this time. Labs and medications reviewed.   No nutrition interventions warranted at this time. If nutrition issues arise, please consult RD.   Loistine Chance, RD, LDN, [place] Registered Dietitian II Certified Diabetes Care and Education  Specialist Please refer to [facility] for RD and/or RD on-call/weekend/after hours pager

## 2020-03-06 NOTE — Progress Notes (Addendum)
PROGRESS NOTE  [name] [name]  DOB: January 02, 1956  PCP: Jacelyn Pi, MD EVO:[id number]  DOA: 03/10/2020  LOS: 2 days   Chief Complaint  Patient presents with  . Weakness    Brief narrative: [name] [name] is a 64 y.o. female with PMH significant for morbid obesity, DM2, HTN, HLD, HFpEF, paroxysmal A. fib on Eliquis, PAD s/p b/l fem-pop bypass, carotid artery stenosis, COPD, chronic hilar lymphadenopathy. Patient presented to the ED on 12/15 with progressive generalized weakness, shortness of breath and worsening leg swelling.  Patient was recently hospitalized in August 2021 for worsening of right lower extremity ischemia, underwent right-sided femoropopliteal bypass.  Since the surgery, patient has progressive bilateral leg swelling, right more than left.  She was started on Lasix 3 weeks ago by her PCP.  Leg swelling gradually improved but she has gotten progressively weaker, overall she feels very tired and has not been able to walk as much as before.  She also has several days of diarrhea.  In the ED, patient was afebrile, tachycardic to more than 100, blood pressure in 120s.  Labs showed potassium level low at 2, creatinine 1.14, AST/ALT/alk phos elevated to 175/124/221, platelets low at 74  CT scan of abdomen showed significant increase in the right hilar and subcarinal lymphadenopathy when compared with recent CT scan of chest from 2021.  Patient was admitted to hospitalist service. GI consultation was obtained.  Subjective: Patient was seen and examined this morning.   Lying on bed.  Not in distress.  No new symptoms.  Diarrhea improving.  Had soft consistency stool this morning. Continues to feel weak.  CT scan of chest from yesterday reviewed.  May need bronchoscopy ultrasound.  Assessment/Plan: Severe hypokalemia Hypophosphatemia -Secondary to use of Lasix as well as ongoing diarrhea -Potassium level continues to remain low.  More replacement ordered today.    Recent Labs  Lab 03/02/20 1535 03/10/2020 0800 02/29/2020 1000 03/10/2020 1600 03/05/20 0413 03/06/20 0522  K 2.4* 2.0*  --  2.6* 3.2* 2.5*  MG  --  2.3  --   --  2.3 2.2  PHOS  --   --  2.2*  --  3.7 3.0   Diarrhea -For last few days. -Improving.  Elevated liver enzymes New diagnosis of liver cirrhosis -Likely related to fatty liver disease and previous use of alcohol. -Right upper quadrant ultrasound showed cirrhotic appearing liver without mass and an small amount of ascites. -Acute hepatitis panel negative. -LFTs seem to be improving. -Statin on hold. Recent Labs  Lab 03/02/20 1535 03/08/2020 0800 03/05/20 0413 03/06/20 0522  AST 115* 175* 322* 269*  ALT 106* 124* 215* 196*  ALKPHOS 223* 221* 186* 183*  BILITOT 3.1* 4.0* 4.3* 3.5*  PROT 5.3* 5.1* 4.6* 4.7*  ALBUMIN 2.9* 2.3* 2.4* 2.5*   Progressive hilar lymphadenopathy -CT abdomen showed significant increase in the right hilar and subcarinal lymphadenopathy. -CT chest showed mediastinal and right hilar adenopathy causing circumferential narrowing of the bronchus intermedius, right lower and middle lobe bronchi suspicious for malignancy. -May need bronchoscopic ultrasound.  Pulmonary consultation called.  Diabetes mellitus type 2 -On 60 units twice daily of insulin 70/30 at home.  Unclear compliance. -Resume the same at 40 units twice daily.  Continue to monitor with sliding scale insulin and Accu-Cheks. Recent Labs  Lab 03/05/20 0739 03/05/20 1104 03/05/20 1616 03/05/20 2020 03/06/20 0749  GLUCAP 154* 148* 122* 108* 89   PAD s/p b/l fem-pop bypass, carotid artery stenosis Thrombocytopenia -Home meds include Eliquis 5  mg twice daily, aspirin 81 mg daily and statin. -Keep Eliquis on hold today because of worsening thrombocytopenia.  Statin remains on hold. -Also continue Neurontin 300 mg 3 times daily. Recent Labs  Lab 03/16/2020 0800 03/06/20 0522  PLT 74* 44*   COPD -Resume bronchodilators.  Chronic  diastolic CHF decompensation Essential hypertension -It seems that patient was taking HCTZ 25 mg daily and lisinopril 10 mg twice daily at home.  3 weeks prior, Lasix 40 mg daily was also added because of pedal edema. -At this time, patient has improving pedal edema.  She is not on acute CHF exacerbation. -Echo from August 2021 showed EF of 85%, grade 2 diastolic dysfunction, mild elevation of pulmonary systolic pressure. -Currently on 3 medications are on hold.  Profound hypokalemia.  Continue to monitor blood pressure.  Morbid obesity  -Body mass index is 43.13 kg/m. Patient has been advised to make an attempt to improve diet and exercise patterns to aid in weight loss.  History of psoriasis  -Uses clobetasol cream.  Not on systemic therapy.    Paroxysmal A. fib  -Currently in sinus rhythm with heart rate between 100 and 110.  -Continue anticoagulation with Eliquis.  Mobility: PT eval. Code Status:   Code Status: Full Code  Nutritional status: Body mass index is 43.13 kg/m.     Diet Order            Diet 2 gram sodium Room service appropriate? Yes; Fluid consistency: Thin  Diet effective now                 DVT prophylaxis: apixaban (ELIQUIS) tablet 5 mg   Antimicrobials:  None Fluid: None Consultants: GI Family Communication:  None at bedside  Status is: Inpatient  Remains inpatient appropriate because:Persistent severe electrolyte disturbances and Ongoing diagnostic testing needed not appropriate for outpatient work up  Dispo: The patient is from: Home              Anticipated d/c is to: Home most likely              Anticipated d/c date is: > 3 days              Patient currently is not medically stable to d/c.  Infusions:  . sodium chloride    . albumin human 12.5 g (03/06/20 0908)    Scheduled Meds: . apixaban  5 mg Oral BID  . vitamin C  1,000 mg Oral Daily  . fluticasone furoate-vilanterol  1 puff Inhalation Daily  . gabapentin  300 mg Oral Daily   . insulin aspart  0-15 Units Subcutaneous TID WC  . insulin aspart protamine- aspart  40 Units Subcutaneous BID WC  . ipratropium-albuterol  3 mL Nebulization Q6H  . lisinopril  10 mg Oral BID  . multivitamin with minerals  1 tablet Oral Daily  . nicotine  7 mg Transdermal Daily  . potassium chloride  40 mEq Oral Q2H  . sertraline  100 mg Oral Daily  . sodium chloride flush  3 mL Intravenous Q12H    Antimicrobials: Anti-infectives (From admission, onward)   None      PRN meds: sodium chloride, acetaminophen, albuterol, iohexol, ondansetron (ZOFRAN) IV, sodium chloride flush   Objective: Vitals:   03/06/20 0620 03/06/20 0845  BP: 136/63   Pulse: 96   Resp: 20   Temp: 98.2 F (36.8 C)   SpO2: 96% 96%    Intake/Output Summary (Last 24 hours) at 03/06/2020 1116 Last data filed  at 03/06/2020 1112 Gross per 24 hour  Intake 700.41 ml  Output 1100 ml  Net -399.59 ml   Filed Weights   03/19/2020 0747 03/06/20 0619  Weight: 117.9 kg 124.9 kg   Weight change: 6.965 kg Body mass index is 43.13 kg/m.   Physical Exam: General exam: Pleasant, elderly Caucasian female.  Morbidly obese.  Not in physical distress Skin: No rashes, lesions or ulcers. HEENT: Atraumatic, normocephalic, no obvious bleeding Lungs: Diminished air entry in both bases. CVS: Regular rate and rhythm, no murmur GI/Abd soft, distended obesity, nontender, bowel sound present CNS: Alert, awake, oriented x3.  Continues to feel weak. Psychiatry: Mood appropriate Extremities: Pedal edema improving bilaterally with skin wrinkling, left calf red which patient believes is secondary to psoriasis.  Data Review: I have personally reviewed the laboratory data and studies available.  Recent Labs  Lab 03/10/2020 0800 03/06/20 0522  WBC 12.2* 6.8  NEUTROABS 11.1* 5.9  HGB 12.8 10.5*  HCT 42.2 34.1*  MCV 78.4* 77.9*  PLT 74* 44*   Recent Labs  Lab 03/02/20 1535 03/12/2020 0800 03/10/2020 1000 03/08/2020 1600  03/05/20 0413 03/06/20 0522  NA 143 143  --  142 141 142  K 2.4* 2.0*  --  2.6* 3.2* 2.5*  CL 95* 97*  --  99 100 99  CO2 34* 32  --  '30 28 31  ' GLUCOSE 133* 99  --  83 133* 73  BUN 41* 33*  --  35* 43* 44*  CREATININE 1.01 1.14*  --  1.01* 1.18* 0.92  CALCIUM 8.6 9.0  --  8.6* 8.7* 8.5*  MG  --  2.3  --   --  2.3 2.2  PHOS  --   --  2.2*  --  3.7 3.0    F/u labs ordered.  Signed, Terrilee Croak, MD [facility] 03/06/2020

## 2020-03-06 NOTE — Consult Note (Signed)
NAME:  [name] [name], MRN:  [id number], DOB:  30-Jan-1956, LOS: 2 ADMISSION DATE:  03/10/2020, CONSULTATION DATE:  12/17 REFERRING MD:  Dr Pietro Cassis, CHIEF COMPLAINT:  Lung mass  Brief History:  64 year old female admitted with hypokalemia and abdominal pain. CT abdomen described lymphadenopathy prompting CT chest which found right hilar adenopathy with narrowing of bronchus intermedius with other less prominent areas of LAN. PCCM consulted for consideration of biopsy.  History of Present Illness:  64 year old female with PMH as below, which is significant for COPD, PAF on Eliquis, DM, HTN, and HFpEF. She is followed in the pulmonary clinic by Dr Erin Fulling for multifactorial dyspnea. She has been treated with lasix, nebs, and was planning for sleep study in January. After starting lasix, she had not been able to have labs done initially and was again requested to have labs done 12/11. Labs done 12/13 demonstrated hypokalemia and transaminitis and was referred to the ED for further workup considering potassium of 2.4. CT abdomen was done to work up LFT elevation and abdominal pain. CT abdomen described lymphadenopathy prompting CT chest which found right hilar adenopathy with narrowing of bronchus intermedius with other less prominent areas of LAN. PCCM was consulted for further evaluation of lymphadenopathy.  Past Medical History:   has a past medical history of Abdominal abscess, Allergic rhinitis, Arthritis, Asthma, Bacteremia (03/2017), Carotid stenosis, left (04/2009), Colon polyps (12/05), COPD (chronic obstructive pulmonary disease) ([place]), Depression, Diabetes mellitus, Dyspnea, Folate deficiency, Hyperlipidemia, Hypertension, Lumbar disc disease, Obesity, morbid (more than 100 lbs over ideal weight or BMI > 40) ([place]), Peripheral vascular disease ([place]), Psoriasis, Thrombocytopenia ([place]), and Tobacco abuse.   Significant Hospital Events:  12/15 admit  Consults:  PCCM IR  Procedures:     Significant Diagnostic Tests:  CT abdomen 12/15 > Significant increase in right hilar and subcarinal lymphadenopathy  when compared with the recent CT of the chest from August of 2021. CT of the chest ideally with contrast material is recommended for further evaluation. Fatty liver. New mild ascites and changes of anasarca Anterior abdominal wall hernia similar to that seen in 2014 containing loops of bowel without evidence of incarceration. US abdomen 12/16 > Post cholecystectomy. Cirrhotic appearing liver without mass. Small amount of ascites. CT chest 12/16 > Mediastinal and right hilar adenopathy, progressed from prior exam, suspicious for malignancy. Right hilar adenopathy causes circumferential narrowing of the bronchus intermedius, right lower and middle lobe bronchi. There is no discrete pulmonary nodule or mass to suggest primary pulmonary malignancy. There also enlarged lymph nodes in the right supraclavicular and upper abdomen in the celiac stations.   Micro Data:    Antimicrobials:     Interim History / Subjective:    Objective   Blood pressure 136/63, pulse 96, temperature 98.2 F (36.8 C), temperature source Oral, resp. rate 20, height 5\' 7"  (1.702 m), weight 124.9 kg, SpO2 96 %.    FiO2 (%):  [28 %] 28 %   Intake/Output Summary (Last 24 hours) at 03/06/2020 1359 Last data filed at 03/06/2020 1112 Gross per 24 hour  Intake 700.41 ml  Output 1100 ml  Net -399.59 ml   Filed Weights   02/27/2020 0747 03/06/20 0619  Weight: 117.9 kg 124.9 kg    Examination: General: obese female in NAD in bedside chair HENT: [place]/AT, PERRL, no JVD. Supraclavicular node not palpable Lungs: Bibasilar crackles, no distress Cardiovascular: RRR, no MRG Abdomen: Soft, non-tender, non-distneded Extremities: No acute deformity, 1+ edema bilateral lower extremities. Healing  skin tears to L foot.  Neuro: Alert, oriented, non-focal  Resolved Hospital Problem list     Assessment & Plan:    Hilar lymphadenopathy: with compression of bronchus intermedius.  - Ideal biopsy would be from R sided supraclavicular node - Should this not be amendable to IR sampling, could re-consider bronchoscopy, however, not without risk considering her multiple comorbidities.   COPD without acute exacerbation - nebulizers discontinued in the pulmonary office, can continue inpatient if she feels they are of benefit.  - She will need outpatient follow up with Dr. Erin Fulling  Sleep disordered breathing - outpatient sleep study pending.   HFpEF Hypokalemia DM2 - continued management per primary   Best practice (evaluated daily)  Diet: per primary Pain/Anxiety/Delirium protocol (if indicated): NA VAP protocol (if indicated): NA DVT prophylaxis: per primary GI prophylaxis: per primary Glucose control: per primary Mobility: per primary Disposition:Med-surg  Goals of Care:  Last date of multidisciplinary goals of care discussion: Family and staff present:  Summary of discussion:  Follow up goals of care discussion due:  Code Status: Full  Labs   CBC: Recent Labs  Lab 03/07/2020 0800 03/06/20 0522  WBC 12.2* 6.8  NEUTROABS 11.1* 5.9  HGB 12.8 10.5*  HCT 42.2 34.1*  MCV 78.4* 77.9*  PLT 74* 44*    Basic Metabolic Panel: Recent Labs  Lab 03/02/20 1535 03/10/2020 0800 03/10/2020 1000 02/24/2020 1600 03/05/20 0413 03/06/20 0522  NA 143 143  --  142 141 142  K 2.4* 2.0*  --  2.6* 3.2* 2.5*  CL 95* 97*  --  99 100 99  CO2 34* 32  --  30 28 31   GLUCOSE 133* 99  --  83 133* 73  BUN 41* 33*  --  35* 43* 44*  CREATININE 1.01 1.14*  --  1.01* 1.18* 0.92  CALCIUM 8.6 9.0  --  8.6* 8.7* 8.5*  MG  --  2.3  --   --  2.3 2.2  PHOS  --   --  2.2*  --  3.7 3.0   GFR: Estimated Creatinine Clearance: 82.5 mL/min (by C-G formula based on SCr of 0.92 mg/dL). Recent Labs  Lab 02/27/2020 0800 03/06/20 0522  WBC 12.2* 6.8    Liver Function Tests: Recent Labs  Lab 03/02/20 1535  03/08/2020 0800 03/05/20 0413 03/06/20 0522  AST 115* 175* 322* 269*  ALT 106* 124* 215* 196*  ALKPHOS 223* 221* 186* 183*  BILITOT 3.1* 4.0* 4.3* 3.5*  PROT 5.3* 5.1* 4.6* 4.7*  ALBUMIN 2.9* 2.3* 2.4* 2.5*   Recent Labs  Lab 03/02/2020 0800  LIPASE 41   No results for input(s): AMMONIA in the last 168 hours.  ABG    Component Value Date/Time   PHART 7.339 (L) 11/02/2019 2302   PCO2ART 43.1 11/02/2019 2302   PO2ART 38.2 (LL) 11/02/2019 2302   HCO3 22.7 11/02/2019 2302   TCO2 28 10/30/2019 1549   ACIDBASEDEF 2.3 (H) 11/02/2019 2302   O2SAT 69.7 11/02/2019 2302     Coagulation Profile: Recent Labs  Lab 03/05/20 0413  INR 1.8*    Cardiac Enzymes: Recent Labs  Lab 03/15/2020 1728  CKTOTAL 123    HbA1C: Hgb A1c MFr Bld  Date/Time Value Ref Range Status  02/21/2020 08:00 AM 6.5 (H) 4.8 - 5.6 % Final    Comment:    (NOTE) Pre diabetes:          5.7%-6.4%  Diabetes:              >  6.4%  Glycemic control for   <7.0% adults with diabetes   11/05/2019 12:43 AM 6.4 (H) 4.8 - 5.6 % Final    Comment:    (NOTE) Pre diabetes:          5.7%-6.4%  Diabetes:              >6.4%  Glycemic control for   <7.0% adults with diabetes     CBG: Recent Labs  Lab 03/05/20 1104 03/05/20 1616 03/05/20 2020 03/06/20 0749 03/06/20 1233  GLUCAP 148* 122* 108* 89 84    Review of Systems:    Bolds are positive  Constitutional: weight loss, gain, night sweats, Fevers, chills, fatigue .  HEENT: headaches, Sore throat, sneezing, nasal congestion, post nasal drip, Difficulty swallowing, Tooth/dental problems, visual complaints visual changes, ear ache CV:  chest pain, radiates:,Orthopnea, PND, swelling in lower extremities, dizziness, palpitations, syncope.  GI  heartburn, indigestion, abdominal pain, nausea, vomiting, diarrhea, change in bowel habits, loss of appetite, bloody stools.  Resp: cough, productive: , hemoptysis, dyspnea, chest pain, pleuritic.  Skin: rash or  itching or icterus GU: dysuria, change in color of urine, urgency or frequency. flank pain, hematuria  MS: joint pain or swelling. decreased range of motion  Psych: change in mood or affect. depression or anxiety.  Neuro: difficulty with speech, weakness, numbness, ataxia    Past Medical History:  She,  has a past medical history of Abdominal abscess, Allergic rhinitis, Arthritis, Asthma, Bacteremia (03/2017), Carotid stenosis, left (04/2009), Colon polyps (12/05), COPD (chronic obstructive pulmonary disease) ([place]), Depression, Diabetes mellitus, Dyspnea, Folate deficiency, Hyperlipidemia, Hypertension, Lumbar disc disease, Obesity, morbid (more than 100 lbs over ideal weight or BMI > 40) ([place]), Peripheral vascular disease ([place]), Psoriasis, Thrombocytopenia ([place]), and Tobacco abuse.   Surgical History:   Past Surgical History:  Procedure Laterality Date  . ABDOMINAL AORTOGRAM W/LOWER EXTREMITY N/A 02/28/2017   Procedure: ABDOMINAL AORTOGRAM W/LOWER EXTREMITY;  Surgeon: Serafina Mitchell, MD;  Location: [facility];  Service: Cardiovascular;  Laterality: N/A;  Bilateral  . ABDOMINAL AORTOGRAM W/LOWER EXTREMITY N/A 10/29/2019   Procedure: ABDOMINAL AORTOGRAM W/LOWER EXTREMITY;  Surgeon: Serafina Mitchell, MD;  Location: [facility];  Service: Cardiovascular;  Laterality: N/A;  . ANGIOPLASTY Right 10/30/2019   Procedure: RIGHT LEG BALLOON  ANGIOPLASTY SFA;  Surgeon: Serafina Mitchell, MD;  Location: [facility];  Service: Vascular;  Laterality: Right;  . BACK SURGERY  1989   LS  for HNP  . CHOLECYSTECTOMY  1982  . ENDARTERECTOMY Left 06/30/2017   Procedure: ENDARTERECTOMY CAROTID LEFT;  Surgeon: Serafina Mitchell, MD;  Location: [facility];  Service: Vascular;  Laterality: Left;  . FEMORAL-POPLITEAL BYPASS GRAFT Right 04/07/2017   Procedure: RIGHT FEMORAL-BELOW POPLITEAL ARTERY BYPASS WITH VEIN, RIGHT COMMON AND PRFUNDA FEMORAL ARTERY ENDARTERECTOMY WITH VEIN PATCH ANGIOPLASTY;  Surgeon: Serafina Mitchell, MD;  Location: [facility];  Service: Vascular;  Laterality: Right;  . FEMORAL-POPLITEAL BYPASS GRAFT Left 10/30/2019   Procedure: BYPASS GRAFT FEMORAL-POPLITEAL ARTERY;  Surgeon: Serafina Mitchell, MD;  Location: [facility];  Service: Vascular;  Laterality: Left;  . HERNIA REPAIR  2001   incisional  . INCISE AND DRAIN ABCESS  01/11/11   abd abscess     Social History:   reports that she quit smoking about 4 months ago. Her smoking use included cigarettes. She has a 94.00 pack-year smoking history. She has never used smokeless tobacco. She reports that she does not drink alcohol and does not use drugs.  Family History:  Her family history includes Alcohol abuse in her father; Asthma in her mother; Cancer in her sister and sister; Dementia in her sister; Hypertension in her mother; Stroke in her father; Suicidality in her brother and sister.   Allergies Allergies  Allergen Reactions  . Iohexol Rash    Pt states she had a rash 35-40 yrs ago during a procedure.  Benadryl was given and pt was fine in 30 mins. She has had multiple CT's since with premeds and has done fine.  No anaphylaxis per pt. I updated this record. [name], RTRCT  03/06/17  . Penicillins Rash and Other (See Comments)    PATIENT HAS HAD A PCN REACTION WITH IMMEDIATE RASH, FACIAL/TONGUE/THROAT SWELLING, SOB, OR LIGHTHEADEDNESS WITH HYPOTENSION:  #  #  YES  #  #  Has patient had a PCN reaction causing severe rash involving mucus membranes or skin necrosis: No Has patient had a PCN reaction that required hospitalization: No Has patient had a PCN reaction occurring within the last 10 years: No Can take amoxicillin     . Levaquin [Levofloxacin] Hives and Rash  . Morphine And Related Nausea And Vomiting    Makes her crawl out of her skin  . Sulfa Antibiotics Rash     Home Medications  Prior to Admission medications   Medication Sig Start Date End Date Taking? Authorizing Provider  albuterol (PROVENTIL HFA;VENTOLIN HFA)  108 (90 BASE) MCG/ACT inhaler Inhale 2 puffs into the lungs every 6 (six) hours as needed for wheezing.   Yes [provider]  apixaban (ELIQUIS) 5 MG TABS tablet Take 1 tablet (5 mg total) by mouth 2 (two) times daily. 11/16/19  Yes Baglia, Corrina, PA-C  Ascorbic Acid (VITAMIN C) 1000 MG tablet Take 1,000 mg by mouth daily.   Yes [provider]  aspirin 81 MG EC tablet Take 81 mg by mouth daily.   Yes [provider]  atorvastatin (LIPITOR) 20 MG tablet Take 1 tablet (20 mg total) by mouth at bedtime. Patient taking differently: Take 20 mg by mouth daily. 04/10/17  Yes Rai, Ripudeep K, MD  Cholecalciferol (VITAMIN D3) 1000 UNITS CAPS Take 1,000 Units by mouth 2 (two) times daily.   Yes [provider]  clobetasol ointment (TEMOVATE) 2.13 % Apply 1 application topically 2 (two) times daily as needed (psoriasis).  05/15/17  Yes [provider]  furosemide (LASIX) 40 MG tablet Take 40 mg by mouth daily.   Yes [provider]  gabapentin (NEURONTIN) 300 MG capsule Take 1 capsule (300 mg total) by mouth 3 (three) times daily. Patient taking differently: Take 300 mg by mouth daily. 04/10/17  Yes Rai, Ripudeep K, MD  hydrochlorothiazide (HYDRODIURIL) 25 MG tablet TAKE 1 TABLET DAILY Patient taking differently: Take 25 mg by mouth daily.   Yes Schoenhoff, Altamese Cabal, MD  insulin NPH-regular Human (NOVOLIN 70/30) (70-30) 100 UNIT/ML injection Inject 60 Units into the skin 2 (two) times daily.    Yes [provider]  lisinopril (ZESTRIL) 10 MG tablet Take 10 mg by mouth 2 (two) times daily. 10/25/19  Yes [provider]  Multiple Vitamin (MULTIVITAMIN WITH MINERALS) TABS tablet Take 1 tablet by mouth daily.   Yes [provider]  Multiple Vitamins-Minerals (ZINC PO) Take 1 tablet by mouth daily.   Yes [provider]  nicotine (NICODERM CQ - DOSED IN MG/24 HR) 7 mg/24hr patch Place 7 mg onto the skin daily.   Yes [provider]  Potassium Chloride ER 20 MEQ TBCR Take 2 tablets now and 2 tablets this evening.  Take 2 tablets daily starting tomorrow 02/25/2020. Patient taking differently: Take 20 mEq by mouth daily. 02/24/20  Yes Freddi Starr, MD  sertraline (ZOLOFT) 100 MG tablet Take 100 mg by mouth once daily Patient taking differently: Take 100 mg by mouth daily. 04/10/17  Yes Rai, Vernelle Emerald, MD      Georgann Housekeeper, AGACNP-BC [place] for personal pager PCCM on call pager [phone]  03/06/2020 2:32 PM

## 2020-03-06 NOTE — Progress Notes (Signed)
PT Cancellation Note  Patient Details Name: [name] [name] MRN: [id number] DOB: 1955/06/15   Cancelled Treatment:    Reason Eval/Treat Not Completed: Medical issues which prohibited therapy (pt with current K of 2.5 and currently without repletion)   Nael Petrosyan B Azlan Hanway 03/06/2020, 7:02 AM  [name], PT Acute Rehabilitation Services Pager: [phone] Office: [phone]

## 2020-03-06 NOTE — Evaluation (Signed)
Physical Therapy Evaluation Patient Details Name: [name] [name] MRN: [id number] DOB: 1955-09-14 Today's Date: 03/06/2020   History of Present Illness  64 y/o F presenting to ED on 12/15 for multiple falls over the week, diarrhea, and B LE pitting edema. Has had increasing weakness over the past few weeks. Found to have hypokalemia. PMHx: asthma, PVD, COPD, DM II, dyspnea, HTN, HLD, tobacco use, obesity, PVD.  Clinical Impression  Pt presents with decreased cognition, LE strength and endurance, cardiopulmonary status with mobility, and functional mobility level. Required assist for all mobility and needed significant +2 assistance with transfers due to lack of power up. Session limited by pts elevated HR that got up to 140 with minimal mobility. Pt would benefit from continued acute PT in order to work on increasing the above impairments. D/c to a SNF would be appropriate for this pt due to decreased caregiver support, increased assistance needed, and being unsafe to return home Rest: 96, 103 bpm 2L EOB 130 bpm, 2L Sitting on commode after walking 22 feet in room: 131 bpm 6 feet from commode to reclienr: 130-140 bpm, 98% SpO2 Resting on 2L in recliner : 117 bpm     Follow Up Recommendations SNF;Supervision/Assistance - 24 hour    Equipment Recommendations  Rolling walker with 5" wheels;3in1 (PT)    Recommendations for Other Services OT consult     Precautions / Restrictions Precautions Precautions: Fall Precaution Comments: watch HR and SpO2 Restrictions Weight Bearing Restrictions: No      Mobility  Bed Mobility Overal bed mobility: Needs Assistance Bed Mobility: Supine to Sit     Supine to sit: HOB elevated;Mod assist     General bed mobility comments: 1 person hand held assist needed for trunk elevation    Transfers Overall transfer level: Needs assistance Equipment used: Rolling walker (2 wheeled) Transfers: Sit to/from Stand Sit to Stand: Mod assist;+2 physical  assistance;From elevated surface         General transfer comment: 2 attempts sit to stand from bed, first attempt failed due to lack of power up; needed bilateral knees blocked and assist with power up  Ambulation/Gait Ambulation/Gait assistance: Min guard Gait Distance (Feet): 16 Feet Assistive device: Rolling walker (2 wheeled) Gait Pattern/deviations: Step-to pattern;Decreased stride length;Wide base of support   Gait velocity interpretation: <1.31 ft/sec, indicative of household ambulator General Gait Details: 16 feet to bathroom, 6 feet to chair from commode  Stairs            Wheelchair Mobility    Modified Rankin (Stroke Patients Only)       Balance Overall balance assessment: Needs assistance Sitting-balance support: Bilateral upper extremity supported;Feet supported Sitting balance-Leahy Scale: Poor     Standing balance support: Bilateral upper extremity supported Standing balance-Leahy Scale: Poor Standing balance comment: reliant on bil UE support of RW                             Pertinent Vitals/Pain Pain Assessment: Faces Faces Pain Scale: Hurts a little bit Pain Location: back Pain Intervention(s): Monitored during session;Repositioned    Home Living Family/patient expects to be discharged to:: Private residence Living Arrangements: Children;Other (Comment) (childs husband) Available Help at Discharge: Family;Available PRN/intermittently Type of Home: Apartment Home Access: Stairs to enter Entrance Stairs-Rails: None Entrance Stairs-Number of Steps: 3 Home Layout: One level Home Equipment: Cane - single point;Walker - 2 wheels;Bedside commode;Shower seat      Prior Function Level of Independence:  Independent with assistive device(s)         Comments: trades with cooking and cleaning, gets around with AD's     Hand Dominance   Dominant Hand: Right    Extremity/Trunk Assessment   Upper Extremity Assessment Upper  Extremity Assessment: Defer to OT evaluation    Lower Extremity Assessment Lower Extremity Assessment: Overall WFL for tasks assessed    Cervical / Trunk Assessment Cervical / Trunk Assessment: Normal  Communication   Communication: No difficulties  Cognition Arousal/Alertness: Awake/alert Behavior During Therapy: WFL for tasks assessed/performed Overall Cognitive Status: Impaired/Different from baseline Area of Impairment: Orientation;Safety/judgement                 Orientation Level: Disoriented to;Time       Safety/Judgement: Decreased awareness of safety;Decreased awareness of deficits     General Comments: goes on tangents      General Comments General comments (skin integrity, edema, etc.): Bil LE pitting edema; bleeding wounds on the L foot that nursing was notified about    Exercises     Assessment/Plan    PT Assessment Patient needs continued PT services  PT Problem List Decreased cognition;Decreased strength;Decreased knowledge of use of DME;Decreased safety awareness;Decreased skin integrity;Pain;Decreased activity tolerance;Decreased balance;Decreased mobility;Cardiopulmonary status limiting activity       PT Treatment Interventions Balance training;Gait training;Cognitive remediation;DME instruction;Patient/family education;Functional mobility training;Therapeutic activities;Therapeutic exercise    PT Goals (Current goals can be found in the Care Plan section)  Acute Rehab PT Goals Patient Stated Goal: be home for the holidays PT Goal Formulation: With patient Time For Goal Achievement: 03/20/20 Potential to Achieve Goals: Fair    Frequency Min 2X/week   Barriers to discharge Decreased caregiver support daughter only available PRN, pt was crawling around on floor to get places after fall when daughter wasn't there    Co-evaluation               AM-PAC PT "6 Clicks" Mobility  Outcome Measure Help needed turning from your back to your  side while in a flat bed without using bedrails?: A Lot Help needed moving from lying on your back to sitting on the side of a flat bed without using bedrails?: A Lot Help needed moving to and from a bed to a chair (including a wheelchair)?: A Lot Help needed standing up from a chair using your arms (e.g., wheelchair or bedside chair)?: Total Help needed to walk in hospital room?: A Little Help needed climbing 3-5 steps with a railing? : Total 6 Click Score: 11    End of Session Equipment Utilized During Treatment: Gait belt Activity Tolerance: Treatment limited secondary to medical complications (Comment);Other (comment) (hypokalemia/increased HR) Patient left: in chair;with call bell/phone within reach;with chair alarm set Nurse Communication: Mobility status;Other (comment) (L foot wounds) PT Visit Diagnosis: Repeated falls (R29.6);Unsteadiness on feet (R26.81);Difficulty in walking, not elsewhere classified (R26.2);Pain Pain - part of body:  (generalized)    Time: 4742-5956 PT Time Calculation (min) (ACUTE ONLY): 43 min   Charges:   PT Evaluation $PT Eval Moderate Complexity: 1 Mod PT Treatments $Gait Training: 8-22 mins $Therapeutic Activity: 8-22 mins        Caleb Popp, SPT [id number]  Dionne Rossa 03/06/2020, 1:41 PM

## 2020-03-07 LAB — CBC WITH DIFFERENTIAL/PLATELET
Abs Immature Granulocytes: 0.1 10*3/uL — ABNORMAL HIGH (ref 0.00–0.07)
Basophils Absolute: 0 10*3/uL (ref 0.0–0.1)
Basophils Relative: 0 %
Eosinophils Absolute: 0 10*3/uL (ref 0.0–0.5)
Eosinophils Relative: 0 %
HCT: 34.1 % — ABNORMAL LOW (ref 36.0–46.0)
Hemoglobin: 11 g/dL — ABNORMAL LOW (ref 12.0–15.0)
Immature Granulocytes: 1 %
Lymphocytes Relative: 8 %
Lymphs Abs: 0.6 10*3/uL — ABNORMAL LOW (ref 0.7–4.0)
MCH: 24.9 pg — ABNORMAL LOW (ref 26.0–34.0)
MCHC: 32.3 g/dL (ref 30.0–36.0)
MCV: 77.1 fL — ABNORMAL LOW (ref 80.0–100.0)
Monocytes Absolute: 0.3 10*3/uL (ref 0.1–1.0)
Monocytes Relative: 5 %
Neutro Abs: 6 10*3/uL (ref 1.7–7.7)
Neutrophils Relative %: 86 %
Platelets: 42 10*3/uL — ABNORMAL LOW (ref 150–400)
RBC: 4.42 MIL/uL (ref 3.87–5.11)
RDW: 24.1 % — ABNORMAL HIGH (ref 11.5–15.5)
WBC: 7 10*3/uL (ref 4.0–10.5)
nRBC: 0.7 % — ABNORMAL HIGH (ref 0.0–0.2)

## 2020-03-07 LAB — BASIC METABOLIC PANEL
Anion gap: 12 (ref 5–15)
BUN: 42 mg/dL — ABNORMAL HIGH (ref 8–23)
CO2: 30 mmol/L (ref 22–32)
Calcium: 9.1 mg/dL (ref 8.9–10.3)
Chloride: 100 mmol/L (ref 98–111)
Creatinine, Ser: 1.02 mg/dL — ABNORMAL HIGH (ref 0.44–1.00)
GFR, Estimated: >60 mL/min (ref >60)
Glucose, Bld: 129 mg/dL — ABNORMAL HIGH (ref 70–99)
Potassium: 3.6 mmol/L (ref 3.5–5.1)
Sodium: 142 mmol/L (ref 135–145)

## 2020-03-07 LAB — HEPATIC FUNCTION PANEL
ALT: 179 U/L — ABNORMAL HIGH (ref 0–44)
AST: 227 U/L — ABNORMAL HIGH (ref 15–41)
Albumin: 2.9 g/dL — ABNORMAL LOW (ref 3.5–5.0)
Alkaline Phosphatase: 205 U/L — ABNORMAL HIGH (ref 38–126)
Bilirubin, Direct: 2.7 mg/dL — ABNORMAL HIGH (ref 0.0–0.2)
Indirect Bilirubin: 2.1 mg/dL — ABNORMAL HIGH (ref 0.3–0.9)
Total Bilirubin: 4.8 mg/dL — ABNORMAL HIGH (ref 0.3–1.2)
Total Protein: 5 g/dL — ABNORMAL LOW (ref 6.5–8.1)

## 2020-03-07 LAB — GLUCOSE, CAPILLARY
Glucose-Capillary: 150 mg/dL — ABNORMAL HIGH (ref 70–99)
Glucose-Capillary: 181 mg/dL — ABNORMAL HIGH (ref 70–99)
Glucose-Capillary: 173 mg/dL — ABNORMAL HIGH (ref 70–99)
Glucose-Capillary: 163 mg/dL — ABNORMAL HIGH (ref 70–99)

## 2020-03-07 NOTE — Progress Notes (Signed)
PROGRESS NOTE  [name] [name]  DOB: 24-Jun-1955  PCP: Jacelyn Pi, MD ASN:[id number]  DOA: 03/16/2020  LOS: 3 days   Chief Complaint  Patient presents with  . Weakness    Brief narrative: [name] [name] is a 64 y.o. female with PMH significant for morbid obesity, DM2, HTN, HLD, HFpEF, paroxysmal A. fib on Eliquis, PAD s/p b/l fem-pop bypass, carotid artery stenosis, COPD, chronic hilar lymphadenopathy. Patient presented to the ED on 12/15 with progressive generalized weakness, shortness of breath and worsening leg swelling.  Patient was recently hospitalized in August 2021 for worsening of right lower extremity ischemia, underwent right-sided femoropopliteal bypass.  Since the surgery, patient has progressive bilateral leg swelling, right more than left.  She was started on Lasix 3 weeks ago by her PCP.  Leg swelling gradually improved but she has gotten progressively weaker, overall she feels very tired and has not been able to walk as much as before.  She also has several days of diarrhea.  In the ED, patient was afebrile, tachycardic to more than 100, blood pressure in 120s.  Labs showed potassium level low at 2, creatinine 1.14, AST/ALT/alk phos elevated to 175/124/221, platelets low at 74  CT scan of abdomen showed significant increase in the right hilar and subcarinal lymphadenopathy when compared with recent CT scan of chest from 2021.  Patient was admitted to hospitalist service. GI consultation was obtained.  Subjective: Patient was seen and examined this morning.   Sitting up in chair.  Not in distress.  On low-flow oxygen.  Diarrhea improved. Noted a plan from IR for lymph node biopsy on Monday.  Assessment/Plan: Severe hypokalemia Hypophosphatemia -Secondary to use of Lasix as well as ongoing diarrhea -Potassium level improving.  Recent Labs  Lab 02/20/2020 0800 03/05/2020 1000 03/06/2020 1600 03/05/20 0413 03/06/20 0522 03/07/20 0240  K 2.0*  --  2.6* 3.2*  2.5* 3.6  MG 2.3  --   --  2.3 2.2  --   PHOS  --  2.2*  --  3.7 3.0  --    Diarrhea -Improved.  Elevated liver enzymes New diagnosis of liver cirrhosis -Likely related to fatty liver disease and previous use of alcohol. -Right upper quadrant ultrasound showed cirrhotic appearing liver without mass and an small amount of ascites. -Acute hepatitis panel negative. -LFTs trend as below.  AST and ALT improving but alk phos remains elevated -Statin on hold. Recent Labs  Lab 03/02/20 1535 03/02/2020 0800 03/05/20 0413 03/06/20 0522 03/07/20 0240  AST 115* 175* 322* 269* 227*  ALT 106* 124* 215* 196* 179*  ALKPHOS 223* 221* 186* 183* 205*  BILITOT 3.1* 4.0* 4.3* 3.5* 4.8*  PROT 5.3* 5.1* 4.6* 4.7* 5.0*  ALBUMIN 2.9* 2.3* 2.4* 2.5* 2.9*   Progressive hilar lymphadenopathy Right supraclavicular lymphadenopathy -CT abdomen showed significant increase in the right hilar and subcarinal lymphadenopathy. -CT chest showed mediastinal and right hilar adenopathy causing circumferential narrowing of the bronchus intermedius, right lower and middle lobe bronchi suspicious for malignancy. -PCCM consult appreciated.  Patient also has right supraclavicular lymphadenopathy.  IR consulted.  Noted a plan for biopsy on Monday. Diabetes mellitus type 2 -On 60 units twice daily of insulin 70/30 at home.  Unclear compliance. -Currently on 40 units twice daily of insulin 70/30.  Blood sugar remains consistently under 200.  Continue the same.  Continue continue to monitor with sliding scale insulin and Accu-Cheks. Recent Labs  Lab 03/06/20 1233 03/06/20 1606 03/06/20 2101 03/07/20 0822 03/07/20 1149  GLUCAP 84  141* 122* 150* 181*   PAD s/p b/l fem-pop bypass, carotid artery stenosis Thrombocytopenia -Home meds include Eliquis 5 mg twice daily, aspirin 81 mg daily and statin. -Keep Eliquis on hold today because of worsening thrombocytopenia.  Statin remains on hold. -Also continue Neurontin 300 mg 3  times daily. Recent Labs  Lab 03/01/2020 0800 03/06/20 0522 03/07/20 0240  PLT 74* 44* 42*   COPD -Resume bronchodilators.  Chronic diastolic CHF decompensation Essential hypertension -It seems that patient was taking HCTZ 25 mg daily and lisinopril 10 mg twice daily at home.  3 weeks prior to admission, Lasix 40 mg daily was also added because of pedal edema. -At this time, patient has improving pedal edema.  She is not on acute CHF exacerbation. -Echo from August 2021 showed EF of 44%, grade 2 diastolic dysfunction, mild elevation of pulmonary systolic pressure. -Currently all 3 medications are on hold because of profound hypokalemia.   -Blood pressure remains stable.    Morbid obesity  -Body mass index is 43.13 kg/m. Patient has been advised to make an attempt to improve diet and exercise patterns to aid in weight loss.  History of psoriasis  -Uses clobetasol cream.  Not on systemic therapy.    Paroxysmal A. fib  -Currently in sinus rhythm with heart rate between 100 and 110.  -Continue anticoagulation with Eliquis.  Mobility: PT eval. Code Status:   Code Status: Full Code  Nutritional status: Body mass index is 43.13 kg/m.     Diet Order            Diet 2 gram sodium Room service appropriate? Yes; Fluid consistency: Thin  Diet effective now                 DVT prophylaxis:   Antimicrobials:  None Fluid: None Consultants: GI Family Communication:  None at bedside  Status is: Inpatient  Remains inpatient appropriate because-pending node biopsy by IR  Dispo: The patient is from: Home              Anticipated d/c is to: Home most likely              Anticipated d/c date is: > 3 days              Patient currently is not medically stable to d/c.  Infusions:  . sodium chloride      Scheduled Meds: . vitamin C  1,000 mg Oral Daily  . fluticasone furoate-vilanterol  1 puff Inhalation Daily  . gabapentin  300 mg Oral Daily  . insulin aspart  0-15 Units  Subcutaneous TID WC  . insulin aspart protamine- aspart  40 Units Subcutaneous BID WC  . ipratropium-albuterol  3 mL Nebulization Q6H  . lisinopril  10 mg Oral BID  . multivitamin with minerals  1 tablet Oral Daily  . nicotine  7 mg Transdermal Daily  . sertraline  100 mg Oral Daily  . sodium chloride flush  3 mL Intravenous Q12H    Antimicrobials: Anti-infectives (From admission, onward)   None      PRN meds: sodium chloride, acetaminophen, albuterol, iohexol, ondansetron (ZOFRAN) IV, sodium chloride flush   Objective: Vitals:   03/07/20 0237 03/07/20 0730  BP:    Pulse:    Resp:    Temp:    SpO2: 98% 95%   No intake or output data in the 24 hours ending 03/07/20 1408 Filed Weights   03/07/2020 0747 03/06/20 0619  Weight: 117.9 kg 124.9 kg  Weight change:  Body mass index is 43.13 kg/m.   Physical Exam: General exam: Pleasant, elderly Caucasian female.  Morbidly obese.  Not in physical distress Skin: No rashes, lesions or ulcers. HEENT: Atraumatic, normocephalic, no obvious bleeding Lungs: Clear to auscultation bilaterally CVS: Regular rate and rhythm, no murmur GI/Abd soft, distended obesity, nontender, bowel sound present CNS: Alert, awake, oriented x3.   Psychiatry: Mood appropriate Extremities: Pedal edema improving bilaterally with skin wrinkling, left calf red which patient believes is secondary to psoriasis.  Data Review: I have personally reviewed the laboratory data and studies available.  Recent Labs  Lab 02/20/2020 0800 03/06/20 0522 03/07/20 0240  WBC 12.2* 6.8 7.0  NEUTROABS 11.1* 5.9 6.0  HGB 12.8 10.5* 11.0*  HCT 42.2 34.1* 34.1*  MCV 78.4* 77.9* 77.1*  PLT 74* 44* 42*   Recent Labs  Lab 02/26/2020 0800 03/15/2020 1000 02/20/2020 1600 03/05/20 0413 03/06/20 0522 03/07/20 0240  NA 143  --  142 141 142 142  K 2.0*  --  2.6* 3.2* 2.5* 3.6  CL 97*  --  99 100 99 100  CO2 32  --  '30 28 31 30  ' GLUCOSE 99  --  83 133* 73 129*  BUN 33*  --   35* 43* 44* 42*  CREATININE 1.14*  --  1.01* 1.18* 0.92 1.02*  CALCIUM 9.0  --  8.6* 8.7* 8.5* 9.1  MG 2.3  --   --  2.3 2.2  --   PHOS  --  2.2*  --  3.7 3.0  --     F/u labs ordered.  Signed, Terrilee Croak, MD [facility] 03/07/2020

## 2020-03-07 NOTE — Plan of Care (Signed)

## 2020-03-08 LAB — CBC WITH DIFFERENTIAL/PLATELET
Abs Immature Granulocytes: 0.07 10*3/uL (ref 0.00–0.07)
Basophils Absolute: 0 10*3/uL (ref 0.0–0.1)
Basophils Relative: 0 %
Eosinophils Absolute: 0 10*3/uL (ref 0.0–0.5)
Eosinophils Relative: 0 %
HCT: 33.7 % — ABNORMAL LOW (ref 36.0–46.0)
Hemoglobin: 10.3 g/dL — ABNORMAL LOW (ref 12.0–15.0)
Immature Granulocytes: 1 %
Lymphocytes Relative: 7 %
Lymphs Abs: 0.4 10*3/uL — ABNORMAL LOW (ref 0.7–4.0)
MCH: 24.3 pg — ABNORMAL LOW (ref 26.0–34.0)
MCHC: 30.6 g/dL (ref 30.0–36.0)
MCV: 79.5 fL — ABNORMAL LOW (ref 80.0–100.0)
Monocytes Absolute: 0.3 10*3/uL (ref 0.1–1.0)
Monocytes Relative: 5 %
Neutro Abs: 5.6 10*3/uL (ref 1.7–7.7)
Neutrophils Relative %: 87 %
Platelets: 43 10*3/uL — ABNORMAL LOW (ref 150–400)
RBC: 4.24 MIL/uL (ref 3.87–5.11)
RDW: 24.1 % — ABNORMAL HIGH (ref 11.5–15.5)
WBC: 6.3 10*3/uL (ref 4.0–10.5)
nRBC: 1.1 % — ABNORMAL HIGH (ref 0.0–0.2)

## 2020-03-08 LAB — BASIC METABOLIC PANEL WITH GFR
Anion gap: 10 (ref 5–15)
BUN: 38 mg/dL — ABNORMAL HIGH (ref 8–23)
CO2: 33 mmol/L — ABNORMAL HIGH (ref 22–32)
Calcium: 9.6 mg/dL (ref 8.9–10.3)
Chloride: 101 mmol/L (ref 98–111)
Creatinine, Ser: 0.92 mg/dL (ref 0.44–1.00)
GFR, Estimated: >60 mL/min
Glucose, Bld: 144 mg/dL — ABNORMAL HIGH (ref 70–99)
Potassium: 3.3 mmol/L — ABNORMAL LOW (ref 3.5–5.1)
Sodium: 144 mmol/L (ref 135–145)

## 2020-03-08 LAB — HEPATIC FUNCTION PANEL
ALT: 147 U/L — ABNORMAL HIGH (ref 0–44)
AST: 166 U/L — ABNORMAL HIGH (ref 15–41)
Albumin: 2.9 g/dL — ABNORMAL LOW (ref 3.5–5.0)
Alkaline Phosphatase: 186 U/L — ABNORMAL HIGH (ref 38–126)
Bilirubin, Direct: 2.8 mg/dL — ABNORMAL HIGH (ref 0.0–0.2)
Indirect Bilirubin: 1.7 mg/dL — ABNORMAL HIGH (ref 0.3–0.9)
Total Bilirubin: 4.5 mg/dL — ABNORMAL HIGH (ref 0.3–1.2)
Total Protein: 5.2 g/dL — ABNORMAL LOW (ref 6.5–8.1)

## 2020-03-08 LAB — GLUCOSE, CAPILLARY
Glucose-Capillary: 128 mg/dL — ABNORMAL HIGH (ref 70–99)
Glucose-Capillary: 103 mg/dL — ABNORMAL HIGH (ref 70–99)
Glucose-Capillary: 161 mg/dL — ABNORMAL HIGH (ref 70–99)
Glucose-Capillary: 64 mg/dL — ABNORMAL LOW (ref 70–99)

## 2020-03-08 MED ORDER — POTASSIUM CHLORIDE CRYS ER 20 MEQ PO TBCR
40.0000 meq | EXTENDED_RELEASE_TABLET | Freq: Once | ORAL | Status: AC
  Administered 2020-03-08: 40 meq via ORAL
  Filled 2020-03-08: qty 2

## 2020-03-08 NOTE — Progress Notes (Addendum)
PROGRESS NOTE  [name] [name]  DOB: 1955-04-06  PCP: Jacelyn Pi, MD ONG:[id number]  DOA: 02/19/2020  LOS: 4 days   Chief Complaint  Patient presents with   Weakness    Brief narrative: [name] [name] is a 64 y.o. female with PMH significant for morbid obesity, DM2, HTN, HLD, HFpEF, paroxysmal A. fib on Eliquis, PAD s/p b/l fem-pop bypass, carotid artery stenosis, COPD, chronic hilar lymphadenopathy. Patient presented to the ED on 12/15 with progressive generalized weakness, shortness of breath and worsening leg swelling.  Patient was recently hospitalized in August 2021 for worsening of right lower extremity ischemia, underwent right-sided femoropopliteal bypass.  Since the surgery, patient has progressive bilateral leg swelling, right more than left.  She was started on Lasix 3 weeks ago by her PCP.  Leg swelling gradually improved but she has gotten progressively weaker, overall she feels very tired and has not been able to walk as much as before.  She also has several days of diarrhea.  In the ED, patient was afebrile, tachycardic to more than 100, blood pressure in 120s.  Labs showed potassium level low at 2, creatinine 1.14, AST/ALT/alk phos elevated to 175/124/221, platelets low at 74  CT scan of abdomen showed significant increase in the right hilar and subcarinal lymphadenopathy when compared with recent CT scan of chest from 2021.  Patient was admitted to hospitalist service. GI consultation was obtained.  Subjective: Patient was seen and examined this morning.   Sitting up at the edge of the bed.  Not in distress.  On low-flow oxygen by nasal cannula.  No new symptoms.  Assessment/Plan: Severe hypokalemia Hypophosphatemia -Secondary to use of Lasix as well as ongoing diarrhea -Potassium level improving with daily replacement.  Level low at 3.3 today.  Replacement given today as well. Recent Labs  Lab 03/08/2020 0800 02/19/2020 1000 03/08/2020 1600 03/05/20 0413  03/06/20 0522 03/07/20 0240 03/08/20 0041  K 2.0*  --  2.6* 3.2* 2.5* 3.6 3.3*  MG 2.3  --   --  2.3 2.2  --   --   PHOS  --  2.2*  --  3.7 3.0  --   --    Diarrhea -Improved.  Elevated liver enzymes New diagnosis of liver cirrhosis -Likely related to fatty liver disease and previous use of alcohol. -Right upper quadrant ultrasound showed cirrhotic appearing liver without mass and an small amount of ascites. -Acute hepatitis panel negative. -LFTs trend as below.  AST and ALT improving but alk phos remains elevated -Statin on hold. Recent Labs  Lab 03/12/2020 0800 03/05/20 0413 03/06/20 0522 03/07/20 0240 03/08/20 0041  AST 175* 322* 269* 227* 166*  ALT 124* 215* 196* 179* 147*  ALKPHOS 221* 186* 183* 205* 186*  BILITOT 4.0* 4.3* 3.5* 4.8* 4.5*  PROT 5.1* 4.6* 4.7* 5.0* 5.2*  ALBUMIN 2.3* 2.4* 2.5* 2.9* 2.9*   Progressive hilar lymphadenopathy Right supraclavicular lymphadenopathy -CT abdomen showed significant increase in the right hilar and subcarinal lymphadenopathy. -CT chest showed mediastinal and right hilar adenopathy causing circumferential narrowing of the bronchus intermedius, right lower and middle lobe bronchi suspicious for malignancy. -PCCM consult appreciated.  Patient also has right supraclavicular lymphadenopathy.  IR consulted.  Noted a plan for biopsy on Monday. Diabetes mellitus type 2 -On 60 units twice daily of insulin 70/30 at home.  Unclear compliance. -Currently on 40 units twice daily of insulin 70/30.  Blood sugar remains consistently under 200.  Continue the same.  Continue continue to monitor with sliding scale  insulin and Accu-Cheks. Recent Labs  Lab 03/07/20 0822 03/07/20 1149 03/07/20 1631 03/07/20 2151 03/08/20 0830  GLUCAP 150* 181* 173* 163* 128*   PAD s/p b/l fem-pop bypass, carotid artery stenosis Thrombocytopenia -Home meds include Eliquis 5 mg twice daily, aspirin 81 mg daily and statin. -Keep Eliquis on hold today because of  worsening thrombocytopenia.  Statin remains on hold. -Also continue Neurontin 300 mg 3 times daily. Recent Labs  Lab 03/18/2020 0800 03/06/20 0522 03/07/20 0240 03/08/20 0041  PLT 74* 44* 42* 43*   COPD -Resume bronchodilators.  Chronic diastolic CHF decompensation Essential hypertension -It seems that patient was taking HCTZ 25 mg daily and lisinopril 10 mg twice daily at home.  3 weeks prior to admission, Lasix 40 mg daily was also added because of pedal edema. -At this time, patient has improving pedal edema.  She is not on acute CHF exacerbation. -Echo from August 2021 showed EF of 42%, grade 2 diastolic dysfunction, mild elevation of pulmonary systolic pressure. -Currently all 3 medications are on hold because of profound hypokalemia.   -Blood pressure remains stable.    Morbid obesity  -Body mass index is 43.13 kg/m. Patient has been advised to make an attempt to improve diet and exercise patterns to aid in weight loss.  History of psoriasis  -Uses clobetasol cream.  Not on systemic therapy.    Paroxysmal A. fib  -Currently in sinus rhythm with heart rate between 100 and 110.  -Continue anticoagulation with Eliquis.  Mobility: PT eval. Code Status:   Code Status: Full Code  Nutritional status: Body mass index is 43.13 kg/m.     Diet Order            Diet 2 gram sodium Room service appropriate? Yes; Fluid consistency: Thin  Diet effective now                 DVT prophylaxis:   Antimicrobials:  None Fluid: None Consultants: GI Family Communication:  None at bedside  Status is: Inpatient  Remains inpatient appropriate because-pending lymph node biopsy by IR  Dispo: The patient is from: Home              Anticipated d/c is to: Home most likely              Anticipated d/c date is: Hoping to discharge home after lymph node biopsy              Patient currently is not medically stable to d/c.  Infusions:   sodium chloride      Scheduled Meds:   vitamin C  1,000 mg Oral Daily   fluticasone furoate-vilanterol  1 puff Inhalation Daily   gabapentin  300 mg Oral Daily   insulin aspart  0-15 Units Subcutaneous TID WC   insulin aspart protamine- aspart  40 Units Subcutaneous BID WC   ipratropium-albuterol  3 mL Nebulization Q6H   lisinopril  10 mg Oral BID   multivitamin with minerals  1 tablet Oral Daily   nicotine  7 mg Transdermal Daily   sertraline  100 mg Oral Daily   sodium chloride flush  3 mL Intravenous Q12H    Antimicrobials: Anti-infectives (From admission, onward)   None      PRN meds: sodium chloride, acetaminophen, albuterol, iohexol, ondansetron (ZOFRAN) IV, sodium chloride flush   Objective: Vitals:   03/07/20 2037 03/08/20 0908  BP:    Pulse: (!) 101   Resp: 18   Temp:    SpO2: 97%  96%    Intake/Output Summary (Last 24 hours) at 03/08/2020 1050 Last data filed at 03/08/2020 0306 Gross per 24 hour  Intake --  Output 525 ml  Net -525 ml   Filed Weights   02/22/2020 0747 03/06/20 0619  Weight: 117.9 kg 124.9 kg   Weight change:  Body mass index is 43.13 kg/m.   Physical Exam: General exam: Pleasant, elderly Caucasian female.  Morbidly obese.  Not in physical distress Skin: No rashes, lesions or ulcers. HEENT: Atraumatic, normocephalic, no obvious bleeding Lungs: Diminished air entry in bases.  Coughs on deep breathing CVS: Regular rate and rhythm, no murmur GI/Abd soft, distended obesity, nontender, bowel sound present CNS: Alert, awake, oriented x3.   Psychiatry: Mood appropriate Extremities: Pedal edema improving bilaterally with skin wrinkling, left calf red which patient believes is secondary to psoriasis.  Data Review: I have personally reviewed the laboratory data and studies available.  Recent Labs  Lab 03/15/2020 0800 03/06/20 0522 03/07/20 0240 03/08/20 0041  WBC 12.2* 6.8 7.0 6.3  NEUTROABS 11.1* 5.9 6.0 5.6  HGB 12.8 10.5* 11.0* 10.3*  HCT 42.2 34.1* 34.1* 33.7*   MCV 78.4* 77.9* 77.1* 79.5*  PLT 74* 44* 42* 43*   Recent Labs  Lab 03/12/2020 0800 02/23/2020 1000 03/03/2020 1600 03/05/20 0413 03/06/20 0522 03/07/20 0240 03/08/20 0041  NA 143  --  142 141 142 142 144  K 2.0*  --  2.6* 3.2* 2.5* 3.6 3.3*  CL 97*  --  99 100 99 100 101  CO2 32  --  '30 28 31 30 ' 33*  GLUCOSE 99  --  83 133* 73 129* 144*  BUN 33*  --  35* 43* 44* 42* 38*  CREATININE 1.14*  --  1.01* 1.18* 0.92 1.02* 0.92  CALCIUM 9.0  --  8.6* 8.7* 8.5* 9.1 9.6  MG 2.3  --   --  2.3 2.2  --   --   PHOS  --  2.2*  --  3.7 3.0  --   --     F/u labs ordered.  Signed, Terrilee Croak, MD [facility] 03/08/2020

## 2020-03-08 NOTE — Progress Notes (Signed)
Occupational Therapy Evaluation Patient Details Name: [name] [name] MRN: [id number] DOB: 05-20-1955 Today's Date: 03/08/2020    History of Present Illness 63 y/o F presenting to ED on 12/15 for multiple falls over the week, diarrhea, and B LE pitting edema. Has had increasing weakness over the past few weeks. Found to have hypokalemia. PMHx: asthma, PVD, COPD, DM II, dyspnea, HTN, HLD, tobacco use, obesity, PVD.   Clinical Impression   Pt reporting being mod Indep with ADL's and mobility at home, using O2 only at night as needed, living with Dtr and SIL and using Rollator in the home for mobility and indep managing meds, at this time pt with questionable cognition, Ox2 requiring cues multiple times during session for time. Demo's decreased activity tolerance, strength, safety and insight with inability to achieve transition to standing with +1 level of A despite effort but also reporting to OT that she had been up and ambulating in room to bathroom x2 this date. CNA confirms that is inaccurate. At this time pt presents with significant deficits in indep and safety with ADL's and transfers, will need to confirm PLOF and S/A available at time of d/c with family. Recommendations for post acute OT below.     Follow Up Recommendations  Supervision/Assistance - 24 hour;SNF    Equipment Recommendations  Other (comment) (will continue to assess with additional sessions)    Recommendations for Other Services       Precautions / Restrictions Precautions Precautions: Fall Precaution Comments: watch HR and SpO2 Restrictions Weight Bearing Restrictions: No      Mobility Bed Mobility Overal bed mobility: Needs Assistance Bed Mobility: Supine to Sit     Supine to sit: HOB elevated;Mod assist     General bed mobility comments: pt requesting therapist provide hand for pull to sitting to achieve full transition to sitting EOB.    Transfers Overall transfer level: Needs  assistance Equipment used: Rolling walker (2 wheeled) Transfers: Sit to/from Stand Sit to Stand: From elevated surface         General transfer comment: x3 attempts from EOB with elevated surface unable to achieve full transition to standing despite cues and effort with +1. noted in PT +2 mod A required although pt insiting she had ambulated to bathroom this AM.    Balance Overall balance assessment: Needs assistance Sitting-balance support: Bilateral upper extremity supported;Feet supported Sitting balance-Leahy Scale: Poor Sitting balance - Comments: difficulty with recovery of LOB with dynamic sitting tasks                                   ADL either performed or assessed with clinical judgement   ADL Overall ADL's : Needs assistance/impaired     Grooming: Set up;Bed level   Upper Body Bathing: Minimal assistance;Sitting Upper Body Bathing Details (indicate cue type and reason): EOB Lower Body Bathing: Maximal assistance;Sitting/lateral leans   Upper Body Dressing : Supervision/safety   Lower Body Dressing: Maximal assistance     Toilet Transfer Details (indicate cue type and reason): unable to compelte transfer this date d/t safety concerns. despite effort pt unable to clear buttocks from EOB, Toileting- Clothing Manipulation and Hygiene: Total assistance;Bed level Toileting - Clothing Manipulation Details (indicate cue type and reason): presence of incontinence at bed level       General ADL Comments: demos significantly decreased tolerance for completion of ADL's at this time with noted SOB and increased time for  recovery. at least max A for LB ADL's and up to min A UB, with dep for toileting at this time.     Vision Baseline Vision/History: Wears glasses Wears Glasses: At all times Patient Visual Report: No change from baseline Vision Assessment?: No apparent visual deficits     Perception Perception Perception Tested?: No   Praxis  Praxis Praxis tested?: Not tested    Pertinent Vitals/Pain Faces Pain Scale: Hurts a little bit Pain Location: back     Hand Dominance Right   Extremity/Trunk Assessment Upper Extremity Assessment Upper Extremity Assessment: Overall WFL for tasks assessed   Lower Extremity Assessment Lower Extremity Assessment: Defer to PT evaluation   Cervical / Trunk Assessment Cervical / Trunk Assessment: Normal   Communication Communication Communication: No difficulties   Cognition Arousal/Alertness: Awake/alert Behavior During Therapy: WFL for tasks assessed/performed Overall Cognitive Status: Impaired/Different from baseline Area of Impairment: Orientation;Safety/judgement                 Orientation Level: Disoriented to;Time       Safety/Judgement: Decreased awareness of safety;Decreased awareness of deficits     General Comments: difficulty with focus on task; requiring redirection   General Comments  intermittent bouts of SOB but with good recovery with ~30 seconds of pursed lip breathing    Exercises     Shoulder Instructions      Home Living Family/patient expects to be discharged to:: Private residence Living Arrangements: Children;Other (Comment) Available Help at Discharge: Family;Available PRN/intermittently Type of Home: Apartment Home Access: Stairs to enter Entrance Stairs-Number of Steps: 3 Entrance Stairs-Rails: None Home Layout: One level     Bathroom Shower/Tub: Teacher, early years/pre: Standard Bathroom Accessibility: Yes   Home Equipment: Cane - single point;Walker - 2 wheels;Bedside commode;Shower seat          Prior Functioning/Environment Level of Independence: Independent with assistive device(s)        Comments: Uses AD, endorses being able to do meals at mod indep level with breaks as needed        OT Problem List: Decreased strength;Decreased activity tolerance;Impaired balance (sitting and/or  standing);Decreased cognition;Decreased safety awareness;Decreased knowledge of use of DME or AE;Decreased knowledge of precautions      OT Treatment/Interventions: Self-care/ADL training;Therapeutic exercise;Neuromuscular education;Energy conservation;DME and/or AE instruction;Therapeutic activities;Cognitive remediation/compensation;Patient/family education;Balance training    OT Goals(Current goals can be found in the care plan section) Acute Rehab OT Goals Patient Stated Goal: be home for the holidays OT Goal Formulation: With patient Time For Goal Achievement: 03/22/20 Potential to Achieve Goals: Fair ADL Goals Pt Will Perform Upper Body Bathing: with adaptive equipment;with set-up;sitting Pt Will Perform Lower Body Bathing: with set-up;sitting/lateral leans;sit to/from stand;with supervision;with adaptive equipment Pt Will Perform Upper Body Dressing: with set-up Pt Will Perform Lower Body Dressing: with set-up;with adaptive equipment Pt Will Transfer to Toilet: with supervision;bedside commode;regular height toilet;stand pivot transfer;grab bars Pt Will Perform Toileting - Clothing Manipulation and hygiene: with supervision;sitting/lateral leans;with adaptive equipment Additional ADL Goal #1: Pt will require 0 cues for use of energy conservation techniques during completion of self care tasks  OT Frequency: Min 2X/week   Barriers to D/C:            Co-evaluation              AM-PAC OT "6 Clicks" Daily Activity     Outcome Measure Help from another person eating meals?: None Help from another person taking care of personal grooming?: A Little Help from  another person toileting, which includes using toliet, bedpan, or urinal?: Total Help from another person bathing (including washing, rinsing, drying)?: A Lot Help from another person to put on and taking off regular upper body clothing?: A Little Help from another person to put on and taking off regular lower body  clothing?: A Lot 6 Click Score: 15   End of Session Equipment Utilized During Treatment: Gait belt Nurse Communication: Mobility status (discussed with CNA at end of session)  Activity Tolerance: Patient limited by fatigue Patient left: in bed;with bed alarm set;with call bell/phone within reach  OT Visit Diagnosis: Unsteadiness on feet (R26.81);Muscle weakness (generalized) (M62.81);History of falling (Z91.81);Other symptoms and signs involving the nervous system (R29.898)                Time: 9038-3338 OT Time Calculation (min): 35 min Charges:  OT General Charges $OT Visit: 1 Visit OT Evaluation $OT Eval Low Complexity: 1 Low OT Treatments $Self Care/Home Management : 8-22 mins  Teagon Kron OTR/L acute rehab services Office: [place] 03/08/2020, 1:07 PM

## 2020-03-09 ENCOUNTER — Inpatient Hospital Stay (HOSPITAL_COMMUNITY): Payer: Medicare Other

## 2020-03-09 ENCOUNTER — Other Ambulatory Visit: Payer: Self-pay

## 2020-03-09 LAB — GLUCOSE, CAPILLARY
Glucose-Capillary: 101 mg/dL — ABNORMAL HIGH (ref 70–99)
Glucose-Capillary: 159 mg/dL — ABNORMAL HIGH (ref 70–99)
Glucose-Capillary: 170 mg/dL — ABNORMAL HIGH (ref 70–99)
Glucose-Capillary: 74 mg/dL (ref 70–99)
Glucose-Capillary: 96 mg/dL (ref 70–99)

## 2020-03-09 LAB — HEPATIC FUNCTION PANEL
ALT: 134 U/L — ABNORMAL HIGH (ref 0–44)
AST: 153 U/L — ABNORMAL HIGH (ref 15–41)
Albumin: 2.6 g/dL — ABNORMAL LOW (ref 3.5–5.0)
Alkaline Phosphatase: 212 U/L — ABNORMAL HIGH (ref 38–126)
Bilirubin, Direct: 3 mg/dL — ABNORMAL HIGH (ref 0.0–0.2)
Indirect Bilirubin: 2.2 mg/dL — ABNORMAL HIGH (ref 0.3–0.9)
Total Bilirubin: 5.2 mg/dL — ABNORMAL HIGH (ref 0.3–1.2)
Total Protein: 5 g/dL — ABNORMAL LOW (ref 6.5–8.1)

## 2020-03-09 LAB — BASIC METABOLIC PANEL
Anion gap: 14 (ref 5–15)
BUN: 34 mg/dL — ABNORMAL HIGH (ref 8–23)
CO2: 28 mmol/L (ref 22–32)
Calcium: 9.4 mg/dL (ref 8.9–10.3)
Chloride: 100 mmol/L (ref 98–111)
Creatinine, Ser: 0.82 mg/dL (ref 0.44–1.00)
GFR, Estimated: >60 mL/min (ref >60)
Glucose, Bld: 132 mg/dL — ABNORMAL HIGH (ref 70–99)
Potassium: 4.3 mmol/L (ref 3.5–5.1)
Sodium: 142 mmol/L (ref 135–145)

## 2020-03-09 LAB — CBC
HCT: 33 % — ABNORMAL LOW (ref 36.0–46.0)
Hemoglobin: 10.5 g/dL — ABNORMAL LOW (ref 12.0–15.0)
MCH: 25.1 pg — ABNORMAL LOW (ref 26.0–34.0)
MCHC: 31.8 g/dL (ref 30.0–36.0)
MCV: 78.8 fL — ABNORMAL LOW (ref 80.0–100.0)
Platelets: 40 10*3/uL — ABNORMAL LOW (ref 150–400)
RBC: 4.19 MIL/uL (ref 3.87–5.11)
RDW: 25.6 % — ABNORMAL HIGH (ref 11.5–15.5)
WBC: 7.9 10*3/uL (ref 4.0–10.5)
nRBC: 1.4 % — ABNORMAL HIGH (ref 0.0–0.2)

## 2020-03-09 MED ORDER — MIDAZOLAM HCL 2 MG/2ML IJ SOLN
INTRAMUSCULAR | Status: AC
  Filled 2020-03-09: qty 2

## 2020-03-09 MED ORDER — LIDOCAINE HCL (PF) 1 % IJ SOLN
INTRAMUSCULAR | Status: AC
  Filled 2020-03-09: qty 30

## 2020-03-09 MED ORDER — FENTANYL CITRATE (PF) 100 MCG/2ML IJ SOLN
INTRAMUSCULAR | Status: AC
  Filled 2020-03-09: qty 2

## 2020-03-09 MED ORDER — FENTANYL CITRATE (PF) 100 MCG/2ML IJ SOLN
INTRAMUSCULAR | Status: AC | PRN
  Administered 2020-03-09: 25 ug via INTRAVENOUS

## 2020-03-09 MED ORDER — MIDAZOLAM HCL 2 MG/2ML IJ SOLN
INTRAMUSCULAR | Status: AC | PRN
Start: 2020-03-09 — End: 2020-03-09
  Administered 2020-03-09: 0.5 mg via INTRAVENOUS

## 2020-03-09 NOTE — Procedures (Signed)
Interventional Radiology Procedure Note  Procedure: [place] RT NECK NODE CORE BX    Complications: None  Estimated Blood Loss:  MIN  Findings: 18 G CORES IN SALINE    Tamera Punt, MD
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
## 2020-03-09 NOTE — Progress Notes (Signed)
PROGRESS NOTE    AZHIA SIEFKEN  XBM:[id number] DOB: 06/03/1955 DOA: 03/09/2020 PCP: Jacelyn Pi, MD   Brief Narrative: This 64 yrs old female with PMH significant for morbid obesity, DM2, HTN, HLD, HFpEF, paroxysmal A. fib on Eliquis, PAD s/p right fem-pop bypass, carotid artery stenosis, COPD, chronic hilar lymphadenopathy presented in the emergency department with progressive generalized weakness, shortness of breath and worsening leg swelling. Patient was recently hospitalized in August 2021 for worsening of right lower extremity ischemia, underwent right-sided femoropopliteal bypass.  Since the surgery, patient has progressive bilateral leg swelling, right more than left.  She was started on Lasix 3 weeks ago by her PCP.  Leg swelling gradually improved but she has gotten progressively weaker, overall she feels very tired and has not been able to walk as much as before. She also has several days of diarrhea. Patient was admitted for severe hypokalemia with potassium of 2.0, elevated liver enzymes, low platelets.  CT abdomen showed significant increase in right hilar and subcarinal lymphadenopathy when compared with recent CT scan of the chest.  IR consulted for supraclavicular lymph node biopsy tentatively scheduled for 12/20.   Assessment & Plan:   Active Problems:   Hypokalemia   Severe hypokalemia / Hypophosphatemia : >> Improved -She was found to have serum potassium of 2.0. and phosphorus 2.2 on arrival -Secondary to use of Lasix as well as ongoing diarrhea -Potassium level improved with replacement.   -K+ Level 4.23 today.  Diarrhea -Improved.  Elevated liver enzymes: -Likely related to fatty liver disease and previous use of alcohol. -Right upper quadrant ultrasound showed cirrhotic appearing liver without mass and an small amount of ascites. -Acute hepatitis panel negative. -LFTs trend as below.  AST and ALT improving but alk phos remains elevated -Keep statin on  hold. -[place] consistent with new diagnosis of cirrhosis. -Continue current management.  Progressive hilar lymphadenopathy Right supraclavicular lymphadenopathy -CT abdomen showed significant increase in the right hilar and subcarinal lymphadenopathy. -CT chest showed mediastinal and right hilar adenopathy causing circumferential narrowing of the bronchus intermedius, right lower and middle lobe bronchi suspicious for malignancy. -PCCM consult appreciated.  Patient also has right supraclavicular lymphadenopathy.  IR consulted.  LN biopsy tentatively scheduled for today.   Diabetes mellitus type 2 -On 60 units twice daily of insulin 70/30 at home.  Unclear compliance. -Continue insulin 70/30 twice a day 40 units..  Blood sugar remains consistently under 200.   -Continue continue to monitor with sliding scale insulin and Accu-Cheks.   PAD s/p b/l fem-pop bypass, carotid artery stenosis -Home meds includes Eliquis 5 mg twice daily, aspirin 81 mg daily and statin. -Keep Eliquis on hold today because of worsening thrombocytopenia.  Statin remains on hold. -Also continue Neurontin 300 mg 3 times daily.  Thrombocytopenia Continue to monitor platelet levels.  There is no signs of bleeding. Hold Eliquis for now.  COPD -Resume bronchodilators. - Not in any acute exacerbation.  Chronic diastolic CHF decompensation -It seems that patient was taking HCTZ 25 mg daily and lisinopril 10 mg twice daily at home.  3 weeks prior to admission, Lasix 40 mg daily was also added because of pedal edema. -At this time, patient has improving pedal edema.  She is not in acute CHF exacerbation. -Echo from August 2021 showed EF of 02%, grade 2 diastolic dysfunction, mild elevation of pulmonary systolic pressure. -Currently all 3 medications are on hold because of profound hypokalemia.   -Blood pressure remains stable. We will resume BP medications  Morbid obesity  -Body mass index is 43.13 kg/m. Patient  has been advised to make an attempt to improve diet and exercise patterns to aid in weight loss.  History of psoriasis  -Uses clobetasol cream.  Not on systemic therapy.    Paroxysmal A. fib  -Currently in sinus rhythm with heart rate between 100 and 110.  -Continue anticoagulation with Eliquis, currently on hold due to thrombocytopenia.   DVT prophylaxis: SCDS. Code Status: Full code Family Communication: No family at bedside. Disposition Plan:   Status is: Inpatient  Remains inpatient appropriate because:Inpatient level of care appropriate due to severity of illness   Dispo: The patient is from: Home              Anticipated d/c is to: Home              Anticipated d/c date is: 3 days              Patient currently is not medically stable to d/c.   Consultants:   Gastroenterology, interventional radiology.  Procedures:  None Antimicrobials:  Anti-infectives (From admission, onward)   None      Subjective: Patient was seen and examined at bedside.  Overnight events noted.  She denies any shortness of breath.  She is alert and oriented x2.  She reports having significant amount of back pain, found to have pitting edema.  Objective: Vitals:   03/08/20 1921 03/08/20 2120 03/09/20 0419 03/09/20 0833  BP:  (!) 141/70 (!) 151/58   Pulse: (!) 103 (!) 110 (!) 101   Resp: '19 16 18   ' Temp:  98.2 F (36.8 C) 97.7 F (36.5 C)   TempSrc:  Oral Oral   SpO2: 93% 98% 90% 94%  Weight:      Height:        Intake/Output Summary (Last 24 hours) at 03/09/2020 1132 Last data filed at 03/09/2020 0900 Gross per 24 hour  Intake 780 ml  Output 1000 ml  Net -220 ml   Filed Weights   03/10/2020 0747 03/06/20 0619  Weight: 117.9 kg 124.9 kg    Examination:  General exam: Appears calm and comfortable , in any acute distress. Respiratory system: Wheezing on auscultation. Respiratory effort normal. Cardiovascular system: S1 & S2 heard, RRR. No JVD, murmurs, rubs, gallops or  clicks. No pedal edema. Gastrointestinal system: Abdomen is nondistended, soft and nontender. No organomegaly or masses felt.  Normal bowel sounds heard. Central nervous system: Alert and oriented. No focal neurological deficits. Extremities: No clubbing, no cyanosis, 2+ pitting edema noted. Skin: No rashes, lesions or ulcers Psychiatry: Judgement and insight appear normal. Mood & affect appropriate.     Data Reviewed: I have personally reviewed following labs and imaging studies  CBC: Recent Labs  Lab 03/03/2020 0800 03/06/20 0522 03/07/20 0240 03/08/20 0041  WBC 12.2* 6.8 7.0 6.3  NEUTROABS 11.1* 5.9 6.0 5.6  HGB 12.8 10.5* 11.0* 10.3*  HCT 42.2 34.1* 34.1* 33.7*  MCV 78.4* 77.9* 77.1* 79.5*  PLT 74* 44* 42* 43*   Basic Metabolic Panel: Recent Labs  Lab 02/22/2020 0800 03/14/2020 1000 02/28/2020 1600 03/05/20 0413 03/06/20 0522 03/07/20 0240 03/08/20 0041 03/09/20 0942  NA 143  --    < > 141 142 142 144 142  K 2.0*  --    < > 3.2* 2.5* 3.6 3.3* 4.3  CL 97*  --    < > 100 99 100 101 100  CO2 32  --    < >  '28 31 30 ' 33* 28  GLUCOSE 99  --    < > 133* 73 129* 144* 132*  BUN 33*  --    < > 43* 44* 42* 38* 34*  CREATININE 1.14*  --    < > 1.18* 0.92 1.02* 0.92 0.82  CALCIUM 9.0  --    < > 8.7* 8.5* 9.1 9.6 9.4  MG 2.3  --   --  2.3 2.2  --   --   --   PHOS  --  2.2*  --  3.7 3.0  --   --   --    < > = values in this interval not displayed.   GFR: Estimated Creatinine Clearance: 92.6 mL/min (by C-G formula based on SCr of 0.82 mg/dL). Liver Function Tests: Recent Labs  Lab 03/05/20 0413 03/06/20 0522 03/07/20 0240 03/08/20 0041 03/09/20 0128  AST 322* 269* 227* 166* 153*  ALT 215* 196* 179* 147* 134*  ALKPHOS 186* 183* 205* 186* 212*  BILITOT 4.3* 3.5* 4.8* 4.5* 5.2*  PROT 4.6* 4.7* 5.0* 5.2* 5.0*  ALBUMIN 2.4* 2.5* 2.9* 2.9* 2.6*   Recent Labs  Lab 03/16/2020 0800  LIPASE 41   No results for input(s): AMMONIA in the last 168 hours. Coagulation  Profile: Recent Labs  Lab 03/05/20 0413  INR 1.8*   Cardiac Enzymes: Recent Labs  Lab 03/03/2020 1728  CKTOTAL 123   BNP (last 3 results) No results for input(s): PROBNP in the last 8760 hours. HbA1C: No results for input(s): HGBA1C in the last 72 hours. CBG: Recent Labs  Lab 03/08/20 1222 03/08/20 1653 03/08/20 2207 03/09/20 0015 03/09/20 0741  GLUCAP 103* 161* 64* 74 96   Lipid Profile: No results for input(s): CHOL, HDL, LDLCALC, TRIG, CHOLHDL, LDLDIRECT in the last 72 hours. Thyroid Function Tests: No results for input(s): TSH, T4TOTAL, FREET4, T3FREE, THYROIDAB in the last 72 hours. Anemia Panel: No results for input(s): VITAMINB12, FOLATE, FERRITIN, TIBC, IRON, RETICCTPCT in the last 72 hours. Sepsis Labs: No results for input(s): PROCALCITON, LATICACIDVEN in the last 168 hours.  Recent Results (from the past 240 hour(s))  Resp Panel by RT-PCR (Flu A&B, Covid) Nasopharyngeal Swab     Status: None   Collection Time: 03/12/2020  8:27 AM   Specimen: Nasopharyngeal Swab; Nasopharyngeal(NP) swabs in vial transport medium  Result Value Ref Range Status   SARS Coronavirus 2 by RT PCR NEGATIVE NEGATIVE Final    Comment: (NOTE) SARS-CoV-2 target nucleic acids are NOT DETECTED.  The SARS-CoV-2 RNA is generally detectable in upper respiratory specimens during the acute phase of infection. The lowest concentration of SARS-CoV-2 viral copies this assay can detect is 138 copies/mL. A negative result does not preclude SARS-Cov-2 infection and should not be used as the sole basis for treatment or other patient management decisions. A negative result may occur with  improper specimen collection/handling, submission of specimen other than nasopharyngeal swab, presence of viral mutation(s) within the areas targeted by this assay, and inadequate number of viral copies(<138 copies/mL). A negative result must be combined with clinical observations, patient history, and  epidemiological information. The expected result is Negative.  Fact Sheet for Patients:  EntrepreneurPulse.com.au  Fact Sheet for Healthcare Providers:  IncredibleEmployment.be  This test is no t yet approved or cleared by the [place] FDA and  has been authorized for detection and/or diagnosis of SARS-CoV-2 by FDA under an Emergency Use Authorization (EUA). This EUA will remain  in effect (meaning this test can be used) for  the duration of the COVID-19 declaration under Section 564(b)(1) of the Act, 21 U.S.C.section 360bbb-3(b)(1), unless the authorization is terminated  or revoked sooner.       Influenza A by PCR NEGATIVE NEGATIVE Final   Influenza B by PCR NEGATIVE NEGATIVE Final    Comment: (NOTE) The Xpert Xpress SARS-CoV-2/FLU/RSV plus assay is intended as an aid in the diagnosis of influenza from Nasopharyngeal swab specimens and should not be used as a sole basis for treatment. Nasal washings and aspirates are unacceptable for Xpert Xpress SARS-CoV-2/FLU/RSV testing.  Fact Sheet for Patients: EntrepreneurPulse.com.au  Fact Sheet for Healthcare Providers: IncredibleEmployment.be  This test is not yet approved or cleared by the [place] FDA and has been authorized for detection and/or diagnosis of SARS-CoV-2 by FDA under an Emergency Use Authorization (EUA). This EUA will remain in effect (meaning this test can be used) for the duration of the COVID-19 declaration under Section 564(b)(1) of the Act, 21 U.S.C. section 360bbb-3(b)(1), unless the authorization is terminated or revoked.  Performed at [facility], [place] [address] [place], [place] [address]      Radiology Studies: No results found.  Scheduled Meds:  vitamin C  1,000 mg Oral Daily   fluticasone furoate-vilanterol  1 puff Inhalation Daily   gabapentin  300 mg Oral Daily   insulin aspart  0-15 Units  Subcutaneous TID WC   insulin aspart protamine- aspart  40 Units Subcutaneous BID WC   ipratropium-albuterol  3 mL Nebulization Q6H   lisinopril  10 mg Oral BID   multivitamin with minerals  1 tablet Oral Daily   nicotine  7 mg Transdermal Daily   sertraline  100 mg Oral Daily   sodium chloride flush  3 mL Intravenous Q12H   Continuous Infusions:  sodium chloride       LOS: 5 days    Time spent: 35 mins    Raiyan Dalesandro, MD [facility]   If 7PM-7AM, please contact night-coverage

## 2020-03-09 NOTE — Progress Notes (Signed)
Inpatient Diabetes Program Recommendations  AACE/ADA: New Consensus Statement on Inpatient Glycemic Control (2015)  Target Ranges:  Prepandial:   less than 140 mg/dL      Peak postprandial:   less than 180 mg/dL (1-2 hours)      Critically ill patients:  140 - 180 mg/dL   Lab Results  Component Value Date   GLUCAP 170 (H) 03/09/2020   HGBA1C 6.5 (H) 02/20/2020    Review of Glycemic Control Results for [name] [name], [name] [name]" (MRN [id number]) as of 03/09/2020 12:12  Ref. Range 03/08/2020 22:07 03/09/2020 00:15 03/09/2020 07:41 03/09/2020 11:56  Glucose-Capillary Latest Ref Range: 70 - 99 mg/dL 64 (L) 74 96 170 (H)   Diabetes history: DM 2 Outpatient Diabetes medications:  Novolin 70/30 60 units bid Current orders for Inpatient glycemic control:  Novolog 0-15 units tid with meals Novolog 70/30 mix 40 units bid Inpatient Diabetes Program Recommendations:   Please consider reducing Novolog 70/30 mix to 32 units bid.  70/30 held this morning due to patient being NPO.   Thanks,  Adah Perl, RN, BC-ADM Inpatient Diabetes Coordinator Pager [phone] (8a-5p)

## 2020-03-10 ENCOUNTER — Inpatient Hospital Stay (HOSPITAL_COMMUNITY): Payer: Medicare Other

## 2020-03-10 DIAGNOSIS — J9601 Acute respiratory failure with hypoxia: Secondary | ICD-10-CM

## 2020-03-10 DIAGNOSIS — R591 Generalized enlarged lymph nodes: Secondary | ICD-10-CM

## 2020-03-10 DIAGNOSIS — E872 Acidosis: Secondary | ICD-10-CM

## 2020-03-10 DIAGNOSIS — R410 Disorientation, unspecified: Secondary | ICD-10-CM

## 2020-03-10 LAB — POCT I-STAT 7, (LYTES, BLD GAS, ICA,H+H)
Acid-Base Excess: 0 mmol/L (ref 0.0–2.0)
Bicarbonate: 24.5 mmol/L (ref 20.0–28.0)
Calcium, Ion: 1.29 mmol/L (ref 1.15–1.40)
HCT: 33 % — ABNORMAL LOW (ref 36.0–46.0)
Hemoglobin: 11.2 g/dL — ABNORMAL LOW (ref 12.0–15.0)
O2 Saturation: 88 %
Patient temperature: 99
Potassium: 4.6 mmol/L (ref 3.5–5.1)
Sodium: 139 mmol/L (ref 135–145)
TCO2: 26 mmol/L (ref 22–32)
pCO2 arterial: 40.1 mmHg (ref 32.0–48.0)
pH, Arterial: 7.396 (ref 7.350–7.450)
pO2, Arterial: 56 mmHg — ABNORMAL LOW (ref 83.0–108.0)

## 2020-03-10 LAB — CBC
HCT: 33.3 % — ABNORMAL LOW (ref 36.0–46.0)
HCT: 37.2 % (ref 36.0–46.0)
Hemoglobin: 10.6 g/dL — ABNORMAL LOW (ref 12.0–15.0)
Hemoglobin: 11.1 g/dL — ABNORMAL LOW (ref 12.0–15.0)
MCH: 24.9 pg — ABNORMAL LOW (ref 26.0–34.0)
MCH: 24.3 pg — ABNORMAL LOW (ref 26.0–34.0)
MCHC: 31.8 g/dL (ref 30.0–36.0)
MCHC: 29.8 g/dL — ABNORMAL LOW (ref 30.0–36.0)
MCV: 78.4 fL — ABNORMAL LOW (ref 80.0–100.0)
MCV: 81.4 fL (ref 80.0–100.0)
Platelets: 40 10*3/uL — ABNORMAL LOW (ref 150–400)
Platelets: 49 10*3/uL — ABNORMAL LOW (ref 150–400)
RBC: 4.25 MIL/uL (ref 3.87–5.11)
RBC: 4.57 MIL/uL (ref 3.87–5.11)
RDW: 25.6 % — ABNORMAL HIGH (ref 11.5–15.5)
RDW: 26.5 % — ABNORMAL HIGH (ref 11.5–15.5)
WBC: 7.3 10*3/uL (ref 4.0–10.5)
WBC: 5.7 10*3/uL (ref 4.0–10.5)
nRBC: 2.2 % — ABNORMAL HIGH (ref 0.0–0.2)
nRBC: 22.1 % — ABNORMAL HIGH (ref 0.0–0.2)

## 2020-03-10 LAB — BLOOD GAS, ARTERIAL
Acid-Base Excess: 1.4 mmol/L (ref 0.0–2.0)
Acid-Base Excess: 2.7 mmol/L — ABNORMAL HIGH (ref 0.0–2.0)
Bicarbonate: 24.8 mmol/L (ref 20.0–28.0)
Bicarbonate: 26.1 mmol/L (ref 20.0–28.0)
Drawn by: 331761
FIO2: 36
O2 Saturation: 90 %
O2 Saturation: 86.8 %
Patient temperature: 36.7
Patient temperature: 36.8
pCO2 arterial: 34.1 mmHg (ref 32.0–48.0)
pCO2 arterial: 35.7 mmHg (ref 32.0–48.0)
pH, Arterial: 7.473 — ABNORMAL HIGH (ref 7.350–7.450)
pH, Arterial: 7.476 — ABNORMAL HIGH (ref 7.350–7.450)
pO2, Arterial: 57.3 mmHg — ABNORMAL LOW (ref 83.0–108.0)
pO2, Arterial: 52.3 mmHg — ABNORMAL LOW (ref 83.0–108.0)

## 2020-03-10 LAB — COMPREHENSIVE METABOLIC PANEL
ALT: 121 U/L — ABNORMAL HIGH (ref 0–44)
ALT: 130 U/L — ABNORMAL HIGH (ref 0–44)
AST: 131 U/L — ABNORMAL HIGH (ref 15–41)
AST: 185 U/L — ABNORMAL HIGH (ref 15–41)
Albumin: 2.3 g/dL — ABNORMAL LOW (ref 3.5–5.0)
Albumin: 1.9 g/dL — ABNORMAL LOW (ref 3.5–5.0)
Alkaline Phosphatase: 197 U/L — ABNORMAL HIGH (ref 38–126)
Alkaline Phosphatase: 178 U/L — ABNORMAL HIGH (ref 38–126)
Anion gap: 10 (ref 5–15)
Anion gap: 17 — ABNORMAL HIGH (ref 5–15)
BUN: 39 mg/dL — ABNORMAL HIGH (ref 8–23)
BUN: 47 mg/dL — ABNORMAL HIGH (ref 8–23)
CO2: 31 mmol/L (ref 22–32)
CO2: 23 mmol/L (ref 22–32)
Calcium: 9.5 mg/dL (ref 8.9–10.3)
Calcium: 9.3 mg/dL (ref 8.9–10.3)
Chloride: 101 mmol/L (ref 98–111)
Chloride: 100 mmol/L (ref 98–111)
Creatinine, Ser: 0.83 mg/dL (ref 0.44–1.00)
Creatinine, Ser: 1.37 mg/dL — ABNORMAL HIGH (ref 0.44–1.00)
GFR, Estimated: >60 mL/min (ref >60)
GFR, Estimated: 43 mL/min — ABNORMAL LOW (ref >60)
Glucose, Bld: 107 mg/dL — ABNORMAL HIGH (ref 70–99)
Glucose, Bld: 90 mg/dL (ref 70–99)
Potassium: 4.3 mmol/L (ref 3.5–5.1)
Potassium: 4.7 mmol/L (ref 3.5–5.1)
Sodium: 142 mmol/L (ref 135–145)
Sodium: 140 mmol/L (ref 135–145)
Total Bilirubin: 5.8 mg/dL — ABNORMAL HIGH (ref 0.3–1.2)
Total Bilirubin: 5.6 mg/dL — ABNORMAL HIGH (ref 0.3–1.2)
Total Protein: 4.8 g/dL — ABNORMAL LOW (ref 6.5–8.1)
Total Protein: 4.3 g/dL — ABNORMAL LOW (ref 6.5–8.1)

## 2020-03-10 LAB — GLUCOSE, CAPILLARY
Glucose-Capillary: 111 mg/dL — ABNORMAL HIGH (ref 70–99)
Glucose-Capillary: 63 mg/dL — ABNORMAL LOW (ref 70–99)
Glucose-Capillary: 81 mg/dL (ref 70–99)
Glucose-Capillary: 57 mg/dL — ABNORMAL LOW (ref 70–99)
Glucose-Capillary: 72 mg/dL (ref 70–99)
Glucose-Capillary: 40 mg/dL — CL (ref 70–99)
Glucose-Capillary: 86 mg/dL (ref 70–99)
Glucose-Capillary: 46 mg/dL — ABNORMAL LOW (ref 70–99)
Glucose-Capillary: 56 mg/dL — ABNORMAL LOW (ref 70–99)
Glucose-Capillary: 67 mg/dL — ABNORMAL LOW (ref 70–99)
Glucose-Capillary: 69 mg/dL — ABNORMAL LOW (ref 70–99)
Glucose-Capillary: 82 mg/dL (ref 70–99)
Glucose-Capillary: 44 mg/dL — CL (ref 70–99)

## 2020-03-10 LAB — URINALYSIS, ROUTINE W REFLEX MICROSCOPIC
Glucose, UA: NEGATIVE mg/dL
Hgb urine dipstick: NEGATIVE
Ketones, ur: NEGATIVE mg/dL
Leukocytes,Ua: NEGATIVE
Nitrite: NEGATIVE
Protein, ur: NEGATIVE mg/dL
Specific Gravity, Urine: 1.018 (ref 1.005–1.030)
pH: 5 (ref 5.0–8.0)

## 2020-03-10 LAB — AMMONIA: Ammonia: 12 umol/L (ref 9–35)

## 2020-03-10 LAB — PROCALCITONIN: Procalcitonin: 2.52 ng/mL

## 2020-03-10 LAB — PHOSPHORUS: Phosphorus: 2.8 mg/dL (ref 2.5–4.6)

## 2020-03-10 LAB — MAGNESIUM: Magnesium: 2.5 mg/dL — ABNORMAL HIGH (ref 1.7–2.4)

## 2020-03-10 LAB — LACTIC ACID, PLASMA
Lactic Acid, Venous: 7.5 mmol/L (ref 0.5–1.9)
Lactic Acid, Venous: 8.1 mmol/L (ref 0.5–1.9)

## 2020-03-10 LAB — LIPASE, BLOOD: Lipase: 23 U/L (ref 11–51)

## 2020-03-10 LAB — PROTIME-INR
INR: 2.3 — ABNORMAL HIGH (ref 0.8–1.2)
Prothrombin Time: 24.7 seconds — ABNORMAL HIGH (ref 11.4–15.2)

## 2020-03-10 MED ORDER — CHLORHEXIDINE GLUCONATE CLOTH 2 % EX PADS
6.0000 | MEDICATED_PAD | Freq: Every day | CUTANEOUS | Status: DC
  Administered 2020-03-10: 6 via TOPICAL

## 2020-03-10 MED ORDER — FUROSEMIDE 40 MG PO TABS
40.0000 mg | ORAL_TABLET | Freq: Every day | ORAL | Status: DC
  Administered 2020-03-10: 40 mg via ORAL
  Filled 2020-03-10: qty 1

## 2020-03-10 MED ORDER — FENTANYL CITRATE (PF) 100 MCG/2ML IJ SOLN: 25.0000 ug | INTRAMUSCULAR | Status: DC | PRN

## 2020-03-10 MED ORDER — FUROSEMIDE 10 MG/ML IJ SOLN
40.0000 mg | Freq: Once | INTRAMUSCULAR | Status: AC
  Administered 2020-03-10: 40 mg via INTRAVENOUS
  Filled 2020-03-10: qty 4

## 2020-03-10 MED ORDER — METOPROLOL TARTRATE 12.5 MG HALF TABLET
12.5000 mg | ORAL_TABLET | Freq: Two times a day (BID) | ORAL | Status: DC
  Administered 2020-03-10: 12.5 mg via ORAL
  Filled 2020-03-10: qty 1

## 2020-03-10 MED ORDER — ETOMIDATE 2 MG/ML IV SOLN
INTRAVENOUS | Status: AC
  Filled 2020-03-10: qty 20

## 2020-03-10 MED ORDER — DEXTROSE 50 % IV SOLN
12.5000 g | INTRAVENOUS | Status: AC
  Administered 2020-03-10: 12.5 g via INTRAVENOUS

## 2020-03-10 MED ORDER — PROPOFOL 1000 MG/100ML IV EMUL
INTRAVENOUS | Status: AC
  Filled 2020-03-10: qty 100

## 2020-03-10 MED ORDER — LACTATED RINGERS IV BOLUS
1000.0000 mL | Freq: Once | INTRAVENOUS | Status: AC
  Administered 2020-03-10: 1000 mL via INTRAVENOUS

## 2020-03-10 MED ORDER — DEXTROSE IN LACTATED RINGERS 5 % IV SOLN: INTRAVENOUS | Status: DC

## 2020-03-10 MED ORDER — DEXTROSE 50 % IV SOLN
INTRAVENOUS | Status: AC
  Filled 2020-03-10: qty 50

## 2020-03-10 MED ORDER — ORAL CARE MOUTH RINSE
15.0000 mL | OROMUCOSAL | Status: DC
  Administered 2020-03-11 (×3): 15 mL via OROMUCOSAL

## 2020-03-10 MED ORDER — SODIUM CHLORIDE 0.9 % IV SOLN: 2.0000 g | Freq: Two times a day (BID) | INTRAVENOUS | Status: DC

## 2020-03-10 MED ORDER — DEXTROSE 10 % IV SOLN: INTRAVENOUS | Status: DC

## 2020-03-10 MED ORDER — MIDAZOLAM HCL 2 MG/2ML IJ SOLN
INTRAMUSCULAR | Status: AC
  Filled 2020-03-10: qty 4

## 2020-03-10 MED ORDER — INSULIN ASPART PROT & ASPART (70-30 MIX) 100 UNIT/ML ~~LOC~~ SUSP: 25.0000 [IU] | Freq: Two times a day (BID) | SUBCUTANEOUS | Status: DC

## 2020-03-10 MED ORDER — POLYETHYLENE GLYCOL 3350 17 G PO PACK: 17.0000 g | PACK | Freq: Every day | ORAL | Status: DC

## 2020-03-10 MED ORDER — CHLORHEXIDINE GLUCONATE 0.12% ORAL RINSE (MEDLINE KIT)
15.0000 mL | Freq: Two times a day (BID) | OROMUCOSAL | Status: DC
  Administered 2020-03-11: 15 mL via OROMUCOSAL

## 2020-03-10 MED ORDER — PROPOFOL 1000 MG/100ML IV EMUL: 0.0000 ug/kg/min | INTRAVENOUS | Status: DC

## 2020-03-10 MED ORDER — SODIUM CHLORIDE 0.9% FLUSH
10.0000 mL | Freq: Two times a day (BID) | INTRAVENOUS | Status: DC
  Administered 2020-03-10: 10 mL

## 2020-03-10 MED ORDER — FENTANYL CITRATE (PF) 100 MCG/2ML IJ SOLN
INTRAMUSCULAR | Status: AC
  Filled 2020-03-10: qty 2

## 2020-03-10 MED ORDER — ROCURONIUM BROMIDE 10 MG/ML (PF) SYRINGE
PREFILLED_SYRINGE | INTRAVENOUS | Status: AC
  Filled 2020-03-10: qty 10

## 2020-03-10 MED ORDER — SODIUM CHLORIDE 0.9 % IV SOLN: 2.0000 g | Freq: Three times a day (TID) | INTRAVENOUS | Status: DC

## 2020-03-10 MED ORDER — DEXTROSE 50 % IV SOLN
INTRAVENOUS | Status: AC
  Administered 2020-03-10: 50 mL
  Filled 2020-03-10: qty 50

## 2020-03-10 MED ORDER — SODIUM CHLORIDE 0.9% FLUSH: 10.0000 mL | INTRAVENOUS | Status: DC | PRN

## 2020-03-10 MED ORDER — DOCUSATE SODIUM 50 MG/5ML PO LIQD: 100.0000 mg | Freq: Two times a day (BID) | ORAL | Status: DC

## 2020-03-10 MED ORDER — FENTANYL CITRATE (PF) 100 MCG/2ML IJ SOLN
25.0000 ug | INTRAMUSCULAR | Status: DC | PRN
  Filled 2020-03-10: qty 2

## 2020-03-10 MED ORDER — METOPROLOL TARTRATE 5 MG/5ML IV SOLN: 2.5000 mg | INTRAVENOUS | Status: DC | PRN

## 2020-03-10 NOTE — Progress Notes (Signed)
Physical Therapy Treatment Patient Details Name: [name] [name] MRN: [id number] DOB: [birth year] Today's Date: 03/10/2020    History of Present Illness 64 y/o F presenting to ED on 12/15 for multiple falls, increased weakness, diarrhea, and Bil LE pitting edema.Found to have hypokalemia. s/p supraclavicular lymph node biopsy 12/20. PMHx: asthma, PVD, COPD, DM II, dyspnea, HTN, HLD, tobacco use, obesity, PVD.    PT Comments    Pt with significant change in cognitive and physical function this session compared to eval. Pt unable to maintain attention, disoriented to place/time/situation with extreme fatigue with only rolling in bed. Resting HR 114-122 with bed level and SpO2 difficult to obtain with sats 91% on 4L when reading with noted rattling with coughing. Pt remains appropriate for SNF as unable to significantly mobilize at this time. RN and MD made aware of pt change in status.     Follow Up Recommendations  SNF;Supervision/Assistance - 24 hour     Equipment Recommendations  Rolling walker with 5" wheels;3in1 (PT);Hospital bed    Recommendations for Other Services       Precautions / Restrictions Precautions Precautions: Fall Precaution Comments: watch HR and SpO2, wounds on left foot    Mobility  Bed Mobility Overal bed mobility: Needs Assistance Bed Mobility: Rolling Rolling: Mod assist         General bed mobility comments: mod assist with multimodal cues to roll bil with rail and increased time for pericare. Min assist to slide toward [facility] with pt pulling up on head rail  Transfers                 General transfer comment: unable to attempt this session pt with significant fatigue with bed mobility  Ambulation/Gait                 Stairs             Wheelchair Mobility    Modified Rankin (Stroke Patients Only)       Balance                                            Cognition Arousal/Alertness:  Lethargic Behavior During Therapy: Flat affect Overall Cognitive Status: Impaired/Different from baseline Area of Impairment: Orientation;Safety/judgement                 Orientation Level: Disoriented to;Time;Situation;Place Current Attention Level: Focused     Safety/Judgement: Decreased awareness of safety;Decreased awareness of deficits   Problem Solving: Slow processing;Requires verbal cues;Requires tactile cues General Comments: pt with significant cognitive decline this session disoriented to day despite repeated education throughout session. Pt with decreased attention, awareness and unaware of incontinent bowel and bladder on arrival      Exercises General Exercises - Lower Extremity Heel Slides: AROM;Both;5 reps;Supine    General Comments        Pertinent Vitals/Pain Pain Score: 4  Pain Location: back Pain Descriptors / Indicators: Aching Pain Intervention(s): Limited activity within patient's tolerance;Monitored during session;Repositioned    Home Living                      Prior Function            PT Goals (current goals can now be found in the care plan section) Progress towards PT goals: Not progressing toward goals - comment    Frequency  Min 2X/week      PT Plan Current plan remains appropriate    Co-evaluation              AM-PAC PT "6 Clicks" Mobility   Outcome Measure  Help needed turning from your back to your side while in a flat bed without using bedrails?: A Lot Help needed moving from lying on your back to sitting on the side of a flat bed without using bedrails?: A Lot Help needed moving to and from a bed to a chair (including a wheelchair)?: Total Help needed standing up from a chair using your arms (e.g., wheelchair or bedside chair)?: Total Help needed to walk in hospital room?: Total Help needed climbing 3-5 steps with a railing? : Total 6 Click Score: 8    End of Session   Activity Tolerance:  Treatment limited secondary to medical complications (Comment) Patient left: in bed;with call bell/phone within reach;with bed alarm set Nurse Communication: Mobility status;Other (comment) (decline in cognition and physical function) PT Visit Diagnosis: Repeated falls (R29.6);Unsteadiness on feet (R26.81);Difficulty in walking, not elsewhere classified (R26.2);Pain     Time: 0940-1004 PT Time Calculation (min) (ACUTE ONLY): 24 min  Charges:  $Therapeutic Activity: 23-37 mins                     Hayes Rehfeldt P, PT Acute Rehabilitation Services Pager: [phone] Office: [place] 03/10/2020, 10:15 AM

## 2020-03-10 NOTE — Progress Notes (Addendum)
Pharmacy Antibiotic Note  [name] [name] is a 64 y.o. female admitted on 03/14/2020 with hypokalemia and abdominal pain. Pt now with acute hypoxic respiratory failure, possible aspiration. Pharmacy has been consulted for cefepime dosing for aspiration pneumonia (CCM provider will evaluate where additional antibiotic therapy is indicated for this pt).  WBC 7.3, afebrile; Scr 0.83, CrCl 91.5 ml/min (renal function stable)  Plan: Cefepime 2 gm IV Q 8 hrs Monitor WBC, temp, clinical improvement, renal function  ADDENDUM: New serum creatinine of 1.37 - will decrease cefepime to 2 gms IV q12hr  Height: 5\' 7"  (170.2 cm) Weight: 124.9 kg (275 lb 5.7 oz) IBW/kg (Calculated) : 61.6  Temp (24hrs), Avg:98 F (36.7 C), Min:97.9 F (36.6 C), Max:98.2 F (36.8 C)  Recent Labs  Lab 03/06/20 0522 03/07/20 0240 03/08/20 0041 03/09/20 0942 03/10/20 0110 03/10/20 1631  WBC 6.8 7.0 6.3 7.9 7.3  --   CREATININE 0.92 1.02* 0.92 0.82 0.83  --   LATICACIDVEN  --   --   --   --   --  7.5*    Estimated Creatinine Clearance: 91.5 mL/min (by C-G formula based on SCr of 0.83 mg/dL).    Allergies  Allergen Reactions  . Iohexol Rash    Pt states she had a rash 35-40 yrs ago during a procedure.  Benadryl was given and pt was fine in 30 mins. She has had multiple CT's since with premeds and has done fine.  No anaphylaxis per pt. I updated this record. [name], RTRCT  03/06/17  . Penicillins Rash and Other (See Comments)    PATIENT HAS HAD A PCN REACTION WITH IMMEDIATE RASH, FACIAL/TONGUE/THROAT SWELLING, SOB, OR LIGHTHEADEDNESS WITH HYPOTENSION:  #  #  YES  #  #  Has patient had a PCN reaction causing severe rash involving mucus membranes or skin necrosis: No Has patient had a PCN reaction that required hospitalization: No Has patient had a PCN reaction occurring within the last 10 years: No Can take amoxicillin     . Levaquin [Levofloxacin] Hives and Rash  . Morphine And Related Nausea And  Vomiting    Makes her crawl out of her skin  . Sulfa Antibiotics Rash    Antimicrobials this admission: 12/21 Cefepime >>  Microbiology results: 12/15 COVID, flu A, flu B, HIV screen, hepatitis A/B/C serologies: negative  Thank you for allowing pharmacy to be a part of this patient's care.  Gillermina Hu, PharmD, BCPS, [facility] Clinical Pharmacist 03/10/2020 5:36 PM

## 2020-03-10 NOTE — Progress Notes (Signed)
Inpatient Diabetes Program Recommendations  AACE/ADA: New Consensus Statement on Inpatient Glycemic Control (2015)  Target Ranges:  Prepandial:   less than 140 mg/dL      Peak postprandial:   less than 180 mg/dL (1-2 hours)      Critically ill patients:  140 - 180 mg/dL   Lab Results  Component Value Date   GLUCAP 63 (L) 03/10/2020   HGBA1C 6.5 (H) 03/03/2020    Review of Glycemic Control Results for [name] [name], [name] [name]" (MRN [id number]) as of 03/10/2020 12:23  Ref. Range 03/09/2020 11:56 03/09/2020 15:40 03/09/2020 21:16 03/10/2020 07:20 03/10/2020 11:44  Glucose-Capillary Latest Ref Range: 70 - 99 mg/dL 170 (H) 159 (H) 101 (H) 111 (H) 63 (L)  Diabetes history: DM 2 Outpatient Diabetes medications:  Novolin 70/30 60 units bid Current orders for Inpatient glycemic control:  Novolog 0-15 units tid with meals Novolog 70/30 mix 40 units bid Inpatient Diabetes Program Recommendations:   Note mild low today.  Please consider reducing Novolog 70/30 mix to 25 units bid.   Thanks,  Adah Perl, RN, BC-ADM Inpatient Diabetes Coordinator Pager [phone] (8a-5p)

## 2020-03-10 NOTE — Progress Notes (Signed)
[facility] Progress Note Patient Name: [name] [name] DOB: 08-18-1955 MRN: [id number]   Date of Service  03/10/2020  HPI/Events of Note  Patient with decreased LOC and increased WOB. Hx severe COPD. Sat = 92% on 4 L/min [place] O2 and RR = 25-30.   eICU Interventions  Plan: 1. ABG STAT. 2. Will request that the ground team evaluate the patient at bedside.      Intervention Category Major Interventions: Change in mental status - evaluation and management;Other:  Lysle Dingwall 03/10/2020, 10:40 PM

## 2020-03-10 NOTE — Progress Notes (Signed)
Pt's daughter updated after intubation and worsening respiratory status.  Questions were answered and she plans to visit in the morning.   Otilio Carpen Eliany Mccarter, PA-C

## 2020-03-10 NOTE — Op Note (Signed)
Intubation Procedure Note  MICAH GALENO  [id number]  [birth year]  Date:03/10/20  Time:11:07 PM   Provider Performing:Modine Oppenheimer R Elizaveta Mattice    Procedure: Intubation (31500)  Indication(s) Respiratory Failure  Consent Unable to obtain consent due to emergent nature of procedure.   Anesthesia none   Time Out Unable to perform due to emergent nature   Sterile Technique Usual hand hygeine, masks, and gloves were used   Procedure Description Asked to eval the pt for progressive hypoxia by elink.  On arrival the pt was in severe resp distress and hypoxic.  The pt became bradycardic and apenic on arrival. Pt was ventilated with BVM while emergently setting up for intubation.  HR responded immediately.  No sedation was required for the intubation. Patient positioned in bed supine..  Patient was intubated with endotracheal tube using #3 miller blade laryngoscope.  View was Grade 1 full glottis .  Number of attempts was 1.  Colorimetric CO2 detector was consistent with tracheal placement.   Complications/Tolerance None; patient tolerated the procedure well. Chest X-ray is ordered to verify placement.   EBL 0   Specimen(s) None

## 2020-03-10 NOTE — Progress Notes (Signed)
Daughter came an has patient's purse, Cellphone and dentures at bedside.

## 2020-03-10 NOTE — Progress Notes (Addendum)
PROGRESS NOTE    [name] [name]  GYK:[id number] DOB: 12-31-1955 DOA: 02/26/2020 PCP: Jacelyn Pi, MD   Brief Narrative: This 64 yrs old female with PMH significant for morbid obesity, DM2, HTN, HLD, HFpEF, paroxysmal A. fib on Eliquis, PAD s/p right fem-pop bypass, carotid artery stenosis, COPD, chronic hilar lymphadenopathy presented in the emergency department with progressive generalized weakness, shortness of breath and worsening leg swelling. Patient was recently hospitalized in August 2021 for worsening of right lower extremity ischemia, underwent right-sided femoropopliteal bypass.  Since the surgery, patient has progressive bilateral leg swelling, right more than left.  She was started on Lasix 3 weeks ago by her PCP.  Leg swelling gradually improved but she has gotten progressively weaker, overall she feels very tired and has not been able to walk as much as before. She also has several days of diarrhea. Patient was admitted for severe hypokalemia with potassium of 2.0, elevated liver enzymes, low platelets.  CT abdomen showed significant increase in right hilar and subcarinal lymphadenopathy when compared with recent CT scan of the chest.  IR consulted, Patient underwent supraclavicular lymph node biopsy by IR 12/20.   12/21: Patient found to be more lethargic in the morning during rounds.  CT Head stat Negative.  Chest x-ray shows findings consistent with atypical pneumonia or edema.  X-ray abdomen negative for any acute abnormality.  ABG and ammonia level , UA unremarkable.  Lisinopril and Lasix resumed.  Assessment & Plan:   Active Problems:   Hypokalemia   Severe hypokalemia / Hypophosphatemia : >> Improved -She was found to have serum potassium of 2.0. and phosphorus 2.2 on arrival. -Secondary to use of Lasix as well as ongoing diarrhea -Potassium level improved with replacement.   -K+ Level 4.30 today.  Diarrhea >> Resolved -Improved.  Elevated liver  enzymes: -Likely related to fatty liver disease and previous use of alcohol. -Right upper quadrant ultrasound showed cirrhotic appearing liver without mass and an small amount of ascites. -Acute hepatitis panel negative. -LFTs trend as below.  AST and ALT improving but alk phos remains elevated -Keep statin on hold. -[place] consistent with new diagnosis of cirrhosis. -Continue current management.  Progressive hilar lymphadenopathy. Right supraclavicular lymphadenopathy. -CT abdomen showed significant increase in the right hilar and subcarinal lymphadenopathy. -CT chest showed mediastinal and right hilar adenopathy causing circumferential narrowing of the bronchus intermedius, right lower and middle lobe bronchi suspicious for malignancy. -PCCM consult appreciated.  Patient also has right supraclavicular lymphadenopathy.  IR consulted.  She underwent right supraclavicular lymph node biopsy by IR on 12/20,  awaiting pathology report.  Diabetes mellitus type 2 -On 60 units twice daily of insulin 70/30 at home.  Unclear compliance. -Continue insulin 70/30 twice a day 40 units..  Blood sugar remains consistently under 200.   -Continue continue to monitor with sliding scale insulin and Accu-Cheks.   PAD s/p b/l fem-pop bypass, carotid artery stenosis -Home meds includes Eliquis 5 mg twice daily, aspirin 81 mg daily and statin. -Keep Eliquis on hold  because of worsening thrombocytopenia.  Statin remains on hold. -Also continue Neurontin 300 mg 3 times daily.  Thrombocytopenia Continue to monitor platelet levels.  There is no signs of bleeding. Hold Eliquis for now, resume once platelet improves above 50K.  COPD - Resume bronchodilators. - Not in any acute exacerbation.  Chronic diastolic CHF decompensation -It seems that patient was taking HCTZ 25 mg daily and lisinopril 10 mg twice daily at home.  3 weeks prior to admission, Lasix 40  mg daily was also added because of pedal edema. -At  this time, patient has improving pedal edema.  She is not in acute CHF exacerbation. -Echo from August 2021 showed EF of 70%, grade 2 diastolic dysfunction, mild elevation of pulmonary systolic pressure. -Currently all 3 medications are on hold because of profound hypokalemia.   -Blood pressure remains stable. Lisinopril and lasix resumed. 12/21.  Morbid obesity  -Body mass index is 43.13 kg/m.  Patient has been advised to make an attempt to improve diet and exercise patterns to aid in weight loss.  History of psoriasis  -Uses clobetasol cream.  Not on systemic therapy.    Paroxysmal A. fib  -Currently in sinus rhythm with heart rate between 100 and 110.  -Continue anticoagulation with Eliquis, currently on hold due to thrombocytopenia. -Metoprolol 12.5 mg twice daily started for heart rate control.  Altered mental status could be multifactorial.: Patient found to be more lethargic in the morning during rounds.  CT Head stat , negative for acute intracranial abnormality.  Chest x-ray shows findings consistent with atypical pneumonia or edema.  X-ray abdomen negative for any acute abnormality.  ABG and ammonia level normal.  UA  negative.  NovoLog 70/30 reduced from 40 to 25 units twice daily. Lisinopril and Lasix resumed.  Patient continued to remain altered mentation.  PCCM consulted for eval.    DVT prophylaxis: SCDS. Code Status: Full code Family Communication: No family at bedside. Disposition Plan:   Status is: Inpatient  Remains inpatient appropriate because:Inpatient level of care appropriate due to severity of illness   Dispo: The patient is from: Home              Anticipated d/c is to: Home              Anticipated d/c date is: 3 days              Patient currently is not medically stable to d/c.   Consultants:   Gastroenterology, interventional radiology.  Procedures:  None Antimicrobials:  Anti-infectives (From admission, onward)   None       Subjective: Patient was seen and examined at bedside.  Overnight events noted.  She appears very lethargic, but  arousable.   She complains of abdominal pain,  denies any nausea and vomiting.  She still has significant pedal edema.   Objective: Vitals:   03/10/20 0500 03/10/20 0721 03/10/20 1011 03/10/20 1015  BP: 122/60   (!) 118/58  Pulse: 100  (!) 124 (!) 122  Resp: 18   20  Temp: 98 F (36.7 C)   98 F (36.7 C)  TempSrc: Oral   Oral  SpO2: 95% 90% 91%   Weight:      Height:        Intake/Output Summary (Last 24 hours) at 03/10/2020 1126 Last data filed at 03/09/2020 1941 Gross per 24 hour  Intake 240 ml  Output --  Net 240 ml   Filed Weights   03/13/2020 0747 03/06/20 0619  Weight: 117.9 kg 124.9 kg    Examination:  General exam: Appears calm and comfortable , not in any acute distress. Respiratory system: Clear on auscultation. Respiratory effort normal. Cardiovascular system: S1 & S2 heard, RRR. No JVD, murmurs, rubs, gallops or clicks. No pedal edema. Gastrointestinal system: Abdomen is nondistended, soft , mildly tender in Left abdomen. No organomegaly or masses felt.  Normal bowel sounds heard. Central nervous system: Lethargic,but  arousable . No focal neurological deficits. Extremities: No clubbing, no  cyanosis, 2+ pitting edema noted. Skin: No rashes, lesions or ulcers Psychiatry: Judgement and insight appear normal. Mood & affect appropriate.     Data Reviewed: I have personally reviewed following labs and imaging studies  CBC: Recent Labs  Lab 03/05/2020 0800 03/06/20 0522 03/07/20 0240 03/08/20 0041 03/09/20 0942 03/10/20 0110  WBC 12.2* 6.8 7.0 6.3 7.9 7.3  NEUTROABS 11.1* 5.9 6.0 5.6  --   --   HGB 12.8 10.5* 11.0* 10.3* 10.5* 10.6*  HCT 42.2 34.1* 34.1* 33.7* 33.0* 33.3*  MCV 78.4* 77.9* 77.1* 79.5* 78.8* 78.4*  PLT 74* 44* 42* 43* 40* 40*   Basic Metabolic Panel: Recent Labs  Lab 03/02/2020 0800 03/02/2020 1000 03/07/2020 1600  03/05/20 0413 03/06/20 0522 03/07/20 0240 03/08/20 0041 03/09/20 0942 03/10/20 0110  NA 143  --    < > 141 142 142 144 142 142  K 2.0*  --    < > 3.2* 2.5* 3.6 3.3* 4.3 4.3  CL 97*  --    < > 100 99 100 101 100 101  CO2 32  --    < > _0 33* 28 31  GLUCOSE 99  --    < > 133* 73 129* 144* 132* 107*  BUN 33*  --    < > 43* 44* 42* 38* 34* 39*  CREATININE 1.14*  --    < > 1.18* 0.92 1.02* 0.92 0.82 0.83  CALCIUM 9.0  --    < > 8.7* 8.5* 9.1 9.6 9.4 9.5  MG 2.3  --   --  2.3 2.2  --   --   --  2.5*  PHOS  --  2.2*  --  3.7 3.0  --   --   --  2.8   < > = values in this interval not displayed.   GFR: Estimated Creatinine Clearance: 91.5 mL/min (by C-G formula based on SCr of 0.83 mg/dL). Liver Function Tests: Recent Labs  Lab 03/06/20 0522 03/07/20 0240 03/08/20 0041 03/09/20 0128 03/10/20 0110  AST 269* 227* 166* 153* 131*  ALT 196* 179* 147* 134* 121*  ALKPHOS 183* 205* 186* 212* 197*  BILITOT 3.5* 4.8* 4.5* 5.2* 5.8*  PROT 4.7* 5.0* 5.2* 5.0* 4.8*  ALBUMIN 2.5* 2.9* 2.9* 2.6* 2.3*   Recent Labs  Lab 03/01/2020 0800  LIPASE 41   No results for input(s): AMMONIA in the last 168 hours. Coagulation Profile: Recent Labs  Lab 03/05/20 0413  INR 1.8*   Cardiac Enzymes: Recent Labs  Lab 03/17/2020 1728  CKTOTAL 123   BNP (last 3 results) No results for input(s): PROBNP in the last 8760 hours. HbA1C: No results for input(s): HGBA1C in the last 72 hours. CBG: Recent Labs  Lab 03/09/20 0741 03/09/20 1156 03/09/20 1540 03/09/20 2116 03/10/20 0720  GLUCAP 96 170* 159* 101* 111*   Lipid Profile: No results for input(s): CHOL, HDL, LDLCALC, TRIG, CHOLHDL, LDLDIRECT in the last 72 hours. Thyroid Function Tests: No results for input(s): TSH, T4TOTAL, FREET4, T3FREE, THYROIDAB in the last 72 hours. Anemia Panel: No results for input(s): VITAMINB12, FOLATE, FERRITIN, TIBC, IRON, RETICCTPCT in the last 72 hours. Sepsis Labs: No results for input(s): PROCALCITON,  LATICACIDVEN in the last 168 hours.  Recent Results (from the past 240 hour(s))  Resp Panel by RT-PCR (Flu A&B, Covid) Nasopharyngeal Swab     Status: None   Collection Time: 03/01/2020  8:27 AM   Specimen: Nasopharyngeal Swab; Nasopharyngeal(NP) swabs in vial transport medium  Result Value  Ref Range Status   SARS Coronavirus 2 by RT PCR NEGATIVE NEGATIVE Final    Comment: (NOTE) SARS-CoV-2 target nucleic acids are NOT DETECTED.  The SARS-CoV-2 RNA is generally detectable in upper respiratory specimens during the acute phase of infection. The lowest concentration of SARS-CoV-2 viral copies this assay can detect is 138 copies/mL. A negative result does not preclude SARS-Cov-2 infection and should not be used as the sole basis for treatment or other patient management decisions. A negative result may occur with  improper specimen collection/handling, submission of specimen other than nasopharyngeal swab, presence of viral mutation(s) within the areas targeted by this assay, and inadequate number of viral copies(<138 copies/mL). A negative result must be combined with clinical observations, patient history, and epidemiological information. The expected result is Negative.  Fact Sheet for Patients:  EntrepreneurPulse.com.au  Fact Sheet for Healthcare Providers:  IncredibleEmployment.be  This test is no t yet approved or cleared by the [place] FDA and  has been authorized for detection and/or diagnosis of SARS-CoV-2 by FDA under an Emergency Use Authorization (EUA). This EUA will remain  in effect (meaning this test can be used) for the duration of the COVID-19 declaration under Section 564(b)(1) of the Act, 21 U.S.C.section 360bbb-3(b)(1), unless the authorization is terminated  or revoked sooner.       Influenza A by PCR NEGATIVE NEGATIVE Final   Influenza B by PCR NEGATIVE NEGATIVE Final    Comment: (NOTE) The Xpert Xpress  SARS-CoV-2/FLU/RSV plus assay is intended as an aid in the diagnosis of influenza from Nasopharyngeal swab specimens and should not be used as a sole basis for treatment. Nasal washings and aspirates are unacceptable for Xpert Xpress SARS-CoV-2/FLU/RSV testing.  Fact Sheet for Patients: EntrepreneurPulse.com.au  Fact Sheet for Healthcare Providers: IncredibleEmployment.be  This test is not yet approved or cleared by the [place] FDA and has been authorized for detection and/or diagnosis of SARS-CoV-2 by FDA under an Emergency Use Authorization (EUA). This EUA will remain in effect (meaning this test can be used) for the duration of the COVID-19 declaration under Section 564(b)(1) of the Act, 21 U.S.C. section 360bbb-3(b)(1), unless the authorization is terminated or revoked.  Performed at [facility], [place] [address] [place], [place] [address]      Radiology Studies: DG Chest 1 View  Result Date: 03/10/2020 CLINICAL DATA:  Shortness of breath and nausea EXAM: CHEST  1 VIEW COMPARISON:  03/05/2020 FINDINGS: Cardiac shadow is stable. Aortic calcifications are noted. The previously seen airspace opacities on CT have increased in the interval from the prior exam. This may represent increasing pulmonary edema or recurrent atypical pneumonia. No sizable effusion is seen. No bony abnormality is noted. IMPRESSION: Increasing airspace opacities bilaterally consistent with increasing edema or atypical pneumonia. Electronically Signed   By: Inez Catalina M.D.   On: 03/10/2020 11:19   DG Abd 1 View  Result Date: 03/10/2020 CLINICAL DATA:  Abdominal pain with nausea and vomiting EXAM: ABDOMEN - 1 VIEW COMPARISON:  03/18/2020 FINDINGS: Scattered large and small bowel gas is noted. No definitive free air is seen. IUD is noted in place. Postsurgical changes in the right upper quadrant are seen. Degenerative changes of lumbar spine are noted. IMPRESSION:  No acute intra-abdominal abnormality noted. Electronically Signed   By: Inez Catalina M.D.   On: 03/10/2020 11:17   [place] CORE BIOPSY (LYMPH NODES)  Result Date: 03/09/2020 INDICATION: Right supraclavicular adenopathy EXAM: ULTRASOUND RIGHT SUPRACLAVICULAR ADENOPATHY CORE BIOPSY MEDICATIONS: 1% LIDOCAINE LOCAL ANESTHESIA/SEDATION:  Moderate (conscious) sedation was employed during this procedure. A total of Versed 0.5 mg and Fentanyl 25 mcg was administered intravenously. Moderate Sedation Time: 10 minutes. The patient's level of consciousness and vital signs were monitored continuously by radiology nursing throughout the procedure under my direct supervision. FLUOROSCOPY TIME:  Fluoroscopy Time: NONE. COMPLICATIONS: None immediate. PROCEDURE: Informed written consent was obtained from the patient after a thorough discussion of the procedural risks, benefits and alternatives. All questions were addressed. Maximal Sterile Barrier Technique was utilized including caps, mask, sterile gowns, sterile gloves, sterile drape, hand hygiene and skin antiseptic. A timeout was performed prior to the initiation of the procedure. Previous imaging reviewed. Preliminary ultrasound performed. Right supraclavicular adenopathy was localized and marked for biopsy. Under sterile conditions and local anesthesia, an 18 gauge core biopsy needle was advanced into the right supraclavicular adenopathy. 18 gauge core biopsies obtained. Needle position confirmed with ultrasound. Images obtained for documentation. Core biopsies placed in saline. These were intact and non fragmented. Postprocedure imaging demonstrates no hemorrhage or hematoma. Patient tolerated biopsy well. IMPRESSION: Successful ultrasound right supraclavicular adenopathy 18 gauge core biopsies Electronically Signed   By: Jerilynn Mages.  Shick M.D.   On: 03/09/2020 16:05    Scheduled Meds: . vitamin C  1,000 mg Oral Daily  . fluticasone furoate-vilanterol  1 puff Inhalation Daily  .  furosemide  40 mg Oral Daily  . gabapentin  300 mg Oral Daily  . insulin aspart  0-15 Units Subcutaneous TID WC  . insulin aspart protamine- aspart  40 Units Subcutaneous BID WC  . ipratropium-albuterol  3 mL Nebulization Q6H  . lisinopril  10 mg Oral BID  . multivitamin with minerals  1 tablet Oral Daily  . nicotine  7 mg Transdermal Daily  . sertraline  100 mg Oral Daily  . sodium chloride flush  3 mL Intravenous Q12H   Continuous Infusions: . sodium chloride       LOS: 6 days    Time spent: 35 mins    Deakin Lacek, MD [facility]   If 7PM-7AM, please contact night-coverage

## 2020-03-10 NOTE — Progress Notes (Signed)
NAME:  [name] [name], MRN:  [id number], DOB:  10-24-1955, LOS: 6 ADMISSION DATE:  03/17/2020, CONSULTATION DATE:  12/17 REFERRING MD:  Dr Pietro Cassis, CHIEF COMPLAINT:  Lung mass  Brief History:  64 year old female admitted with hypokalemia and abdominal pain.  PCCM consulted for consideration of biopsy in setting of R hilar mass, lymphadenopathy-- pt underwent IR lymph node bx instead on 12/20  12/21 reconsulted for AMS   History of Present Illness:  64 year old female with PMH as below, which is significant for COPD, PAF on Eliquis, DM, HTN, and HFpEF. She is followed in the pulmonary clinic by Dr Erin Fulling for multifactorial dyspnea. She has been treated with lasix, nebs, and was planning for sleep study in January. After starting lasix, she had not been able to have labs done initially and was again requested to have labs done 12/11. Labs done 12/13 demonstrated hypokalemia and transaminitis and was referred to the ED for further workup considering potassium of 2.4. CT abdomen was done to work up LFT elevation and abdominal pain. CT abdomen described lymphadenopathy prompting CT chest which found right hilar adenopathy with narrowing of bronchus intermedius with other less prominent areas of LAN. PCCM was consulted for further evaluation of lymphadenopathy.  Past Medical History:   has a past medical history of Abdominal abscess, Allergic rhinitis, Arthritis, Asthma, Bacteremia (03/2017), Carotid stenosis, left (04/2009), Colon polyps (12/05), COPD (chronic obstructive pulmonary disease) ([place]), Depression, Diabetes mellitus, Dyspnea, Folate deficiency, Hyperlipidemia, Hypertension, Lumbar disc disease, Obesity, morbid (more than 100 lbs over ideal weight or BMI > 40) ([place]), Peripheral vascular disease ([place]), Psoriasis, Thrombocytopenia ([place]), and Tobacco abuse.   Significant Hospital Events:  12/15 admit 12/17 PCCM consult regarding R hilar mass. IR consult  12/20 R neck core lymph node bx with  IR 12/21 AMS, PCCM consulted for possible ICU transfer  Consults:  PCCM - reconsulted 12/21 IR  Procedures:    Significant Diagnostic Tests:  CT abdomen 12/15 > Significant increase in right hilar and subcarinal lymphadenopathy  when compared with the recent CT of the chest from August of 2021. CT of the chest ideally with contrast material is recommended for further evaluation. Fatty liver. New mild ascites and changes of anasarca Anterior abdominal wall hernia similar to that seen in 2014 containing loops of bowel without evidence of incarceration. US abdomen 12/16 > Post cholecystectomy. Cirrhotic appearing liver without mass. Small amount of ascites. CT chest 12/16 > Mediastinal and right hilar adenopathy, progressed from prior exam, suspicious for malignancy. Right hilar adenopathy causes circumferential narrowing of the bronchus intermedius, right lower and middle lobe bronchi. There is no discrete pulmonary nodule or mass to suggest primary pulmonary malignancy. There also enlarged lymph nodes in the right supraclavicular and upper abdomen in the celiac stations.   -RUQ 12/16 [place] with cirrhotic changes and small amount of ascites  CXR 12/21> increasing bilateral opacities -- edema vs PNA  CT H 12/21>-- no acute intracranial abnormality seen, but limited by motion degradation   Micro Data:    Antimicrobials:     Interim History / Subjective:  Patient has been progressively altered over the course of the day She has also been more tachycardic  Objective   Blood pressure (!) 111/43, pulse (!) 122, temperature 98.2 F (36.8 C), temperature source Oral, resp. rate 20, height 5\' 7"  (1.702 m), weight 124.9 kg, SpO2 95 %.        Intake/Output Summary (Last 24 hours) at 03/10/2020 1559 Last data filed  at 03/09/2020 1941 Gross per 24 hour  Intake 240 ml  Output --  Net 240 ml   Filed Weights   03/06/2020 0747 03/06/20 0619  Weight: 117.9 kg 124.9 kg     Examination: General: Obese, acutely and chronically ill appearing older adult F, reclined in bed  HENT: NCAT. Dry mm. Trachea midline.  Lungs: Coarse rhonchi bilateral upper airway. Lower lobes are clear. Symmetrical chest expansion. No accessory use  Cardiovascular: tachycardic, s1s2 cap refill < 3  Abdomen: Soft round ndnt  Extremities: 2+ BLE edema. Non pitting BUE edema. No obvious acute joint deformity.  Neuro: Drowsy, altered and disoriented. PERRL 53mm   Resolved Hospital Problem list     Assessment & Plan:   Acute Encephalopathy -Ammonia ok, LFTs trending down, AM BMP CBC reassuring  -CT H -- no acute intracranial abnormality seen, but limited by motion degradation  -ABG has an unexpectedly low pO2 of 57, could be AMS 2/2 hypoxia (SpO2 >95 on 4L on monitor) - decreased UOP-- 12/21 has had <0.71ml/kg output  -Urine is concentrated appearing. Possible that pt is intravascularly depleted -- hypovolemia would also explain this new tachycardia  -intermittently is hypoglycemic -- ?if hypoglycemia caused sz?  -does not have fever or leukocytosis P -recheck ABG, LA STAT -correct hypoglycemia  -Transfer to ICU -- POCUS to eval volume status -hypoxia as below  -PCT  -if POCUS is not revealing or elevated LA, start abx  -will start dextrose infusion   Acute hypoxic respiratory failure -CXR 12/21 with bilat ASD -- looks like Pulmonary Edema -ABG was 7.4/34/57-- PO2lower than I would expect with SpO2 98% on 4L. Has been off of anticoag for several days + new tachycardia-- possible PE? Possible aspiration? COPD without acute exacerbation  - She will need outpatient follow up with Dr. Molli Barrows -has gotten 40mg  Lasix x2 on 12/21  -repeat STAT ABG  -supplemental O2 for goal > 88% -POCUS in ICU as above -If suspicious for PE, CTA.  -low threshold to start abx   HFpEF -assessment of volume status as above -will hold on further diuretic until volume status more clear    Hepatic cirrhosis -RUQ 12/16 [place] with cirrhotic changes and small amount of ascites -acute hepatits neg P -trending LFTs -Ammonia normal 12/21  Oliguria -unclear etiology-- possible hypovolemia? -poor UOP despite 2x doses of Lasix -Cr 0.83 BUN 39  Bilirubinuria, small -in setting of cirrhosis  -UA  P -Trend renal indices, Strict UOP  -FENa   Thrombocytopenia -- POA but new this admission. In September plt 211 -eliquis has been held for several days and pt not been of vte ppx  P -trend   DM2 with acute hypoglycemia  -Given new AMS with new recurring hypoglycemia, will start on d5LR gtt  -dc BID basal in interim.   Hilar lymphadenopathy: with compression of bronchus  -s/p lymph node bx with IR 12/20  Sleep disordered breathing - outpatient sleep study pending.    Best practice (evaluated daily)  Diet: NPO Pain/Anxiety/Delirium protocol (if indicated): na VAP protocol (if indicated): NA DVT prophylaxis: SCD GI prophylaxis: na  Glucose control: SSI  Mobility: per primary Disposition:Med-surg  Goals of Care:  Last date of multidisciplinary goals of care discussion: Family and staff present:  Summary of discussion:  Follow up goals of care discussion due:  Code Status: Full  Labs   CBC: Recent Labs  Lab 03/05/2020 0800 03/06/20 0522 03/07/20 0240 03/08/20 0041 03/09/20 0942 03/10/20 0110  WBC 12.2* 6.8 7.0 6.3  7.9 7.3  NEUTROABS 11.1* 5.9 6.0 5.6  --   --   HGB 12.8 10.5* 11.0* 10.3* 10.5* 10.6*  HCT 42.2 34.1* 34.1* 33.7* 33.0* 33.3*  MCV 78.4* 77.9* 77.1* 79.5* 78.8* 78.4*  PLT 74* 44* 42* 43* 40* 40*    Basic Metabolic Panel: Recent Labs  Lab 03/05/2020 0800 03/01/2020 1000 03/14/2020 1600 03/05/20 0413 03/06/20 0522 03/07/20 0240 03/08/20 0041 03/09/20 0942 03/10/20 0110  NA 143  --    < > 141 142 142 144 142 142  K 2.0*  --    < > 3.2* 2.5* 3.6 3.3* 4.3 4.3  CL 97*  --    < > 100 99 100 101 100 101  CO2 32  --    < > 28 31 30  33* 28 31   GLUCOSE 99  --    < > 133* 73 129* 144* 132* 107*  BUN 33*  --    < > 43* 44* 42* 38* 34* 39*  CREATININE 1.14*  --    < > 1.18* 0.92 1.02* 0.92 0.82 0.83  CALCIUM 9.0  --    < > 8.7* 8.5* 9.1 9.6 9.4 9.5  MG 2.3  --   --  2.3 2.2  --   --   --  2.5*  PHOS  --  2.2*  --  3.7 3.0  --   --   --  2.8   < > = values in this interval not displayed.   GFR: Estimated Creatinine Clearance: 91.5 mL/min (by C-G formula based on SCr of 0.83 mg/dL). Recent Labs  Lab 03/07/20 0240 03/08/20 0041 03/09/20 0942 03/10/20 0110  WBC 7.0 6.3 7.9 7.3    Liver Function Tests: Recent Labs  Lab 03/06/20 0522 03/07/20 0240 03/08/20 0041 03/09/20 0128 03/10/20 0110  AST 269* 227* 166* 153* 131*  ALT 196* 179* 147* 134* 121*  ALKPHOS 183* 205* 186* 212* 197*  BILITOT 3.5* 4.8* 4.5* 5.2* 5.8*  PROT 4.7* 5.0* 5.2* 5.0* 4.8*  ALBUMIN 2.5* 2.9* 2.9* 2.6* 2.3*   Recent Labs  Lab 03/09/2020 0800  LIPASE 41   Recent Labs  Lab 03/10/20 1145  AMMONIA 12    ABG    Component Value Date/Time   PHART 7.473 (H) 03/10/2020 1136   PCO2ART 34.1 03/10/2020 1136   PO2ART 57.3 (L) 03/10/2020 1136   HCO3 24.8 03/10/2020 1136   TCO2 28 10/30/2019 1549   ACIDBASEDEF 2.3 (H) 11/02/2019 2302   O2SAT 90.0 03/10/2020 1136     Coagulation Profile: Recent Labs  Lab 03/05/20 0413  INR 1.8*    Cardiac Enzymes: Recent Labs  Lab 03/03/2020 1728  CKTOTAL 123    HbA1C: Hgb A1c MFr Bld  Date/Time Value Ref Range Status  03/17/2020 08:00 AM 6.5 (H) 4.8 - 5.6 % Final    Comment:    (NOTE) Pre diabetes:          5.7%-6.4%  Diabetes:              >6.4%  Glycemic control for   <7.0% adults with diabetes   11/05/2019 12:43 AM 6.4 (H) 4.8 - 5.6 % Final    Comment:    (NOTE) Pre diabetes:          5.7%-6.4%  Diabetes:              >6.4%  Glycemic control for   <7.0% adults with diabetes     CBG: Recent Labs  Lab 03/09/20 1540 03/09/20  2116 03/10/20 0720 03/10/20 1144 03/10/20 1230   GLUCAP 159* 101* 111* 63* 81

## 2020-03-10 NOTE — Progress Notes (Signed)
[facility] Progress Note Patient Name: [name] [name] DOB: Apr 27, 1955 MRN: [id number]   Date of Service  03/10/2020  HPI/Events of Note  Multiple issues: 1. Hypoglycemia - Blood glucose = 46. Already given D50 and 2. Patient unable to take PO Metoprolol d/t lethargy.   eICU Interventions  Plan: 1. D10W IV infusion to run at 40 mL/hour.  2. D/C Metoprolol PO. 3. Metoprolol 2.5 mg IV Q 3 hours PRN HR > 115. Hold dose for DBP < 100.     Intervention Category Major Interventions: Arrhythmia - evaluation and management;Other:  Lysle Dingwall 03/10/2020, 9:29 PM

## 2020-03-10 NOTE — Progress Notes (Signed)
OT Cancellation Note  Patient Details Name: [name] MRN: [id number] DOB: [birth year]   Cancelled Treatment:    Reason Eval/Treat Not Completed: Medical issues which prohibited therapy (Pt with AMS earlier when working with PT and very weak. CT head with moderate to severe atrophy. OT to continue to follow.)   Jefferey Pica, OTR/L Acute Rehabilitation Services Pager: [phone] Office: [phone]   [name] C 03/10/2020, 3:56 PM

## 2020-03-11 ENCOUNTER — Inpatient Hospital Stay (HOSPITAL_COMMUNITY): Payer: Medicare Other

## 2020-03-11 DIAGNOSIS — I959 Hypotension, unspecified: Secondary | ICD-10-CM

## 2020-03-11 LAB — AMYLASE: Amylase: 62 U/L (ref 28–100)

## 2020-03-11 LAB — POCT I-STAT 7, (LYTES, BLD GAS, ICA,H+H)
Acid-base deficit: 11 mmol/L — ABNORMAL HIGH (ref 0.0–2.0)
Acid-base deficit: 6 mmol/L — ABNORMAL HIGH (ref 0.0–2.0)
Acid-base deficit: 7 mmol/L — ABNORMAL HIGH (ref 0.0–2.0)
Bicarbonate: 18.5 mmol/L — ABNORMAL LOW (ref 20.0–28.0)
Bicarbonate: 20.8 mmol/L (ref 20.0–28.0)
Bicarbonate: 23.2 mmol/L (ref 20.0–28.0)
Calcium, Ion: 1.08 mmol/L — ABNORMAL LOW (ref 1.15–1.40)
Calcium, Ion: 1.23 mmol/L (ref 1.15–1.40)
Calcium, Ion: 1.24 mmol/L (ref 1.15–1.40)
HCT: 30 % — ABNORMAL LOW (ref 36.0–46.0)
HCT: 33 % — ABNORMAL LOW (ref 36.0–46.0)
HCT: 35 % — ABNORMAL LOW (ref 36.0–46.0)
Hemoglobin: 10.2 g/dL — ABNORMAL LOW (ref 12.0–15.0)
Hemoglobin: 11.2 g/dL — ABNORMAL LOW (ref 12.0–15.0)
Hemoglobin: 11.9 g/dL — ABNORMAL LOW (ref 12.0–15.0)
O2 Saturation: 100 %
O2 Saturation: 86 %
O2 Saturation: 91 %
Patient temperature: 98.6
Potassium: 6.2 mmol/L — ABNORMAL HIGH (ref 3.5–5.1)
Potassium: 6.3 mmol/L (ref 3.5–5.1)
Potassium: 7 mmol/L (ref 3.5–5.1)
Sodium: 137 mmol/L (ref 135–145)
Sodium: 138 mmol/L (ref 135–145)
Sodium: 143 mmol/L (ref 135–145)
TCO2: 20 mmol/L — ABNORMAL LOW (ref 22–32)
TCO2: 22 mmol/L (ref 22–32)
TCO2: 25 mmol/L (ref 22–32)
pCO2 arterial: 50 mmHg — ABNORMAL HIGH (ref 32.0–48.0)
pCO2 arterial: 55.6 mmHg — ABNORMAL HIGH (ref 32.0–48.0)
pCO2 arterial: 62.6 mmHg — ABNORMAL HIGH (ref 32.0–48.0)
pH, Arterial: 7.129 — CL (ref 7.350–7.450)
pH, Arterial: 7.176 — CL (ref 7.350–7.450)
pH, Arterial: 7.228 — ABNORMAL LOW (ref 7.350–7.450)
pO2, Arterial: 212 mmHg — ABNORMAL HIGH (ref 83.0–108.0)
pO2, Arterial: 68 mmHg — ABNORMAL LOW (ref 83.0–108.0)
pO2, Arterial: 77 mmHg — ABNORMAL LOW (ref 83.0–108.0)

## 2020-03-11 LAB — SURGICAL PATHOLOGY

## 2020-03-11 LAB — PROCALCITONIN: Procalcitonin: 6.91 ng/mL

## 2020-03-11 LAB — CBC
HCT: 35.6 % — ABNORMAL LOW (ref 36.0–46.0)
Hemoglobin: 10.4 g/dL — ABNORMAL LOW (ref 12.0–15.0)
MCH: 24.6 pg — ABNORMAL LOW (ref 26.0–34.0)
MCHC: 29.2 g/dL — ABNORMAL LOW (ref 30.0–36.0)
MCV: 84.4 fL (ref 80.0–100.0)
Platelets: 62 10*3/uL — ABNORMAL LOW (ref 150–400)
RBC: 4.22 MIL/uL (ref 3.87–5.11)
RDW: 26.9 % — ABNORMAL HIGH (ref 11.5–15.5)
WBC: 4.3 10*3/uL (ref 4.0–10.5)
nRBC: 40.6 % — ABNORMAL HIGH (ref 0.0–0.2)

## 2020-03-11 LAB — COMPREHENSIVE METABOLIC PANEL
ALT: 279 U/L — ABNORMAL HIGH (ref 0–44)
AST: 844 U/L — ABNORMAL HIGH (ref 15–41)
Albumin: 1.5 g/dL — ABNORMAL LOW (ref 3.5–5.0)
Alkaline Phosphatase: 155 U/L — ABNORMAL HIGH (ref 38–126)
Anion gap: 23 — ABNORMAL HIGH (ref 5–15)
BUN: 51 mg/dL — ABNORMAL HIGH (ref 8–23)
CO2: 18 mmol/L — ABNORMAL LOW (ref 22–32)
Calcium: 9.8 mg/dL (ref 8.9–10.3)
Chloride: 102 mmol/L (ref 98–111)
Creatinine, Ser: 2.01 mg/dL — ABNORMAL HIGH (ref 0.44–1.00)
GFR, Estimated: 27 mL/min — ABNORMAL LOW (ref >60)
Glucose, Bld: 88 mg/dL (ref 70–99)
Potassium: 6.4 mmol/L (ref 3.5–5.1)
Sodium: 143 mmol/L (ref 135–145)
Total Bilirubin: 4.8 mg/dL — ABNORMAL HIGH (ref 0.3–1.2)
Total Protein: 3.4 g/dL — ABNORMAL LOW (ref 6.5–8.1)

## 2020-03-11 LAB — PATHOLOGIST SMEAR REVIEW

## 2020-03-11 LAB — GLUCOSE, CAPILLARY
Glucose-Capillary: 116 mg/dL — ABNORMAL HIGH (ref 70–99)
Glucose-Capillary: 66 mg/dL — ABNORMAL LOW (ref 70–99)
Glucose-Capillary: 68 mg/dL — ABNORMAL LOW (ref 70–99)
Glucose-Capillary: 79 mg/dL (ref 70–99)
Glucose-Capillary: 81 mg/dL (ref 70–99)
Glucose-Capillary: 86 mg/dL (ref 70–99)
Glucose-Capillary: 86 mg/dL (ref 70–99)

## 2020-03-11 LAB — LACTIC ACID, PLASMA
Lactic Acid, Venous: >11 mmol/L (ref 0.5–1.9)
Lactic Acid, Venous: >11 mmol/L (ref 0.5–1.9)

## 2020-03-11 LAB — TRIGLYCERIDES: Triglycerides: 72 mg/dL (ref <150)

## 2020-03-11 LAB — PHOSPHORUS: Phosphorus: 7 mg/dL — ABNORMAL HIGH (ref 2.5–4.6)

## 2020-03-11 MED ORDER — SODIUM BICARBONATE 8.4 % IV SOLN
50.0000 meq | Freq: Once | INTRAVENOUS | Status: AC
  Administered 2020-03-11: 50 meq via INTRAVENOUS

## 2020-03-11 MED ORDER — CALCIUM GLUCONATE-NACL 1-0.675 GM/50ML-% IV SOLN
1.0000 g | Freq: Once | INTRAVENOUS | Status: AC
  Administered 2020-03-11: 1000 mg via INTRAVENOUS
  Filled 2020-03-11: qty 50

## 2020-03-11 MED ORDER — PANTOPRAZOLE SODIUM 40 MG IV SOLR: 40.0000 mg | Freq: Every day | INTRAVENOUS | Status: DC

## 2020-03-11 MED ORDER — VASOPRESSIN 20 UNITS/100 ML INFUSION FOR SHOCK
0.0000 [IU]/min | INTRAVENOUS | Status: DC
  Administered 2020-03-11: 0.03 [IU]/min via INTRAVENOUS
  Filled 2020-03-11 (×2): qty 100

## 2020-03-11 MED ORDER — SODIUM CHLORIDE 0.9 % IV SOLN: 250.0000 mL | INTRAVENOUS | Status: DC

## 2020-03-11 MED ORDER — SODIUM BICARBONATE 8.4 % IV SOLN
INTRAVENOUS | Status: AC
  Filled 2020-03-11: qty 200

## 2020-03-11 MED ORDER — DIGOXIN 0.25 MG/ML IJ SOLN: 0.5000 mg | Freq: Once | INTRAMUSCULAR | Status: DC

## 2020-03-11 MED ORDER — SODIUM BICARBONATE 8.4 % IV SOLN
INTRAVENOUS | Status: AC
  Filled 2020-03-11: qty 100

## 2020-03-11 MED ORDER — SODIUM CHLORIDE 0.9 % IV BOLUS
1000.0000 mL | Freq: Once | INTRAVENOUS | Status: AC
  Administered 2020-03-11: 1000 mL via INTRAVENOUS

## 2020-03-11 MED ORDER — DEXTROSE 50 % IV SOLN
INTRAVENOUS | Status: AC
  Administered 2020-03-11: 50 mL
  Filled 2020-03-11: qty 50

## 2020-03-11 MED ORDER — SODIUM BICARBONATE 8.4 % IV SOLN
100.0000 meq | Freq: Once | INTRAVENOUS | Status: AC
  Administered 2020-03-11: 100 meq via INTRAVENOUS

## 2020-03-11 MED ORDER — DIGOXIN 0.1 MG/ML IJ SOLN: 0.5000 mg | Freq: Once | INTRAMUSCULAR | Status: DC

## 2020-03-11 MED ORDER — LACTATED RINGERS IV BOLUS
1000.0000 mL | Freq: Once | INTRAVENOUS | Status: AC
  Administered 2020-03-11: 1000 mL via INTRAVENOUS

## 2020-03-11 MED ORDER — CALCIUM CHLORIDE 10 % IV SOLN
1.0000 g | Freq: Once | INTRAVENOUS | Status: AC
  Administered 2020-03-11: 1 g via INTRAVENOUS
  Filled 2020-03-11: qty 10

## 2020-03-11 MED ORDER — SODIUM ZIRCONIUM CYCLOSILICATE 5 G PO PACK
10.0000 g | PACK | Freq: Once | ORAL | Status: AC
  Administered 2020-03-11: 10 g
  Filled 2020-03-11: qty 2

## 2020-03-11 MED ORDER — NOREPINEPHRINE 4 MG/250ML-% IV SOLN
2.0000 ug/min | INTRAVENOUS | Status: DC
  Administered 2020-03-11: 2 ug/min via INTRAVENOUS
  Filled 2020-03-11: qty 250

## 2020-03-11 MED ORDER — SODIUM CHLORIDE 0.9 % IV SOLN
2.0000 g | Freq: Two times a day (BID) | INTRAVENOUS | Status: DC
  Administered 2020-03-11: 2 g via INTRAVENOUS
  Filled 2020-03-11: qty 2

## 2020-03-11 MED ORDER — STERILE WATER FOR INJECTION IV SOLN
INTRAVENOUS | Status: DC
  Filled 2020-03-11 (×2): qty 850

## 2020-03-11 MED ORDER — PHENYLEPHRINE CONCENTRATED 100MG/250ML (0.4 MG/ML) INFUSION SIMPLE
0.0000 ug/min | INTRAVENOUS | Status: DC
  Administered 2020-03-11: 400 ug/min via INTRAVENOUS
  Filled 2020-03-11 (×2): qty 250

## 2020-03-11 MED ORDER — PHENYLEPHRINE HCL-NACL 10-0.9 MG/250ML-% IV SOLN
INTRAVENOUS | Status: AC
  Filled 2020-03-11: qty 250

## 2020-03-11 MED ORDER — HYDROCORTISONE NA SUCCINATE PF 100 MG IJ SOLR
100.0000 mg | Freq: Once | INTRAMUSCULAR | Status: AC
  Administered 2020-03-11: 100 mg via INTRAVENOUS
  Filled 2020-03-11: qty 2

## 2020-03-11 MED ORDER — SODIUM BICARBONATE 8.4 % IV SOLN
100.0000 meq | Freq: Once | INTRAVENOUS | Status: AC
  Administered 2020-03-11: 100 meq via INTRAVENOUS
  Filled 2020-03-11: qty 100

## 2020-03-11 MED ORDER — FENTANYL CITRATE (PF) 100 MCG/2ML IJ SOLN
100.0000 ug | Freq: Once | INTRAMUSCULAR | Status: AC
  Administered 2020-03-11: 100 ug via INTRAVENOUS

## 2020-03-11 MED ORDER — VANCOMYCIN HCL 2000 MG/400ML IV SOLN
2000.0000 mg | Freq: Once | INTRAVENOUS | Status: AC
  Administered 2020-03-11: 2000 mg via INTRAVENOUS
  Filled 2020-03-11: qty 400

## 2020-03-11 MED ORDER — VANCOMYCIN HCL 1500 MG/300ML IV SOLN: 1500.0000 mg | INTRAVENOUS | Status: DC

## 2020-03-11 MED ORDER — NOREPINEPHRINE 16 MG/250ML-% IV SOLN
0.0000 ug/min | INTRAVENOUS | Status: DC
  Administered 2020-03-11: 70 ug/min via INTRAVENOUS
  Filled 2020-03-11 (×2): qty 250

## 2020-03-11 MED ORDER — PHENYLEPHRINE HCL-NACL 10-0.9 MG/250ML-% IV SOLN
25.0000 ug/min | INTRAVENOUS | Status: DC
  Administered 2020-03-11: 25 ug/min via INTRAVENOUS
  Administered 2020-03-11: 50 ug/min via INTRAVENOUS
  Filled 2020-03-11: qty 250

## 2020-03-17 MED FILL — Medication: Qty: 1 | Status: AC

## 2020-03-21 NOTE — Progress Notes (Signed)
eLink Physician-Brief Progress Note Patient Name: [name] [name] DOB: 1956/03/16 MRN: [id number]   Date of Service  2020/04/05  HPI/Events of Note  Hypotension - Post intubation. BP = 60/41 with MAP = 48.   eICU Interventions  Plan: 1. 0.9 NaCl 1 liter IV over 1 hour now.  2. Phenylephrine IV infusion via PIV. Titrate to MAP >= 65.      Intervention Category Major Interventions: Hypotension - evaluation and management  Donald Memoli Eugene Apr 05, 2020, 12:12 AM

## 2020-03-21 NOTE — Procedures (Signed)
Arterial Catheter Insertion Procedure Note  AINARA ELDRIDGE  [id number]  03-Dec-1955  Date:03-22-2020  Time:1:40 AM    Provider Performing: Otilio Carpen Kmarion Rawl    Procedure: Insertion of Arterial Line [phone]) with US guidance ([id number])   Indication(s) Blood pressure monitoring and/or need for frequent ABGs  Consent Unable to obtain consent due to emergent nature of procedure.  Anesthesia None   Time Out Verified patient identification, verified procedure, site/side was marked, verified correct patient position, special equipment/implants available, medications/allergies/relevant history reviewed, required imaging and test results available.   Sterile Technique Maximal sterile technique including full sterile barrier drape, hand hygiene, sterile gown, sterile gloves, mask, hair covering, sterile ultrasound probe cover (if used).   Procedure Description Area of catheter insertion was cleaned with chlorhexidine and draped in sterile fashion. With real-time ultrasound guidance an arterial catheter was placed into the right radial artery.  Appropriate arterial tracings confirmed on monitor.     Complications/Tolerance None; patient tolerated the procedure well.   EBL Minimal   Specimen(s) None   Otilio Carpen Daivik Overley, PA-C

## 2020-03-21 NOTE — Death Summary Note (Addendum)
DEATH SUMMARY   Patient Details  Name: [name] [name] MRN: [id number] DOB: March 24, 1955  Admission/Discharge Information   Admit Date:  03/20/2020  Date of Death: Date of Death: Mar 27, 2020  Time of Death: Time of Death: 0619  Length of Stay: 7  Referring Physician: Jacelyn Pi, MD   Reason(s) for Hospitalization  Shortness of breath:  acute on chronic diastolic heart failure, Hypokalemia, Transaminitis   Diagnoses  Preliminary cause of death: Acute liver failure Secondary Diagnoses (including complications and co-morbidities):  Active Problems:   Hypokalemia   Hypotension Thrombocytopenia Hypoglycemia Lactic Acidosis Shock Acute respiratory failure with hypoxia requiring mechanical ventilation Acute kidney injury Oliguria Anion gap metabolic acidosis Hyperkalemia Hyperphosphatemia Pulmonary Edema Acute encephalopathy Transaminitis Hyperbilirubinemia Biliurinemia Coagulopathy Small volume ascites  Decompensated hepatic cirrhosis  DM2 COPD Sleep disordered breathing Diastolic heart failure PAD Carotid artery stenosis  Morbid obesity Atrial Fibrillation  Metastatic small cell carcinoma of the lung Hilar lymphadenopathy Supraclavicular lymphadenopathy History of alcohol use disorder  Peripheral vascular disease Carotid artery stenosis   Brief Hospital Course (including significant findings, care, treatment, and services provided and events leading to death)  [name] [name] is a 65 y.o. year old female who presented to the emergency department 03/20/2020 after discovery of hypokalemia and elevated liver enzymes, with associated shortness of breath and progressive fatigue.ED labs significant for K 2, Cr 1.14, AST/ALT/Alk phos 175/125/221, Plt 74  A CT of the abdomen was obtained which revealed fatty liver changes and mild perihepatic ascites. GI was consulted in this setting on 03/21/23. A RUQ ultrasound revealed nodular hepatic changes, perihepatic ascites  suggestive of decompensated hepatic cirrhosis. The patient's thrombocytopenia worsened, plt < 50, and in this setting anticoagulation was held. On 12/17, PCCM and IR were consulted regarding the findings of lymphadenopathy, and the patient underwent a lymph node biopsy with IR on 12/20 with IR. Received diuretics through hospital course for heart failure. LFTs slowing improving. On 12/21 the patient had new and progressively worsening acute encephalopathy. Patient was hypoglycemic with Glucose 63- given dextrose. CT Head did not reveal acute intracranial abnormalities, Na and K normal and BUN/Cr was 39/0.83.  Alk phos 197, AST 131, ALT 121. Ammonia was within normal limits, Bilirubin elevated to 5.8. normal WBC, afebrile. ABG 7.4/34/57, with SpO2 98% on 4LNC. CXR suggestive of pulmonary edema, and patient was given diuretics. Patient was oliguric despite multiple doses of lasix.   The patient was transferred to the ICU - labs: paO2 52, LA 7.5, Glu 57.POCUS suggestive of volume depletion. Given IVF bolus and started on dextrose infusion. Cefepime was started. Despite these efforts, the patient continued to decline, with worsening hypoglycemia despite escalating dextrose support, worsening lactic acidosis: 7.5 to 8.1 to >11. The patient required intubation due to worsening encephalopathy, and exhibed progressive acidosis and associated multisystem organ failure-- Acute kidney failure and acute liver failure with associated coagulopathy INR 2.3. On 2023/03/28 The patient required escalating doses of multiple pressors, and attempted metabolic temporization with sodium bicarb. Labs revealed worsening multisystem organ failure-- with associated hyperkalemia, hyperphosphatemia. After conversation with the family, the patient's code status was changed to DNR. Despite best efforts at medical management, the patient died on 03-27-2020 at 0619.   Pertinent Labs and Studies  Significant Diagnostic Studies CT ABDOMEN  PELVIS WO CONTRAST  Result Date: 03/20/2020 CLINICAL DATA:  Elevated LFTs with diffuse abdominal pain EXAM: CT ABDOMEN AND PELVIS WITHOUT CONTRAST TECHNIQUE: Multidetector CT imaging of the abdomen and pelvis was performed following the standard protocol without IV  contrast. COMPARISON:  06/05/2012, CT of the chest from 11/03/2019 FINDINGS: Lower chest: Lung bases are free of acute infiltrate or sizable effusion. There are however changes consistent with right hilar lymphadenopathy measuring 4.6 x 3.6 cm increased in size from the prior CT of the chest. Additionally subcarinal adenopathy is noted which is incompletely evaluated measuring 3.9 cm in short axis. This is also increased from the prior study. Hepatobiliary: Gallbladder has been surgically removed. Liver demonstrates fatty infiltration without focal definitive mass. Mild perihepatic ascites is seen. Pancreas: Unremarkable. No pancreatic ductal dilatation or surrounding inflammatory changes. Spleen: Normal in size without focal abnormality. Adrenals/Urinary Tract: Adrenal glands are within normal limits. Kidneys demonstrate renal vascular calcifications although no obstructive changes are seen. The ureters are within normal limits. The bladder is partially distended. Stomach/Bowel: Scattered diverticular change of the colon is noted without obstructive change. The appendix is within normal limits. No obstructive changes are noted in the small bowel. The stomach is decompressed. Anterior abdominal wall hernia is identified containing loops of bowel although no incarceration is noted. These findings are stable from the prior exam. Vascular/Lymphatic: Aortic atherosclerosis. No enlarged abdominal or pelvic lymph nodes. Reproductive: Uterus and bilateral adnexa are unremarkable. IUD is noted in place. Other: Mild ascites is seen in the pelvis and surrounding the liver and spleen. This is new from the prior exam from August of this year. Mild changes of  anasarca are noted as well. Musculoskeletal: Degenerative changes of lumbar spine are noted. Compression deformity of L3 is noted progressed in the interval from 2014. IMPRESSION: Significant increase in right hilar and subcarinal lymphadenopathy when compared with the recent CT of the chest from August of 2021. CT of the chest ideally with contrast material is recommended for further evaluation. Fatty liver. New mild ascites and changes of anasarca Anterior abdominal wall hernia similar to that seen in 2014 containing loops of bowel without evidence of incarceration. Diverticulosis without diverticulitis. Electronically Signed   By: Inez Catalina M.D.   On: 03/08/2020 11:38   DG Chest 1 View  Result Date: 03/10/2020 CLINICAL DATA:  Shortness of breath and nausea EXAM: CHEST  1 VIEW COMPARISON:  03/05/2020 FINDINGS: Cardiac shadow is stable. Aortic calcifications are noted. The previously seen airspace opacities on CT have increased in the interval from the prior exam. This may represent increasing pulmonary edema or recurrent atypical pneumonia. No sizable effusion is seen. No bony abnormality is noted. IMPRESSION: Increasing airspace opacities bilaterally consistent with increasing edema or atypical pneumonia. Electronically Signed   By: Inez Catalina M.D.   On: 03/10/2020 11:19   DG Abd 1 View  Result Date: 03/10/2020 CLINICAL DATA:  Abdominal pain with nausea and vomiting EXAM: ABDOMEN - 1 VIEW COMPARISON:  02/26/2020 FINDINGS: Scattered large and small bowel gas is noted. No definitive free air is seen. IUD is noted in place. Postsurgical changes in the right upper quadrant are seen. Degenerative changes of lumbar spine are noted. IMPRESSION: No acute intra-abdominal abnormality noted. Electronically Signed   By: Inez Catalina M.D.   On: 03/10/2020 11:17   CT HEAD WO CONTRAST  Result Date: 03/10/2020 CLINICAL DATA:  Mental status change, unknown cause. EXAM: CT HEAD WITHOUT CONTRAST TECHNIQUE:  Contiguous axial images were obtained from the base of the skull through the vertex without intravenous contrast. COMPARISON:  CT angiogram head/neck 03/20/2017. FINDINGS: Brain: The examination is significantly motion degraded. Most notably, the exam is severely motion degraded at the level of the posterior fossa, skull base and  face and moderate to severely motion degraded at the level of the frontoparietal vertex. Mild to moderate cerebral atrophy. Mild ill-defined hypoattenuation within the cerebral white matter is nonspecific, but compatible chronic small vessel ischemic disease. Within the limitations of motion degradation, no acute intracranial hemorrhage or demarcated cortical infarct is identified. No extra-axial fluid collection. No evidence of intracranial mass. No midline shift. Vascular: No hyperdense vessel is identified. Atherosclerotic calcifications Skull: Normal. Negative for fracture or focal lesion. Sinuses/Orbits: No appreciable acute orbital abnormality or significant paranasal sinus disease. IMPRESSION: Examination limited by significant motion degradation as described. Within this limitation, no acute intracranial abnormality is identified. Mild cerebral white matter chronic small vessel ischemic disease. Mild-to-moderate cerebral atrophy. Electronically Signed   By: Kellie Simmering DO   On: 03/10/2020 12:09   CT CHEST W CONTRAST  Result Date: 03/05/2020 CLINICAL DATA:  Shortness of breath and leg swelling. Worsening lymphadenopathy. EXAM: CT CHEST WITH CONTRAST TECHNIQUE: Multidetector CT imaging of the chest was performed during intravenous contrast administration. CONTRAST:  64m OMNIPAQUE IOHEXOL 300 MG/ML  SOLN COMPARISON:  Chest radiograph yesterday. Included portions from abdominal CT yesterday. Chest CT 11/03/2019 FINDINGS: Cardiovascular: Aortic atherosclerosis. Conventional branching pattern from the aortic arch. No filling defects in the central pulmonary arteries to the  segmental level to suggest pulmonary embolus. Right hilar adenopathy causes narrowing of the right pulmonary arteries. Coronary artery calcifications versus stents. Heart is normal in size. Mediastinum/Nodes: Progressive right hilar and subcarinal adenopathy that appears bulky. Largest discrete right hilar node measures 2.1 cm short axis dimension, series 3, image 63. Subcarinal nodal conglomerate spans 4.3 x 4.1 cm, series 3, image 72. There are enlarged lower paratracheal nodes, measuring up to 2 cm, series 3, image 35. There is no left hilar adenopathy. Enlarged right supraclavicular nodes, 18 mm short axis, series 3, image 14. There is no axillary adenopathy. No internal mammary adenopathy. The esophagus is decompressed, midportion cannot be delineated from adjacent subcarinal lymph nodes. No suspicious thyroid nodule. Lungs/Pleura: Mild geographic ground-glass opacities which are most prominent in the right and left upper lobe, significant improvement from CT 4 months ago. Right hilar adenopathy causes circumferential narrowing of the right bronchus intermedius, middle lobe and lower lobe bronchus. Associated right lower lobe bronchial thickening. Linear atelectasis in both lower lobes. There is no dominant pulmonary mass. No pulmonary nodule other than a slightly nodular appearance of some of the geographic ground-glass opacities. No pleural fluid. Minimal emphysema. Upper Abdomen: Upper abdominal ascites is seen on abdominal CT yesterday there is Peri celiac adenopathy, for example 2.6 mm node series 3, image 142 upper abdominal adenopathy is difficult to delineate given phase of contrast. Musculoskeletal: No acute osseous abnormality. No blastic or destructive lytic lesion. Multiple remote right rib fractures. IMPRESSION: 1. Mediastinal and right hilar adenopathy, progressed from prior exam, suspicious for malignancy. Right hilar adenopathy causes circumferential narrowing of the bronchus intermedius, right  lower and middle lobe bronchi. There is no discrete pulmonary nodule or mass to suggest primary pulmonary malignancy. There also enlarged lymph nodes in the right supraclavicular and upper abdomen in the celiac stations. Consider further workup with either PET-CT or tissue sampling. 2. Mild geographic ground-glass opacities in both upper lobes, significant improvement from CT 4 months ago. This may be related to pulmonary edema or atypical infection. The appearance is not typical for neoplasm. 3. Minimal emphysema. Aortic Atherosclerosis (ICD10-I70.0). Emphysema (ICD10-J43.9). Electronically Signed   By: MKeith RakeM.D.   On: 03/05/2020 23:28   Portable Chest  xray  Result Date: 2020-03-28 CLINICAL DATA:  Central line placement. EXAM: PORTABLE CHEST 1 VIEW COMPARISON:  28-Mar-2020 FINDINGS: There is stable endotracheal tube and nasogastric tube positioning. Interval left-sided venous catheter placement is seen with its distal tip noted at the junction of the superior vena cava and right atrium. Multifocal opacities are again seen throughout both lungs, right greater than left. This is stable in severity when compared to the prior exam. There is no evidence of a pleural effusion or pneumothorax. The heart size and mediastinal contours are within normal limits. There is moderate severity calcification of the aortic arch. Multilevel degenerative changes seen throughout the thoracic spine. IMPRESSION: 1. Interval left-sided venous catheter placement positioning, as described above, when compared to the prior exam. 2. Stable bilateral multifocal infiltrates, right greater than left. Electronically Signed   By: Virgina Norfolk M.D.   On: 2020/03/28 02:59   DG CHEST PORT 1 VIEW  Result Date: 03/10/2020 CLINICAL DATA:  Intubation EXAM: PORTABLE CHEST 1 VIEW COMPARISON:  CT 03/05/2020, radiograph 03/10/2020 FINDINGS: Endotracheal tube terminates in the mid trachea, approximately 5.5 cm from the carina.  Transesophageal tube side port is beyond the GE junction in the left upper quadrant with the tip below the margins of imaging. Telemetry leads overlie the chest. Increasing heterogeneous opacity throughout both lungs, particularly throughout the right lung and in the left mid lung, only partially attributable to some diminished lung volumes. No pneumothorax or visible effusion the portion of the right costophrenic sulcus is collimated. Cardiomediastinal silhouette is likely stable accounting for differences in technique and volumes. The aorta is calcified. The remaining cardiomediastinal contours are unremarkable. No acute osseous or soft tissue abnormality. IMPRESSION: 1. Increasing heterogeneous opacity throughout both lungs, particularly throughout the right lung and in the left mid lung, concerning for worsening pneumonia and/or edema with volume loss. 2. Endotracheal tube terminates in the mid trachea, approximately 5.5 cm from the carina. 3. Transesophageal tube tip and side port distal to the GE junction. Electronically Signed   By: Lovena Le M.D.   On: 03/10/2020 23:48   DG Chest Port 1 View  Result Date: 03/10/2020 CLINICAL DATA:  Shortness of breath. EXAM: PORTABLE CHEST 1 VIEW COMPARISON:  January 23, 2020 FINDINGS: Lungs are clear. Heart size and pulmonary vascularity are normal. No adenopathy. There is aortic atherosclerosis. No bone lesions. IMPRESSION: Lungs clear.  Stable cardiac silhouette. Aortic Atherosclerosis (ICD10-I70.0). Electronically Signed   By: Lowella Grip III M.D.   On: 03/03/2020 08:55   [place] CORE BIOPSY (LYMPH NODES)  Result Date: 03/09/2020 INDICATION: Right supraclavicular adenopathy EXAM: ULTRASOUND RIGHT SUPRACLAVICULAR ADENOPATHY CORE BIOPSY MEDICATIONS: 1% LIDOCAINE LOCAL ANESTHESIA/SEDATION: Moderate (conscious) sedation was employed during this procedure. A total of Versed 0.5 mg and Fentanyl 25 mcg was administered intravenously. Moderate Sedation Time: 10  minutes. The patient's level of consciousness and vital signs were monitored continuously by radiology nursing throughout the procedure under my direct supervision. FLUOROSCOPY TIME:  Fluoroscopy Time: NONE. COMPLICATIONS: None immediate. PROCEDURE: Informed written consent was obtained from the patient after a thorough discussion of the procedural risks, benefits and alternatives. All questions were addressed. Maximal Sterile Barrier Technique was utilized including caps, mask, sterile gowns, sterile gloves, sterile drape, hand hygiene and skin antiseptic. A timeout was performed prior to the initiation of the procedure. Previous imaging reviewed. Preliminary ultrasound performed. Right supraclavicular adenopathy was localized and marked for biopsy. Under sterile conditions and local anesthesia, an 18 gauge core biopsy needle was advanced into the  right supraclavicular adenopathy. 18 gauge core biopsies obtained. Needle position confirmed with ultrasound. Images obtained for documentation. Core biopsies placed in saline. These were intact and non fragmented. Postprocedure imaging demonstrates no hemorrhage or hematoma. Patient tolerated biopsy well. IMPRESSION: Successful ultrasound right supraclavicular adenopathy 18 gauge core biopsies Electronically Signed   By: Jerilynn Mages.  Shick M.D.   On: 03/09/2020 16:05   US Abdomen Limited RUQ (LIVER/GB)  Result Date: 03/05/2020 CLINICAL DATA:  Elevated LFTs, history COPD, type II diabetes mellitus, hypertension EXAM: ULTRASOUND ABDOMEN LIMITED RIGHT UPPER QUADRANT COMPARISON:  CT abdomen and pelvis 02/23/2020 FINDINGS: Gallbladder: Surgically absent Common bile duct: Diameter: 2 mm, normal Liver: Heterogeneous hepatic echogenicity with nodular margins consistent with cirrhosis. No discrete hepatic mass. Portal vein is patent on color Doppler imaging with normal direction of blood flow towards the liver. Other: Small amount of ascites. Image quality degraded secondary to body  habitus. IMPRESSION: Post cholecystectomy. Cirrhotic appearing liver without mass. Small amount of ascites. Electronically Signed   By: Lavonia Dana M.D.   On: 03/05/2020 11:01    Microbiology Recent Results (from the past 240 hour(s))  Resp Panel by RT-PCR (Flu A&B, Covid) Nasopharyngeal Swab     Status: None   Collection Time: 03/10/2020  8:27 AM   Specimen: Nasopharyngeal Swab; Nasopharyngeal(NP) swabs in vial transport medium  Result Value Ref Range Status   SARS Coronavirus 2 by RT PCR NEGATIVE NEGATIVE Final    Comment: (NOTE) SARS-CoV-2 target nucleic acids are NOT DETECTED.  The SARS-CoV-2 RNA is generally detectable in upper respiratory specimens during the acute phase of infection. The lowest concentration of SARS-CoV-2 viral copies this assay can detect is 138 copies/mL. A negative result does not preclude SARS-Cov-2 infection and should not be used as the sole basis for treatment or other patient management decisions. A negative result may occur with  improper specimen collection/handling, submission of specimen other than nasopharyngeal swab, presence of viral mutation(s) within the areas targeted by this assay, and inadequate number of viral copies(<138 copies/mL). A negative result must be combined with clinical observations, patient history, and epidemiological information. The expected result is Negative.  Fact Sheet for Patients:  EntrepreneurPulse.com.au  Fact Sheet for Healthcare Providers:  IncredibleEmployment.be  This test is no t yet approved or cleared by the [place] FDA and  has been authorized for detection and/or diagnosis of SARS-CoV-2 by FDA under an Emergency Use Authorization (EUA). This EUA will remain  in effect (meaning this test can be used) for the duration of the COVID-19 declaration under Section 564(b)(1) of the Act, 21 U.S.C.section 360bbb-3(b)(1), unless the authorization is terminated  or revoked  sooner.       Influenza A by PCR NEGATIVE NEGATIVE Final   Influenza B by PCR NEGATIVE NEGATIVE Final    Comment: (NOTE) The Xpert Xpress SARS-CoV-2/FLU/RSV plus assay is intended as an aid in the diagnosis of influenza from Nasopharyngeal swab specimens and should not be used as a sole basis for treatment. Nasal washings and aspirates are unacceptable for Xpert Xpress SARS-CoV-2/FLU/RSV testing.  Fact Sheet for Patients: EntrepreneurPulse.com.au  Fact Sheet for Healthcare Providers: IncredibleEmployment.be  This test is not yet approved or cleared by the [place] FDA and has been authorized for detection and/or diagnosis of SARS-CoV-2 by FDA under an Emergency Use Authorization (EUA). This EUA will remain in effect (meaning this test can be used) for the duration of the COVID-19 declaration under Section 564(b)(1) of the Act, 21 U.S.C. section 360bbb-3(b)(1), unless the authorization is  terminated or revoked.  Performed at [facility], [place] [address], [place], [place] [address]     Lab Basic Metabolic Panel: Recent Labs  Lab 02/22/2020 0800 02/21/2020 1000 02/29/2020 1600 03/05/20 0413 03/06/20 0522 03/07/20 0240 03/08/20 0041 03/09/20 0942 03/10/20 0110 03/10/20 1818 03/10/20 1850 April 03, 2020 0028 2020/04/03 0145 2020-04-03 0158 April 03, 2020 0530  NA 143  --    < > 141 142   < > 144 142 142 140 139 137 143 138 143  K 2.0*  --    < > 3.2* 2.5*   < > 3.3* 4.3 4.3 4.7 4.6 6.3* 6.4* 7.0* 6.2*  CL 97*  --    < > 100 99   < > 101 100 101 100  --   --  102  --   --   CO2 32  --    < > 28 31   < > 33* _0 --   --  18*  --   --   GLUCOSE 99  --    < > 133* 73   < > 144* 132* 107* 90  --   --  88  --   --   BUN 33*  --    < > 43* 44*   < > 38* 34* 39* 47*  --   --  51*  --   --   CREATININE 1.14*  --    < > 1.18* 0.92   < > 0.92 0.82 0.83 1.37*  --   --  2.01*  --   --   CALCIUM 9.0  --    < > 8.7* 8.5*   < > 9.6 9.4 9.5 9.3  --    --  9.8  --   --   MG 2.3  --   --  2.3 2.2  --   --   --  2.5*  --   --   --   --   --   --   PHOS  --  2.2*  --  3.7 3.0  --   --   --  2.8  --   --   --  7.0*  --   --    < > = values in this interval not displayed.   Liver Function Tests: Recent Labs  Lab 03/08/20 0041 03/09/20 0128 03/10/20 0110 03/10/20 1818 2020-04-03 0145  AST 166* 153* 131* 185* 844*  ALT 147* 134* 121* 130* 279*  ALKPHOS 186* 212* 197* 178* 155*  BILITOT 4.5* 5.2* 5.8* 5.6* 4.8*  PROT 5.2* 5.0* 4.8* 4.3* 3.4*  ALBUMIN 2.9* 2.6* 2.3* 1.9* 1.5*   Recent Labs  Lab 03/07/2020 0800 03/10/20 1818 04/03/20 0141  LIPASE 41 23  --   AMYLASE  --   --  62   Recent Labs  Lab 03/10/20 1145  AMMONIA 12   CBC: Recent Labs  Lab 03/10/2020 0800 03/06/20 0522 03/07/20 0240 03/08/20 0041 03/09/20 0942 03/10/20 0110 03/10/20 1850 03/10/20 2002 04/03/2020 0028 04/03/2020 0145 Apr 03, 2020 0158 April 03, 2020 0530  WBC 12.2* 6.8 7.0 6.3 7.9 7.3  --  5.7  --  4.3  --   --   NEUTROABS 11.1* 5.9 6.0 5.6  --   --   --   --   --   --   --   --   HGB 12.8 10.5* 11.0* 10.3* 10.5* 10.6*   < > 11.1* 11.9* 10.4* 11.2* 10.2*  HCT 42.2 34.1* 34.1*  33.7* 33.0* 33.3*   < > 37.2 35.0* 35.6* 33.0* 30.0*  MCV 78.4* 77.9* 77.1* 79.5* 78.8* 78.4*  --  81.4  --  84.4  --   --   PLT 74* 44* 42* 43* 40* 40*  --  49*  --  62*  --   --    < > = values in this interval not displayed.   Cardiac Enzymes: Recent Labs  Lab 02/27/2020 1728  CKTOTAL 123   Sepsis Labs: Recent Labs  Lab 03/09/20 0942 03/10/20 0110 03/10/20 1631 03/10/20 1818 03/10/20 2002 03/12/2020 0142 Mar 12, 2020 0145 2020/03/12 0548  PROCALCITON  --   --   --  2.52  --   --  6.91  --   WBC 7.9 7.3  --   --  5.7  --  4.3  --   LATICACIDVEN  --   --  7.5* 8.1*  --  >11.0*  --  >11.0*    Procedures/Operations  03/09/20 lymph node biopsy 03/10/20 intubation 12-Mar-2020 central line insertion 2020/03/12 arterial line insertion    Eliseo Gum MSN, AGACNP-BC [place] March 12, 2020, 8:54 AM

## 2020-03-21 NOTE — Progress Notes (Signed)
Patient pronounced at [phone] by Loleta Dicker RN, Ronalee Belts RN. HR 0, RR 0. Family notified.

## 2020-03-21 NOTE — Progress Notes (Addendum)
[facility] Progress Note Patient Name: [name] [name] DOB: 14-Aug-1955 MRN: [id number]   Date of Service  2020/04/08  HPI/Events of Note  Multiple issues: 1. ABG on 100%/PRVC 32/TV 470/P 5 = 7.228/50.0/212 and 2. Hyperkalemia - K+ = 6.4. T waves not peaked on bedside monitor.  eICU Interventions  Plan. 1. NaHCO3  100 meq IV now.  2. NaHCO3 IV infusion at 75 mL/hour.  3. Calcium gluconate 1 gm IV now.  4. Lokelma 10 gm per tube now. 5. Repeat BMP at 9:30 AM.  6.. Repeat ABG at 7:30 AM.     Intervention Category Major Interventions: Acid-Base disturbance - evaluation and management;Respiratory failure - evaluation and management;Electrolyte abnormality - evaluation and management  Taniaya Rudder Eugene 2020-04-08, 3:31 AM

## 2020-03-21 NOTE — Progress Notes (Signed)
[facility] Progress Note Patient Name: [name] [name] DOB: 11-23-1955 MRN: [id number]   Date of Service  03-26-2020  HPI/Events of Note  Multiple issues: 1. Hypotension - BP = 66/40 with MAP = 50. 2. Bradycardia - HR dropped to 48 and 3. ABG on 100%/PRVC 20/TV 470/P 5 = 7.129/55.6/68.  eICU Interventions  Plan: 1. NaHCO3 100 meq IV now. 2. Increase PRVC rate to 32. 3. Repeat ABG at 2 AM. 4. Add Norepinephrine IV infusion via PIV. Titrate to MAP >= 65.      Intervention Category Major Interventions: Acid-Base disturbance - evaluation and management;Respiratory failure - evaluation and management;Arrhythmia - evaluation and management  Tekeyah Santiago Cornelia Copa 2020-03-26, 12:38 AM

## 2020-03-21 NOTE — Progress Notes (Signed)
Pharmacy Antibiotic Note  [name] [name] is a 65 y.o. female admitted on 03/20/2020 with shortness of breath.  Pharmacy has been consulted for Vancomycin/Cefepime dosing for septic shock. Pt decompensated this evening and required transfer to the ICU and intubation. She remains hypotensive.   Plan: Vancomycin 2000 mg IV x 1, then 1500 mg IV q24h Cefepime 2g IV q12h Trend WBC, temp, renal function  F/U infectious work-up Drug levels as indicated   Height: 5\' 6"  (167.6 cm) Weight: 124.9 kg (275 lb 5.7 oz) IBW/kg (Calculated) : 59.3  Temp (24hrs), Avg:98.3 F (36.8 C), Min:98 F (36.7 C), Max:99.3 F (37.4 C)  Recent Labs  Lab 03/07/20 0240 03/08/20 0041 03/09/20 0942 03/10/20 0110 03/10/20 1631 03/10/20 1818 03/10/20 2002  WBC 7.0 6.3 7.9 7.3  --   --  5.7  CREATININE 1.02* 0.92 0.82 0.83  --  1.37*  --   LATICACIDVEN  --   --   --   --  7.5* 8.1*  --     Estimated Creatinine Clearance: 54.5 mL/min (A) (by C-G formula based on SCr of 1.37 mg/dL (H)).    Allergies  Allergen Reactions  . Iohexol Rash    Pt states she had a rash 35-40 yrs ago during a procedure.  Benadryl was given and pt was fine in 30 mins. She has had multiple CT's since with premeds and has done fine.  No anaphylaxis per pt. I updated this record. [name], RTRCT  03/06/17  . Penicillins Rash and Other (See Comments)    PATIENT HAS HAD A PCN REACTION WITH IMMEDIATE RASH, FACIAL/TONGUE/THROAT SWELLING, SOB, OR LIGHTHEADEDNESS WITH HYPOTENSION:  #  #  YES  #  #  Has patient had a PCN reaction causing severe rash involving mucus membranes or skin necrosis: No Has patient had a PCN reaction that required hospitalization: No Has patient had a PCN reaction occurring within the last 10 years: No Can take amoxicillin     . Levaquin [Levofloxacin] Hives and Rash  . Morphine And Related Nausea And Vomiting    Makes her crawl out of her skin  . Sulfa Antibiotics Rash   Narda Bonds, PharmD,  BCPS Clinical Pharmacist Phone: [phone]

## 2020-03-21 NOTE — Op Note (Signed)
Central Venous Catheter Insertion Procedure Note  [name] [name]  [id number]  [birth year]  Date:16-Mar-2020  Time:1:39 AM   Provider Performing:Channelle Bottger R Dalyn Kjos   Procedure: Insertion of Non-tunneled Central Venous Catheter(36556) without US guidance  Indication(s) Medication administration  Consent Unable to obtain consent due to emergent nature of procedure.  Anesthesia Topical only with 1% lidocaine   Timeout Verified patient identification, verified procedure, site/side was marked, verified correct patient position, special equipment/implants available, medications/allergies/relevant history reviewed, required imaging and test results available.  Sterile Technique Maximal sterile technique including full sterile barrier drape, hand hygiene, sterile gown, sterile gloves, mask, hair covering, sterile ultrasound probe cover (if used).  Procedure Description Area of catheter insertion was cleaned with chlorhexidine and draped in sterile fashion.   central venous catheter was placed into the left subclavian vein on first attempt without difficulty using the schlesinger technique.  Nonpulsatile blood flow and easy flushing noted in all ports.  The catheter was sutured in place and sterile dressing applied.  Complications/Tolerance None; patient tolerated the procedure well. Chest X-ray is ordered to verify placement for internal jugular or subclavian cannulation.   Chest x-ray is not ordered for femoral cannulation.  EBL Minimal  Specimen(s) None

## 2020-03-21 NOTE — Progress Notes (Signed)
[facility] Progress Note Patient Name: [name] [name] DOB: July 09, 1955 MRN: [id number]   Date of Service  2020/03/17  HPI/Events of Note  Hypotension - BP = 68/41 with MAP = 49. Now in AFIB with RVR - ventricular rate = 145. She is allergic to Iohexol and, therefore, Amiodarone. ABG = 7.176/62.6/77. She is failing heroic levels of support and will not survive.   eICU Interventions  Plan: 1. NaHOC3 100 meq IV now. 2. Increase INaHCO3 V infusion to 150 mL/hour. 3. Digoxin 0.5 mg IV now.      Intervention Category Major Interventions: Arrhythmia - evaluation and management;Hypotension - evaluation and management;Acid-Base disturbance - evaluation and management  Lysle Dingwall 17-Mar-2020, 5:43 AM

## 2020-03-21 NOTE — Progress Notes (Signed)
PCCM interval progress note:  Pt with worsening shock despite three vasopressors, bicarb, antibiotics and stress dose steroids.    Discussed overall prognosis with patient's daughter, she states that she does not want patient to suffer and understands chest compressions would not be helpful.  She would like to change code status to DNR and focus on her mother's comfort, will make appropriate order changes.   Otilio Carpen Heidemarie Goodnow, PA-C

## 2020-03-21 NOTE — Progress Notes (Signed)
[facility] Progress Note Patient Name: [name] [name] DOB: October 01, 1955 MRN: [id number]   Date of Service  03/24/2020  HPI/Events of Note  Hypotension - BP = 70/36 with MAP = 46 and 2. Hypoglycemia - Blood  Glucose = 66.   eICU Interventions  Plan: 1. NaHCO3 100 meq IV X 1 now.  2. Increase D10W IV infusion to 80 mL/hour.      Intervention Category Major Interventions: Hypotension - evaluation and management;Other:  Lysle Dingwall March 24, 2020, 5:11 AM

## 2020-03-21 NOTE — Progress Notes (Addendum)
[facility] Progress Note Patient Name: [name] [name] DOB: August 27, 1955 MRN: [id number]   Date of Service  03-29-2020  HPI/Events of Note  Hypotension - BP = 46/32 in spite of Norepinephrine, Phenylephrine and Vasopressin IV infusions at ceiling doses.   eICU Interventions  Plan: 1. NaHCO3 200 meq IV now.  2. Increase NaHCO3 IV infusion to 125 mL/hour. 3. Increase ceiling on Norepinephrine IV infusion to 80 mcg/miun.     Intervention Category Major Interventions: Hypotension - evaluation and management  [name] [name] 2020-03-29, 4:16 AM

## 2020-03-21 DEATH — deceased

## 2020-04-09 ENCOUNTER — Encounter (HOSPITAL_BASED_OUTPATIENT_CLINIC_OR_DEPARTMENT_OTHER): Payer: Medicare Other | Admitting: Pulmonary Disease

## 2020-06-29 ENCOUNTER — Encounter (HOSPITAL_COMMUNITY): Payer: Medicare Other

## 2020-06-29 ENCOUNTER — Ambulatory Visit: Payer: Medicare Other

## 2021-06-14 IMAGING — CT CT ANGIO CHEST
2 of 6 series · 17 of 46 positions shown · IV contrast (APPLIED)
Comparison: Chest radiograph 11/02/2019; chest CT 06/05/2012.

CLINICAL DATA: Patient with cough and shortness of breath. Evaluate
for pulmonary embolus.

EXAM:
CT ANGIOGRAPHY CHEST WITH CONTRAST
TECHNIQUE: Multidetector CT imaging of the chest was performed using the
standard protocol during bolus administration of intravenous
contrast. Multiplanar CT image reconstructions and MIPs were
obtained to evaluate the vascular anatomy.
CONTRAST:  75mL OMNIPAQUE IOHEXOL 350 MG/ML SOLN

[Series 6: thins · axial · 0.87mm/px · z∈[+1096,+1348]mm · 14 of 278 slices shown]
[im 13/278  lung]
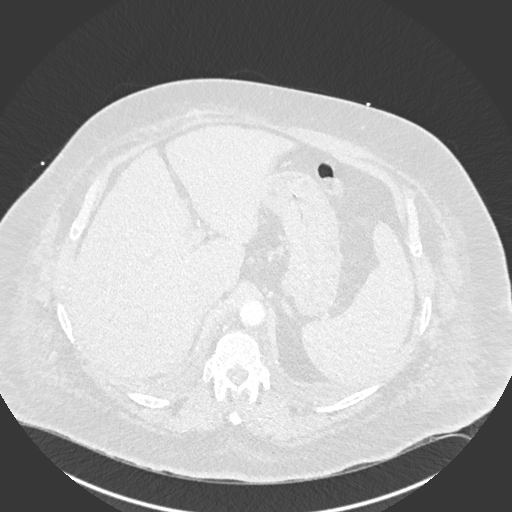
[im 37/278  soft-tissue]
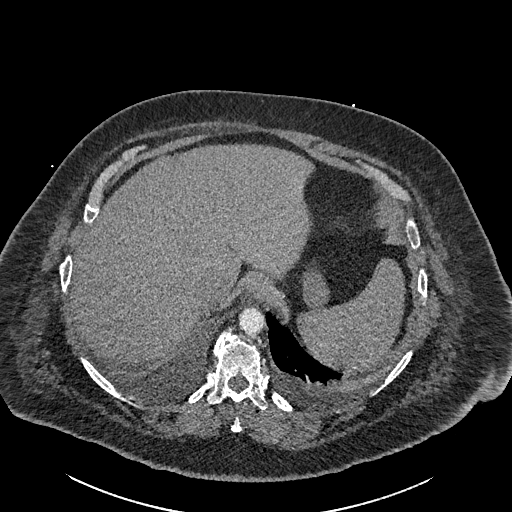
[im 49/278  lung]
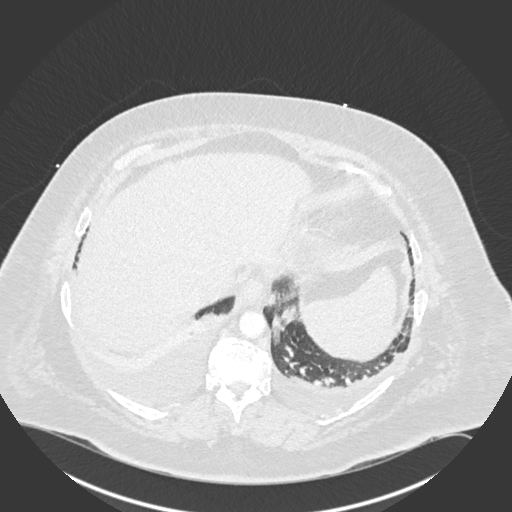
[im 73/278  soft-tissue]
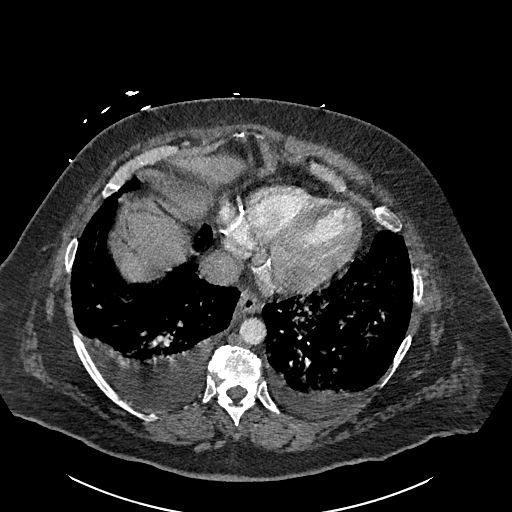
[im 97/278  lung]
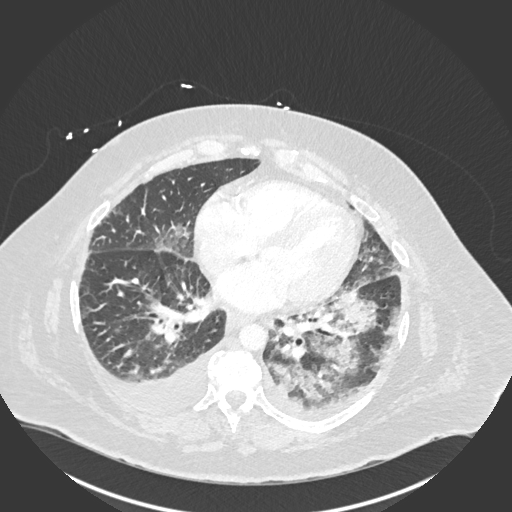
[im 109/278  soft-tissue]
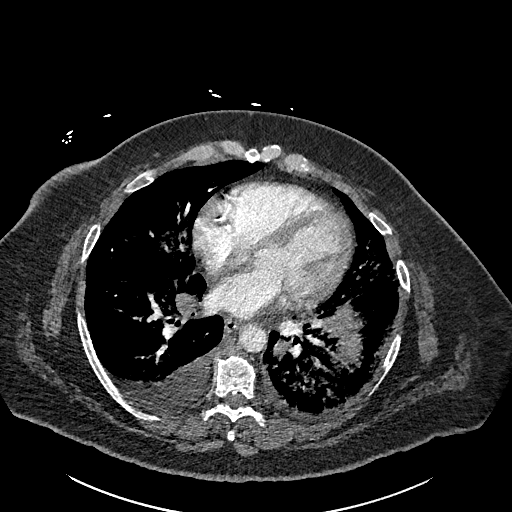
[im 133/278  lung]
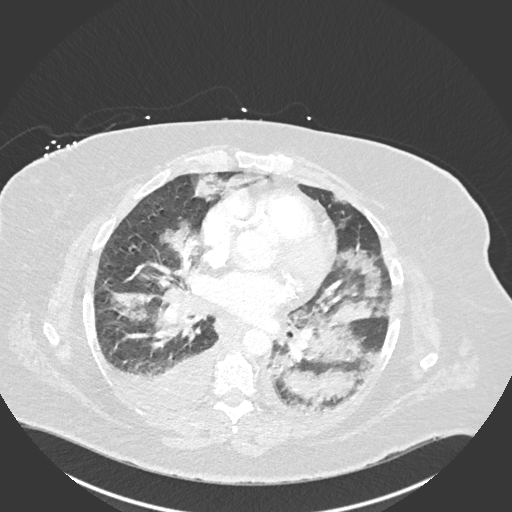
[im 145/278  soft-tissue]
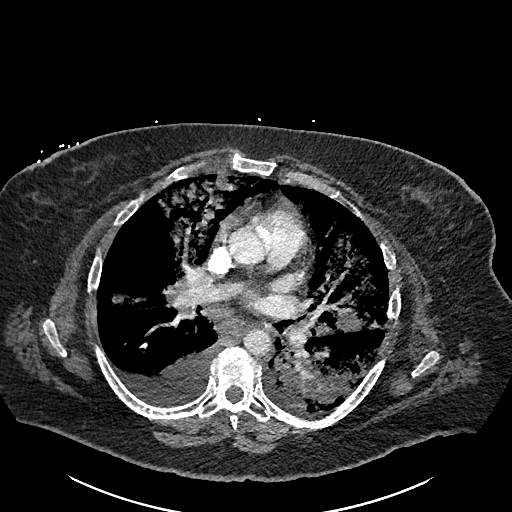
[im 169/278  lung]
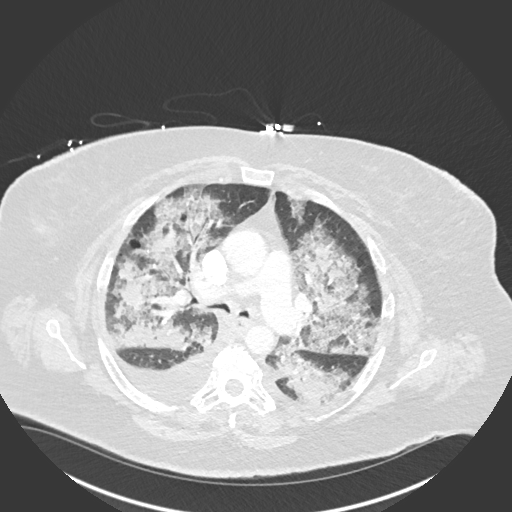
[im 181/278  soft-tissue]
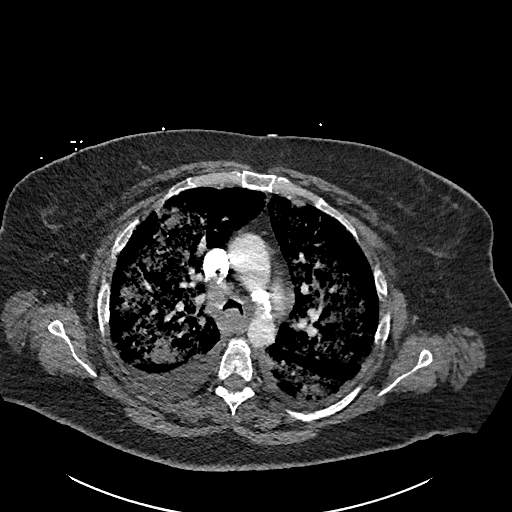
[im 205/278  lung]
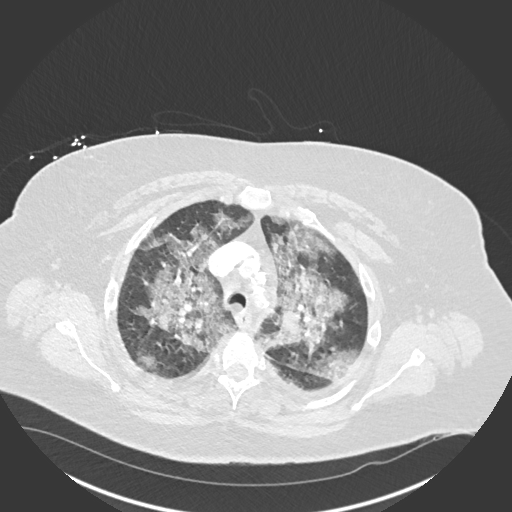
[im 229/278  soft-tissue]
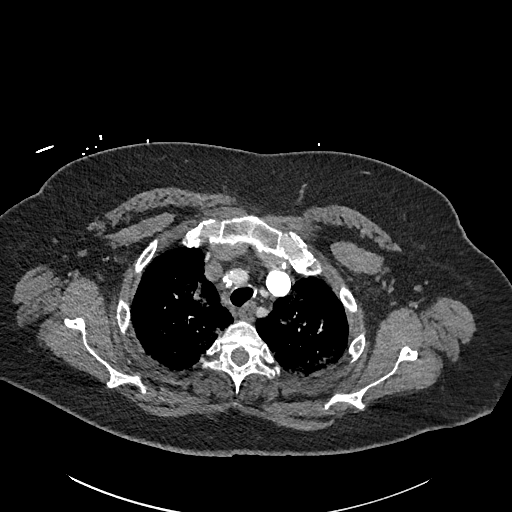
[im 241/278  lung]
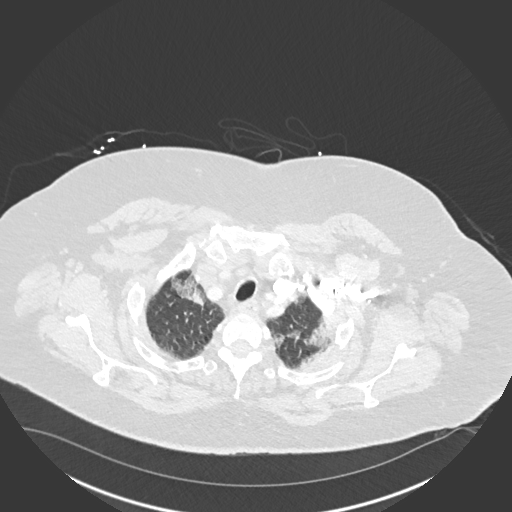
[im 265/278  soft-tissue]
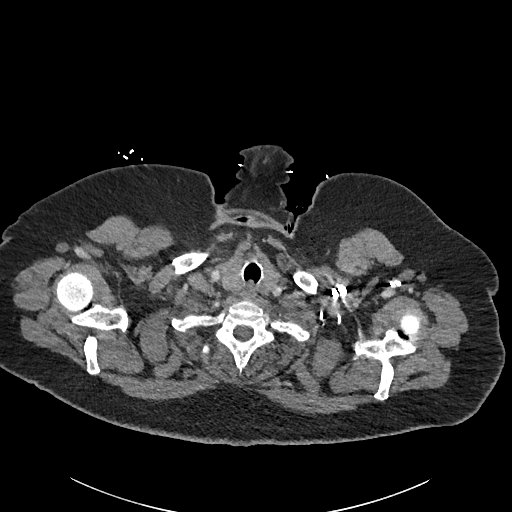

[Series 8: coronal mpr · coronal · 0.59mm/px · 3 of 153 slices shown]
[im 39/153  soft-tissue]
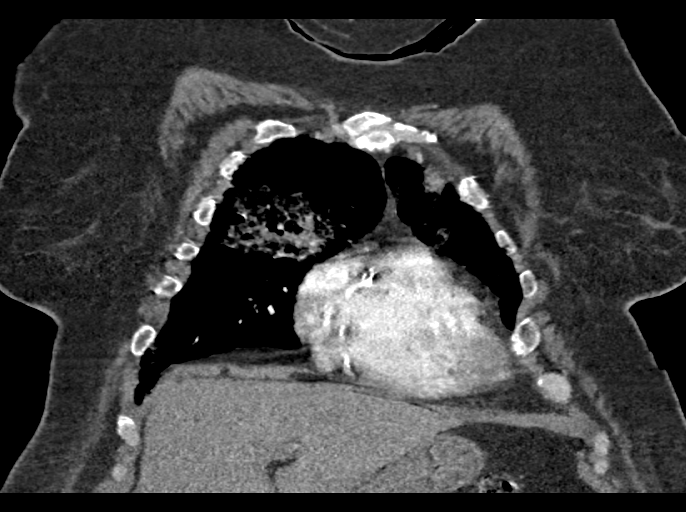
[im 77/153  soft-tissue]
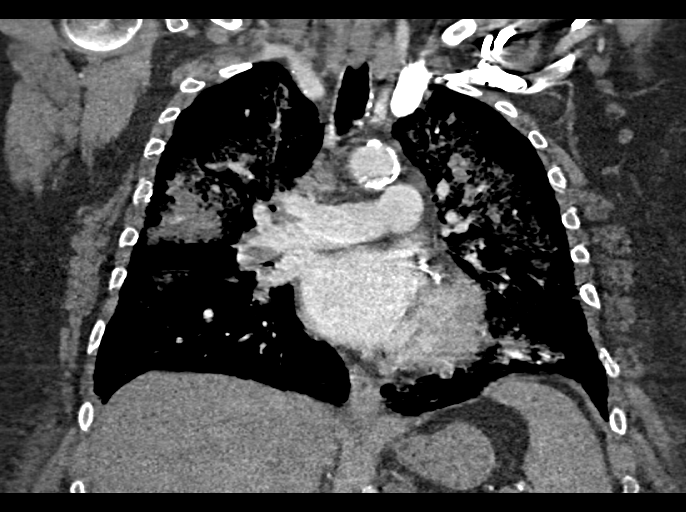
[im 115/153  soft-tissue]
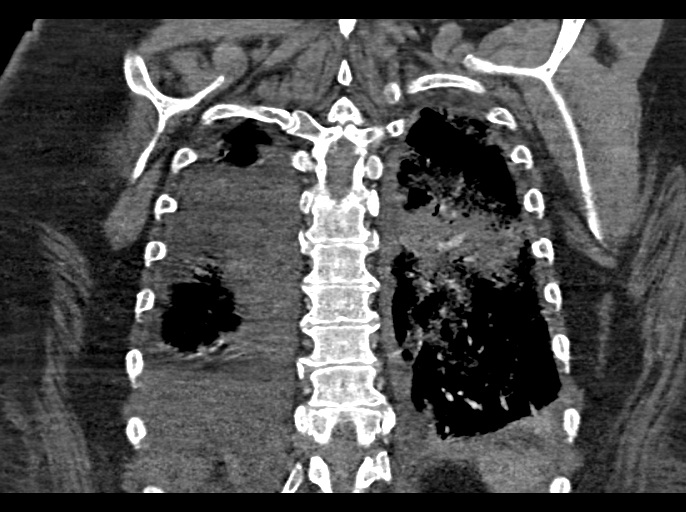

[17 of 46 positions shown; findings below may reference images not displayed]

FINDINGS: Cardiovascular: Normal heart size. Thoracic aortic vascular
calcifications. Coronary arterial vascular calcifications.
Examination limited secondary to marked motion artifact and low
attenuation of the bolus. No evidence for central, main or lobar
pulmonary embolus. Evaluation of the segmental and subsegmental
pulmonary arteries is limited due to artifact.

Mediastinum/Nodes: No axillary adenopathy. Multiple prominent and
enlarged mediastinal nodes are demonstrated including a 10 mm right
paratracheal node (image 22; series 5) and a 1.9 cm inferior right
hilar node (image 52; series). Normal appearance of the esophagus.

Lungs/Pleura: X [REDACTED] phase imaging. Fairly extensive ground-glass
and consolidative opacities demonstrated throughout the lungs
bilaterally. Moderate right and small left pleural effusions. No
pneumothorax.

Upper Abdomen: Unremarkable.

Musculoskeletal: Thoracic spine degenerative changes. No aggressive
or acute appearing osseous lesions.

Review of the MIP images confirms the above findings.
IMPRESSION: 1. No evidence for central, main or lobar pulmonary embolus.
Evaluation of the segmental and subsegmental pulmonary arteries is
limited due to artifact.
2. Fairly extensive ground-glass and consolidative opacities
throughout the lungs bilaterally which may represent multifocal
infection or potentially superimposed edema.
3. Moderate right and small left pleural effusions.
4. Enlarged mediastinal and hilar nodes which may be reactive in
etiology.
5. Aortic atherosclerosis.
